# Patient Record
Sex: Female | Born: 1970
Health system: Southern US, Community
[De-identification: ages and names within clinical notes are randomized; demographics above are authoritative.]

## PROBLEM LIST (undated history)

## (undated) DIAGNOSIS — Z8614 Personal history of Methicillin resistant Staphylococcus aureus infection: Secondary | ICD-10-CM

## (undated) DIAGNOSIS — F329 Major depressive disorder, single episode, unspecified: Secondary | ICD-10-CM

## (undated) DIAGNOSIS — N390 Urinary tract infection, site not specified: Secondary | ICD-10-CM

## (undated) DIAGNOSIS — Z889 Allergy status to unspecified drugs, medicaments and biological substances status: Secondary | ICD-10-CM

## (undated) DIAGNOSIS — Q2542 Hypoplasia of aorta: Secondary | ICD-10-CM

## (undated) DIAGNOSIS — J309 Allergic rhinitis, unspecified: Secondary | ICD-10-CM

## (undated) DIAGNOSIS — J189 Pneumonia, unspecified organism: Secondary | ICD-10-CM

## (undated) DIAGNOSIS — J45909 Unspecified asthma, uncomplicated: Secondary | ICD-10-CM

## (undated) DIAGNOSIS — N926 Irregular menstruation, unspecified: Secondary | ICD-10-CM

## (undated) DIAGNOSIS — T4145XA Adverse effect of unspecified anesthetic, initial encounter: Secondary | ICD-10-CM

## (undated) DIAGNOSIS — R7303 Prediabetes: Secondary | ICD-10-CM

## (undated) DIAGNOSIS — T8859XA Other complications of anesthesia, initial encounter: Secondary | ICD-10-CM

## (undated) DIAGNOSIS — R011 Cardiac murmur, unspecified: Secondary | ICD-10-CM

## (undated) DIAGNOSIS — Q210 Ventricular septal defect: Secondary | ICD-10-CM

## (undated) DIAGNOSIS — G43909 Migraine, unspecified, not intractable, without status migrainosus: Secondary | ICD-10-CM

## (undated) DIAGNOSIS — F32A Depression, unspecified: Secondary | ICD-10-CM

## (undated) DIAGNOSIS — Z789 Other specified health status: Secondary | ICD-10-CM

## (undated) DIAGNOSIS — F419 Anxiety disorder, unspecified: Secondary | ICD-10-CM

## (undated) DIAGNOSIS — L500 Allergic urticaria: Secondary | ICD-10-CM

## (undated) DIAGNOSIS — K219 Gastro-esophageal reflux disease without esophagitis: Secondary | ICD-10-CM

## (undated) DIAGNOSIS — R635 Abnormal weight gain: Secondary | ICD-10-CM

## (undated) DIAGNOSIS — C801 Malignant (primary) neoplasm, unspecified: Secondary | ICD-10-CM

## (undated) DIAGNOSIS — E039 Hypothyroidism, unspecified: Secondary | ICD-10-CM

## (undated) DIAGNOSIS — R42 Dizziness and giddiness: Secondary | ICD-10-CM

## (undated) DIAGNOSIS — E663 Overweight: Secondary | ICD-10-CM

## (undated) HISTORY — PX: TONSILLECTOMY: SUR1361

## (undated) HISTORY — PX: CORONARY ANGIOPLASTY: SHX604

## (undated) HISTORY — DX: Hypoplasia of aorta: Q25.42

## (undated) HISTORY — DX: Overweight: E66.3

## (undated) HISTORY — DX: Anxiety disorder, unspecified: F41.9

## (undated) HISTORY — PX: COLONOSCOPY: SHX174

## (undated) HISTORY — DX: Allergic rhinitis, unspecified: J30.9

## (undated) HISTORY — DX: Ventricular septal defect: Q21.0

## (undated) HISTORY — DX: Dizziness and giddiness: R42

## (undated) HISTORY — DX: Depression, unspecified: F32.A

## (undated) HISTORY — DX: Migraine, unspecified, not intractable, without status migrainosus: G43.909

## (undated) HISTORY — DX: Other specified health status: Z78.9

## (undated) HISTORY — PX: NASAL SINUS SURGERY: SHX719

## (undated) HISTORY — DX: Urinary tract infection, site not specified: N39.0

## (undated) HISTORY — DX: Cardiac murmur, unspecified: R01.1

## (undated) HISTORY — DX: Allergic urticaria: L50.0

## (undated) HISTORY — PX: CYSTOSCOPY WITH HYDRODISTENSION AND BIOPSY: SHX5127

## (undated) HISTORY — DX: Irregular menstruation, unspecified: N92.6

## (undated) HISTORY — DX: Abnormal weight gain: R63.5

## (undated) HISTORY — PX: COSMETIC SURGERY: SHX468

## (undated) HISTORY — DX: Gastro-esophageal reflux disease without esophagitis: K21.9

## (undated) HISTORY — PX: ESOPHAGOGASTRODUODENOSCOPY: SHX1529

## (undated) HISTORY — DX: Major depressive disorder, single episode, unspecified: F32.9

## (undated) HISTORY — PX: ABDOMINAL HYSTERECTOMY: SHX81

---

## 1999-03-14 ENCOUNTER — Encounter (HOSPITAL_COMMUNITY): Admission: RE | Admit: 1999-03-14 | Discharge: 1999-06-12 | Payer: Self-pay | Admitting: Neurology

## 2001-05-23 ENCOUNTER — Other Ambulatory Visit: Admission: RE | Admit: 2001-05-23 | Discharge: 2001-05-23 | Payer: Self-pay | Admitting: Obstetrics & Gynecology

## 2001-11-12 HISTORY — PX: TUBAL LIGATION: SHX77

## 2001-11-15 ENCOUNTER — Inpatient Hospital Stay (HOSPITAL_COMMUNITY): Admission: AD | Admit: 2001-11-15 | Discharge: 2001-11-15 | Payer: Self-pay | Admitting: Obstetrics and Gynecology

## 2001-12-04 ENCOUNTER — Inpatient Hospital Stay (HOSPITAL_COMMUNITY): Admission: AD | Admit: 2001-12-04 | Discharge: 2001-12-04 | Payer: Self-pay | Admitting: Obstetrics & Gynecology

## 2001-12-05 ENCOUNTER — Inpatient Hospital Stay (HOSPITAL_COMMUNITY): Admission: AD | Admit: 2001-12-05 | Discharge: 2001-12-05 | Payer: Self-pay | Admitting: *Deleted

## 2001-12-31 ENCOUNTER — Inpatient Hospital Stay (HOSPITAL_COMMUNITY): Admission: AD | Admit: 2001-12-31 | Discharge: 2001-12-31 | Payer: Self-pay | Admitting: *Deleted

## 2002-01-02 ENCOUNTER — Inpatient Hospital Stay (HOSPITAL_COMMUNITY): Admission: AD | Admit: 2002-01-02 | Discharge: 2002-01-05 | Payer: Self-pay | Admitting: Obstetrics and Gynecology

## 2002-01-08 ENCOUNTER — Inpatient Hospital Stay (HOSPITAL_COMMUNITY): Admission: AD | Admit: 2002-01-08 | Discharge: 2002-01-10 | Payer: Self-pay | Admitting: Obstetrics & Gynecology

## 2002-02-02 ENCOUNTER — Other Ambulatory Visit: Admission: RE | Admit: 2002-02-02 | Discharge: 2002-02-02 | Payer: Self-pay | Admitting: Obstetrics and Gynecology

## 2003-11-10 ENCOUNTER — Other Ambulatory Visit: Payer: Self-pay

## 2003-12-04 ENCOUNTER — Other Ambulatory Visit: Payer: Self-pay

## 2004-09-21 ENCOUNTER — Emergency Department: Payer: Self-pay | Admitting: Emergency Medicine

## 2004-11-12 HISTORY — PX: COLONOSCOPY: SHX174

## 2004-12-21 ENCOUNTER — Emergency Department: Payer: Self-pay | Admitting: Emergency Medicine

## 2005-12-26 ENCOUNTER — Emergency Department: Payer: Self-pay | Admitting: Unknown Physician Specialty

## 2006-07-25 ENCOUNTER — Ambulatory Visit: Payer: Self-pay

## 2007-05-11 ENCOUNTER — Emergency Department: Payer: Self-pay | Admitting: Emergency Medicine

## 2007-11-27 ENCOUNTER — Ambulatory Visit: Payer: Self-pay | Admitting: Family Medicine

## 2007-11-28 ENCOUNTER — Emergency Department: Payer: Self-pay | Admitting: Unknown Physician Specialty

## 2007-12-24 ENCOUNTER — Ambulatory Visit: Payer: Self-pay

## 2011-09-29 ENCOUNTER — Emergency Department: Payer: Self-pay | Admitting: Emergency Medicine

## 2011-11-13 HISTORY — PX: CORONARY ANGIOPLASTY: SHX604

## 2012-01-22 ENCOUNTER — Emergency Department: Payer: Self-pay | Admitting: Unknown Physician Specialty

## 2012-01-22 LAB — COMPREHENSIVE METABOLIC PANEL
Albumin: 3.9 g/dL (ref 3.4–5.0)
Anion Gap: 13 (ref 7–16)
BUN: 10 mg/dL (ref 7–18)
Calcium, Total: 8.4 mg/dL — ABNORMAL LOW (ref 8.5–10.1)
Chloride: 104 mmol/L (ref 98–107)
Co2: 22 mmol/L (ref 21–32)
Creatinine: 0.71 mg/dL (ref 0.60–1.30)
EGFR (Non-African Amer.): >60
Glucose: 81 mg/dL (ref 65–99)
Osmolality: 276 (ref 275–301)
Potassium: 4.1 mmol/L (ref 3.5–5.1)
SGPT (ALT): 26 U/L
Sodium: 139 mmol/L (ref 136–145)

## 2012-01-22 LAB — CBC
HCT: 40.1 % (ref 35.0–47.0)
HGB: 13.3 g/dL (ref 12.0–16.0)
MCH: 29.1 pg (ref 26.0–34.0)
MCHC: 33.1 g/dL (ref 32.0–36.0)
MCV: 88 fL (ref 80–100)
RDW: 12.5 % (ref 11.5–14.5)

## 2012-01-22 LAB — CK TOTAL AND CKMB (NOT AT ARMC): CK-MB: 2.4 ng/mL (ref 0.5–3.6)

## 2012-01-22 LAB — PROTIME-INR
INR: 0.9
Prothrombin Time: 12.7 secs (ref 11.5–14.7)

## 2012-08-26 ENCOUNTER — Emergency Department: Payer: Self-pay | Admitting: Emergency Medicine

## 2012-08-26 LAB — COMPREHENSIVE METABOLIC PANEL
Anion Gap: 8 (ref 7–16)
BUN: 10 mg/dL (ref 7–18)
Bilirubin,Total: 0.3 mg/dL (ref 0.2–1.0)
Chloride: 105 mmol/L (ref 98–107)
Creatinine: 0.74 mg/dL (ref 0.60–1.30)
EGFR (African American): >60
Osmolality: 284 (ref 275–301)
Potassium: 3.9 mmol/L (ref 3.5–5.1)
Sodium: 143 mmol/L (ref 136–145)
Total Protein: 7.2 g/dL (ref 6.4–8.2)

## 2012-08-26 LAB — CBC
HCT: 38.4 % (ref 35.0–47.0)
HGB: 13.1 g/dL (ref 12.0–16.0)
MCH: 29.4 pg (ref 26.0–34.0)
MCV: 87 fL (ref 80–100)
Platelet: 307 10*3/uL (ref 150–440)
RBC: 4.44 10*6/uL (ref 3.80–5.20)
WBC: 7.3 10*3/uL (ref 3.6–11.0)

## 2012-08-26 LAB — CK TOTAL AND CKMB (NOT AT ARMC)
CK, Total: 164 U/L (ref 21–215)
CK-MB: 2.2 ng/mL (ref 0.5–3.6)

## 2012-10-27 ENCOUNTER — Emergency Department: Payer: Self-pay | Admitting: Emergency Medicine

## 2012-10-27 LAB — CBC WITH DIFFERENTIAL/PLATELET
Basophil #: 0 10*3/uL (ref 0.0–0.1)
Basophil %: 0.6 %
Eosinophil #: 0.5 10*3/uL (ref 0.0–0.7)
Eosinophil %: 6.9 %
HCT: 39 % (ref 35.0–47.0)
HGB: 13 g/dL (ref 12.0–16.0)
Lymphocyte #: 1.8 10*3/uL (ref 1.0–3.6)
MCH: 29.1 pg (ref 26.0–34.0)
MCHC: 33.4 g/dL (ref 32.0–36.0)
MCV: 87 fL (ref 80–100)
Monocyte #: 0.6 x10 3/mm (ref 0.2–0.9)
Neutrophil #: 4.9 10*3/uL (ref 1.4–6.5)
Neutrophil %: 62.1 %
RDW: 13.2 % (ref 11.5–14.5)

## 2012-10-27 LAB — BASIC METABOLIC PANEL
Anion Gap: 5 — ABNORMAL LOW (ref 7–16)
BUN: 9 mg/dL (ref 7–18)
Calcium, Total: 8.3 mg/dL — ABNORMAL LOW (ref 8.5–10.1)
Co2: 27 mmol/L (ref 21–32)
EGFR (African American): >60
EGFR (Non-African Amer.): >60
Glucose: 81 mg/dL (ref 65–99)
Potassium: 3.8 mmol/L (ref 3.5–5.1)
Sodium: 141 mmol/L (ref 136–145)

## 2012-10-27 LAB — URINALYSIS, COMPLETE
Bilirubin,UR: NEGATIVE
Glucose,UR: NEGATIVE mg/dL (ref 0–75)
Nitrite: NEGATIVE
Protein: NEGATIVE
RBC,UR: 8 /HPF (ref 0–5)
Specific Gravity: 1.015 (ref 1.003–1.030)
Squamous Epithelial: 12
WBC UR: 213 /HPF (ref 0–5)

## 2012-10-27 LAB — TROPONIN I: Troponin-I: <0.02 ng/mL

## 2012-10-29 LAB — URINE CULTURE

## 2013-03-23 ENCOUNTER — Emergency Department: Payer: Self-pay | Admitting: Emergency Medicine

## 2013-03-23 LAB — URINALYSIS, COMPLETE
Bacteria: NONE SEEN
Blood: NEGATIVE
Ketone: NEGATIVE
Leukocyte Esterase: NEGATIVE
Ph: 7 (ref 4.5–8.0)
Protein: NEGATIVE
RBC,UR: 2 /HPF (ref 0–5)
Squamous Epithelial: 1
WBC UR: <1 /HPF (ref 0–5)

## 2013-08-05 DIAGNOSIS — R635 Abnormal weight gain: Secondary | ICD-10-CM

## 2013-08-05 DIAGNOSIS — Q210 Ventricular septal defect: Secondary | ICD-10-CM

## 2013-08-05 HISTORY — DX: Ventricular septal defect: Q21.0

## 2013-08-05 HISTORY — DX: Abnormal weight gain: R63.5

## 2013-08-29 ENCOUNTER — Emergency Department: Payer: Self-pay | Admitting: Internal Medicine

## 2013-09-30 DIAGNOSIS — N390 Urinary tract infection, site not specified: Secondary | ICD-10-CM

## 2013-09-30 DIAGNOSIS — F329 Major depressive disorder, single episode, unspecified: Secondary | ICD-10-CM | POA: Insufficient documentation

## 2013-09-30 DIAGNOSIS — F32A Depression, unspecified: Secondary | ICD-10-CM

## 2013-09-30 DIAGNOSIS — N926 Irregular menstruation, unspecified: Secondary | ICD-10-CM | POA: Insufficient documentation

## 2013-09-30 DIAGNOSIS — E663 Overweight: Secondary | ICD-10-CM

## 2013-09-30 DIAGNOSIS — G43909 Migraine, unspecified, not intractable, without status migrainosus: Secondary | ICD-10-CM

## 2013-09-30 HISTORY — DX: Irregular menstruation, unspecified: N92.6

## 2013-09-30 HISTORY — DX: Depression, unspecified: F32.A

## 2013-09-30 HISTORY — DX: Overweight: E66.3

## 2013-09-30 HISTORY — DX: Urinary tract infection, site not specified: N39.0

## 2013-09-30 HISTORY — DX: Migraine, unspecified, not intractable, without status migrainosus: G43.909

## 2013-11-13 HISTORY — PX: BREAST BIOPSY: SHX20

## 2014-04-11 DIAGNOSIS — Z789 Other specified health status: Secondary | ICD-10-CM

## 2014-04-11 HISTORY — DX: Other specified health status: Z78.9

## 2015-03-11 ENCOUNTER — Other Ambulatory Visit: Payer: Self-pay | Admitting: Nurse Practitioner

## 2015-03-11 DIAGNOSIS — Z1239 Encounter for other screening for malignant neoplasm of breast: Secondary | ICD-10-CM

## 2015-04-04 ENCOUNTER — Ambulatory Visit
Admission: RE | Admit: 2015-04-04 | Discharge: 2015-04-04 | Disposition: A | Discharge status: Home / Self Care | Payer: 59 | Source: Ambulatory Visit | Attending: Nurse Practitioner | Admitting: Nurse Practitioner

## 2015-04-04 ENCOUNTER — Other Ambulatory Visit: Payer: Self-pay | Admitting: Nurse Practitioner

## 2015-04-04 DIAGNOSIS — Z1231 Encounter for screening mammogram for malignant neoplasm of breast: Secondary | ICD-10-CM | POA: Diagnosis present

## 2015-04-04 DIAGNOSIS — Z1239 Encounter for other screening for malignant neoplasm of breast: Secondary | ICD-10-CM

## 2015-04-04 DIAGNOSIS — N63 Unspecified lump in breast: Secondary | ICD-10-CM | POA: Insufficient documentation

## 2015-08-06 ENCOUNTER — Encounter: Payer: Self-pay | Admitting: Emergency Medicine

## 2015-08-06 ENCOUNTER — Emergency Department
Admission: EM | Admit: 2015-08-06 | Discharge: 2015-08-06 | Disposition: A | Discharge status: Home / Self Care | Payer: 59 | Attending: Emergency Medicine | Admitting: Emergency Medicine

## 2015-08-06 DIAGNOSIS — Z880 Allergy status to penicillin: Secondary | ICD-10-CM | POA: Diagnosis not present

## 2015-08-06 DIAGNOSIS — J019 Acute sinusitis, unspecified: Secondary | ICD-10-CM | POA: Diagnosis not present

## 2015-08-06 DIAGNOSIS — Z9109 Other allergy status, other than to drugs and biological substances: Secondary | ICD-10-CM

## 2015-08-06 DIAGNOSIS — J302 Other seasonal allergic rhinitis: Secondary | ICD-10-CM | POA: Diagnosis not present

## 2015-08-06 DIAGNOSIS — H9202 Otalgia, left ear: Secondary | ICD-10-CM

## 2015-08-06 HISTORY — DX: Unspecified asthma, uncomplicated: J45.909

## 2015-08-06 MED ORDER — DOXYCYCLINE HYCLATE 100 MG PO CAPS: 100.0000 mg | ORAL_CAPSULE | Freq: Two times a day (BID) | ORAL | Status: DC

## 2015-08-06 MED ORDER — HYDROCODONE-ACETAMINOPHEN 5-325 MG PO TABS: 1.0000 | ORAL_TABLET | ORAL | Status: DC | PRN

## 2015-08-06 MED ORDER — PREDNISONE 10 MG PO TABS: ORAL_TABLET | ORAL | Status: DC

## 2015-08-06 NOTE — ED Provider Notes (Signed)
[facility] Emergency Department Provider Note  ____________________________________________  Time seen: Approximately 5:59 PM  I have reviewed the triage vital signs and the nursing notes.   HISTORY  Chief Complaint Otalgia  HPI JAYLNN ULLERY is a 44 y.o. female is here with complaint of headache and left ear pain that extends to her mouth and teeth. Patient states she has sinus infection a month and a half ago that was not treated. She states that last week she saw her PCP who told her that it was a virus and did not give her an antibiotic. She did not go back which she did not improve because she is mad at her PCP. She states now her right ureter is starting to hurt and painful touch. She states that she continues to take Allegra-D for allergies which "does not work". She also has Flonase at home which "does not work". She has been to [facility] and "seeing all the doctors there". Did not help her. She states she has had sinus surgeries in the past which also did not help her sinus condition. Currently she rates her pain 9 out of 10 and constant. She states that nothing helps the pain.   Past Medical History  Diagnosis Date  . Asthma     There are no active problems to display for this patient.   Past Surgical History  Procedure Laterality Date  . Breast biopsy Left 11/13/13    benign    Current Outpatient Rx  Name  Route  Sig  Dispense  Refill  . doxycycline (VIBRAMYCIN) 100 MG capsule   Oral   Take 1 capsule (100 mg total) by mouth 2 (two) times daily.   14 capsule   0   . HYDROcodone-acetaminophen (NORCO/VICODIN) 5-325 MG per tablet   Oral   Take 1 tablet by mouth every 4 (four) hours as needed for moderate pain.   20 tablet   0   . predniSONE (DELTASONE) 10 MG tablet      Take 3 tablets once a day for 3 days   9 tablet   0     Allergies Eggs or egg-derived products; Keflex; Milk-related compounds; Penicillins; and Soy  allergy  Family History  Problem Relation Age of Onset  . Breast cancer Maternal Aunt   . Breast cancer Paternal Aunt   . Breast cancer Maternal Grandmother   . Breast cancer Paternal Grandmother   . Breast cancer Paternal Aunt     Social History Social History  Substance Use Topics  . Smoking status: Never Smoker   . Smokeless tobacco: None  . Alcohol Use: Yes     Comment: ocassionally    Review of Systems Constitutional: No fever/chills Eyes: No visual changes. ENT: No sore throat. Positive for sinus pain Cardiovascular: Denies chest pain. Respiratory: Denies shortness of breath. Gastrointestinal:   No nausea, no vomiting.  Genitourinary: Negative for dysuria. Musculoskeletal: Negative for back pain. Skin: Negative for rash. Neurological: Positive for headaches, no focal weakness or numbness.  10-point ROS otherwise negative.  ____________________________________________   PHYSICAL EXAM:  VITAL SIGNS: ED Triage Vitals  Enc Vitals Group     BP 08/06/15 1715 128/84 mmHg     Pulse Rate 08/06/15 1715 84     Resp 08/06/15 1715 16     Temp 08/06/15 1715 97.8 F (36.6 C)     Temp Source 08/06/15 1715 Oral     SpO2 08/06/15 1715 97 %     Weight 08/06/15  1715 143 lb (64.864 kg)     Height 08/06/15 1715 5' 2.5" (1.588 m)     Head Cir --      Peak Flow --      Pain Score 08/06/15 1716 9     Pain Loc --      Pain Edu? --      Excl. in [place]? --     Constitutional: Alert and oriented. Well appearing and in no acute distress. Patient was currently talking on the phone at first visit in the exam room. Eyes: Conjunctivae are normal. PERRL. EOMI. Head: Atraumatic. Moderate tenderness on percussion of sinuses frontal and maxillary bilaterally Nose: No congestion/rhinnorhea. Mildly swollen turbinates but no erythema. EACs clear and TMs clear bilaterally Mouth/Throat: Mucous membranes are moist.  Oropharynx non-erythematous. Neck: No stridor.    Hematological/Lymphatic/Immunilogical: No cervical lymphadenopathy. Cardiovascular: Normal rate, regular rhythm. Grossly normal heart sounds.  Good peripheral circulation. Respiratory: Normal respiratory effort.  No retractions. Lungs CTAB. Gastrointestinal: Soft and nontender. No distention.  Musculoskeletal: No lower extremity tenderness nor edema.  No joint effusions. Neurologic:  Normal speech and language. No gross focal neurologic deficits are appreciated. No gait instability. Skin:  Skin is warm, dry and intact. No rash noted. Psychiatric: Mood and affect are normal. Speech and behavior are normal.  ____________________________________________   LABS (all labs ordered are listed, but only abnormal results are displayed)  Labs Reviewed - No data to display  PROCEDURES  Procedure(s) performed: None  Critical Care performed: No  ____________________________________________   INITIAL IMPRESSION / ASSESSMENT AND PLAN / ED COURSE  Pertinent labs & imaging results that were available during my care of the patient were reviewed by me and considered in my medical decision making (see chart for details).  She was placed on doxycycline twice a day for 7 days, Norco as needed for pain, and prednisone 30 mg for 3 days. She is encouraged to continue using her Allegra-D and began using the Flonase that she has a home. She is to follow-up with her PCP or return to [facility] ____________________________________________   FINAL CLINICAL IMPRESSION(S) / ED DIAGNOSES  Final diagnoses:  Acute sinusitis, recurrence not specified, unspecified location  Acute otalgia, left  Environmental allergies      Johnn Hai, PA-C 08/06/15 1854  Lavonia Drafts, MD 08/06/15 2351

## 2015-08-06 NOTE — ED Notes (Addendum)
C/o headache and left ear pain that extends to mouth and teeth.  Pt states she had sinus infection a month and half ago but was not treated. Pt states right ear is starting to hurt. Ear is painful to touch on the outside. Patient states she has been having trouble breathing, but thinks it may be her nerves. Patient appears to be in NAD. Pt has tried home remedies but has not had any relief.  Patient states she just feels "weird" C/o of left sided head pain.

## 2015-08-06 NOTE — ED Notes (Signed)
Patient presents to the ED with left ear pain and left facial pain for over 2 weeks.  Patient is in no obvious distress at this time.

## 2015-08-06 NOTE — Discharge Instructions (Signed)
FOLLOW UP WITH YOUR DOCTOR IF NOT IMPROVING TAKE MEDICATIONS AS DIRECTED

## 2015-11-13 DIAGNOSIS — Z8614 Personal history of Methicillin resistant Staphylococcus aureus infection: Secondary | ICD-10-CM

## 2015-11-13 HISTORY — DX: Personal history of Methicillin resistant Staphylococcus aureus infection: Z86.14

## 2016-02-09 ENCOUNTER — Other Ambulatory Visit: Payer: Self-pay | Admitting: Physician Assistant

## 2016-02-09 DIAGNOSIS — L500 Allergic urticaria: Secondary | ICD-10-CM

## 2016-02-09 DIAGNOSIS — Z1231 Encounter for screening mammogram for malignant neoplasm of breast: Secondary | ICD-10-CM

## 2016-02-09 HISTORY — DX: Allergic urticaria: L50.0

## 2016-02-13 ENCOUNTER — Emergency Department
Admission: EM | Admit: 2016-02-13 | Discharge: 2016-02-13 | Disposition: A | Discharge status: Home / Self Care | Payer: 59 | Attending: Emergency Medicine | Admitting: Emergency Medicine

## 2016-02-13 ENCOUNTER — Encounter: Payer: Self-pay | Admitting: Emergency Medicine

## 2016-02-13 DIAGNOSIS — T486X5A Adverse effect of antiasthmatics, initial encounter: Secondary | ICD-10-CM | POA: Insufficient documentation

## 2016-02-13 DIAGNOSIS — R1310 Dysphagia, unspecified: Secondary | ICD-10-CM | POA: Insufficient documentation

## 2016-02-13 DIAGNOSIS — J45909 Unspecified asthma, uncomplicated: Secondary | ICD-10-CM | POA: Diagnosis not present

## 2016-02-13 DIAGNOSIS — Z792 Long term (current) use of antibiotics: Secondary | ICD-10-CM | POA: Insufficient documentation

## 2016-02-13 DIAGNOSIS — T7840XA Allergy, unspecified, initial encounter: Secondary | ICD-10-CM

## 2016-02-13 DIAGNOSIS — Z7952 Long term (current) use of systemic steroids: Secondary | ICD-10-CM | POA: Diagnosis not present

## 2016-02-13 MED ORDER — DIPHENHYDRAMINE HCL 50 MG/ML IJ SOLN
50.0000 mg | Freq: Once | INTRAMUSCULAR | Status: AC
  Administered 2016-02-13: 50 mg via INTRAVENOUS
  Filled 2016-02-13: qty 1

## 2016-02-13 MED ORDER — LORAZEPAM 2 MG/ML IJ SOLN
1.0000 mg | Freq: Once | INTRAMUSCULAR | Status: AC
  Administered 2016-02-13: 1 mg via INTRAVENOUS
  Filled 2016-02-13: qty 1

## 2016-02-13 MED ORDER — EPINEPHRINE HCL 0.1 MG/ML IJ SOSY
0.3000 mg | PREFILLED_SYRINGE | Freq: Once | INTRAMUSCULAR | Status: AC
  Administered 2016-02-13: 0.3 mg via INTRAVENOUS
  Filled 2016-02-13: qty 10

## 2016-02-13 MED ORDER — RANITIDINE HCL 150 MG/10ML PO SYRP
150.0000 mg | ORAL_SOLUTION | Freq: Once | ORAL | Status: AC
  Administered 2016-02-13: 150 mg via ORAL
  Filled 2016-02-13: qty 10

## 2016-02-13 MED ORDER — PREDNISONE 20 MG PO TABS: 40.0000 mg | ORAL_TABLET | Freq: Every day | ORAL | Status: DC

## 2016-02-13 MED ORDER — EPINEPHRINE 0.3 MG/0.3ML IJ SOAJ: 0.3000 mg | Freq: Once | INTRAMUSCULAR | Status: AC

## 2016-02-13 MED ORDER — METHYLPREDNISOLONE SODIUM SUCC 125 MG IJ SOLR
125.0000 mg | Freq: Once | INTRAMUSCULAR | Status: AC
  Administered 2016-02-13: 125 mg via INTRAVENOUS
  Filled 2016-02-13: qty 2

## 2016-02-13 MED ORDER — SODIUM CHLORIDE 0.9 % IV BOLUS (SEPSIS)
1000.0000 mL | Freq: Once | INTRAVENOUS | Status: AC
  Administered 2016-02-13: 1000 mL via INTRAVENOUS

## 2016-02-13 NOTE — Discharge Instructions (Signed)
Please seek medical attention for any high fevers, chest pain, shortness of breath, change in behavior, persistent vomiting, bloody stool or any other new or concerning symptoms.   Allergies An allergy is when your body reacts to a substance in a way that is not normal. An allergic reaction can happen after you:  Eat something.  Breathe in something.  Touch something. WHAT KINDS OF ALLERGIES ARE THERE? You can be allergic to:  Things that are only around during certain seasons, like molds and pollens.  Foods.  Drugs.  Insects.  Animal dander. WHAT ARE SYMPTOMS OF ALLERGIES?  Puffiness (swelling). This may happen on the lips, face, tongue, mouth, or throat.  Sneezing.  Coughing.  Breathing loudly (wheezing).  Stuffy nose.  Tingling in the mouth.  A rash.  Itching.  Itchy, red, puffy areas of skin (hives).  Watery eyes.  Throwing up (vomiting).  Watery poop (diarrhea).  Dizziness.  Feeling faint or fainting.  Trouble breathing or swallowing.  A tight feeling in the chest.  A fast heartbeat. HOW ARE ALLERGIES DIAGNOSED? Allergies can be diagnosed with:  A medical and family history.  Skin tests.  Blood tests.  A food diary. A food diary is a record of all the foods, drinks, and symptoms you have each day.  The results of an elimination diet. This diet involves making sure not to eat certain foods and then seeing what happens when you start eating them again. HOW ARE ALLERGIES TREATED? There is no cure for allergies, but allergic reactions can be treated with medicine. Severe reactions usually need to be treated at a hospital.  HOW CAN REACTIONS BE PREVENTED? The best way to prevent an allergic reaction is to avoid the thing you are allergic to. Allergy shots and medicines can also help prevent reactions in some cases.   This information is not intended to replace advice given to you by your health care provider. Make sure you discuss any  questions you have with your health care provider.   Document Released: 02/23/2013 Document Revised: 11/19/2014 Document Reviewed: 08/10/2014 Elsevier Interactive Patient Education [employer].

## 2016-02-13 NOTE — ED Provider Notes (Signed)
[facility] Emergency Department Provider Note   ____________________________________________  Time seen: ~1725  I have reviewed the triage vital signs and the nursing notes.   HISTORY  Chief Complaint Allergic Reaction   History limited by: Not Limited   HPI Kristine [name] is a 45 y.o. female who presents to the emergency department today because of concerns for allergic reaction. The patient states that the symptoms started roughly 1 hour ago. She thinks she might of eating something. She does have multiple food allergies and is being seen both by dermatology and ENT. She is being referred to a allergy specialist. Patient states that she is having full body itching. It is severe. In addition she is having some difficulty with breathing. The patient apparently refuses to carry an EpiPen given fear of needles. She states that she took Benadryl and her other antihistamines early this morning. Denies any recent fevers.     Past Medical History  Diagnosis Date  . Asthma     There are no active problems to display for this patient.   Past Surgical History  Procedure Laterality Date  . Breast biopsy Left 11/13/13    benign    Current Outpatient Rx  Name  Route  Sig  Dispense  Refill  . doxycycline (VIBRAMYCIN) 100 MG capsule   Oral   Take 1 capsule (100 mg total) by mouth 2 (two) times daily.   14 capsule   0   . HYDROcodone-acetaminophen (NORCO/VICODIN) 5-325 MG per tablet   Oral   Take 1 tablet by mouth every 4 (four) hours as needed for moderate pain.   20 tablet   0   . predniSONE (DELTASONE) 10 MG tablet      Take 3 tablets once a day for 3 days   9 tablet   0     Allergies Eggs or egg-derived products; Keflex; Milk-related compounds; Penicillins; and Soy allergy  Family History  Problem Relation Age of Onset  . Breast cancer Maternal Aunt   . Breast cancer Paternal Aunt   . Breast cancer Maternal Grandmother   . Breast  cancer Paternal Grandmother   . Breast cancer Paternal Aunt     Social History Social History  Substance Use Topics  . Smoking status: Never Smoker   . Smokeless tobacco: None  . Alcohol Use: Yes     Comment: ocassionally    Review of Systems  Constitutional: Negative for fever. Cardiovascular: Negative for chest pain. Respiratory: Positive for shortness of breath Gastrointestinal: Negative for abdominal pain, vomiting and diarrhea. Neurological: Negative for headaches, focal weakness or numbness.  10-point ROS otherwise negative.  ____________________________________________   PHYSICAL EXAM:  VITAL SIGNS: ED Triage Vitals  Enc Vitals Group     BP 02/13/16 1718 129/75 mmHg     Pulse Rate 02/13/16 1718 81     Resp 02/13/16 1718 20     Temp 02/13/16 1718 98.6 F (37 C)     Temp Source 02/13/16 1718 Oral     SpO2 02/13/16 1718 99 %     Weight 02/13/16 1718 150 lb (68.04 kg)     Height 02/13/16 1718 5\' 2"  (1.575 m)   Constitutional: Alert and oriented. Slightly anxious. Eyes: Conjunctivae are normal. PERRL. Normal extraocular movements. ENT   Head: Normocephalic and atraumatic.   Nose: No congestion/rhinnorhea.   Mouth/Throat: Mucous membranes are moist.   Neck: No stridor. Hematological/Lymphatic/Immunilogical: No cervical lymphadenopathy. Cardiovascular: Normal rate, regular rhythm.  No murmurs, rubs, or gallops.  Respiratory: Normal respiratory effort without tachypnea nor retractions. Breath sounds are clear and equal bilaterally. No wheezes/rales/rhonchi. Gastrointestinal: Soft and nontender. No distention. There is no CVA tenderness. Genitourinary: Deferred Musculoskeletal: Normal range of motion in all extremities. No joint effusions.  No lower extremity tenderness nor edema. Neurologic:  Normal speech and language. No gross focal neurologic deficits are appreciated.  Skin:  Skin is warm, dry and intact. No rash noted. Psychiatric: Slightly  anxious appearing  ____________________________________________    LABS (pertinent positives/negatives)  None  ____________________________________________   EKG  None  ____________________________________________    RADIOLOGY  None  ____________________________________________   PROCEDURES  Procedure(s) performed: None  Critical Care performed: No  ____________________________________________   INITIAL IMPRESSION / ASSESSMENT AND PLAN / ED COURSE  Pertinent labs & imaging results that were available during my care of the patient were reviewed by me and considered in my medical decision making (see chart for details).  Patient presented to the emergency department today because of concerns for acute allergic reaction. Patient states that she has multiple allergies and is being seen by ENT and dermatology. On exam no obvious hives. Patient appears slightly anxious but not in any respiratory distress. Was given epinephrine, steroids and Benadryl. Patient was observed for a number of hours without any worsening of symptoms. Will discharge home on prednisone and given EpiPen prescription.  ____________________________________________   FINAL CLINICAL IMPRESSION(S) / ED DIAGNOSES  Final diagnoses:  Allergy, initial encounter     Nance Pear, MD 02/13/16 2332

## 2016-02-13 NOTE — ED Notes (Signed)
Family reports pt with entire body rash x2 months; has been going to ENT for allergy issues. Pt reports difficulty swallowing and breathing, has been taking albuterol inhaler tx since then. Pt tearful in triage, "combative with needles", reports she won't use an epipen.

## 2016-04-04 ENCOUNTER — Ambulatory Visit: Payer: 59

## 2016-04-11 ENCOUNTER — Emergency Department
Admission: EM | Admit: 2016-04-11 | Discharge: 2016-04-11 | Disposition: A | Discharge status: Home / Self Care | Payer: 59 | Attending: Emergency Medicine | Admitting: Emergency Medicine

## 2016-04-11 ENCOUNTER — Encounter: Payer: Self-pay | Admitting: Medical Oncology

## 2016-04-11 DIAGNOSIS — R21 Rash and other nonspecific skin eruption: Secondary | ICD-10-CM | POA: Diagnosis not present

## 2016-04-11 DIAGNOSIS — Z7951 Long term (current) use of inhaled steroids: Secondary | ICD-10-CM | POA: Insufficient documentation

## 2016-04-11 DIAGNOSIS — J45909 Unspecified asthma, uncomplicated: Secondary | ICD-10-CM | POA: Insufficient documentation

## 2016-04-11 DIAGNOSIS — T7840XA Allergy, unspecified, initial encounter: Secondary | ICD-10-CM | POA: Diagnosis present

## 2016-04-11 DIAGNOSIS — L299 Pruritus, unspecified: Secondary | ICD-10-CM

## 2016-04-11 DIAGNOSIS — L509 Urticaria, unspecified: Secondary | ICD-10-CM | POA: Diagnosis not present

## 2016-04-11 DIAGNOSIS — Z79899 Other long term (current) drug therapy: Secondary | ICD-10-CM | POA: Insufficient documentation

## 2016-04-11 LAB — CBC WITH DIFFERENTIAL/PLATELET
Basophils Absolute: 0 10*3/uL (ref 0–0.1)
Basophils Relative: 0 %
EOS ABS: 0.6 10*3/uL (ref 0–0.7)
Eosinophils Relative: 6 %
HEMATOCRIT: 41.9 % (ref 35.0–47.0)
Hemoglobin: 13.6 g/dL (ref 12.0–16.0)
LYMPHS ABS: 0.5 10*3/uL — AB (ref 1.0–3.6)
Lymphocytes Relative: 5 %
MCH: 27.7 pg (ref 26.0–34.0)
MCHC: 32.5 g/dL (ref 32.0–36.0)
MCV: 85.4 fL (ref 80.0–100.0)
Monocytes Absolute: 0.4 10*3/uL (ref 0.2–0.9)
NEUTROS ABS: 8.1 10*3/uL — AB (ref 1.4–6.5)
Platelets: 339 10*3/uL (ref 150–440)
RBC: 4.9 MIL/uL (ref 3.80–5.20)
RDW: 14 % (ref 11.5–14.5)
WBC: 9.6 10*3/uL (ref 3.6–11.0)

## 2016-04-11 LAB — COMPREHENSIVE METABOLIC PANEL
ALK PHOS: 37 U/L — AB (ref 38–126)
ALT: 33 U/L (ref 14–54)
ANION GAP: 8 (ref 5–15)
AST: 26 U/L (ref 15–41)
Albumin: 3.7 g/dL (ref 3.5–5.0)
BUN: 17 mg/dL (ref 6–20)
CALCIUM: 8 mg/dL — AB (ref 8.9–10.3)
CO2: 27 mmol/L (ref 22–32)
Chloride: 104 mmol/L (ref 101–111)
Creatinine, Ser: 0.64 mg/dL (ref 0.44–1.00)
Glucose, Bld: 122 mg/dL — ABNORMAL HIGH (ref 65–99)
Potassium: 3.8 mmol/L (ref 3.5–5.1)
SODIUM: 139 mmol/L (ref 135–145)
TOTAL PROTEIN: 6.2 g/dL — AB (ref 6.5–8.1)
Total Bilirubin: 0.7 mg/dL (ref 0.3–1.2)

## 2016-04-11 MED ORDER — SODIUM CHLORIDE 0.9 % IV SOLN
Freq: Once | INTRAVENOUS | Status: AC
  Administered 2016-04-11: 12:00:00 via INTRAVENOUS

## 2016-04-11 MED ORDER — LORAZEPAM 2 MG/ML IJ SOLN
1.0000 mg | Freq: Once | INTRAMUSCULAR | Status: AC
  Administered 2016-04-11: 1 mg via INTRAVENOUS
  Filled 2016-04-11: qty 1

## 2016-04-11 MED ORDER — DIPHENHYDRAMINE HCL 50 MG/ML IJ SOLN
INTRAMUSCULAR | Status: AC
  Administered 2016-04-11: 50 mg via INTRAVENOUS
  Filled 2016-04-11: qty 1

## 2016-04-11 MED ORDER — DIPHENHYDRAMINE HCL 50 MG/ML IJ SOLN
50.0000 mg | Freq: Once | INTRAMUSCULAR | Status: AC
  Administered 2016-04-11: 50 mg via INTRAVENOUS
  Filled 2016-04-11: qty 1

## 2016-04-11 MED ORDER — LORAZEPAM 1 MG PO TABS: 1.0000 mg | ORAL_TABLET | Freq: Two times a day (BID) | ORAL | Status: DC

## 2016-04-11 MED ORDER — PREDNISONE 20 MG PO TABS
60.0000 mg | ORAL_TABLET | Freq: Every day | ORAL | Status: DC
Start: 2016-04-11 — End: 2016-08-16

## 2016-04-11 MED ORDER — SODIUM CHLORIDE 0.9 % IV SOLN
250.0000 mg | Freq: Once | INTRAVENOUS | Status: AC
  Administered 2016-04-11: 250 mg via INTRAVENOUS
  Filled 2016-04-11: qty 2

## 2016-04-11 MED ORDER — PENTAFLUOROPROP-TETRAFLUOROETH EX AERO
INHALATION_SPRAY | CUTANEOUS | Status: AC
  Filled 2016-04-11: qty 30

## 2016-04-11 NOTE — Discharge Instructions (Signed)
Rash A rash is a change in the color or texture of the skin. There are many different types of rashes. You may have other problems that accompany your rash. CAUSES   Infections.  Allergic reactions. This can include allergies to pets or foods.  Certain medicines.  Exposure to certain chemicals, soaps, or cosmetics.  Heat.  Exposure to poisonous plants.  Tumors, both cancerous and noncancerous. SYMPTOMS   Redness.  Scaly skin.  Itchy skin.  Dry or cracked skin.  Bumps.  Blisters.  Pain. DIAGNOSIS  Your caregiver may do a physical exam to determine what type of rash you have. A skin sample (biopsy) may be taken and examined under a microscope. TREATMENT  Treatment depends on the type of rash you have. Your caregiver may prescribe certain medicines. For serious conditions, you may need to see a skin doctor (dermatologist). HOME CARE INSTRUCTIONS   Avoid the substance that caused your rash.  Do not scratch your rash. This can cause infection.  You may take cool baths to help stop itching.  Only take over-the-counter or prescription medicines as directed by your caregiver.  Keep all follow-up appointments as directed by your caregiver. SEEK IMMEDIATE MEDICAL CARE IF:  You have increasing pain, swelling, or redness.  You have a fever.  You have new or severe symptoms.  You have body aches, diarrhea, or vomiting.  Your rash is not better after 3 days. MAKE SURE YOU:  Understand these instructions.  Will watch your condition.  Will get help right away if you are not doing well or get worse.   This information is not intended to replace advice given to you by your health care provider. Make sure you discuss any questions you have with your health care provider.   Document Released: 10/19/2002 Document Revised: 11/19/2014 Document Reviewed: 03/16/2015 Elsevier Interactive Patient Education 2016 Elsevier Inc[employer]  Pruritus Pruritus is an itching feeling.  There are many different conditions and factors that can make your skin itchy. Dry skin is one of the most common causes of itching. Most cases of itching do not require medical attention. Itchy skin can turn into a rash.  HOME CARE INSTRUCTIONS  Watch your pruritus for any changes. Take these steps to help with your condition:  Skin Care  Moisturize your skin as needed. A moisturizer that contains petroleum jelly is best for keeping moisture in your skin.  Take or apply medicines only as directed by your health care provider. This may include:  Corticosteroid cream.  Anti-itch lotions.  Oral anti-histamines.  Apply cool compresses to the affected areas.  Try taking a bath with:  Epsom salts. Follow the instructions on the packaging. You can get these at your local pharmacy or grocery store.  Baking soda. Pour a small amount into the bath as directed by your health care provider.  Colloidal oatmeal. Follow the instructions on the packaging. You can get this at your local pharmacy or grocery store.  Try applying baking soda paste to your skin. Stir water into baking soda until it reaches a paste-like consistency.   Do not scratch your skin.  Avoid hot showers or baths, which can make itching worse. A cold shower may help with itching as long as you use a moisturizer after.  Avoid scented soaps, detergents, and perfumes. Use gentle soaps, detergents, perfumes, and other cosmetic products. General Instructions  Avoid wearing tight clothes.  Keep a journal to help track what causes your itch. Write down:  What you eat.  What cosmetic products you use.  What you drink.  What you wear. This includes jewelry.  Use a humidifier. This keeps the air moist, which helps to prevent dry skin. SEEK MEDICAL CARE IF:  The itching does not go away after several days.  You sweat at night.  You have weight loss.  You are unusually thirsty.  You urinate more than normal.  You  are more tired than normal.  You have abdominal pain.  Your skin tingles.  You feel weak.  Your skin or the whites of your eyes look yellow (jaundice).  Your skin feels numb.   This information is not intended to replace advice given to you by your health care provider. Make sure you discuss any questions you have with your health care provider.   Document Released: 07/11/2011 Document Revised: 03/15/2015 Document Reviewed: 10/25/2014 Elsevier Interactive Patient Education [employer].

## 2016-04-11 NOTE — ED Provider Notes (Signed)
[facility] Emergency Department Provider Note        Time seen: ----------------------------------------- 11:21 AM on 04/11/2016 -----------------------------------------    I have reviewed the triage vital signs and the nursing notes.   HISTORY  Chief Complaint Allergic Reaction    HPI Kristine [name] is a 45 y.o. female with a long history of allergies who presents to ER with hives and itching and feeling like her tongue is beginning to swell as well. She also complains of shortness of breath. Patient has been having persistent allergy problems since August of last year. She currently takes significant amount of antihistamines and prednisone daily. She denies any other recent illness or complaints. She states she has not eaten anything unusual   Past Medical History  Diagnosis Date  . Asthma     There are no active problems to display for this patient.   Past Surgical History  Procedure Laterality Date  . Breast biopsy Left 11/13/13    benign    Allergies Eggs or egg-derived products; Keflex; Milk-related compounds; Penicillins; and Soy allergy  Social History Social History  Substance Use Topics  . Smoking status: Never Smoker   . Smokeless tobacco: None  . Alcohol Use: Yes     Comment: ocassionally    Review of Systems Constitutional: Negative for fever. Eyes: Negative for visual changes. ENT: Negative for sore throat.Positive for tongue swelling Cardiovascular: Negative for chest pain. Respiratory: Positive shortness of breath Gastrointestinal: Negative for abdominal pain, vomiting and diarrhea. Genitourinary: Negative for dysuria. Musculoskeletal: Negative for back pain. Skin: Positive for rash Neurological: Negative for headaches, focal weakness or numbness.  10-point ROS otherwise negative.  ____________________________________________   PHYSICAL EXAM:  VITAL SIGNS: ED Triage Vitals  Enc Vitals Group     BP  04/11/16 1106 130/57 mmHg     Pulse Rate 04/11/16 1106 87     Resp 04/11/16 1106 20     Temp 04/11/16 1106 98.4 F (36.9 C)     Temp Source 04/11/16 1106 Oral     SpO2 04/11/16 1106 97 %     Weight 04/11/16 1106 155 lb (70.308 kg)     Height 04/11/16 1106 5\' 2"  (1.575 m)     Head Cir --      Peak Flow --      Pain Score --      Pain Loc --      Pain Edu? --      Excl. in [place]? --     Constitutional: Alert and oriented. Anxious, mild distress Eyes: Conjunctivae are normal. PERRL. Normal extraocular movements. ENT   Head: Normocephalic and atraumatic.   Nose: No congestion/rhinnorhea.   Mouth/Throat: Mucous membranes are moist. No tongue edema is appreciated   Neck: No stridor. Cardiovascular: Normal rate, regular rhythm. No murmurs, rubs, or gallops. Respiratory: Normal respiratory effort without tachypnea nor retractions. Breath sounds are clear and equal bilaterally. No wheezes/rales/rhonchi. Musculoskeletal: Nontender with normal range of motion in all extremities. No lower extremity tenderness nor edema. Neurologic:  Normal speech and language. No gross focal neurologic deficits are appreciated.  Skin:  Generalized maculopapular lesions over her trunk and extremities. Questionable hives are noted Psychiatric: Anxious mood and affect ____________________________________________  ED COURSE:  Pertinent labs & imaging results that were available during my care of the patient were reviewed by me and considered in my medical decision making (see chart for details). Patient presents to ER with acute on chronic skin allergies. We will give IV fluids,  antihistamines and steroids. ____________________________________________    LABS (pertinent positives/negatives)  Labs Reviewed  CBC WITH DIFFERENTIAL/PLATELET - Abnormal; Notable for the following:    Neutro Abs 8.1 (*)    Lymphs Abs 0.5 (*)    All other components within normal limits  COMPREHENSIVE METABOLIC PANEL     ____________________________________________  FINAL ASSESSMENT AND PLAN  Rash, hives  Plan: Patient with labs as dictated above. No clear etiology for her rash. She received IV doses of Benadryl 2, Solu-Medrol and Ativan. Patient states she's feeling more relaxed. Begin a do not appreciate any intraoral or pharyngeal swelling. She'll be discharged with a higher dose of steroids until she can see her dermatologist.   Earleen Newport, MD   Note: This dictation was prepared with Dragon dictation. Any transcriptional errors that result from this process are unintentional   Earleen Newport, MD 04/11/16 [phone]

## 2016-04-11 NOTE — ED Notes (Signed)
Pt reports she began having allergic reaction this am with hives and itching and feels like her tongue is beginning to swell. Pt c/o sob.

## 2016-04-11 NOTE — ED Notes (Signed)
Pt verbalized having increasing tongue swelling. MD made aware. Airway is patent and no increased difficulty breathing.

## 2016-04-11 NOTE — ED Notes (Signed)
Dr. Jimmye Norman is aware that patient is driving self and agrees with the patient that she is able to drive home. Patient states Benadryl has an adverse effect on her and makes her anxious rather than sleepy.

## 2016-04-16 ENCOUNTER — Ambulatory Visit
Admission: RE | Admit: 2016-04-16 | Discharge: 2016-04-16 | Disposition: A | Discharge status: Home / Self Care | Payer: 59 | Source: Ambulatory Visit | Attending: Physician Assistant | Admitting: Physician Assistant

## 2016-04-16 DIAGNOSIS — Z1231 Encounter for screening mammogram for malignant neoplasm of breast: Secondary | ICD-10-CM

## 2016-04-17 ENCOUNTER — Ambulatory Visit (INDEPENDENT_AMBULATORY_CARE_PROVIDER_SITE_OTHER): Payer: 59 | Admitting: Urology

## 2016-04-17 ENCOUNTER — Encounter: Payer: Self-pay | Admitting: Urology

## 2016-04-17 VITALS — BP 133/77 | HR 88 | Ht 62.0 in | Wt 154.0 lb

## 2016-04-17 DIAGNOSIS — J309 Allergic rhinitis, unspecified: Secondary | ICD-10-CM

## 2016-04-17 DIAGNOSIS — R350 Frequency of micturition: Secondary | ICD-10-CM | POA: Diagnosis not present

## 2016-04-17 DIAGNOSIS — R351 Nocturia: Secondary | ICD-10-CM | POA: Diagnosis not present

## 2016-04-17 DIAGNOSIS — N390 Urinary tract infection, site not specified: Secondary | ICD-10-CM | POA: Diagnosis not present

## 2016-04-17 DIAGNOSIS — R310 Gross hematuria: Secondary | ICD-10-CM

## 2016-04-17 HISTORY — DX: Allergic rhinitis, unspecified: J30.9

## 2016-04-17 LAB — URINALYSIS, COMPLETE
BILIRUBIN UA: NEGATIVE
Glucose, UA: NEGATIVE
Ketones, UA: NEGATIVE
Leukocytes, UA: NEGATIVE
NITRITE UA: NEGATIVE
PH UA: 6.5 (ref 5.0–7.5)
PROTEIN UA: NEGATIVE
Specific Gravity, UA: 1.01 (ref 1.005–1.030)
UUROB: 0.2 mg/dL (ref 0.2–1.0)

## 2016-04-17 LAB — MICROSCOPIC EXAMINATION
BACTERIA UA: NONE SEEN
WBC UA: NONE SEEN /HPF (ref 0–?)

## 2016-04-17 NOTE — Progress Notes (Signed)
04/17/2016 9:57 PM   Kristine [name] 1971-03-28 [id number]  Referring provider: Sallee Lange, NP [facility] [place], [place] [address]  Chief Complaint  Patient presents with  . New Patient (Initial Visit)    recurrent UTI    HPI: Patient is a 45 year old Caucasian female who is referred to [place] by [name], PA, for hematuria, recurrent urinary tract infections and incontinence.  Patient states that since she started the medication Plaquenil last August, she has been experiencing urinary frequency, urgency, nocturia, incontinence, gross hematuria and recurrent urinary tract infections.  She states that her daytime frequency is so numerous that she cannot count how many times she uses a restroom.  She states that some nights she experiences nocturia 23.  She is experiencing both stress and urge incontinence.  His were 1-2 sanitary napkin's daily.  She states that she had her bladder stretched by Dr. Eliberto Ivory 2003.  I do not have any documented urinary tract infections.  She states that she just has antibiotics called in when she is experiencing infections.  Her UA today is unremarkable.  She does not have a family medical history of nephrolithiasis or hematuria.  Her grandfather was diagnosed with prostate cancer and her uncle was diagnosed with kidney cancer.  She is not experiencing any suprapubic pain, abdominal pain or flank pain. She denies any recent fevers, chills, nausea or vomiting.   She has not had any recent imaging studies.   She is a former smoker, with a 1ppd history.  Quit 8 years ago.  She is exposed to secondhand smoke.  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    PMH: Past Medical History  Diagnosis Date  . Asthma   . Headache, migraine 09/30/2013  . Absence of interventricular septum 08/05/2013  . Allergic rhinitis 04/17/2016  . Allergic urticaria due to ingested food 02/09/2016  . Infection of  urinary tract 09/30/2013  . Irregular bleeding 09/30/2013  . Heavy drinker ([place]) 04/11/2014    Overview:  Patient reports drinking 2-3 bottles of wine each weekend.    . Excess weight 09/30/2013  . Clinical depression 09/30/2013  . Abnormal weight gain 08/05/2013    Surgical History: Past Surgical History  Procedure Laterality Date  . Breast biopsy Left 11/13/13    benign  . Coronary angioplasty    . Nasal sinus surgery      x12  . Colonoscopy    . Cystoscopy with hydrodistension and biopsy      Home Medications:    Medication List       This list is accurate as of: 04/17/16 11:59 PM.  Always use your most recent med list.               azelastine 0.05 % ophthalmic solution  Commonly known as:  OPTIVAR  Place 1 drop into both eyes 2 (two) times daily.     Azelastine HCl 0.15 % Soln  Place 1 spray into the nose daily.     Calcium Carb-Ergocalciferol 500-200 MG-UNIT Tabs  Take 1 tablet by mouth 2 (two) times daily.     cetirizine 10 MG tablet  Commonly known as:  ZYRTEC  Take 1 tablet by mouth daily.     EPINEPHrine 0.3 mg/0.3 mL Soaj injection  Commonly known as:  EPI-PEN  Inject 0.3 mLs (0.3 mg total) into the muscle once.     fexofenadine-pseudoephedrine 180-240 MG 24 hr tablet  Commonly known as:  ALLEGRA-D 24  Take 1 tablet by mouth daily.     fluticasone 50 MCG/ACT nasal spray  Commonly known as:  FLONASE  Place 1 spray into both nostrils daily.     hydrochlorothiazide 12.5 MG tablet  Commonly known as:  HYDRODIURIL  Take 12.5 mg by mouth daily.     hydroxychloroquine 200 MG tablet  Commonly known as:  PLAQUENIL  Take 200 mg by mouth 2 (two) times daily.     hydrOXYzine 25 MG tablet  Commonly known as:  ATARAX/VISTARIL  Take 25 mg by mouth daily.     LORazepam 1 MG tablet  Commonly known as:  ATIVAN  Take 1 tablet (1 mg total) by mouth 2 (two) times daily.     nitrofurantoin 100 MG capsule  Commonly known as:  MACRODANTIN  Take 100 mg by mouth  4 (four) times daily.     predniSONE 10 MG tablet  Commonly known as:  DELTASONE  Take 3 tablets once a day for 3 days     predniSONE 20 MG tablet  Commonly known as:  DELTASONE  Take 2 tablets (40 mg total) by mouth daily.     predniSONE 20 MG tablet  Commonly known as:  DELTASONE  Take 3 tablets (60 mg total) by mouth daily with breakfast.     ranitidine 150 MG tablet  Commonly known as:  ZANTAC  Take 150 mg by mouth daily.     SINGULAIR 10 MG tablet  Generic drug:  montelukast  Take 10 mg by mouth at bedtime.     URIBEL 118 MG Caps  Reported on 04/17/2016     VENTOLIN HFA 108 (90 Base) MCG/ACT inhaler  Generic drug:  albuterol  Inhale 2 puffs into the lungs daily as needed.     VITAMIN D-1000 MAX ST 1000 units tablet  Generic drug:  Cholecalciferol  Take 1 tablet by mouth daily.        Allergies:  Allergies  Allergen Reactions  . Bee Pollen Anaphylaxis  . Bee Venom Anaphylaxis  . Keflex [Cephalexin] Anaphylaxis  . Codeine     Other reaction(s): RASH  . Eggs Or Egg-Derived Products   . Ferrous Gluconate Diarrhea    And vomiting  . Hydrocodone Hives  . Iron Diarrhea  . Lactase Other (See Comments)  . Milk-Related Compounds   . Penicillins   . Shellfish Allergy Other (See Comments)  . Soy Allergy   . Latex Itching and Rash    Family History: Family History  Problem Relation Age of Onset  . Breast cancer Maternal Aunt   . Breast cancer Paternal Aunt   . Breast cancer Maternal Grandmother   . Breast cancer Paternal Grandmother   . Breast cancer Paternal Aunt   . Kidney cancer Maternal Uncle   . Prostate cancer Paternal Grandfather     Social History:  reports that she has quit smoking. She does not have any smokeless tobacco history on file. She reports that she drinks alcohol. She reports that she does not use illicit drugs.  ROS: UROLOGY Frequent Urination?: Yes Hard to postpone urination?: Yes Burning/pain with urination?: No Get up at night  to urinate?: Yes Leakage of urine?: Yes Urine stream starts and stops?: No Trouble starting stream?: No Do you have to strain to urinate?: No Blood in urine?: Yes Urinary tract infection?: Yes Sexually transmitted disease?: No Injury to kidneys or bladder?: No Painful intercourse?: No Weak stream?: No Currently pregnant?: No Vaginal bleeding?:  No Last menstrual period?: n  Gastrointestinal Nausea?: No Vomiting?: No Indigestion/heartburn?: Yes Diarrhea?: No Constipation?: No  Constitutional Fever: No Night sweats?: Yes Weight loss?: No Fatigue?: Yes  Skin Skin rash/lesions?: Yes Itching?: Yes  Eyes Blurred vision?: Yes Double vision?: Yes  Ears/Nose/Throat Sore throat?: Yes Sinus problems?: Yes  Hematologic/Lymphatic Swollen glands?: Yes Easy bruising?: Yes  Cardiovascular Leg swelling?: Yes Chest pain?: No  Respiratory Cough?: Yes Shortness of breath?: Yes  Endocrine Excessive thirst?: Yes  Musculoskeletal Back pain?: Yes Joint pain?: Yes  Neurological Headaches?: Yes Dizziness?: Yes  Psychologic Depression?: No Anxiety?: Yes  Physical Exam: BP 133/77 mmHg  Pulse 88  Ht 5\' 2"  (1.575 m)  Wt 154 lb (69.854 kg)  BMI 28.16 kg/m2  Constitutional: Well nourished. Alert and oriented, No acute distress. HEENT: [address] AT, moist mucus membranes. Trachea midline, no masses. Cardiovascular: No clubbing, cyanosis, or edema. Respiratory: Normal respiratory effort, no increased work of breathing. GI: Abdomen is soft, non tender, non distended, no abdominal masses. Liver and spleen not palpable.  No hernias appreciated.  Stool sample for occult testing is not indicated.   GU: No CVA tenderness.  No bladder fullness or masses.  Normal external genitalia, normal pubic hair distribution, no lesions.  Normal urethral meatus, no lesions, no prolapse, no discharge.   No urethral masses, tenderness and/or tenderness. No bladder fullness, tenderness or masses.  Normal vagina mucosa, good estrogen effect, no discharge, no lesions, good pelvic support, no cystocele or rectocele noted.  No cervical motion tenderness.  Uterus is freely mobile and non-fixed.  No adnexal/parametria masses or tenderness noted.  Anus and perineum are without rashes or lesions.    Skin: No rashes, bruises or suspicious lesions. Lymph: No cervical or inguinal adenopathy. Neurologic: Grossly intact, no focal deficits, moving all 4 extremities. Psychiatric: Normal mood and affect.  Laboratory Data: Lab Results  Component Value Date   WBC 9.6 04/11/2016   HGB 13.6 04/11/2016   HCT 41.9 04/11/2016   MCV 85.4 04/11/2016   PLT 339 04/11/2016    Lab Results  Component Value Date   CREATININE 0.79 04/17/2016    Lab Results  Component Value Date   AST 26 04/11/2016   Lab Results  Component Value Date   ALT 33 04/11/2016    Urinalysis Results for orders placed or performed in visit on 04/17/16  CULTURE, URINE COMPREHENSIVE  Result Value Ref Range   Urine Culture, Comprehensive Final report    Result 1 Comment   Microscopic Examination  Result Value Ref Range   WBC, UA None seen 0 -  5 /hpf   RBC, UA 0-2 0 -  2 /hpf   Epithelial Cells (non renal) 0-10 0 - 10 /hpf   Bacteria, UA None seen None seen/Few  Urinalysis, Complete  Result Value Ref Range   Specific Gravity, UA 1.010 1.005 - 1.030   pH, UA 6.5 5.0 - 7.5   Color, UA Yellow Yellow   Appearance Ur Clear Clear   Leukocytes, UA Negative Negative   Protein, UA Negative Negative/Trace   Glucose, UA Negative Negative   Ketones, UA Negative Negative   RBC, UA Trace (A) Negative   Bilirubin, UA Negative Negative   Urobilinogen, Ur 0.2 0.2 - 1.0 mg/dL   Nitrite, UA Negative Negative   Microscopic Examination See below:   BUN+Creat  Result Value Ref Range   BUN 21 6 - 24 mg/dL   Creatinine, Ser 0.79 0.57 - 1.00 mg/dL   GFR calc non Af  Amer 90 >59 mL/min/1.73   GFR calc Af Amer 104 >59 mL/min/1.73    BUN/Creatinine Ratio 27 (H) 9 - 23     Assessment & Plan:    1. Gross hematuria:    I explained to the patient that there are a number of causes that can be associated with blood in the urine, such as stones,  UTI's, damage to the urinary tract and/or cancer.  At this time, I felt that the patient warranted further urologic evaluation.   The AUA guidelines state that a CT urogram is the preferred imaging study to evaluate hematuria.  Her reproductive status is status post tubal ligation.    Because of patient's multiple allergies including shellfish, we need to resort to a non-contrast study.  In the latter case,  I told her  that an upper tract study without contrast will lack the detail of excluding some urologic tumors.  Because of this, she would need to undergo cystoscopy with bilateral retrogrades in the OR to complete the hematuria workup in addition to the imaging studies.   The patient had the opportunity to ask questions which were answered. Based upon this discussion, the patient is willing to proceed. Therefore, I've ordered: a CT abdomen and pelvis without contrast.  She will be scheduled for cystoscopy bilateral retrogrades in the future.    She will return following all of the above for discussion of the results.     - Urinalysis, Complete - CULTURE, URINE COMPREHENSIVE - BUN+Creat  2. Recurrent UTI:   I do not have documented infections at this time. We will continue to monitor.  I explained to the patient that when she should experience symptoms of urinary tract infection, she should contact our office so that we and acquire a UA and send a culture if appropriate.  3. Frequency:   Patient is experiencing excessive urinary frequency.  We will readdress once she has completed her hematuria workup.  4. Nocturia:   See above.     Return for CT report.  These notes generated with voice recognition software. I apologize for typographical errors.  Zara Council,  [facility] [address], [place],  [address] [phone]

## 2016-04-18 LAB — BUN+CREAT
BUN/Creatinine Ratio: 27 — ABNORMAL HIGH (ref 9–23)
BUN: 21 mg/dL (ref 6–24)
Creatinine, Ser: 0.79 mg/dL (ref 0.57–1.00)
GFR calc Af Amer: 104 mL/min/{1.73_m2} (ref >59)
GFR, EST NON AFRICAN AMERICAN: 90 mL/min/{1.73_m2} (ref >59)

## 2016-04-20 LAB — CULTURE, URINE COMPREHENSIVE

## 2016-04-22 ENCOUNTER — Telehealth: Payer: Self-pay | Admitting: Urology

## 2016-04-22 NOTE — Telephone Encounter (Signed)
Would you please send a copy of this office visit to the patient's referring provider, [name], PA?

## 2016-04-23 NOTE — Telephone Encounter (Signed)
Notes sent.

## 2016-04-24 ENCOUNTER — Ambulatory Visit
Admission: RE | Admit: 2016-04-24 | Discharge: 2016-04-24 | Disposition: A | Discharge status: Home / Self Care | Payer: 59 | Source: Ambulatory Visit | Attending: Urology | Admitting: Urology

## 2016-04-24 DIAGNOSIS — I700 Atherosclerosis of aorta: Secondary | ICD-10-CM | POA: Insufficient documentation

## 2016-04-24 DIAGNOSIS — R310 Gross hematuria: Secondary | ICD-10-CM | POA: Insufficient documentation

## 2016-04-30 ENCOUNTER — Ambulatory Visit (INDEPENDENT_AMBULATORY_CARE_PROVIDER_SITE_OTHER): Payer: 59 | Admitting: Urology

## 2016-04-30 ENCOUNTER — Encounter: Payer: Self-pay | Admitting: Urology

## 2016-04-30 ENCOUNTER — Telehealth: Payer: Self-pay | Admitting: Radiology

## 2016-04-30 VITALS — BP 128/74 | HR 85 | Ht 62.5 in | Wt 157.7 lb

## 2016-04-30 DIAGNOSIS — R310 Gross hematuria: Secondary | ICD-10-CM | POA: Diagnosis not present

## 2016-04-30 DIAGNOSIS — N390 Urinary tract infection, site not specified: Secondary | ICD-10-CM | POA: Diagnosis not present

## 2016-04-30 DIAGNOSIS — R350 Frequency of micturition: Secondary | ICD-10-CM

## 2016-04-30 DIAGNOSIS — R351 Nocturia: Secondary | ICD-10-CM | POA: Diagnosis not present

## 2016-04-30 LAB — URINALYSIS, COMPLETE
BILIRUBIN UA: NEGATIVE
GLUCOSE, UA: NEGATIVE
KETONES UA: NEGATIVE
Leukocytes, UA: NEGATIVE
NITRITE UA: NEGATIVE
Protein, UA: NEGATIVE
SPEC GRAV UA: 1.02 (ref 1.005–1.030)
UUROB: 0.2 mg/dL (ref 0.2–1.0)
pH, UA: 7 (ref 5.0–7.5)

## 2016-04-30 LAB — MICROSCOPIC EXAMINATION: Bacteria, UA: NONE SEEN

## 2016-04-30 NOTE — Telephone Encounter (Signed)
LMOM. Need to notify pt of surgery information. 

## 2016-04-30 NOTE — Progress Notes (Signed)
7:26 PM   Kristine [name] 09-02-1971 [id number]  Referring provider: Sallee Lange, NP [address] [place], [place] [address]  Chief Complaint  Patient presents with  . Results    CT    HPI: Patient is a 45 year old Caucasian female who Presents today to discuss her CT scan results.   Background history  Patient was referred to [place] by [name], PA, for hematuria, recurrent urinary tract infections and incontinence.  Patient states that since she started the medication Plaquenil last August, she has been experiencing urinary frequency, urgency, nocturia, incontinence, gross hematuria and recurrent urinary tract infections.  She states that her daytime frequency is so numerous that she cannot count how many times she uses a restroom.  She states that some nights she experiences nocturia 23.  She is experiencing both stress and urge incontinence.  She wears 1-2 sanitary napkin's daily.  She states that she had her bladder stretched by Dr. Yves Dill 2003.  I do not have any documented urinary tract infections.  She states that she just has antibiotics called in when she is experiencing infections.  Her UA today is unremarkable.  She does not have a family medical history of nephrolithiasis or hematuria.  Her grandfather was diagnosed with prostate cancer and her uncle was diagnosed with kidney cancer.  She is not experiencing any suprapubic pain, abdominal pain or flank pain. She denies any recent fevers, chills, nausea or vomiting.   She is a former smoker, with a 1ppd history.  Quit 8 years ago.  She is exposed to secondhand smoke.  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    Today, she states she feels that she has a urinary tract infection. She is still experiencing frequency of urination, nocturia and leakage of urine.  Her CT scan performed on 04/24/2016 noted no acute findings, no urinary tract calculi and aortic  atherosclerosis.  I have reviewed the films with the patient.  PMH: Past Medical History  Diagnosis Date  . Asthma   . Headache, migraine 09/30/2013  . Absence of interventricular septum 08/05/2013  . Allergic rhinitis 04/17/2016  . Allergic urticaria due to ingested food 02/09/2016  . Infection of urinary tract 09/30/2013  . Irregular bleeding 09/30/2013  . Heavy drinker ([place]) 04/11/2014    Overview:  Patient reports drinking 2-3 bottles of wine each weekend.    . Excess weight 09/30/2013  . Clinical depression 09/30/2013  . Abnormal weight gain 08/05/2013    Surgical History: Past Surgical History  Procedure Laterality Date  . Breast biopsy Left 11/13/13    benign  . Coronary angioplasty    . Nasal sinus surgery      x12  . Colonoscopy    . Cystoscopy with hydrodistension and biopsy      Home Medications:    Medication List       This list is accurate as of: 04/30/16 11:59 PM.  Always use your most recent med list.               azelastine 0.05 % ophthalmic solution  Commonly known as:  OPTIVAR  Place 1 drop into both eyes 2 (two) times daily.     Azelastine HCl 0.15 % Soln  Place 1 spray into the nose daily.     Calcium Carb-Ergocalciferol 500-200 MG-UNIT Tabs  Take 1 tablet by mouth 2 (two) times daily.     cetirizine 10  MG tablet  Commonly known as:  ZYRTEC  Take 1 tablet by mouth daily.     EPINEPHrine 0.3 mg/0.3 mL Soaj injection  Commonly known as:  EPI-PEN  Inject 0.3 mLs (0.3 mg total) into the muscle once.     fexofenadine-pseudoephedrine 180-240 MG 24 hr tablet  Commonly known as:  ALLEGRA-D 24  Take 1 tablet by mouth daily.     fluticasone 50 MCG/ACT nasal spray  Commonly known as:  FLONASE  Place 1 spray into both nostrils daily.     hydrochlorothiazide 12.5 MG tablet  Commonly known as:  HYDRODIURIL  Take 12.5 mg by mouth daily.     hydroxychloroquine 200 MG tablet  Commonly known as:  PLAQUENIL  Take 200 mg by mouth 2 (two) times daily.      hydrOXYzine 25 MG tablet  Commonly known as:  ATARAX/VISTARIL  Take 25 mg by mouth daily.     LORazepam 1 MG tablet  Commonly known as:  ATIVAN  Take 1 tablet (1 mg total) by mouth 2 (two) times daily.     nitrofurantoin 100 MG capsule  Commonly known as:  MACRODANTIN  Take 100 mg by mouth 4 (four) times daily.     predniSONE 10 MG tablet  Commonly known as:  DELTASONE  Take 3 tablets once a day for 3 days     predniSONE 20 MG tablet  Commonly known as:  DELTASONE  Take 2 tablets (40 mg total) by mouth daily.     predniSONE 20 MG tablet  Commonly known as:  DELTASONE  Take 3 tablets (60 mg total) by mouth daily with breakfast.     ranitidine 150 MG tablet  Commonly known as:  ZANTAC  Take 150 mg by mouth daily.     SINGULAIR 10 MG tablet  Generic drug:  montelukast  Take 10 mg by mouth at bedtime.     URIBEL 118 MG Caps  Reported on 04/17/2016     VENTOLIN HFA 108 (90 Base) MCG/ACT inhaler  Generic drug:  albuterol  Inhale 2 puffs into the lungs daily as needed.     VITAMIN D-1000 MAX ST 1000 units tablet  Generic drug:  Cholecalciferol  Take 1 tablet by mouth daily.        Allergies:  Allergies  Allergen Reactions  . Bee Pollen Anaphylaxis  . Bee Venom Anaphylaxis  . Keflex [Cephalexin] Anaphylaxis  . Codeine     Other reaction(s): RASH  . Eggs Or Egg-Derived Products   . Ferrous Gluconate Diarrhea    And vomiting  . Hydrocodone Hives  . Iron Diarrhea  . Lactase Other (See Comments)  . Milk-Related Compounds   . Penicillins   . Shellfish Allergy Other (See Comments)  . Soy Allergy   . Wheat Bran   . Latex Itching and Rash    Family History: Family History  Problem Relation Age of Onset  . Breast cancer Maternal Aunt   . Breast cancer Paternal Aunt   . Breast cancer Maternal Grandmother   . Breast cancer Paternal Grandmother   . Breast cancer Paternal Aunt   . Kidney cancer Maternal Uncle   . Prostate cancer Paternal Grandfather   .  Kidney disease      father's side    Social History:  reports that she has quit smoking. She does not have any smokeless tobacco history on file. She reports that she does not drink alcohol or use illicit drugs.  ROS: UROLOGY Frequent Urination?: Yes Hard  to postpone urination?: No Burning/pain with urination?: No Get up at night to urinate?: Yes Leakage of urine?: Yes Urine stream starts and stops?: No Trouble starting stream?: No Do you have to strain to urinate?: No Blood in urine?: No Urinary tract infection?: Yes Sexually transmitted disease?: No Injury to kidneys or bladder?: No Painful intercourse?: No Weak stream?: No Currently pregnant?: No Vaginal bleeding?: No Last menstrual period?: n  Gastrointestinal Nausea?: No Vomiting?: No Indigestion/heartburn?: Yes Diarrhea?: No Constipation?: No  Constitutional Fever: No Night sweats?: Yes Weight loss?: No Fatigue?: Yes  Skin Skin rash/lesions?: Yes Itching?: Yes  Eyes Blurred vision?: Yes Double vision?: No  Ears/Nose/Throat Sore throat?: Yes Sinus problems?: Yes  Hematologic/Lymphatic Swollen glands?: Yes Easy bruising?: No  Cardiovascular Leg swelling?: Yes Chest pain?: No  Respiratory Cough?: No Shortness of breath?: No  Endocrine Excessive thirst?: Yes  Musculoskeletal Back pain?: No Joint pain?: No  Neurological Headaches?: No Dizziness?: Yes  Psychologic Depression?: No Anxiety?: No  Physical Exam: BP 128/74 mmHg  Pulse 85  Ht 5' 2.5" (1.588 m)  Wt 157 lb 11.2 oz (71.532 kg)  BMI 28.37 kg/m2  Constitutional: Well nourished. Alert and oriented, No acute distress. HEENT: [place] AT, moist mucus membranes. Trachea midline, no masses. Cardiovascular: No clubbing, cyanosis, or edema. Respiratory: Normal respiratory effort, no increased work of breathing. Skin: No rashes, bruises or suspicious lesions. Lymph: No cervical or inguinal adenopathy. Neurologic: Grossly intact, no  focal deficits, moving all 4 extremities. Psychiatric: Normal mood and affect.  Laboratory Data: Lab Results  Component Value Date   WBC 9.6 04/11/2016   HGB 13.6 04/11/2016   HCT 41.9 04/11/2016   MCV 85.4 04/11/2016   PLT 339 04/11/2016    Lab Results  Component Value Date   CREATININE 0.79 04/17/2016    Lab Results  Component Value Date   AST 26 04/11/2016   Lab Results  Component Value Date   ALT 33 04/11/2016    Urinalysis: Results for orders placed or performed in visit on 04/30/16  CULTURE, URINE COMPREHENSIVE  Result Value Ref Range   Urine Culture, Comprehensive Final report    Result 1 Comment   Microscopic Examination  Result Value Ref Range   WBC, UA 0-5 0 -  5 /hpf   RBC, UA 0-2 0 -  2 /hpf   Epithelial Cells (non renal) >10 (A) 0 - 10 /hpf   Bacteria, UA None seen None seen/Few  Urinalysis, Complete  Result Value Ref Range   Specific Gravity, UA 1.020 1.005 - 1.030   pH, UA 7.0 5.0 - 7.5   Color, UA Yellow Yellow   Appearance Ur Hazy (A) Clear   Leukocytes, UA Negative Negative   Protein, UA Negative Negative/Trace   Glucose, UA Negative Negative   Ketones, UA Negative Negative   RBC, UA Trace (A) Negative   Bilirubin, UA Negative Negative   Urobilinogen, Ur 0.2 0.2 - 1.0 mg/dL   Nitrite, UA Negative Negative   Microscopic Examination See below:    Pertinent Imaging CLINICAL DATA: Hematuria.  EXAM: CT ABDOMEN AND PELVIS WITHOUT CONTRAST  TECHNIQUE: Multidetector CT imaging of the abdomen and pelvis was performed following the standard protocol without IV contrast.  COMPARISON: None.  FINDINGS: Lower chest: There is no pleural or pericardial effusion identified.  Hepatobiliary: There is no focal liver abnormality identified. The gallbladder appears normal. No biliary dilatation.  Pancreas: No mass or inflammatory process identified on this un-enhanced exam.  Spleen: Within normal limits in size.  Adrenals/Urinary  Tract: Normal appearance of the adrenal gland. The kidneys are on unremarkable. The urinary bladder appears normal.  Stomach/Bowel: The stomach appears normal. No pathologic dilatation of the large or small bowel loops. The appendix is visualized and appears normal.  Vascular/Lymphatic: Calcified atherosclerotic disease involves the abdominal aorta. No aneurysm. No enlarged upper abdominal lymph nodes. No pelvic or inguinal adenopathy.  Reproductive: The uterus appears normal. Cyst in the right adnexa measures 2.6 cm.  Other: There is no ascites or focal fluid collections within the abdomen or pelvis.  Musculoskeletal: Spondylosis noted within the lumbar spine. No aggressive lytic or sclerotic bone lesions.  IMPRESSION: 1. No acute findings identified within the abdomen or pelvis. 2. No urinary tract calculi identified. 3. Aortic atherosclerosis.   Electronically Signed  By: Kerby Moors M.D.  On: 04/24/2016 16:46        Assessment & Plan:    Patient will undergo cystoscopic be with bilateral retrogrades with possible ureteroscopic and possible ureteral stent placement for gross hematuria.  1. Gross hematuria:    Patient has completed her CT of the abdomen and pelvis without contrast. No abnormalities were identified.  Her reproductive status is status post tubal ligation.  She will now need to be scheduled for a cystoscopically with bilateral retrogrades in the OR to complete her hematuria workup.  I described to the patient how the procedure is performed and the risk associated with the procedure, such as: Infection, bleeding, uncomfortableness for the first few days after the procedure, the possibility of a biopsy of an area of concern in the ureters or bladder and the possibility of a stent placement.   The patient had the opportunity to ask questions which were answered. Based upon this discussion, the patient is willing to proceed.   She will be scheduled for  cystoscopy bilateral retrogrades in the future.    She will return following all of the above for discussion of the results.     - Urinalysis, Complete - CULTURE, URINE COMPREHENSIVE   2. Recurrent UTI:   I do not have documented infections at this time.  We will continue to monitor.  She states that she feels that she is having symptoms of a urinary tract infection. Her UA is unremarkable, but we will be sending it for urine culture in preparation for her upcoming procedure. No antibiotics are prescribed at this time. If appropriate, she will be prescribed an antibiotic once the culture and sensitivities are available.   3. Frequency:   Patient is experiencing excessive urinary frequency.  We will readdress once she has completed her hematuria workup.  4. Nocturia:   See above.     Return for cystoscopy with bilateral retrogrades for hematuria.  These notes generated with voice recognition software. I apologize for typographical errors.  Zara Council, [facility] [address] [place], [place] [address] [phone]

## 2016-04-30 NOTE — Telephone Encounter (Signed)
Notified pt of surgery scheduled 05/21/16, pre-admit testing appt on 05/14/16 @9 :00 & to call Friday prior to surgery for arrival time to SDS. Pt voices understanding.

## 2016-05-02 LAB — CULTURE, URINE COMPREHENSIVE

## 2016-05-10 NOTE — Telephone Encounter (Signed)
Pt called stating she would like to cancel surgery for now.  She would like to discuss with husband first. F/u appt made. Pt aware.

## 2016-05-14 ENCOUNTER — Ambulatory Visit: Payer: 59 | Admitting: Urology

## 2016-05-14 ENCOUNTER — Other Ambulatory Visit: Payer: 59

## 2016-05-21 ENCOUNTER — Ambulatory Visit: Admission: RE | Admit: 2016-05-21 | Payer: 59 | Source: Ambulatory Visit | Admitting: Urology

## 2016-05-21 ENCOUNTER — Encounter: Admission: RE | Payer: Self-pay | Source: Ambulatory Visit

## 2016-05-21 SURGERY — CYSTOSCOPY, WITH RETROGRADE PYELOGRAM
Anesthesia: Choice

## 2016-05-28 ENCOUNTER — Telehealth: Payer: Self-pay | Admitting: Urology

## 2016-05-28 DIAGNOSIS — N3281 Overactive bladder: Secondary | ICD-10-CM

## 2016-05-28 NOTE — Telephone Encounter (Signed)
I dont see where Kristine [name] was given. Please advise.

## 2016-05-28 NOTE — Telephone Encounter (Signed)
Pt called in to reschedule an appt she had to cancel.  She is coming in on 8/2.  She would like for you to call in rx for Myrbetriq 50 mg to [facility].  We gave her samples last time she was here and she will run out before her appt.

## 2016-06-01 MED ORDER — MIRABEGRON ER 50 MG PO TB24: 50.0000 mg | ORAL_TABLET | Freq: Every day | ORAL | Status: DC

## 2016-06-01 NOTE — Telephone Encounter (Signed)
Myrbetriq was sent to pt pharmacy.

## 2016-06-01 NOTE — Telephone Encounter (Signed)
I don't see it listed in her chart either.  It is okay to send in a script for Myrbetriq 50 mg daily, but only a one month supply.  She still needs her cystoscopy with retrogrades.

## 2016-06-06 ENCOUNTER — Telehealth: Payer: Self-pay

## 2016-06-13 ENCOUNTER — Encounter: Payer: Self-pay | Admitting: Urology

## 2016-06-13 ENCOUNTER — Ambulatory Visit (INDEPENDENT_AMBULATORY_CARE_PROVIDER_SITE_OTHER): Payer: 59 | Admitting: Urology

## 2016-06-13 VITALS — BP 126/77 | HR 87 | Ht 65.0 in | Wt 165.1 lb

## 2016-06-13 DIAGNOSIS — R351 Nocturia: Secondary | ICD-10-CM

## 2016-06-13 DIAGNOSIS — N390 Urinary tract infection, site not specified: Secondary | ICD-10-CM

## 2016-06-13 DIAGNOSIS — R310 Gross hematuria: Secondary | ICD-10-CM

## 2016-06-13 DIAGNOSIS — R350 Frequency of micturition: Secondary | ICD-10-CM

## 2016-06-13 LAB — URINALYSIS, COMPLETE
BILIRUBIN UA: NEGATIVE
Glucose, UA: NEGATIVE
Ketones, UA: NEGATIVE
Leukocytes, UA: NEGATIVE
NITRITE UA: NEGATIVE
PH UA: 6 (ref 5.0–7.5)
Protein, UA: NEGATIVE
Specific Gravity, UA: 1.025 (ref 1.005–1.030)
UUROB: 0.2 mg/dL (ref 0.2–1.0)

## 2016-06-13 LAB — MICROSCOPIC EXAMINATION: BACTERIA UA: NONE SEEN

## 2016-06-14 NOTE — Progress Notes (Signed)
2:13 PM   Kristine [name] 15-Apr-1971 [id number]  Referring provider: Marinda Elk, MD [place] [facility] [place] [address]  Chief Complaint  Patient presents with  . Advice Only    discuss surgery     HPI: Patient is a 45 year old Caucasian female who presents today to discuss her options concerning completing her hematuria workup.    Background history  Patient was referred to [place] by [name], PA, for hematuria, recurrent urinary tract infections and incontinence.  Patient states that since she started the medication Plaquenil last August, she has been experiencing urinary frequency, urgency, nocturia, incontinence, gross hematuria and recurrent urinary tract infections.  She states that her daytime frequency is so numerous that she cannot count how many times she uses a restroom.  She states that some nights she experiences nocturia 23.  She is experiencing both stress and urge incontinence.  She wears 1-2 sanitary napkin's daily.  She states that she had her bladder stretched by Dr. Yves Dill 2003.  I do not have any documented urinary tract infections.  She states that she just has antibiotics called in when she is experiencing infections.   She does not have a family medical history of nephrolithiasis or hematuria.  Her grandfather was diagnosed with prostate cancer and her uncle was diagnosed with kidney cancer.  She is a former smoker, with a 1 ppd history.  Quit 8 years ago.  She is exposed to secondhand smoke.  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  Her CT scan performed on 04/24/2016 noted no acute findings, no urinary tract calculi and aortic atherosclerosis.    Patient states she has a large medical bill and she cannot afford to undergo her cystoscopy with bilateral retrogrades.  She would like to know her other options at this time.    She also feels that she may have a urinary tract infection. She is having frequent  urination, urgency, nocturia and urge incontinence.  However, these symptoms have been long-standing. She was given a trial of Myrbetriq, but her insurance would not cover the medication.  She she thinks it was helpful.   Her UA today was positive for hematuria.  It was not suspicious for infection.     PMH: Past Medical History:  Diagnosis Date  . Abnormal weight gain 08/05/2013  . Absence of interventricular septum 08/05/2013  . Allergic rhinitis 04/17/2016  . Allergic urticaria due to ingested food 02/09/2016  . Asthma   . Clinical depression 09/30/2013  . Excess weight 09/30/2013  . Headache, migraine 09/30/2013  . Heavy drinker ([place]) 04/11/2014   Overview:  Patient reports drinking 2-3 bottles of wine each weekend.    . Infection of urinary tract 09/30/2013  . Irregular bleeding 09/30/2013    Surgical History: Past Surgical History:  Procedure Laterality Date  . BREAST BIOPSY Left 11/13/13   benign  . COLONOSCOPY    . CORONARY ANGIOPLASTY    . CYSTOSCOPY WITH HYDRODISTENSION AND BIOPSY    . NASAL SINUS SURGERY     x12    Home Medications:    Medication List       Accurate as of 06/13/16 11:59 PM. Always use your most recent med list.          azelastine 0.05 % ophthalmic solution Commonly known as:  OPTIVAR Place 1 drop into both eyes 2 (two) times daily.   Azelastine HCl 0.15 % Soln Place into  the nose.   Calcium Carb-Ergocalciferol 500-200 MG-UNIT Tabs Take 1 tablet by mouth 2 (two) times daily.   cetirizine 10 MG tablet Commonly known as:  ZYRTEC Take 1 tablet by mouth daily.   EPINEPHrine 0.3 mg/0.3 mL Soaj injection Commonly known as:  EPI-PEN Inject 0.3 mLs (0.3 mg total) into the muscle once.   fexofenadine-pseudoephedrine 180-240 MG 24 hr tablet Commonly known as:  ALLEGRA-D 24 Take 1 tablet by mouth daily.   fluticasone 50 MCG/ACT nasal spray Commonly known as:  FLONASE Place 1 spray into both nostrils daily.   hydrochlorothiazide 12.5 MG  tablet Commonly known as:  HYDRODIURIL Take 12.5 mg by mouth daily.   hydroxychloroquine 200 MG tablet Commonly known as:  PLAQUENIL Take 200 mg by mouth 2 (two) times daily.   hydrOXYzine 25 MG tablet Commonly known as:  ATARAX/VISTARIL Take 25 mg by mouth daily.   LORazepam 1 MG tablet Commonly known as:  ATIVAN Take 1 tablet (1 mg total) by mouth 2 (two) times daily.   mirabegron ER 50 MG Tb24 tablet Commonly known as:  MYRBETRIQ Take 1 tablet (50 mg total) by mouth daily.   nitrofurantoin 100 MG capsule Commonly known as:  MACRODANTIN Take 100 mg by mouth 4 (four) times daily.   predniSONE 10 MG tablet Commonly known as:  DELTASONE Take 3 tablets once a day for 3 days   predniSONE 20 MG tablet Commonly known as:  DELTASONE Take 2 tablets (40 mg total) by mouth daily.   predniSONE 20 MG tablet Commonly known as:  DELTASONE Take 3 tablets (60 mg total) by mouth daily with breakfast.   ranitidine 150 MG tablet Commonly known as:  ZANTAC Take 150 mg by mouth daily.   SINGULAIR 10 MG tablet Generic drug:  montelukast Take 10 mg by mouth at bedtime.   terbinafine 250 MG tablet Commonly known as:  LAMISIL Take by mouth.   URIBEL 118 MG Caps Reported on 04/17/2016   VENTOLIN HFA 108 (90 Base) MCG/ACT inhaler Generic drug:  albuterol Inhale 2 puffs into the lungs daily as needed.   VITAMIN D-1000 MAX ST 1000 units tablet Generic drug:  Cholecalciferol Take 1 tablet by mouth daily.       Allergies:  Allergies  Allergen Reactions  . Bee Pollen Anaphylaxis  . Bee Venom Anaphylaxis  . Keflex [Cephalexin] Anaphylaxis  . Codeine     Other reaction(s): RASH  . Eggs Or Egg-Derived Products   . Ferrous Gluconate Diarrhea    And vomiting  . Hydrocodone Hives  . Iron Diarrhea  . Lactase Other (See Comments)  . Milk-Related Compounds   . Penicillins   . Shellfish Allergy Other (See Comments)  . Soy Allergy   . Wheat Bran   . Latex Itching and Rash     Family History: Family History  Problem Relation Age of Onset  . Breast cancer Maternal Aunt   . Breast cancer Paternal Aunt   . Breast cancer Maternal Grandmother   . Breast cancer Paternal Grandmother   . Breast cancer Paternal Aunt   . Kidney cancer Maternal Uncle   . Prostate cancer Paternal Grandfather   . Kidney disease      father's side    Social History:  reports that she has quit smoking. She does not have any smokeless tobacco history on file. She reports that she does not drink alcohol or use drugs.  ROS: UROLOGY Frequent Urination?: Yes Hard to postpone urination?: Yes Burning/pain with urination?: No Get  up at night to urinate?: Yes Leakage of urine?: Yes Urine stream starts and stops?: No Trouble starting stream?: No Do you have to strain to urinate?: No Blood in urine?: Yes Urinary tract infection?: Yes Sexually transmitted disease?: No Injury to kidneys or bladder?: No Painful intercourse?: No Weak stream?: No Currently pregnant?: No Vaginal bleeding?: No Last menstrual period?: n  Gastrointestinal Nausea?: No Vomiting?: No Indigestion/heartburn?: Yes Diarrhea?: No Constipation?: No  Constitutional Fever: No Night sweats?: Yes Weight loss?: No Fatigue?: No  Skin Skin rash/lesions?: No Itching?: Yes  Eyes Blurred vision?: No Double vision?: No  Ears/Nose/Throat Sore throat?: Yes Sinus problems?: Yes  Hematologic/Lymphatic Swollen glands?: Yes Easy bruising?: No  Cardiovascular Leg swelling?: No Chest pain?: No  Respiratory Cough?: No Shortness of breath?: Yes  Endocrine Excessive thirst?: Yes  Musculoskeletal Back pain?: No Joint pain?: No  Neurological Headaches?: Yes Dizziness?: Yes  Psychologic Depression?: No Anxiety?: No  Physical Exam: BP 126/77   Pulse 87   Ht 5\' 5"  (1.651 m)   Wt 165 lb 1.6 oz (74.9 kg)   BMI 27.47 kg/m   Constitutional: Well nourished. Alert and oriented, No acute  distress. HEENT: [place] AT, moist mucus membranes. Trachea midline, no masses. Cardiovascular: No clubbing, cyanosis, or edema. Respiratory: Normal respiratory effort, no increased work of breathing. Skin: No rashes, bruises or suspicious lesions. Lymph: No cervical or inguinal adenopathy. Neurologic: Grossly intact, no focal deficits, moving all 4 extremities. Psychiatric: Normal mood and affect.  Laboratory Data: Lab Results  Component Value Date   WBC 9.6 04/11/2016   HGB 13.6 04/11/2016   HCT 41.9 04/11/2016   MCV 85.4 04/11/2016   PLT 339 04/11/2016    Lab Results  Component Value Date   CREATININE 0.79 04/17/2016    Lab Results  Component Value Date   AST 26 04/11/2016   Lab Results  Component Value Date   ALT 33 04/11/2016    Urinalysis: Results for orders placed or performed in visit on 06/13/16  Microscopic Examination  Result Value Ref Range   WBC, UA 0-5 0 - 5 /hpf   RBC, UA 3-10 (A) 0 - 2 /hpf   Epithelial Cells (non renal) >10 (A) 0 - 10 /hpf   Bacteria, UA None seen None seen/Few  Urinalysis, Complete  Result Value Ref Range   Specific Gravity, UA 1.025 1.005 - 1.030   pH, UA 6.0 5.0 - 7.5   Color, UA Yellow Yellow   Appearance Ur Clear Clear   Leukocytes, UA Negative Negative   Protein, UA Negative Negative/Trace   Glucose, UA Negative Negative   Ketones, UA Negative Negative   RBC, UA 1+ (A) Negative   Bilirubin, UA Negative Negative   Urobilinogen, Ur 0.2 0.2 - 1.0 mg/dL   Nitrite, UA Negative Negative   Microscopic Examination See below:    Pertinent Imaging CLINICAL DATA: Hematuria.  EXAM: CT ABDOMEN AND PELVIS WITHOUT CONTRAST  TECHNIQUE: Multidetector CT imaging of the abdomen and pelvis was performed following the standard protocol without IV contrast.  COMPARISON: None.  FINDINGS: Lower chest: There is no pleural or pericardial effusion identified.  Hepatobiliary: There is no focal liver abnormality identified.  The gallbladder appears normal. No biliary dilatation.  Pancreas: No mass or inflammatory process identified on this un-enhanced exam.  Spleen: Within normal limits in size.  Adrenals/Urinary Tract: Normal appearance of the adrenal gland. The kidneys are on unremarkable. The urinary bladder appears normal.  Stomach/Bowel: The stomach appears normal. No pathologic dilatation of the  large or small bowel loops. The appendix is visualized and appears normal.  Vascular/Lymphatic: Calcified atherosclerotic disease involves the abdominal aorta. No aneurysm. No enlarged upper abdominal lymph nodes. No pelvic or inguinal adenopathy.  Reproductive: The uterus appears normal. Cyst in the right adnexa measures 2.6 cm.  Other: There is no ascites or focal fluid collections within the abdomen or pelvis.  Musculoskeletal: Spondylosis noted within the lumbar spine. No aggressive lytic or sclerotic bone lesions.  IMPRESSION: 1. No acute findings identified within the abdomen or pelvis. 2. No urinary tract calculi identified. 3. Aortic atherosclerosis.   Electronically Signed  By: Kerby Moors M.D.  On: 04/24/2016 16:46        Assessment & Plan:    1. Gross hematuria:    Patient has completed her CT of the abdomen and pelvis without contrast.  She cannot afford the cystoscopy with bilateral retrogrades at this time.  I stated the non-contrast study will lack the detail of excluding some urologic tumors.  Not undergoing the cystoscopy with bilateral retrogrades may miss a tumor.  She understands this risk, but she simply cannot afford more hospital bills at this time.  She is willing to undergo a cystoscopy in the office at this time.    2. Recurrent UTI:   I do not have documented infections at this time.  We will continue to monitor.  She states that she feels that she is having symptoms of a urinary tract infection. Her UA is unremarkable.  I explained that her symptoms  are not due to an infection since they have been long standing.  .   3. Frequency:   Patient is experiencing excessive urinary frequency.  She had a trial of Myrbetriq, but her insurance will not cover the medication.  I have given her Toviaz 8 mg daily samples to try at this time.  We will readdress once she has completed her hematuria workup.  4. Nocturia:   See above.     Return for cystoscopy for microscopic hematuria.  These notes generated with voice recognition software. I apologize for typographical errors.  Zara Council, [facility] [address], [place], [place] [address] [phone]

## 2016-06-20 DIAGNOSIS — Z7952 Long term (current) use of systemic steroids: Secondary | ICD-10-CM | POA: Insufficient documentation

## 2016-07-04 ENCOUNTER — Emergency Department
Admission: EM | Admit: 2016-07-04 | Discharge: 2016-07-04 | Disposition: A | Discharge status: Home / Self Care | Payer: 59 | Attending: Emergency Medicine | Admitting: Emergency Medicine

## 2016-07-04 ENCOUNTER — Emergency Department: Payer: 59

## 2016-07-04 DIAGNOSIS — Z87891 Personal history of nicotine dependence: Secondary | ICD-10-CM | POA: Insufficient documentation

## 2016-07-04 DIAGNOSIS — R0789 Other chest pain: Secondary | ICD-10-CM | POA: Diagnosis not present

## 2016-07-04 DIAGNOSIS — T7840XA Allergy, unspecified, initial encounter: Secondary | ICD-10-CM

## 2016-07-04 DIAGNOSIS — J45909 Unspecified asthma, uncomplicated: Secondary | ICD-10-CM | POA: Diagnosis not present

## 2016-07-04 LAB — COMPREHENSIVE METABOLIC PANEL
ALK PHOS: 42 U/L (ref 38–126)
ALT: 29 U/L (ref 14–54)
ANION GAP: 9 (ref 5–15)
AST: 26 U/L (ref 15–41)
Albumin: 4 g/dL (ref 3.5–5.0)
BILIRUBIN TOTAL: 0.6 mg/dL (ref 0.3–1.2)
BUN: 17 mg/dL (ref 6–20)
CALCIUM: 8.7 mg/dL — AB (ref 8.9–10.3)
CO2: 29 mmol/L (ref 22–32)
Chloride: 96 mmol/L — ABNORMAL LOW (ref 101–111)
Creatinine, Ser: 0.66 mg/dL (ref 0.44–1.00)
Glucose, Bld: 102 mg/dL — ABNORMAL HIGH (ref 65–99)
POTASSIUM: 3 mmol/L — AB (ref 3.5–5.1)
Sodium: 134 mmol/L — ABNORMAL LOW (ref 135–145)
TOTAL PROTEIN: 6.9 g/dL (ref 6.5–8.1)

## 2016-07-04 LAB — CBC
HEMATOCRIT: 41.4 % (ref 35.0–47.0)
HEMOGLOBIN: 13.7 g/dL (ref 12.0–16.0)
MCH: 28.3 pg (ref 26.0–34.0)
MCHC: 33.2 g/dL (ref 32.0–36.0)
MCV: 85.4 fL (ref 80.0–100.0)
Platelets: 376 10*3/uL (ref 150–440)
RBC: 4.85 MIL/uL (ref 3.80–5.20)
RDW: 15.1 % — AB (ref 11.5–14.5)
WBC: 11.9 10*3/uL — AB (ref 3.6–11.0)

## 2016-07-04 LAB — TROPONIN I: Troponin I: <0.03 ng/mL (ref <0.03)

## 2016-07-04 NOTE — Discharge Instructions (Signed)

## 2016-07-04 NOTE — ED Triage Notes (Signed)
Pt arrives here from her allergy doctor with reports of increased dizziness/lightheadedness, rash and sore throat pain that has been getting worse over the last few days   Pt reports lots of allergies and wonders if her symptoms could be related to her allergies   Spouse is at the bedside and reports that he has noticed increased shortness of breath with exertion and the pt reported her chest feels as if an "elephant is sitting on her"

## 2016-07-04 NOTE — ED Provider Notes (Signed)
[facility] Emergency Department Provider Note  ____________________________________________   First MD Initiated Contact with Patient 07/04/16 1948     (approximate)  I have reviewed the triage vital signs and the nursing notes.   HISTORY  Chief Complaint Allergies; Sore Throat; Nasal Congestion; Rash; and Dizziness    HPI Kristine [name] is a 45 y.o. female history of severe allergies and anaphylaxis to multiple allergens who is managed by an allergy doctor in [place].  She presents today due to 2 days of increased shortness of breath particularly with exertion which she describes as mild to moderate and a sensation of pressure in her chest that has been present for more than 24 hours.She has felt similar to this in the past.  She says that she ate some chocolate cake about 48 hours ago and she knows that she should not have done so but she really wanted some cake.  She took 6 Benadryl (she is allowed to take up to 12 at once) and then went to bed.  When she awoke yesterday morning she felt general malaise, shortness of breath, and had a rash over most of her body.  However she frequently has urticaria and is on chronic prednisone (20 mg daily).  She also has inhalers including Ventolin to use as needed but she states that they do not help all that much.  Her primary care doctor or an allergist encouraged her to come to the emergency department for evaluation to determine if there is another acute process going on.  She denies nausea, vomiting, abdominal pain, dysuria, and fever/chills.   Past Medical History:  Diagnosis Date  . Abnormal weight gain 08/05/2013  . Absence of interventricular septum 08/05/2013  . Allergic rhinitis 04/17/2016  . Allergic urticaria due to ingested food 02/09/2016  . Asthma   . Clinical depression 09/30/2013  . Excess weight 09/30/2013  . Headache, migraine 09/30/2013  . Heavy drinker ([place]) 04/11/2014   Overview:  Patient  reports drinking 2-3 bottles of wine each weekend.    . Infection of urinary tract 09/30/2013  . Irregular bleeding 09/30/2013    Patient Active Problem List   Diagnosis Date Noted  . Allergic rhinitis 04/17/2016  . Allergic urticaria due to ingested food 02/09/2016  . Heavy drinker ([place]) 04/11/2014  . Clinical depression 09/30/2013  . Irregular bleeding 09/30/2013  . Headache, migraine 09/30/2013  . Excess weight 09/30/2013  . Frequent UTI 09/30/2013  . Infection of urinary tract 09/30/2013  . Absence of interventricular septum 08/05/2013  . Abnormal weight gain 08/05/2013    Past Surgical History:  Procedure Laterality Date  . BREAST BIOPSY Left 11/13/13   benign  . COLONOSCOPY    . CORONARY ANGIOPLASTY    . CYSTOSCOPY WITH HYDRODISTENSION AND BIOPSY    . NASAL SINUS SURGERY     x12    Prior to Admission medications   Medication Sig Start Date End Date Taking? Authorizing Provider  albuterol (VENTOLIN HFA) 108 (90 Base) MCG/ACT inhaler Inhale 2 puffs into the lungs daily as needed. 09/24/12   Historical Provider, MD  azelastine (OPTIVAR) 0.05 % ophthalmic solution Place 1 drop into both eyes 2 (two) times daily. 02/27/16 02/26/17  Historical Provider, MD  Azelastine HCl 0.15 % SOLN Place into the nose.    Historical Provider, MD  Calcium Carb-Ergocalciferol 500-200 MG-UNIT TABS Take 1 tablet by mouth 2 (two) times daily.    Historical Provider, MD  cetirizine (ZYRTEC) 10 MG tablet Take 1  tablet by mouth daily.    Historical Provider, MD  Cholecalciferol (VITAMIN D-1000 MAX ST) 1000 units tablet Take 1 tablet by mouth daily. 11/19/14   Historical Provider, MD  EPINEPHrine 0.3 mg/0.3 mL IJ SOAJ injection Inject 0.3 mLs (0.3 mg total) into the muscle once. 02/13/16   Nance Pear, MD  fexofenadine-pseudoephedrine (ALLEGRA-D 24) 180-240 MG 24 hr tablet Take 1 tablet by mouth daily.    Historical Provider, MD  fluticasone (FLONASE) 50 MCG/ACT nasal spray Place 1 spray into both  nostrils daily.    Historical Provider, MD  hydrochlorothiazide (HYDRODIURIL) 12.5 MG tablet Take 12.5 mg by mouth daily.    Historical Provider, MD  hydroxychloroquine (PLAQUENIL) 200 MG tablet Take 200 mg by mouth 2 (two) times daily. 03/12/16   Historical Provider, MD  hydrOXYzine (ATARAX/VISTARIL) 25 MG tablet Take 25 mg by mouth daily.    Historical Provider, MD  LORazepam (ATIVAN) 1 MG tablet Take 1 tablet (1 mg total) by mouth 2 (two) times daily. Patient not taking: Reported on 06/13/2016 04/11/16 04/11/17  Earleen Newport, MD  Meth-Hyo-M Bl-Na Phos-Ph Sal (URIBEL) 118 MG CAPS Reported on 04/17/2016 03/29/16   Historical Provider, MD  mirabegron ER (MYRBETRIQ) 50 MG TB24 tablet Take 1 tablet (50 mg total) by mouth daily. Patient not taking: Reported on 06/13/2016 06/01/16   Larene Beach A McGowan, PA-C  montelukast (SINGULAIR) 10 MG tablet Take 10 mg by mouth at bedtime.    Historical Provider, MD  nitrofurantoin (MACRODANTIN) 100 MG capsule Take 100 mg by mouth 4 (four) times daily.    Historical Provider, MD  predniSONE (DELTASONE) 10 MG tablet Take 3 tablets once a day for 3 days Patient not taking: Reported on 06/13/2016 08/06/15   Johnn Hai, PA-C  predniSONE (DELTASONE) 20 MG tablet Take 2 tablets (40 mg total) by mouth daily. Patient taking differently: Take 20 mg by mouth daily.  02/13/16   Nance Pear, MD  predniSONE (DELTASONE) 20 MG tablet Take 3 tablets (60 mg total) by mouth daily with breakfast. Patient not taking: Reported on 04/30/2016 04/11/16   Earleen Newport, MD  ranitidine (ZANTAC) 150 MG tablet Take 150 mg by mouth daily.    Historical Provider, MD  terbinafine (LAMISIL) 250 MG tablet Take by mouth. 05/21/16 08/19/16  Historical Provider, MD    Allergies Bee pollen; Bee venom; Keflex [cephalexin]; Codeine; Eggs or egg-derived products; Ferrous gluconate; Hydrocodone; Iron; Lactase; Milk-related compounds; Penicillins; Shellfish allergy; Soy allergy; Wheat bran; and  Latex  Family History  Problem Relation Age of Onset  . Breast cancer Maternal Aunt   . Breast cancer Paternal Aunt   . Breast cancer Maternal Grandmother   . Breast cancer Paternal Grandmother   . Breast cancer Paternal Aunt   . Kidney cancer Maternal Uncle   . Prostate cancer Paternal Grandfather   . Kidney disease      father's side    Social History Social History  Substance Use Topics  . Smoking status: Former Research scientist (life sciences)  . Smokeless tobacco: Not on file     Comment: quit 2008  . Alcohol use No     Comment: ocassionally    Review of Systems Constitutional: No fever/chills Eyes: No visual changes. ENT: No sore throat. Cardiovascular: Denies chest pain. Respiratory: +shortness of breath. Gastrointestinal: No abdominal pain.  No nausea, no vomiting.  No diarrhea.  No constipation. Genitourinary: Negative for dysuria. Musculoskeletal: Negative for back pain. Skin: +rash. Neurological: Negative for headaches, focal weakness or numbness.  10-point ROS  otherwise negative.  ____________________________________________   PHYSICAL EXAM:  VITAL SIGNS: ED Triage Vitals  Enc Vitals Group     BP 07/04/16 1602 132/70     Pulse Rate 07/04/16 1602 80     Resp 07/04/16 1602 20     Temp --      Temp Source 07/04/16 1602 Oral     SpO2 07/04/16 1602 95 %     Weight 07/04/16 1616 165 lb (74.8 kg)     Height 07/04/16 1616 5\' 5"  (1.651 m)     Head Circumference --      Peak Flow --      Pain Score 07/04/16 1616 5     Pain Loc --      Pain Edu? --      Excl. in [place]? --     Constitutional: Alert and oriented. Well appearing and in no acute distress. Eyes: Conjunctivae are normal. PERRL. EOMI. Head: Atraumatic. Nose: No congestion/rhinnorhea. Mouth/Throat: Mucous membranes are moist.  Oropharynx non-erythematous. Neck: No stridor.  No meningeal signs.   Cardiovascular: Normal rate, regular rhythm. Good peripheral circulation. Grossly normal heart sounds. Respiratory: Normal  respiratory effort.  No retractions. Lungs CTAB. Gastrointestinal: Soft and nontender. No distention.  Musculoskeletal: No lower extremity tenderness nor edema. No gross deformities of extremities. Neurologic:  Normal speech and language. No gross focal neurologic deficits are appreciated.  Skin:  Skin is warm, dry and intact. Scattered urticaria throughout skin surfaces. Psychiatric: Mood and affect are normal. Speech and behavior are normal.  ____________________________________________   LABS (all labs ordered are listed, but only abnormal results are displayed)  Labs Reviewed  COMPREHENSIVE METABOLIC PANEL - Abnormal; Notable for the following:       Result Value   Sodium 134 (*)    Potassium 3.0 (*)    Chloride 96 (*)    Glucose, Bld 102 (*)    Calcium 8.7 (*)    All other components within normal limits  CBC - Abnormal; Notable for the following:    WBC 11.9 (*)    RDW 15.1 (*)    All other components within normal limits  TROPONIN I   ____________________________________________  EKG  ED ECG REPORT I, Kenedee Molesky, the attending physician, personally viewed and interpreted this ECG.  Date: 07/04/2016 EKG Time: 15:58 Rate: 82 Rhythm: normal sinus rhythm QRS Axis: normal Intervals: normal ST/T Wave abnormalities: normal Conduction Disturbances: none Narrative Interpretation: unremarkable  ____________________________________________  RADIOLOGY   Dg Chest 2 View  Result Date: 07/04/2016 CLINICAL DATA:  Dizziness, lightheadedness, rash, sore throat, shortness of breath with exertion, chest discomfort EXAM: CHEST  2 VIEW COMPARISON:  10/27/2012 FINDINGS: Lungs are clear.  No pleural effusion or pneumothorax. The heart is normal in size. Visualized osseous structures are within normal limits. IMPRESSION: Normal chest radiographs. Electronically Signed   By: Julian Hy M.D.   On: 07/04/2016 16:40     ____________________________________________   PROCEDURES  Procedure(s) performed:   Procedures   Critical Care performed: No ____________________________________________   INITIAL IMPRESSION / ASSESSMENT AND PLAN / ED COURSE  Pertinent labs & imaging results that were available during my care of the patient were reviewed by me and considered in my medical decision making (see chart for details).  Patient is in no acute distress at this point has completely clear lung sounds.  Her vitals are normal and her workup is unremarkable.  She is under the care of an outpatient allergist and there is no indication of anaphylaxis nor  of any other acute or emergent medical condition.  Her troponin is negative and there is no need to repeat if she has had the symptoms since having her acute allergic reaction more than 24 hours ago.  I discussed all this with the patient and her partner and they understand and agree with the plan.    ____________________________________________  FINAL CLINICAL IMPRESSION(S) / ED DIAGNOSES  Final diagnoses:  Allergic reaction, initial encounter  Atypical chest pain     MEDICATIONS GIVEN DURING THIS VISIT:  Medications - No data to display   NEW OUTPATIENT MEDICATIONS STARTED DURING THIS VISIT:  Discharge Medication List as of 07/04/2016  8:15 PM        Note:  This document was prepared using Dragon voice recognition software and may include unintentional dictation errors.    Hinda Kehr, MD 07/05/16 0000

## 2016-07-09 ENCOUNTER — Other Ambulatory Visit: Payer: 59

## 2016-07-10 DIAGNOSIS — M7730 Calcaneal spur, unspecified foot: Secondary | ICD-10-CM | POA: Insufficient documentation

## 2016-07-10 DIAGNOSIS — M722 Plantar fascial fibromatosis: Secondary | ICD-10-CM | POA: Insufficient documentation

## 2016-08-03 NOTE — H&P (Signed)
Kristine [name] is a 45 y.o. female here for Laparoscopic supracervical hysterectomy and bilateral salpingectomy  For menorrhagia . Recent  saline sono  .embx possible polyp . No cancer U/s  Normal today . Difficulty measuring the stripe .  Saline by me poorly tolerated by pt . No clear endometrial pathology  Pap negative    Past Medical History:  has a past medical history of ADD (attention deficit disorder); Allergic rhinitis; Anemia; Anxiety; Arrhythmia; Asthma without status asthmaticus; Breast lump (2014); Depression; Fibroids; GERD (gastroesophageal reflux disease); H/O esophageal ulcer; H/O migraine; H/O urinary tract infection; H/O: pneumonia (2003); Infection of both inner ears (08/06/2015); and VSD (ventricular septal defect).  Past Surgical History:  has a past surgical history that includes Functional endoscopic sinus surgery; Uterine Fibroid Resection (2012); Cardiac Catheterization (12/13); hc breast bx loc dev w/stereo guide addl (2014); Tonsillectomy; Tubal ligation (2003); Colonoscopy (2006); egd; Endometrial Biopsy (11/14); and Cesarean section. Family History: family history includes Alcohol abuse in her mother; Asthma in her father; Bone cancer in her other; Breast cancer in her maternal grandmother and paternal grandmother; Cerebral aneurysm in her mother; Heart attack in her father; Hypertension in her father and mother; Lung cancer in her father; Migraines in her mother; Ovarian cancer in her maternal grandmother; Skin cancer in her father. Social History:  reports that she quit smoking about 9 years ago. She has never used smokeless tobacco. She reports that she drinks alcohol. She reports that she does not use illicit drugs. OB/GYN History:  OB History    Gravida Para Term Preterm AB Living   4 2   2     SAB TAB Ectopic Multiple Live Births   1 1         Allergies: is allergic to bee pollen; egg; keflex [cephalexin]; penicillins; soy; codeine; ferrous gluconate;  hydrocodone; iron; iron analogues; lactase; latex; shellfish containing products; and wheat bran. Medications:  Current Outpatient Prescriptions:  .  albuterol (VENTOLIN HFA) 90 mcg/actuation inhaler, Inhale 2 inhalations into the lungs every 4 (four) hours as needed., Disp: 2 Inhaler, Rfl: 3 .  azelastine (ASTEPRO) 0.15 % (205.5 mcg) nasal spray, Place 2 sprays into both nostrils 2 (two) times daily.  , Disp: , Rfl:  .  BIOTIN ORAL, Take 1 tablet by mouth once daily., Disp: , Rfl:  .  cetirizine (ZYRTEC) 10 MG tablet, Take 20 mg by mouth 2 (two) times daily.  , Disp: , Rfl:  .  cholecalciferol (CHOLECALCIFEROL) 1,000 unit tablet, Take 1 tablet (1,000 Units total) by mouth once daily., Disp: 30 tablet, Rfl: 12 .  doxycycline (VIBRAMYCIN) 100 MG capsule, Take 1 capsule (100 mg total) by mouth 2 (two) times daily for 10 days., Disp: 20 capsule, Rfl: 0 .  fluticasone (FLONASE) 50 mcg/actuation nasal spray, Place 2 sprays into both nostrils as needed for Rhinitis., Disp: , Rfl:  .  fluticasone (FLOVENT HFA) 220 mcg/actuation inhaler, Inhale 1 inhalation into the lungs 2 (two) times daily., Disp: , Rfl:  .  hydroCHLOROthiazide (HYDRODIURIL) 12.5 MG tablet, TAKE ONE TABLET BY MOUTH ONCE DAILY, Disp: 30 tablet, Rfl: 0 .  hydroxychloroquine (PLAQUENIL) 200 mg tablet, Take 400 mg by mouth once daily., Disp: , Rfl:  .  hydrOXYzine (ATARAX) 25 MG tablet, Take 25 mg by mouth 3 (three) times daily as needed for Itching., Disp: , Rfl:  .  montelukast (SINGULAIR) 10 mg tablet, Take 10 mg by mouth 2 (two) times daily.  , Disp: , Rfl:  .  multivitamin Chew, Take  1 tablet by mouth once daily. , Disp: , Rfl:  .  neomycin-polymyxin-hydrocortisone (CORTISPORIN) otic solution, 3 drops to affected ear(s) 4 times per day x 7-10 days, Disp: 10 mL, Rfl: 0 .  nitrofurantoin, macrocrystal-monohydrate, (MACROBID) 100 MG capsule, Take 1 capsule (100 mg total) by mouth once daily., Disp: 30 capsule, Rfl: 6 .  ofloxacin  (FLOXIN) 400 MG tablet, Take 1 tablet (400 mg total) by mouth every 12 (twelve) hours for 10 days., Disp: 20 tablet, Rfl: 0 .  PREDNISONE ORAL, Take 17 mg by mouth.  , Disp: , Rfl:  .  ranitidine (ZANTAC) 150 MG tablet, Take 150 mg by mouth 2 (two) times daily., Disp: , Rfl:  .  terbinafine HCl (LAMISIL) 250 mg tablet, Take 1 tablet (250 mg total) by mouth once daily., Disp: 30 tablet, Rfl: 4 .  VITAMIN B COMPLEX ORAL, Take 1 tablet by mouth once daily., Disp: , Rfl:   Review of Systems: General:                                          No fatigue or weight loss Eyes:                                                         No vision changes Ears:                                                          No hearing difficulty Respiratory:                No cough or shortness of breath Pulmonary:                                      No asthma or shortness of breath Cardiovascular:                     No chest pain, palpitations, dyspnea on exertion Gastrointestinal:                    No abdominal bloating, chronic diarrhea, constipations, masses, pain or hematochezia Genitourinary:                                 No hematuria, dysuria, abnormal vaginal discharge, pelvic pain, Menometrorrhagia Lymphatic:                                       No swollen lymph nodes Musculoskeletal:                   No muscle weakness Neurologic:  No extremity weakness, syncope, seizure disorder Psychiatric:                                      No history of depression, delusions or suicidal/homicidal ideation    Exam:      Vitals:   08/02/16 1444  BP: 104/74  Pulse: 81    Body mass index is 30.18 kg/(m^2).  WDWN white/  female in NAD   Lungs: CTA  CV : RRR without murmur   Abdomen: soft , no mass, normal active bowel sounds,  non-tender, no rebound tenderness Pelvic: tanner stage 5 ,  External genitalia: vulva /labia no lesions Urethra: no  prolapse Vagina: normal physiologic d/c Cervix: no lesions, no cervical motion tenderness   Uterus: normal size shape and contour, non-tender Adnexa: no mass,  non-tender     Impression:   The primary encounter diagnosis was Screening for cervical cancer. Diagnoses of Menorrhagia with irregular cycle and Dysmenorrhea were also pertinent to this visit. Possible endometrial polyp    Plan:   Possible endometrial polyp  I have spoken with the patient regarding treatment options including expectant management, hormonal options, or surgical intervention. After a full discussion the pt elects to proceed with [facility] and bilateral salpingectomy . Pt is aware of the potential risk of leiomyosarcoma dissemination  from  [place] .  general risks of the procedure have been discussed with the pt , including organ injury and  Infection , and possible blood loss        Orders Placed This Encounter  Procedures  . Pap IG, HPV-hr - [facility]    Order Specific Question:   [facility] Specimen source for cytology test:    Answer:   Cervical    Order Specific Question:   [facility] Collection method:    Answer:   Bryan Lemma    Return if symptoms worsen or fail to improve, for preop.  Caroline Sauger, MD       Instructions      Return if symptoms worsen or fail to improve, for preop.  Patient Instructions   Supracervical Hysterectomy A supracervical hysterectomy is surgery to remove the top part of the uterus, but not the cervix. You will no longer have menstrual periods or be able to get pregnant after this surgery. The fallopian tubes and ovaries may also be removed (bilateral salpingo-oophorectomy) during this surgery. This surgery is usually performed using a minimally invasive technique called laparoscopy. This technique allows the surgery to be done through small incisions. The minimally invasive technique provides benefits such as less pain, less risk of infection, and  shorter recovery time. LET [facility] CARE PROVIDER KNOW ABOUT:  Any allergies you have.  All medicines you are taking, including vitamins, herbs, eye drops, creams, and over-the-counter medicines.  Previous problems you or members of your family have had with the use of anesthetics.  Any blood disorders you have.  Previous surgeries you have had.  Medical conditions you have. RISKS AND COMPLICATIONS  Generally, this is a safe procedure. However, as with any procedure, complications can occur. Possible complications include:  Bleeding.  Blood clots in the legs or lung.  Infection.  Injury to surrounding organs.  Problems related to anesthesia.  Conversion to an open abdominal surgery.  Additional surgery later to remove the cervix if you have problems with the cervix. BEFORE THE PROCEDURE  Ask your health care provider about changing  or stopping your regular medicines.  Do not take aspirin or blood thinners (anticoagulants) for 1 week before the surgery, or as directed by your health care provider.  Do not eat or drink anything for 8 hours before the surgery, or as directed by your health care provider.  Quit smoking if you smoke.  Arrange for a ride home after surgery and for someone to help you at home during recovery. PROCEDURE   You will be given an antibiotic medicine.  An IV tube will be placed in one of your veins. You will be given medicine to make you sleep (general anesthetic).  A gas (carbon dioxide) will be used to inflate your abdomen. This will allow your surgeon to look inside your abdomen, perform your surgery, and treat any other problems found if necessary.  Three or four small incisions will be made in your abdomen. One of these incisions will be made in the area of your belly button (navel). A thin, flexible tube with a tiny camera and light on the end of it (laparoscope) will be inserted into the incision. The camera on the laparoscope sends a  picture to a TV screen in the operating room. This gives your surgeon a good view inside the abdomen.  Other surgical instruments will be inserted through the other incisions.  The uterus will be cut into small pieces and removed through the small incisions.  Your incisions will be closed. AFTER THE PROCEDURE   You will be taken to a recovery area where your progress will be monitored until you are awake, stable, and taking fluids well. If there are no other problems, you will then be moved to a regular hospital room, or you will be allowed to go home.  You will likely have minimal discomfort after the surgery because the incisions are so small with the laparoscopic technique.  You will be given pain medicine while you are in the hospital and for when you go home.  If a bilateral salpingo-oophorectomy was performed before menopause, you will go through a sudden (abrupt) menopause. This can be helped with hormone medicines.  This information is not intended to replace advice given to you by your health care provider. Make sure you discuss any questions you have with your health care provider.  Document Released: 04/16/2008 Document Revised: 08/19/2013 Document Reviewed: 05/01/2013 Elsevier Interactive Patient Education 2017 [employer].         Orders Placed      Pap IG, HPV-hr - [facility]  Medication Changes     None    Medication List  V

## 2016-08-07 ENCOUNTER — Other Ambulatory Visit: Payer: 59

## 2016-08-10 ENCOUNTER — Telehealth: Payer: Self-pay

## 2016-08-10 NOTE — Telephone Encounter (Signed)
Pt called stating she received pap smear results today that came back positive for HPV. Pt then went on to say that as she read up on the internet this could cause blood in the urine. Reinforced with pt the vagina and bladder are 2 different organs. Therefore due to her positive pap she should follow the direction of GYN provider. Pt voiced understanding.

## 2016-08-13 ENCOUNTER — Encounter: Admission: RE | Payer: Self-pay | Source: Ambulatory Visit

## 2016-08-13 ENCOUNTER — Ambulatory Visit: Admission: RE | Admit: 2016-08-13 | Payer: 59 | Source: Ambulatory Visit | Admitting: Obstetrics and Gynecology

## 2016-08-13 SURGERY — HYSTERECTOMY, SUPRACERVICAL, LAPAROSCOPIC
Anesthesia: General

## 2016-08-13 NOTE — H&P (Signed)
Kristine [name] is a 45 y.o scheduled for a LAVH / bilateral salpingectomy  For menorrhagia . Heavy bleeding for > 1 yr. Changing pads  q hr and bleeds 2x/ month .+ dysmenorrhea . No dyspareunia .  .  .embx possible polyp . No cancer U/s  shows: Difficulty measuring the stripe .  Saline by me poorly tolerated by pt . No clear endometrial pathology  recent Pap showed LGSIL and +HR HPV ( colposcopic eval on 08/13/16) s/p myomectomy Cardiac clearance pending 08/15/16 Past Medical History:  has a past medical history of ADD (attention deficit disorder); Allergic rhinitis; Anemia; Anxiety; Arrhythmia; Asthma without status asthmaticus; Breast lump (2014); Depression; Fibroids; GERD (gastroesophageal reflux disease); H/O esophageal ulcer; H/O migraine; H/O urinary tract infection; H/O: pneumonia (2003); Infection of both inner ears (08/06/2015); and VSD (ventricular septal defect).  Past Surgical History:  has a past surgical history that includes Functional endoscopic sinus surgery; Uterine Fibroid Resection (2012); Cardiac Catheterization (12/13); hc breast bx loc dev w/stereo guide addl (2014); Tonsillectomy; Tubal ligation (2003); Colonoscopy (2006); egd; Endometrial Biopsy (11/14); and Cesarean section. Family History: family history includes Alcohol abuse in her mother; Asthma in her father; Bone cancer in her other; Breast cancer in her maternal grandmother and paternal grandmother; Cerebral aneurysm in her mother; Heart attack in her father; Hypertension in her father and mother; Lung cancer in her father; Migraines in her mother; Ovarian cancer in her maternal grandmother; Skin cancer in her father. Social History:  reports that she quit smoking about 9 years ago. She has never used smokeless tobacco. She reports that she drinks alcohol. She reports that she does not use illicit drugs. OB/GYN History:  OB History    Gravida Para Term Preterm AB Living   4 2   2     SAB TAB Ectopic Multiple Live  Births   1 1         Allergies: is allergic to bee pollen; egg; keflex [cephalexin]; penicillins; soy; codeine; ferrous gluconate; hydrocodone; iron; iron analogues; lactase; latex; shellfish containing products; and wheat bran. Medications:  Current Outpatient Prescriptions:  .  albuterol (VENTOLIN HFA) 90 mcg/actuation inhaler, Inhale 2 inhalations into the lungs every 4 (four) hours as needed., Disp: 2 Inhaler, Rfl: 3 .  azelastine (ASTEPRO) 0.15 % (205.5 mcg) nasal spray, Place 2 sprays into both nostrils 2 (two) times daily.  , Disp: , Rfl:  .  BIOTIN ORAL, Take 1 tablet by mouth once daily., Disp: , Rfl:  .  cetirizine (ZYRTEC) 10 MG tablet, Take 20 mg by mouth 2 (two) times daily.  , Disp: , Rfl:  .  cholecalciferol (CHOLECALCIFEROL) 1,000 unit tablet, Take 1 tablet (1,000 Units total) by mouth once daily., Disp: 30 tablet, Rfl: 12 .  doxycycline (VIBRAMYCIN) 100 MG capsule, Take 1 capsule (100 mg total) by mouth 2 (two) times daily for 10 days., Disp: 20 capsule, Rfl: 0 .  fluticasone (FLONASE) 50 mcg/actuation nasal spray, Place 2 sprays into both nostrils as needed for Rhinitis., Disp: , Rfl:  .  fluticasone (FLOVENT HFA) 220 mcg/actuation inhaler, Inhale 1 inhalation into the lungs 2 (two) times daily., Disp: , Rfl:  .  hydroCHLOROthiazide (HYDRODIURIL) 12.5 MG tablet, TAKE ONE TABLET BY MOUTH ONCE DAILY, Disp: 30 tablet, Rfl: 0 .  hydroxychloroquine (PLAQUENIL) 200 mg tablet, Take 400 mg by mouth once daily., Disp: , Rfl:  .  hydrOXYzine (ATARAX) 25 MG tablet, Take 25 mg by mouth 3 (three) times daily as needed for Itching., Disp: , Rfl:  .  montelukast (SINGULAIR) 10 mg tablet, Take 10 mg by mouth 2 (two) times daily.  , Disp: , Rfl:  .  multivitamin Chew, Take 1 tablet by mouth once daily. , Disp: , Rfl:  .  neomycin-polymyxin-hydrocortisone (CORTISPORIN) otic solution, 3 drops to affected ear(s) 4 times per day x 7-10 days, Disp: 10 mL, Rfl: 0 .  nitrofurantoin,  macrocrystal-monohydrate, (MACROBID) 100 MG capsule, Take 1 capsule (100 mg total) by mouth once daily., Disp: 30 capsule, Rfl: 6 .  ofloxacin (FLOXIN) 400 MG tablet, Take 1 tablet (400 mg total) by mouth every 12 (twelve) hours for 10 days., Disp: 20 tablet, Rfl: 0 .  PREDNISONE ORAL, Take 17 mg by mouth.  , Disp: , Rfl:  .  ranitidine (ZANTAC) 150 MG tablet, Take 150 mg by mouth 2 (two) times daily., Disp: , Rfl:  .  terbinafine HCl (LAMISIL) 250 mg tablet, Take 1 tablet (250 mg total) by mouth once daily., Disp: 30 tablet, Rfl: 4 .  VITAMIN B COMPLEX ORAL, Take 1 tablet by mouth once daily., Disp: , Rfl:   Review of Systems: General:                                          No fatigue or weight loss Eyes:                                                         No vision changes Ears:                                                          No hearing difficulty Respiratory:                No cough or shortness of breath Pulmonary:                                      No asthma or shortness of breath Cardiovascular:                     No chest pain, palpitations, dyspnea on exertion Gastrointestinal:                    No abdominal bloating, chronic diarrhea, constipations, masses, pain or hematochezia Genitourinary:                                 No hematuria, dysuria, abnormal vaginal discharge, pelvic pain, Menometrorrhagia Lymphatic:                                       No swollen lymph nodes Musculoskeletal:                   No muscle weakness Neurologic:  No extremity weakness, syncope, seizure disorder Psychiatric:                                      No history of depression, delusions or suicidal/homicidal ideation    Exam:      Vitals:   08/02/16 1444  BP: 104/74  Pulse: 81    Body mass index is 30.18 kg/(m^2).  WDWN white/  female in NAD   Lungs: CTA  CV : RRR without murmur   Abdomen: soft , no mass, normal active bowel  sounds,  non-tender, no rebound tenderness Pelvic: tanner stage 5 ,  External genitalia: vulva /labia no lesions Urethra: no prolapse Vagina: normal physiologic d/c Cervix: no lesions, no cervical motion tenderness   Uterus: normal size shape and contour, non-tender Adnexa: no mass,  non-tender     Impression:   Menorrhagia and dysmenorrhea   Plan:   Possible endometrial polyp  I have spoken with the patient regarding treatment options including expectant management, hormonal options, or surgical intervention. After a full discussion the pt elects to proceed [place] bilateral salpingectomy .  Given her history of 2 prior c/s and myomectomy she is at a higher risk of conversion to TAH .  Risks of the procedure have been discussed with the patient . Alll questions answered.        Orders Placed This Encounter  Procedures

## 2016-08-16 ENCOUNTER — Encounter: Payer: Self-pay | Admitting: *Deleted

## 2016-08-16 ENCOUNTER — Encounter
Admission: RE | Admit: 2016-08-16 | Discharge: 2016-08-16 | Disposition: A | Discharge status: Home / Self Care | Payer: 59 | Source: Ambulatory Visit | Attending: Obstetrics and Gynecology | Admitting: Obstetrics and Gynecology

## 2016-08-16 DIAGNOSIS — Z01812 Encounter for preprocedural laboratory examination: Secondary | ICD-10-CM | POA: Insufficient documentation

## 2016-08-16 LAB — BASIC METABOLIC PANEL
ANION GAP: 7 (ref 5–15)
BUN: 19 mg/dL (ref 6–20)
CALCIUM: 8.8 mg/dL — AB (ref 8.9–10.3)
CHLORIDE: 102 mmol/L (ref 101–111)
CO2: 30 mmol/L (ref 22–32)
Creatinine, Ser: 0.95 mg/dL (ref 0.44–1.00)
GFR calc non Af Amer: >60 mL/min (ref >60)
GLUCOSE: 84 mg/dL (ref 65–99)
Potassium: 3.9 mmol/L (ref 3.5–5.1)
SODIUM: 139 mmol/L (ref 135–145)

## 2016-08-16 LAB — CBC
HEMATOCRIT: 41.1 % (ref 35.0–47.0)
HEMOGLOBIN: 14.1 g/dL (ref 12.0–16.0)
MCH: 29.3 pg (ref 26.0–34.0)
MCHC: 34.3 g/dL (ref 32.0–36.0)
MCV: 85.4 fL (ref 80.0–100.0)
Platelets: 306 10*3/uL (ref 150–440)
RBC: 4.82 MIL/uL (ref 3.80–5.20)
RDW: 14.5 % (ref 11.5–14.5)
WBC: 10.7 10*3/uL (ref 3.6–11.0)

## 2016-08-16 LAB — SURGICAL PCR SCREEN
MRSA, PCR: NEGATIVE
STAPHYLOCOCCUS AUREUS: NEGATIVE

## 2016-08-16 LAB — TYPE AND SCREEN
ABO/RH(D): A POS
Antibody Screen: NEGATIVE

## 2016-08-16 NOTE — Patient Instructions (Addendum)
  Your procedure is scheduled on: 08/20/2016 Report to Same Day Surgery 2nd floor medical mall To find out your arrival time please call [phone] between 1PM - 3PM on 08/17/2016  Remember: Instructions that are not followed completely may result in serious medical risk, up to and including death, or upon the discretion of your surgeon and anesthesiologist your surgery may need to be rescheduled.    _x___ 1. Do not eat food or drink liquids after midnight. No gum chewing or hard candies.     __x__ 2. No Alcohol for 24 hours before or after surgery.   __x__3. No Smoking for 24 prior to surgery.   ____  4. Bring all medications with you on the day of surgery if instructed.    __x__ 5. Notify your doctor if there is any change in your medical condition     (cold, fever, infections).     Do not wear jewelry, make-up, hairpins, clips or nail polish.  Do not wear lotions, powders, or perfumes. You may wear deodorant.  Do not shave 48 hours prior to surgery. Men may shave face and neck.  Do not bring valuables to the hospital.    [facility] is not responsible for any belongings or valuables.               Contacts, dentures or bridgework may not be worn into surgery.  Leave your suitcase in the car. After surgery it may be brought to your room.  For patients admitted to the hospital, discharge time is determined by your treatment team.   Patients discharged the day of surgery will not be allowed to drive home.    Please read over the following fact sheets that you were given:   [facility] Preparing for Surgery and or MRSA Information   _x___ Take these medicines the morning of surgery with A SIP OF WATER:    1. Albuterol inhaler  2. prednisone  3. Zantac  (take one the night before and the day of surgery)  4. Flovent inhaler  5. Flonase nasal spray if needed  6.  ____Fleets enema or Magnesium Citrate as directed.   _x___ Use CHG Soap or sage wipes as directed on instruction  sheet   __X__ Use inhalers on the day of surgery and bring to hospital day of surgery  ____ Stop metformin 2 days prior to surgery    ____ Take 1/2 of usual insulin dose the night before surgery and none on the morning of           surgery.   ____ Stop aspirin or coumadin, or plavix  x__ Stop Anti-inflammatories such as Advil, Aleve, Ibuprofen, Motrin, Naproxen,          Naprosyn, Goodies powders or aspirin products. Ok to take Tylenol.   ____ Stop supplements until after surgery.    ____ Bring C-Pap to the hospital.

## 2016-08-19 MED ORDER — CLINDAMYCIN PHOSPHATE 900 MG/50ML IV SOLN
900.0000 mg | INTRAVENOUS | Status: AC
  Administered 2016-08-20: 900 mg via INTRAVENOUS

## 2016-08-19 MED ORDER — GENTAMICIN SULFATE 40 MG/ML IJ SOLN
INTRAVENOUS | Status: DC
  Filled 2016-08-19: qty 7.5

## 2016-08-19 MED ORDER — GENTAMICIN SULFATE 40 MG/ML IJ SOLN
300.0000 mg | INTRAVENOUS | Status: AC
  Administered 2016-08-20: 300 mg via INTRAVENOUS
  Filled 2016-08-19: qty 7.5

## 2016-08-19 MED ORDER — DEXTROSE 5 % IV SOLN
Freq: Once | INTRAVENOUS | Status: DC
  Filled 2016-08-19: qty 7.5

## 2016-08-20 ENCOUNTER — Encounter
Admission: RE | Disposition: A | Discharge status: Home / Self Care | Payer: Self-pay | Source: Ambulatory Visit | Attending: Obstetrics and Gynecology

## 2016-08-20 ENCOUNTER — Ambulatory Visit: Payer: 59 | Admitting: Anesthesiology

## 2016-08-20 ENCOUNTER — Observation Stay
Admission: RE | Admit: 2016-08-20 | Discharge: 2016-08-21 | Disposition: A | Discharge status: Home / Self Care | Payer: 59 | Source: Ambulatory Visit | Attending: Obstetrics and Gynecology | Admitting: Obstetrics and Gynecology

## 2016-08-20 ENCOUNTER — Encounter: Payer: Self-pay | Admitting: Emergency Medicine

## 2016-08-20 DIAGNOSIS — F988 Other specified behavioral and emotional disorders with onset usually occurring in childhood and adolescence: Secondary | ICD-10-CM | POA: Diagnosis not present

## 2016-08-20 DIAGNOSIS — Z880 Allergy status to penicillin: Secondary | ICD-10-CM | POA: Diagnosis not present

## 2016-08-20 DIAGNOSIS — Z91040 Latex allergy status: Secondary | ICD-10-CM | POA: Insufficient documentation

## 2016-08-20 DIAGNOSIS — K219 Gastro-esophageal reflux disease without esophagitis: Secondary | ICD-10-CM | POA: Diagnosis not present

## 2016-08-20 DIAGNOSIS — Z91013 Allergy to seafood: Secondary | ICD-10-CM | POA: Diagnosis not present

## 2016-08-20 DIAGNOSIS — Z9889 Other specified postprocedural states: Secondary | ICD-10-CM

## 2016-08-20 DIAGNOSIS — Z91030 Bee allergy status: Secondary | ICD-10-CM | POA: Insufficient documentation

## 2016-08-20 DIAGNOSIS — Z91012 Allergy to eggs: Secondary | ICD-10-CM | POA: Diagnosis not present

## 2016-08-20 DIAGNOSIS — Z888 Allergy status to other drugs, medicaments and biological substances status: Secondary | ICD-10-CM | POA: Insufficient documentation

## 2016-08-20 DIAGNOSIS — N838 Other noninflammatory disorders of ovary, fallopian tube and broad ligament: Secondary | ICD-10-CM | POA: Diagnosis not present

## 2016-08-20 DIAGNOSIS — N870 Mild cervical dysplasia: Secondary | ICD-10-CM | POA: Insufficient documentation

## 2016-08-20 DIAGNOSIS — F419 Anxiety disorder, unspecified: Secondary | ICD-10-CM | POA: Insufficient documentation

## 2016-08-20 DIAGNOSIS — N920 Excessive and frequent menstruation with regular cycle: Secondary | ICD-10-CM | POA: Diagnosis not present

## 2016-08-20 DIAGNOSIS — F329 Major depressive disorder, single episode, unspecified: Secondary | ICD-10-CM | POA: Insufficient documentation

## 2016-08-20 DIAGNOSIS — N800 Endometriosis of uterus: Secondary | ICD-10-CM | POA: Diagnosis not present

## 2016-08-20 DIAGNOSIS — J45909 Unspecified asthma, uncomplicated: Secondary | ICD-10-CM | POA: Diagnosis not present

## 2016-08-20 DIAGNOSIS — Z885 Allergy status to narcotic agent status: Secondary | ICD-10-CM | POA: Diagnosis not present

## 2016-08-20 DIAGNOSIS — D251 Intramural leiomyoma of uterus: Secondary | ICD-10-CM | POA: Diagnosis not present

## 2016-08-20 HISTORY — DX: Adverse effect of unspecified anesthetic, initial encounter: T41.45XA

## 2016-08-20 HISTORY — PX: LAPAROSCOPIC BILATERAL SALPINGECTOMY: SHX5889

## 2016-08-20 HISTORY — PX: LAPAROSCOPIC ASSISTED VAGINAL HYSTERECTOMY: SHX5398

## 2016-08-20 HISTORY — DX: Other complications of anesthesia, initial encounter: T88.59XA

## 2016-08-20 LAB — ABO/RH: ABO/RH(D): A POS

## 2016-08-20 LAB — POCT PREGNANCY, URINE: Preg Test, Ur: NEGATIVE

## 2016-08-20 SURGERY — HYSTERECTOMY, VAGINAL, LAPAROSCOPY-ASSISTED
Anesthesia: General | Site: Abdomen | Wound class: Clean Contaminated

## 2016-08-20 MED ORDER — ACETAMINOPHEN 10 MG/ML IV SOLN
INTRAVENOUS | Status: AC
  Filled 2016-08-20: qty 100

## 2016-08-20 MED ORDER — PROPOFOL 10 MG/ML IV BOLUS
INTRAVENOUS | Status: DC | PRN
  Administered 2016-08-20: 160 mg via INTRAVENOUS

## 2016-08-20 MED ORDER — FENTANYL CITRATE (PF) 100 MCG/2ML IJ SOLN
INTRAMUSCULAR | Status: DC | PRN
  Administered 2016-08-20: 100 ug via INTRAVENOUS

## 2016-08-20 MED ORDER — LACTATED RINGERS IV SOLN
INTRAVENOUS | Status: DC
  Administered 2016-08-20 (×2): via INTRAVENOUS

## 2016-08-20 MED ORDER — ALVIMOPAN 12 MG PO CAPS
ORAL_CAPSULE | ORAL | Status: AC
  Filled 2016-08-20: qty 2

## 2016-08-20 MED ORDER — ACETAMINOPHEN 10 MG/ML IV SOLN
INTRAVENOUS | Status: DC | PRN
  Administered 2016-08-20: 1000 mg via INTRAVENOUS

## 2016-08-20 MED ORDER — FENTANYL CITRATE (PF) 100 MCG/2ML IJ SOLN: 25.0000 ug | INTRAMUSCULAR | Status: DC | PRN

## 2016-08-20 MED ORDER — MIDAZOLAM HCL 2 MG/2ML IJ SOLN
INTRAMUSCULAR | Status: AC
  Administered 2016-08-20: 2 mg via INTRAVENOUS
  Filled 2016-08-20: qty 2

## 2016-08-20 MED ORDER — SODIUM CHLORIDE 0.9 % IJ SOLN
INTRAMUSCULAR | Status: AC
  Filled 2016-08-20: qty 10

## 2016-08-20 MED ORDER — HYDROXYZINE HCL 25 MG PO TABS
25.0000 mg | ORAL_TABLET | ORAL | Status: DC | PRN
  Administered 2016-08-20 (×2): 25 mg via ORAL
  Filled 2016-08-20 (×3): qty 1

## 2016-08-20 MED ORDER — LACTATED RINGERS IV SOLN: INTRAVENOUS | Status: DC

## 2016-08-20 MED ORDER — MEPERIDINE HCL 25 MG/ML IJ SOLN
50.0000 mg | INTRAMUSCULAR | Status: DC | PRN
  Administered 2016-08-20 – 2016-08-21 (×8): 50 mg via INTRAVENOUS
  Filled 2016-08-20 (×8): qty 2

## 2016-08-20 MED ORDER — DIPHENHYDRAMINE HCL 50 MG/ML IJ SOLN
25.0000 mg | Freq: Four times a day (QID) | INTRAMUSCULAR | Status: DC | PRN
  Administered 2016-08-20: 25 mg via INTRAVENOUS
  Filled 2016-08-20: qty 1

## 2016-08-20 MED ORDER — HYDROMORPHONE HCL 1 MG/ML IJ SOLN
INTRAMUSCULAR | Status: AC
  Filled 2016-08-20: qty 1

## 2016-08-20 MED ORDER — LIDOCAINE-EPINEPHRINE 1 %-1:100000 IJ SOLN
INTRAMUSCULAR | Status: AC
  Filled 2016-08-20: qty 1

## 2016-08-20 MED ORDER — PROMETHAZINE HCL 25 MG/ML IJ SOLN
INTRAMUSCULAR | Status: AC
  Filled 2016-08-20: qty 1

## 2016-08-20 MED ORDER — MEPERIDINE HCL 100 MG/ML IJ SOLN
100.0000 mg | INTRAMUSCULAR | Status: DC | PRN
  Administered 2016-08-20: 100 mg via INTRAVENOUS
  Filled 2016-08-20 (×3): qty 1

## 2016-08-20 MED ORDER — HYDROMORPHONE HCL 1 MG/ML IJ SOLN
INTRAMUSCULAR | Status: DC | PRN
  Administered 2016-08-20: .1 mg via INTRAVENOUS
  Administered 2016-08-20 (×2): .2 mg via INTRAVENOUS
  Administered 2016-08-20: .3 mg via INTRAVENOUS

## 2016-08-20 MED ORDER — LACTATED RINGERS IV SOLN
INTRAVENOUS | Status: DC
  Administered 2016-08-21: 02:00:00 via INTRAVENOUS

## 2016-08-20 MED ORDER — SUGAMMADEX SODIUM 200 MG/2ML IV SOLN
INTRAVENOUS | Status: DC | PRN
  Administered 2016-08-20: 150 mg via INTRAVENOUS

## 2016-08-20 MED ORDER — ONDANSETRON HCL 4 MG/2ML IJ SOLN: 4.0000 mg | Freq: Once | INTRAMUSCULAR | Status: DC | PRN

## 2016-08-20 MED ORDER — DEXAMETHASONE SODIUM PHOSPHATE 10 MG/ML IJ SOLN
INTRAMUSCULAR | Status: DC | PRN
  Administered 2016-08-20: 10 mg via INTRAVENOUS

## 2016-08-20 MED ORDER — LIDOCAINE-EPINEPHRINE 1 %-1:100000 IJ SOLN
INTRAMUSCULAR | Status: DC | PRN
  Administered 2016-08-20: 10 mL

## 2016-08-20 MED ORDER — ROCURONIUM BROMIDE 100 MG/10ML IV SOLN
INTRAVENOUS | Status: DC | PRN
  Administered 2016-08-20: 25 mg via INTRAVENOUS
  Administered 2016-08-20: 5 mg via INTRAVENOUS

## 2016-08-20 MED ORDER — ONDANSETRON HCL 4 MG/2ML IJ SOLN
INTRAMUSCULAR | Status: DC | PRN
  Administered 2016-08-20: 4 mg via INTRAVENOUS

## 2016-08-20 MED ORDER — MIDAZOLAM HCL 2 MG/2ML IJ SOLN
INTRAMUSCULAR | Status: DC | PRN
  Administered 2016-08-20: 2 mg via INTRAVENOUS

## 2016-08-20 MED ORDER — MIDAZOLAM HCL 2 MG/2ML IJ SOLN
2.0000 mg | Freq: Once | INTRAMUSCULAR | Status: AC
  Administered 2016-08-20: 2 mg via INTRAVENOUS

## 2016-08-20 MED ORDER — PENTAFLUOROPROP-TETRAFLUOROETH EX AERO
INHALATION_SPRAY | CUTANEOUS | Status: AC
  Filled 2016-08-20: qty 30

## 2016-08-20 MED ORDER — ACETAMINOPHEN 500 MG PO TABS
1000.0000 mg | ORAL_TABLET | Freq: Four times a day (QID) | ORAL | Status: DC | PRN
Start: 2016-08-20 — End: 2016-08-21
  Administered 2016-08-20 – 2016-08-21 (×2): 1000 mg via ORAL
  Filled 2016-08-20 (×2): qty 2

## 2016-08-20 MED ORDER — CLINDAMYCIN PHOSPHATE 900 MG/50ML IV SOLN
INTRAVENOUS | Status: AC
  Filled 2016-08-20: qty 50

## 2016-08-20 MED ORDER — ONDANSETRON HCL 4 MG/2ML IJ SOLN
4.0000 mg | Freq: Four times a day (QID) | INTRAMUSCULAR | Status: DC | PRN
  Administered 2016-08-20 – 2016-08-21 (×3): 4 mg via INTRAVENOUS
  Filled 2016-08-20 (×3): qty 2

## 2016-08-20 MED ORDER — HYDROMORPHONE HCL 1 MG/ML IJ SOLN
0.2500 mg | INTRAMUSCULAR | Status: DC | PRN
  Administered 2016-08-20 (×4): 0.25 mg via INTRAVENOUS
  Administered 2016-08-20: 0.5 mg via INTRAVENOUS
  Administered 2016-08-20 (×2): 0.25 mg via INTRAVENOUS

## 2016-08-20 MED ORDER — LIDOCAINE HCL (CARDIAC) 20 MG/ML IV SOLN
INTRAVENOUS | Status: DC | PRN
  Administered 2016-08-20: 40 mg via INTRAVENOUS

## 2016-08-20 MED ORDER — BUPIVACAINE HCL 0.5 % IJ SOLN
INTRAMUSCULAR | Status: DC | PRN
  Administered 2016-08-20: 18 mL

## 2016-08-20 MED ORDER — BUPIVACAINE HCL (PF) 0.5 % IJ SOLN
INTRAMUSCULAR | Status: AC
  Filled 2016-08-20: qty 30

## 2016-08-20 MED ORDER — KETOROLAC TROMETHAMINE 30 MG/ML IJ SOLN
30.0000 mg | Freq: Three times a day (TID) | INTRAMUSCULAR | Status: DC | PRN
  Administered 2016-08-20 – 2016-08-21 (×2): 30 mg via INTRAVENOUS
  Filled 2016-08-20 (×2): qty 1

## 2016-08-20 MED ORDER — PROMETHAZINE HCL 25 MG/ML IJ SOLN
12.5000 mg | Freq: Four times a day (QID) | INTRAMUSCULAR | Status: DC | PRN
  Administered 2016-08-20 – 2016-08-21 (×3): 12.5 mg via INTRAVENOUS
  Filled 2016-08-20 (×2): qty 1

## 2016-08-20 MED ORDER — ONDANSETRON HCL 4 MG PO TABS: 4.0000 mg | ORAL_TABLET | Freq: Four times a day (QID) | ORAL | Status: DC | PRN

## 2016-08-20 MED ORDER — DIPHENHYDRAMINE HCL 25 MG PO CAPS: 50.0000 mg | ORAL_CAPSULE | ORAL | Status: DC | PRN

## 2016-08-20 SURGICAL SUPPLY — 50 items
ADH SKN CLS APL DERMABOND .7 (GAUZE/BANDAGES/DRESSINGS) ×3
BAG URO DRAIN 2000ML W/SPOUT (MISCELLANEOUS) ×4 IMPLANT
BLADE SURG SZ11 CARB STEEL (BLADE) ×4 IMPLANT
CATH FOLEY 2WAY  5CC 16FR (CATHETERS) ×1
CATH FOLEY 2WAY 5CC 16FR (CATHETERS) ×3
CATH URTH 16FR FL 2W BLN LF (CATHETERS) ×3 IMPLANT
CHLORAPREP W/TINT 26ML (MISCELLANEOUS) ×4 IMPLANT
DERMABOND ADVANCED (GAUZE/BANDAGES/DRESSINGS) ×1
DERMABOND ADVANCED .7 DNX12 (GAUZE/BANDAGES/DRESSINGS) ×1 IMPLANT
DRAPE SURG 17X11 SM STRL (DRAPES) ×4 IMPLANT
DRSG TEGADERM 2X2.25 PEDS (GAUZE/BANDAGES/DRESSINGS) ×4 IMPLANT
ELECT REM PT RETURN 9FT ADLT (ELECTROSURGICAL) ×4
ELECTRODE REM PT RTRN 9FT ADLT (ELECTROSURGICAL) ×3 IMPLANT
FILTER LAP SMOKE EVAC STRL (MISCELLANEOUS) ×4 IMPLANT
GLOVE BIO SURGEON STRL SZ8 (GLOVE) ×16 IMPLANT
GOWN STRL REUS W/ TWL LRG LVL3 (GOWN DISPOSABLE) ×9 IMPLANT
GOWN STRL REUS W/ TWL XL LVL3 (GOWN DISPOSABLE) ×9 IMPLANT
GOWN STRL REUS W/TWL LRG LVL3 (GOWN DISPOSABLE) ×12
GOWN STRL REUS W/TWL XL LVL3 (GOWN DISPOSABLE) ×12
HANDLE YANKAUER SUCT BULB TIP (MISCELLANEOUS) ×4 IMPLANT
IRRIGATION STRYKERFLOW (MISCELLANEOUS) ×3 IMPLANT
IRRIGATOR STRYKERFLOW (MISCELLANEOUS) ×4
IV NS 1000ML (IV SOLUTION) ×4
IV NS 1000ML BAXH (IV SOLUTION) ×1 IMPLANT
KIT PINK PAD W/HEAD ARE REST (MISCELLANEOUS) ×4
KIT PINK PAD W/HEAD ARM REST (MISCELLANEOUS) ×3 IMPLANT
KIT RM TURNOVER CYSTO AR (KITS) ×4 IMPLANT
LABEL OR SOLS (LABEL) ×4 IMPLANT
LIQUID BAND (GAUZE/BANDAGES/DRESSINGS) ×4 IMPLANT
NS IRRIG 1000ML POUR BTL (IV SOLUTION) ×2 IMPLANT
PACK BASIN MINOR ARMC (MISCELLANEOUS) ×4 IMPLANT
PACK GYN LAPAROSCOPIC (MISCELLANEOUS) ×4 IMPLANT
PAD OB MATERNITY 4.3X12.25 (PERSONAL CARE ITEMS) ×4 IMPLANT
SCISSORS METZENBAUM CVD 33 (INSTRUMENTS) ×4 IMPLANT
SHEARS HARMONIC ACE PLUS 36CM (ENDOMECHANICALS) ×4 IMPLANT
SLEEVE ENDOPATH XCEL 5M (ENDOMECHANICALS) ×4 IMPLANT
SPONGE XRAY 4X4 16PLY STRL (MISCELLANEOUS) ×4 IMPLANT
STRIP CLOSURE SKIN 1/2X4 (GAUZE/BANDAGES/DRESSINGS) ×6 IMPLANT
SUT VIC AB 0 CT1 27 (SUTURE) ×8
SUT VIC AB 0 CT1 27XCR 8 STRN (SUTURE) ×6 IMPLANT
SUT VIC AB 0 CT1 36 (SUTURE) ×4 IMPLANT
SUT VIC AB 0 CT2 27 (SUTURE) ×4 IMPLANT
SUT VIC AB 4-0 FS2 27 (SUTURE) ×2 IMPLANT
SUT VIC AB 4-0 SH 27 (SUTURE) ×4
SUT VIC AB 4-0 SH 27XANBCTRL (SUTURE) ×3 IMPLANT
SYR CONTROL 10ML (SYRINGE) ×4 IMPLANT
SYRINGE 10CC LL (SYRINGE) ×4 IMPLANT
TROCAR ENDO BLADELESS 11MM (ENDOMECHANICALS) ×4 IMPLANT
TROCAR XCEL NON-BLD 5MMX100MML (ENDOMECHANICALS) ×4 IMPLANT
TUBING INSUFFLATOR HEATED (MISCELLANEOUS) ×4 IMPLANT

## 2016-08-20 NOTE — Anesthesia Preprocedure Evaluation (Signed)
Anesthesia Evaluation  Patient identified by MRN, date of birth, ID band Patient awake    Reviewed: Allergy & Precautions, H&P , NPO status , Patient's Chart, lab work & pertinent test results, reviewed documented beta blocker date and time   History of Anesthesia Complications (+) history of anesthetic complications  Airway Mallampati: III  TM Distance: >3 FB Neck ROM: full    Dental  (+) Teeth Intact   Pulmonary neg pulmonary ROS, asthma , former smoker,    Pulmonary exam normal        Cardiovascular negative cardio ROS Normal cardiovascular exam Rhythm:regular Rate:Normal  Hx of VSD with clearance of cardiologist.     Neuro/Psych  Headaches, PSYCHIATRIC DISORDERS negative neurological ROS  negative psych ROS   GI/Hepatic negative GI ROS, Neg liver ROS,   Endo/Other  negative endocrine ROSMorbid obesity  Renal/GU negative Renal ROS  negative genitourinary   Musculoskeletal   Abdominal   Peds  Hematology negative hematology ROS (+)   Anesthesia Other Findings Past Medical History: 08/05/2013: Abnormal weight gain 08/05/2013: Absence of interventricular septum 04/17/2016: Allergic rhinitis 02/09/2016: Allergic urticaria due to ingested food No date: Asthma 09/30/2013: Clinical depression No date: Complication of anesthesia 09/30/2013: Excess weight 09/30/2013: Headache, migraine 04/11/2014: Heavy drinker     Comment: Overview:  Patient reports drinking 2-3               bottles of wine each weekend.   09/30/2013: Infection of urinary tract 09/30/2013: Irregular bleeding Past Surgical History: 11/13/13: BREAST BIOPSY Left     Comment: benign No date: CESAREAN SECTION No date: COLONOSCOPY No date: CORONARY ANGIOPLASTY No date: CYSTOSCOPY WITH HYDRODISTENSION AND BIOPSY No date: NASAL SINUS SURGERY     Comment: x12   Reproductive/Obstetrics negative OB ROS                              Anesthesia Physical Anesthesia Plan  ASA: III  Anesthesia Plan: General ETT   Post-op Pain Management:    Induction:   Airway Management Planned:   Additional Equipment:   Intra-op Plan:   Post-operative Plan:   Informed Consent: I have reviewed the patients History and Physical, chart, labs and discussed the procedure including the risks, benefits and alternatives for the proposed anesthesia with the patient or authorized representative who has indicated his/her understanding and acceptance.   Dental Advisory Given  Plan Discussed with: CRNA  Anesthesia Plan Comments:         Anesthesia Quick Evaluation

## 2016-08-20 NOTE — Brief Op Note (Signed)
08/20/2016  10:07 AM  PATIENT:  Kristine [name]  45 y.o. female  PRE-OPERATIVE DIAGNOSIS:  menorrhagia  POST-OPERATIVE DIAGNOSIS:  menorrhagia  PROCEDURE:  Procedure(s): LAPAROSCOPIC ASSISTED VAGINAL HYSTERECTOMY (N/A) LAPAROSCOPIC BILATERAL SALPINGECTOMY (Bilateral)  SURGEON:  Surgeon(s) and Role:    Boykin Nearing, MD - Primary    PHYSICIAN ASSISTANT: Larey Days , MD   ASSISTANTS:scrub tech  ANESTHESIA:   general  EBL:  100 cc  IOF 1250 cc BLOOD ADMINISTERED:none  DRAINS: Urinary Catheter (Foley)   LOCAL MEDICATIONS USED:  MARCAINE     SPECIMEN:  Source of Specimen:  bilateral tubes and cervix and uterus   DISPOSITION OF SPECIMEN:  PATHOLOGY  COUNTS:  YES  TOURNIQUET:  * No tourniquets in log *  DICTATION: .Other Dictation: Dictation Number verbal  PLAN OF CARE: Admit for overnight observation  PATIENT DISPOSITION:  PACU - hemodynamically stable.   Delay start of Pharmacological VTE agent (>24hrs) due to surgical blood loss or risk of bleeding: not applicable

## 2016-08-20 NOTE — Transfer of Care (Signed)
Immediate Anesthesia Transfer of Care Note  Patient: Kristine [name]  Procedure(s) Performed: Procedure(s): LAPAROSCOPIC ASSISTED VAGINAL HYSTERECTOMY (N/A) LAPAROSCOPIC BILATERAL SALPINGECTOMY (Bilateral)  Patient Location: PACU  Anesthesia Type:General  Level of Consciousness: awake, alert  and oriented  Airway & Oxygen Therapy: Patient Spontanous Breathing and Patient connected to face mask oxygen  Post-op Assessment: Report given to RN and Post -op Vital signs reviewed and stable  Post vital signs: Reviewed and stable  Last Vitals:  Vitals:   08/20/16 0630 08/20/16 1019  BP: 126/76 133/71  Pulse: 72 88  Resp: 16 16  Temp: 36.6 C 37 C    Last Pain:  Vitals:   08/20/16 1019  TempSrc:   PainSc: (P) 8          Complications: No apparent anesthesia complications

## 2016-08-20 NOTE — Progress Notes (Signed)
Patient ID: Kristine [name], female   DOB: 1971-07-19, 45 y.o.   MRN: [id number] DOS , central back pain  Demerol IV for pain relief .  'VSS Urineoutput: . 50 cc / hr  Vag pad  Min blood  A; stable  P: check labs in am  Cont foley

## 2016-08-20 NOTE — Progress Notes (Signed)
Ready for LAVH , bilateral salpingectomy . NPO . All questions answered . Neg HCG . Cardiologist saw last week and cleared for surgery . No additional prophylactic abx.

## 2016-08-20 NOTE — Anesthesia Procedure Notes (Signed)
Procedure Name: Intubation Date/Time: 08/20/2016 7:45 AM Performed by: Kennon Holter Pre-anesthesia Checklist: Timeout performed, Patient being monitored, Suction available, Emergency Drugs available and Patient identified Patient Re-evaluated:Patient Re-evaluated prior to inductionOxygen Delivery Method: Circle system utilized Preoxygenation: Pre-oxygenation with 100% oxygen Intubation Type: IV induction Ventilation: Mask ventilation without difficulty Laryngoscope Size: Miller and 3 Grade View: Grade II Tube type: Oral Tube size: 7.0 mm Airway Equipment and Method: Stylet Placement Confirmation: positive ETCO2 and breath sounds checked- equal and bilateral Secured at: 22 cm Tube secured with: Tape Dental Injury: Teeth and Oropharynx as per pre-operative assessment  Comments: We removed head pillow.  Will suggest placing pillow under shoulders or Glidescope in the future.

## 2016-08-21 DIAGNOSIS — N920 Excessive and frequent menstruation with regular cycle: Secondary | ICD-10-CM | POA: Diagnosis not present

## 2016-08-21 LAB — CBC
HCT: 34.6 % — ABNORMAL LOW (ref 35.0–47.0)
Hemoglobin: 11.7 g/dL — ABNORMAL LOW (ref 12.0–16.0)
MCH: 29.2 pg (ref 26.0–34.0)
MCHC: 33.9 g/dL (ref 32.0–36.0)
MCV: 85.9 fL (ref 80.0–100.0)
PLATELETS: 214 10*3/uL (ref 150–440)
RBC: 4.03 MIL/uL (ref 3.80–5.20)
RDW: 14.8 % — AB (ref 11.5–14.5)
WBC: 10.1 10*3/uL (ref 3.6–11.0)

## 2016-08-21 LAB — COMPREHENSIVE METABOLIC PANEL
ALBUMIN: 3.1 g/dL — AB (ref 3.5–5.0)
ALT: 25 U/L (ref 14–54)
ANION GAP: 7 (ref 5–15)
AST: 25 U/L (ref 15–41)
Alkaline Phosphatase: 29 U/L — ABNORMAL LOW (ref 38–126)
BUN: 13 mg/dL (ref 6–20)
CHLORIDE: 103 mmol/L (ref 101–111)
CO2: 27 mmol/L (ref 22–32)
Calcium: 8.2 mg/dL — ABNORMAL LOW (ref 8.9–10.3)
Creatinine, Ser: 0.75 mg/dL (ref 0.44–1.00)
GFR calc Af Amer: >60 mL/min (ref >60)
GLUCOSE: 138 mg/dL — AB (ref 65–99)
POTASSIUM: 3.8 mmol/L (ref 3.5–5.1)
Sodium: 137 mmol/L (ref 135–145)
TOTAL PROTEIN: 5.1 g/dL — AB (ref 6.5–8.1)
Total Bilirubin: 0.2 mg/dL — ABNORMAL LOW (ref 0.3–1.2)

## 2016-08-21 MED ORDER — ONDANSETRON HCL 4 MG PO TABS: 4.0000 mg | ORAL_TABLET | Freq: Four times a day (QID) | ORAL | 0 refills | Status: DC | PRN

## 2016-08-21 MED ORDER — DOCUSATE SODIUM 100 MG PO CAPS: 100.0000 mg | ORAL_CAPSULE | Freq: Two times a day (BID) | ORAL | 0 refills | Status: DC

## 2016-08-21 MED ORDER — SIMETHICONE 80 MG PO CHEW: 80.0000 mg | CHEWABLE_TABLET | Freq: Four times a day (QID) | ORAL | 0 refills | Status: DC | PRN

## 2016-08-21 MED ORDER — MEPERIDINE HCL 50 MG PO TABS: 100.0000 mg | ORAL_TABLET | Freq: Four times a day (QID) | ORAL | 0 refills | Status: DC | PRN

## 2016-08-21 MED ORDER — IBUPROFEN 200 MG PO TABS: 800.0000 mg | ORAL_TABLET | Freq: Three times a day (TID) | ORAL | 0 refills | Status: DC | PRN

## 2016-08-21 MED ORDER — MEPERIDINE HCL 25 MG/ML IJ SOLN: 50.0000 mg | INTRAMUSCULAR | 0 refills | Status: DC | PRN

## 2016-08-21 NOTE — Op Note (Signed)
Kristine [name], Kristine [name]              ACCOUNT NO.:  [id number]  MEDICAL RECORD NO.:  [id number]  LOCATION:  348A                         FACILITY:  [facility]  PHYSICIAN:  Laverta Baltimore, MDDATE OF BIRTH:  [birth year]  DATE OF PROCEDURE: DATE OF DISCHARGE:                              OPERATIVE REPORT   PREOPERATIVE DIAGNOSIS: 1. Menorrhagia. 2. Cervical dysplasia.  POSTOPERATIVE DIAGNOSIS: 1. Menorrhagia. 2. Cervical dysplasia. 3. Abdominal adhesions.  PROCEDURE: 1. Laparoscopic-assisted vaginal hysterectomy. 2. Bilateral salpingectomy.  ANESTHESIA:  General endotracheal anesthesia.  SURGEON:  Laverta Baltimore, MD  FIRST ASSISTANT:  Larey Days, MD.  INDICATION:  A 45 year old, gravida 4, para 2 patient with a long history of menorrhagia.  The patient is elected for definitive surgery. The patient was noted to have LGSIL on a recent Pap smear.  Endometrial biopsy performed within the last 2 months within normal limits.  DESCRIPTION OF PROCEDURE:  After adequate general endotracheal anesthesia, patient was placed in dorsal supine position with the legs in the [place] stirrups.  The patient's abdomen was prepped and draped in normal sterile fashion.  Vaginal prep was performed as well.  The patient received gentamicin and clindamycin antibiotics for prophylaxis prior to commencement of the case.  Time-out was performed.  A speculum was placed in the vagina and the anterior cervix was grasped with single- tooth tenaculum and uterine manipulator was placed into the endometrial cavity and the 2 were anchored together with Steri-Strips placed.  A Foley catheter was placed yielding 150 mL clear urine.  Gloves were changed.  A 15 mm incision was made in the infraumbilical region.  The laparoscope was advanced into the abdominal cavity under direct visualization with the Optiview cannula.  Several omental adhesions were encountered initially, the patient's abdomen was  insufflated.  Second port was placed 3 cm medial to the left anterior iliac spine and under direct visualization the trocar was advanced into the abdominal cavity. A 3rd trocar was placed on the right side, again 3 cm medial to the right anterior iliac spine.  Direct visualization was noted.  Attention was directed to the patient's adhesions which were taken down with Harmonic scalpel.  The left fallopian tube was grasped at the fimbriated end and with the Harmonic Scalpel the left fallopian tube was removed. The left round ligament was then clamped and cauterized and opened with the Harmonic scalpel and the utero-ovarian ligament on the left was then dissected.  The broad ligament was dissected.  The bladder flap was created and incised with the Harmonic scalpel and the left uterine artery was cauterized with the Kleppingers and then transected with Harmonic scalpel.  Similar procedure was repeated on the patient's right side.  Again, the right fallopian tube was removed with Harmonic scalpel and the uterine ovarian ligament was clamped and transected and sequential incisions were made through the broad ligament and the right uterine artery was cauterized and then transected with the Harmonic scalpel.  The procedure was then directed vaginally and the Foley catheter was removed and the legs were brought up.  Two thyroid tenacula were placed on the cervix and a weighted speculum was placed into the vagina.  A direct posterior colpotomy incision was made  upon entry into the posterior cul-de-sac.  The uterosacral ligaments were bilaterally clamped, transected, suture ligated with 0 Vicryl suture and tagged for later identification.  Circumferential incision was made with the Bovie anteriorly.  Of note, the cervix was previously injected with 1% lidocaine with epinephrine.  Cardinal ligaments were then bilaterally clamped, transected, suture ligated with 0 Vicryl suture.  The  anterior cul-de-sac was entered sharply without difficulty and 2 additional clamps were used to communicate the prior intra-abdominal incision to the vaginal incisions in the broad ligament.  The uterus was delivered. The cuff appeared hemostatic.  The vaginal cuff was then closed with 0 Vicryl suture in a running nonlocking fashion.  The uterosacral ligaments were plicated centrally and the rest of vaginal cuff was closed with the 0-Vicryl suture.  Good hemostasis noted.  Foley catheter was replaced yielding clear urine.  Gloves were changed and again the patient's abdomen was insufflated and attention was directed side of the abdomen.  There was a small amount of bleeding to the left lateral aspect of the vaginal cuff which was controlled with the Kleppinger. The patient's abdomen was irrigated and good hemostasis was noted.  The pressure was lowered to 7 mmHg.  The patient's abdomen was then deflated, and the infraumbilical incision was closed with a fascial layer of 2-0 Vicryl suture and all skin incisions were closed with interrupted 4-0 Vicryl suture.  Dermabond was placed over the incisions. There were no complications.  ESTIMATED BLOOD LOSS:  100 mL general.  INTRAOPERATIVE FLUIDS:  1200 mL.  The patient tolerated the procedure well, was taken to the recovery room in good condition.          ______________________________ Laverta Baltimore, MD     TS/MEDQ  D:  08/20/2016  T:  08/21/2016  Job:  [id number]

## 2016-08-21 NOTE — Discharge Summary (Signed)
Physician Discharge Summary  Patient ID: Kristine [name] MRN: [id number] DOB/AGE: 1971/07/22 45 y.o.  Admit date: 08/20/2016 Discharge date: 08/21/2016  Admission Diagnoses:menorrhagia  Discharge Diagnoses: same  Active Problems:   Postoperative state   Discharged Condition: good  Hospital Course: pt underwent a LAVH and bilateral salpingectomy  Post op did well with demerol and  toradol . Good urine output . hct 34% and bun / cr; 13/0.75  Consults: None  Significant Diagnostic Studies: labs: as above  Treatments: LAVH / bilat salpingectomy   Discharge Exam: Blood pressure (!) 98/43, pulse 77, temperature 98.6 F (37 C), temperature source Oral, resp. rate 14, last menstrual period 07/17/2016, SpO2 98 %. General appearance: alert and cooperative Resp: clear to auscultation bilaterally Cardio: regular rate and rhythm, S1, S2 normal, no murmur, click, rub or gallop GI: soft, non-tender; bowel sounds normal; no masses,  no organomegaly  Small spotting on pad   Disposition: 01-Home or Self Care  Discharge Instructions    Call MD for:  difficulty breathing, headache or visual disturbances    Complete by:  As directed    Call MD for:  extreme fatigue    Complete by:  As directed    Call MD for:  hives    Complete by:  As directed    Call MD for:  persistant dizziness or light-headedness    Complete by:  As directed    Call MD for:  persistant nausea and vomiting    Complete by:  As directed    Call MD for:  redness, tenderness, or signs of infection (pain, swelling, redness, odor or green/yellow discharge around incision site)    Complete by:  As directed    Call MD for:  severe uncontrolled pain    Complete by:  As directed    Call MD for:  temperature >100.4    Complete by:  As directed    Diet - low sodium heart healthy    Complete by:  As directed    Increase activity slowly    Complete by:  As directed        Medication List    STOP taking these  medications   metroNIDAZOLE 500 MG tablet Commonly known as:  FLAGYL   nitrofurantoin 100 MG capsule Commonly known as:  MACRODANTIN   POTASSIUM CHLORIDE PO   sulfamethoxazole-trimethoprim 800-160 MG tablet Commonly known as:  BACTRIM DS,SEPTRA DS   vitamin C 1000 MG tablet     TAKE these medications   azelastine 0.05 % ophthalmic solution Commonly known as:  OPTIVAR Place 1 drop into both eyes 2 (two) times daily.   Azelastine HCl 0.15 % Soln Place 2 sprays into the nose 2 (two) times daily.   Biotin 10000 MCG Tabs Take 10,000 mg by mouth daily.   Calcium Carb-Ergocalciferol 500-200 MG-UNIT Tabs Take 1 tablet by mouth 2 (two) times daily.   cetirizine 10 MG tablet Commonly known as:  ZYRTEC Take 1 tablet by mouth daily.   CRANBERRY PO Take 1 capsule by mouth daily.   docusate sodium 100 MG capsule Commonly known as:  COLACE Take 1 capsule (100 mg total) by mouth 2 (two) times daily.   EPINEPHrine 0.3 mg/0.3 mL Soaj injection Commonly known as:  EPI-PEN Inject 0.3 mLs (0.3 mg total) into the muscle once.   FIBER SELECT GUMMIES PO Take 1 tablet by mouth daily.   fluticasone 220 MCG/ACT inhaler Commonly known as:  FLOVENT HFA Inhale 2 puffs into the lungs 2 (two)  times daily.   fluticasone 50 MCG/ACT nasal spray Commonly known as:  FLONASE Place 1 spray into both nostrils daily.   hydrochlorothiazide 12.5 MG tablet Commonly known as:  HYDRODIURIL Take 12.5 mg by mouth daily.   hydroxychloroquine 200 MG tablet Commonly known as:  PLAQUENIL Take 200 mg by mouth 2 (two) times daily.   hydrOXYzine 25 MG tablet Commonly known as:  ATARAX/VISTARIL Take 25 mg by mouth daily.   ibuprofen 200 MG tablet Commonly known as:  MOTRIN IB Take 4 tablets (800 mg total) by mouth every 8 (eight) hours as needed for moderate pain.   MAGNESIUM CHLORIDE PO Take 1 capsule by mouth daily.   meperidine 25 MG/ML injection Commonly known as:  DEMEROL Inject 2 mLs (50 mg  total) into the vein every 2 (two) hours as needed.   meperidine 50 MG tablet Commonly known as:  DEMEROL Take 2 tablets (100 mg total) by mouth every 6 (six) hours as needed for severe pain.   mirabegron ER 50 MG Tb24 tablet Commonly known as:  MYRBETRIQ Take 1 tablet (50 mg total) by mouth daily.   MULTI-VITAMIN GUMMIES PO Take 1 tablet by mouth daily.   ondansetron 4 MG tablet Commonly known as:  ZOFRAN Take 1 tablet (4 mg total) by mouth every 6 (six) hours as needed for nausea.   predniSONE 10 MG tablet Commonly known as:  DELTASONE Take 10 mg by mouth 2 (two) times daily with a meal.   ranitidine 150 MG tablet Commonly known as:  ZANTAC Take 150 mg by mouth daily.   simethicone 80 MG chewable tablet Commonly known as:  GAS-X Chew 1 tablet (80 mg total) by mouth 4 (four) times daily as needed for flatulence.   SINGULAIR 10 MG tablet Generic drug:  montelukast Take 10 mg by mouth at bedtime.   TONALIN CLA PO Take 1 capsule by mouth daily.   VENTOLIN HFA 108 (90 Base) MCG/ACT inhaler Generic drug:  albuterol Inhale 2 puffs into the lungs daily as needed.   VITAMIN D-1000 MAX ST 1000 units tablet Generic drug:  Cholecalciferol Take 1 tablet by mouth daily.      Follow-up Information    SCHERMERHORN,THOMAS, MD Follow up in 2 week(s).   Specialty:  Obstetrics and Gynecology Why:  post op Contact information: [address] [place] [address] [phone]           Signed: Laverta Baltimore 08/21/2016, 8:45 AM

## 2016-08-21 NOTE — Anesthesia Postprocedure Evaluation (Signed)
Anesthesia Post Note  Patient: Kristine [name]  Procedure(s) Performed: Procedure(s) (LRB): LAPAROSCOPIC ASSISTED VAGINAL HYSTERECTOMY (N/A) LAPAROSCOPIC BILATERAL SALPINGECTOMY (Bilateral)  Patient location during evaluation: PACU Anesthesia Type: MAC Level of consciousness: awake and alert Pain management: pain level controlled Vital Signs Assessment: post-procedure vital signs reviewed and stable Respiratory status: spontaneous breathing, nonlabored ventilation, respiratory function stable and patient connected to nasal cannula oxygen Cardiovascular status: stable and blood pressure returned to baseline Postop Assessment: no signs of nausea or vomiting Anesthetic complications: no    Last Vitals:  Vitals:   08/21/16 0440 08/21/16 0707  BP: (!) 112/47 (!) 98/43  Pulse: 77 77  Resp:  14  Temp:  37 C    Last Pain:  Vitals:   08/21/16 0707  TempSrc: Oral  PainSc:                  Molli Barrows

## 2016-08-21 NOTE — Progress Notes (Signed)
Patient understands all discharge instructions and the need to attend follow up appointments. Patient discharge via wheelchair with nurse tech.

## 2016-08-22 LAB — SURGICAL PATHOLOGY

## 2016-09-03 DIAGNOSIS — L501 Idiopathic urticaria: Secondary | ICD-10-CM | POA: Insufficient documentation

## 2016-09-03 NOTE — Progress Notes (Signed)
[name]  [phone] [phone]  ID: Kristine [name] OB: [birth year]  MR#: [id number]  [id number]  Patient Care Team: Marinda Elk, MD as PCP - General (Physician Assistant)  CHIEF COMPLAINT:  Idiopathic urticaria  INTERVAL HISTORY: Patient is a 45 year old female who over the past several months has developed chronic idiopathic urticaria. Full workup by dermatology and immunology have not revealed an etiology. She otherwise feels well and is asymptomatic. She denies any fevers, night sweats, or weight loss. She has no neurologic complaints. She denies any chest pain or shortness of breath. She has no nausea, vomiting, constipation, or diarrhea. She has no urinary complaints. Patient otherwise feels well and offers no further specific complaints.  REVIEW OF SYSTEMS:   Review of Systems  Constitutional: Negative.  Negative for fever, malaise/fatigue and weight loss.  Respiratory: Negative.  Negative for cough and shortness of breath.   Cardiovascular: Negative.  Negative for chest pain and leg swelling.  Gastrointestinal: Negative.  Negative for abdominal pain, blood in stool and melena.  Genitourinary: Negative.   Musculoskeletal: Negative.   Skin: Positive for itching and rash.  Neurological: Negative.  Negative for sensory change and weakness.  Psychiatric/Behavioral: The patient is nervous/anxious.    As per HPI. Otherwise, a complete review of systems is negative.   PAST MEDICAL HISTORY: Past Medical History:  Diagnosis Date  . Abnormal weight gain 08/05/2013  . Absence of interventricular septum 08/05/2013  . Allergic rhinitis 04/17/2016  . Allergic urticaria due to ingested food 02/09/2016  . Anxiety   . Asthma   . Clinical depression 09/30/2013  . Complication of anesthesia   . Depression   . Dizziness   . Excess weight 09/30/2013  . GERD (gastroesophageal reflux disease)   . Headache, migraine 09/30/2013  . Heart  murmur   . Heavy drinker 04/11/2014   Overview:  Patient reports drinking 2-3 bottles of wine each weekend.    . Infection of urinary tract 09/30/2013  . Irregular bleeding 09/30/2013    PAST SURGICAL HISTORY: Past Surgical History:  Procedure Laterality Date  . BREAST BIOPSY Left 11/13/13   benign  . CESAREAN SECTION    . COLONOSCOPY    . CORONARY ANGIOPLASTY    . CYSTOSCOPY WITH HYDRODISTENSION AND BIOPSY    . LAPAROSCOPIC ASSISTED VAGINAL HYSTERECTOMY N/A 08/20/2016   Procedure: LAPAROSCOPIC ASSISTED VAGINAL HYSTERECTOMY;  Surgeon: Boykin Nearing, MD;  Location: [facility] ORS;  Service: Gynecology;  Laterality: N/A;  . LAPAROSCOPIC BILATERAL SALPINGECTOMY Bilateral 08/20/2016   Procedure: LAPAROSCOPIC BILATERAL SALPINGECTOMY;  Surgeon: Boykin Nearing, MD;  Location: [facility] ORS;  Service: Gynecology;  Laterality: Bilateral;  . NASAL SINUS SURGERY     x12    FAMILY HISTORY: Family History  Problem Relation Age of Onset  . Breast cancer Maternal Aunt   . Breast cancer Paternal Aunt   . Breast cancer Maternal Grandmother   . Breast cancer Paternal Grandmother   . Breast cancer Paternal Aunt   . Kidney cancer Maternal Uncle   . Prostate cancer Paternal Grandfather   . Kidney disease      father's side  . Aneurysm Mother   . Liver disease Mother   . Cancer Father     ADVANCED DIRECTIVES (Y/N):  N  HEALTH MAINTENANCE: Social History  Substance Use Topics  . Smoking status: Former Research scientist (life sciences)  . Smokeless tobacco: Never Used     Comment: quit 2008  . Alcohol use No     Colonoscopy:  PAP:  Bone density:  Lipid panel:  Allergies  Allergen Reactions  . Bee Pollen Anaphylaxis  . Bee Venom Anaphylaxis  . Keflex [Cephalexin] Anaphylaxis  . Codeine     Other reaction(s): RASH  . Eggs Or Egg-Derived Products   . Ferrous Gluconate Diarrhea    And vomiting  . Hydrocodone Hives  . Iron Diarrhea  . Lactase Other (See Comments)  . Milk-Related Compounds   .  Penicillins   . Shellfish Allergy Other (See Comments)  . Soy Allergy   . Wheat Bran   . Iodine Rash  . Latex Itching and Rash    Current Outpatient Prescriptions  Medication Sig Dispense Refill  . albuterol (VENTOLIN HFA) 108 (90 Base) MCG/ACT inhaler Inhale 2 puffs into the lungs every 4 (four) hours as needed.     . Azelastine HCl 0.15 % SOLN Place 2 sprays into the nose 2 (two) times daily.     . B Complex Vitamins (VITAMIN B COMPLEX PO) Take 1 tablet by mouth daily.    . Biotin 10000 MCG TABS Take 10,000 mg by mouth daily.    . cetirizine (ZYRTEC) 10 MG tablet Take 2 tablets by mouth 2 (two) times daily.     . Cholecalciferol (VITAMIN D-1000 MAX ST) 1000 units tablet Take 1 tablet by mouth daily.    Marland Kitchen CRANBERRY PO Take 1 capsule by mouth daily.    Marland Kitchen darifenacin (ENABLEX) 15 MG 24 hr tablet Take 15 mg by mouth daily.    Marland Kitchen EPINEPHrine 0.3 mg/0.3 mL IJ SOAJ injection Inject 0.3 mLs (0.3 mg total) into the muscle once. 1 Device 1  . fluticasone (FLONASE) 50 MCG/ACT nasal spray Place 1 spray into both nostrils daily.    . fluticasone (FLOVENT HFA) 220 MCG/ACT inhaler Inhale 1 puff into the lungs 2 (two) times daily.     . Fluticasone-Salmeterol (ADVAIR) 250-50 MCG/DOSE AEPB Inhale 1 puff into the lungs 2 (two) times daily.    . hydrochlorothiazide (HYDRODIURIL) 12.5 MG tablet Take 12.5 mg by mouth daily.    . hydroxychloroquine (PLAQUENIL) 200 MG tablet Take 400 mg by mouth daily.     . hydrOXYzine (ATARAX/VISTARIL) 25 MG tablet Take 25 mg by mouth 3 (three) times daily.     . metroNIDAZOLE (FLAGYL) 500 MG tablet Take 500 mg by mouth 2 (two) times daily.    . montelukast (SINGULAIR) 10 MG tablet Take 10 mg by mouth 2 (two) times daily.     . Multiple Vitamins-Minerals (MULTI-VITAMIN GUMMIES PO) Take 1 tablet by mouth daily.    . nitrofurantoin, macrocrystal-monohydrate, (MACROBID) 100 MG capsule Take 100 mg by mouth daily.    . predniSONE (DELTASONE) 10 MG tablet Take 17 mg by mouth  daily with breakfast.     . ranitidine (ZANTAC) 150 MG tablet Take 150 mg by mouth 2 (two) times daily.      No current facility-administered medications for this visit.     OBJECTIVE: Vitals:   09/04/16 1509  BP: (!) 144/87  Pulse: 76  Resp: 18  Temp: 98.3 F (36.8 C)     Body mass index is 31.25 kg/m.    ECOG FS:0 - Asymptomatic  General: Well-developed, well-nourished, no acute distress. Eyes: Pink conjunctiva, anicteric sclera. HEENT: Normocephalic, moist mucous membranes, clear oropharnyx. Lungs: Clear to auscultation bilaterally. Heart: Regular rate and rhythm. No rubs, murmurs, or gallops. Abdomen: Soft, nontender, nondistended. No organomegaly noted, normoactive bowel sounds. Musculoskeletal: No edema, cyanosis, or clubbing. Neuro: Alert, answering all questions appropriately. Cranial  nerves grossly intact. Skin: No rashes or petechiae noted. Psych: Normal affect. Lymphatics: No cervical, calvicular, axillary or inguinal LAD.   LAB RESULTS:  Lab Results  Component Value Date   NA 137 09/04/2016   K 3.5 09/04/2016   CL 97 (L) 09/04/2016   CO2 29 09/04/2016   GLUCOSE 112 (H) 09/04/2016   BUN 15 09/04/2016   CREATININE 0.75 09/04/2016   CALCIUM 9.3 09/04/2016   PROT 7.4 09/04/2016   ALBUMIN 4.1 09/04/2016   AST 27 09/04/2016   ALT 32 09/04/2016   ALKPHOS 44 09/04/2016   BILITOT 0.3 09/04/2016   GFRNONAA >60 09/04/2016   GFRAA >60 09/04/2016    Lab Results  Component Value Date   WBC 13.3 (H) 09/04/2016   NEUTROABS 10.3 (H) 09/04/2016   HGB 14.2 09/04/2016   HCT 43.0 09/04/2016   MCV 85.5 09/04/2016   PLT 381 09/04/2016     STUDIES: No results found.  ASSESSMENT:  Idiopathic urticaria  PLAN:   1.  Idiopathic urticaria: Unclear etiology. Patient has a significantly elevated IgE level, but otherwise the remainder of her laboratory work including sedimentation rate, comprehensive metabolic panel, thyroid panel, ANA, peripheral blood flow  cytometry, CBC, SPEP with immunoglobulins, and tryptase levels are all within normal limits. Her eosinophils are also within normal limits. Unclear etiology, but highly unlikely to be related to underlying malignancy. Patient reports she has a CT scan of the chest at [facility] in the next week. Return to clinic in 2 weeks for discussion of her laboratory work and further evaluation. 2. Leukocytosis: Mild and likely reactive. Given the neutrophil predominance, can consider BCR-ABL the future. 3. Elevated IgE levels: Likely secondary to immune response of unclear etiology.  Approximately 45 minutes was spent in discussion of which greater than 50% was consultation.   Patient expressed understanding and was in agreement with this plan. She also understands that She can call clinic at any time with any questions, concerns, or complaints.   No matching staging information was found for the patient.  Lloyd Huger, MD   09/09/2016 9:38 AM

## 2016-09-04 ENCOUNTER — Inpatient Hospital Stay: Payer: 59 | Attending: Oncology | Admitting: Oncology

## 2016-09-04 ENCOUNTER — Inpatient Hospital Stay: Payer: 59

## 2016-09-04 ENCOUNTER — Encounter: Payer: Self-pay | Admitting: Oncology

## 2016-09-04 VITALS — BP 144/87 | HR 76 | Temp 98.3°F | Resp 18 | Wt 170.9 lb

## 2016-09-04 DIAGNOSIS — Z8042 Family history of malignant neoplasm of prostate: Secondary | ICD-10-CM | POA: Diagnosis not present

## 2016-09-04 DIAGNOSIS — K219 Gastro-esophageal reflux disease without esophagitis: Secondary | ICD-10-CM | POA: Diagnosis not present

## 2016-09-04 DIAGNOSIS — F419 Anxiety disorder, unspecified: Secondary | ICD-10-CM | POA: Insufficient documentation

## 2016-09-04 DIAGNOSIS — R7989 Other specified abnormal findings of blood chemistry: Secondary | ICD-10-CM | POA: Insufficient documentation

## 2016-09-04 DIAGNOSIS — Z803 Family history of malignant neoplasm of breast: Secondary | ICD-10-CM | POA: Diagnosis not present

## 2016-09-04 DIAGNOSIS — L501 Idiopathic urticaria: Secondary | ICD-10-CM

## 2016-09-04 DIAGNOSIS — Z8051 Family history of malignant neoplasm of kidney: Secondary | ICD-10-CM | POA: Diagnosis not present

## 2016-09-04 DIAGNOSIS — Z87891 Personal history of nicotine dependence: Secondary | ICD-10-CM | POA: Insufficient documentation

## 2016-09-04 DIAGNOSIS — Z79899 Other long term (current) drug therapy: Secondary | ICD-10-CM | POA: Insufficient documentation

## 2016-09-04 DIAGNOSIS — D72829 Elevated white blood cell count, unspecified: Secondary | ICD-10-CM | POA: Diagnosis not present

## 2016-09-04 LAB — COMPREHENSIVE METABOLIC PANEL
ALBUMIN: 4.1 g/dL (ref 3.5–5.0)
ALT: 32 U/L (ref 14–54)
AST: 27 U/L (ref 15–41)
Alkaline Phosphatase: 44 U/L (ref 38–126)
Anion gap: 11 (ref 5–15)
BUN: 15 mg/dL (ref 6–20)
CHLORIDE: 97 mmol/L — AB (ref 101–111)
CO2: 29 mmol/L (ref 22–32)
CREATININE: 0.75 mg/dL (ref 0.44–1.00)
Calcium: 9.3 mg/dL (ref 8.9–10.3)
GFR calc Af Amer: >60 mL/min (ref >60)
GLUCOSE: 112 mg/dL — AB (ref 65–99)
Potassium: 3.5 mmol/L (ref 3.5–5.1)
Sodium: 137 mmol/L (ref 135–145)
Total Bilirubin: 0.3 mg/dL (ref 0.3–1.2)
Total Protein: 7.4 g/dL (ref 6.5–8.1)

## 2016-09-04 LAB — CBC WITH DIFFERENTIAL/PLATELET
BASOS ABS: 0.1 10*3/uL (ref 0–0.1)
Basophils Relative: 1 %
EOS PCT: 1 %
Eosinophils Absolute: 0.1 10*3/uL (ref 0–0.7)
HEMATOCRIT: 43 % (ref 35.0–47.0)
HEMOGLOBIN: 14.2 g/dL (ref 12.0–16.0)
LYMPHS ABS: 2.1 10*3/uL (ref 1.0–3.6)
LYMPHS PCT: 16 %
MCH: 28.3 pg (ref 26.0–34.0)
MCHC: 33.1 g/dL (ref 32.0–36.0)
MCV: 85.5 fL (ref 80.0–100.0)
Monocytes Absolute: 0.6 10*3/uL (ref 0.2–0.9)
Monocytes Relative: 5 %
NEUTROS ABS: 10.3 10*3/uL — AB (ref 1.4–6.5)
NEUTROS PCT: 77 %
Platelets: 381 10*3/uL (ref 150–440)
RBC: 5.03 MIL/uL (ref 3.80–5.20)
RDW: 14.6 % — ABNORMAL HIGH (ref 11.5–14.5)
WBC: 13.3 10*3/uL — AB (ref 3.6–11.0)

## 2016-09-04 LAB — SEDIMENTATION RATE: Sed Rate: 11 mm/hr (ref 0–20)

## 2016-09-04 NOTE — Progress Notes (Signed)
New evaluation for urticaria. Pt states has had symptoms since last year that have progressively been getting worse. Pt has developed severe allergies and PCP that the pt has some form of cancer.

## 2016-09-05 LAB — PROTEIN ELECTROPHORESIS, SERUM
A/G Ratio: 1.3 (ref 0.7–1.7)
ALBUMIN ELP: 3.8 g/dL (ref 2.9–4.4)
Alpha-1-Globulin: 0.2 g/dL (ref 0.0–0.4)
Alpha-2-Globulin: 0.7 g/dL (ref 0.4–1.0)
BETA GLOBULIN: 1.3 g/dL (ref 0.7–1.3)
GAMMA GLOBULIN: 0.7 g/dL (ref 0.4–1.8)
Globulin, Total: 3 g/dL (ref 2.2–3.9)
TOTAL PROTEIN ELP: 6.8 g/dL (ref 6.0–8.5)

## 2016-09-05 LAB — TRYPTASE: Tryptase: 3.1 ug/L (ref 2.2–13.2)

## 2016-09-05 LAB — ANA W/REFLEX: Anti Nuclear Antibody(ANA): NEGATIVE

## 2016-09-05 LAB — KAPPA/LAMBDA LIGHT CHAINS
KAPPA FREE LGHT CHN: 11.7 mg/L (ref 3.3–19.4)
KAPPA, LAMDA LIGHT CHAIN RATIO: 0.96 (ref 0.26–1.65)
LAMDA FREE LIGHT CHAINS: 12.2 mg/L (ref 5.7–26.3)

## 2016-09-05 LAB — THYROID PANEL WITH TSH
FREE THYROXINE INDEX: 1.6 (ref 1.2–4.9)
T3 UPTAKE RATIO: 28 % (ref 24–39)
T4 TOTAL: 5.8 ug/dL (ref 4.5–12.0)
TSH: 1.19 u[IU]/mL (ref 0.450–4.500)

## 2016-09-05 LAB — IGG, IGA, IGM
IGA: 215 mg/dL (ref 87–352)
IGM, SERUM: 115 mg/dL (ref 26–217)
IgG (Immunoglobin G), Serum: 653 mg/dL — ABNORMAL LOW (ref 700–1600)

## 2016-09-05 LAB — IGE: IgE (Immunoglobulin E), Serum: 483 IU/mL — ABNORMAL HIGH (ref 0–100)

## 2016-09-07 LAB — COMP PANEL: LEUKEMIA/LYMPHOMA

## 2016-09-16 NOTE — Progress Notes (Signed)
[place]  [phone] [phone]) [phone]  ID: Kristine [name] OB: 02/02/1971  MR#: [id number]  [id number]  Patient Care Team: Marinda Elk, MD as PCP - General (Physician Assistant)  CHIEF COMPLAINT:  Idiopathic urticaria  INTERVAL HISTORY: Patient returns to clinic today for further evaluation and discussion of her laboratory work. She continues to have intermittent hives. She also complains of hair loss as well as recurrent UTIs and hematuria. She otherwise feels well. She denies any fevers, night sweats, or weight loss. She has no neurologic complaints. She denies any chest pain or shortness of breath. She has no nausea, vomiting, constipation, or diarrhea. She has no urinary complaints. Patient offers no further specific complaints.  REVIEW OF SYSTEMS:   Review of Systems  Constitutional: Negative.  Negative for fever, malaise/fatigue and weight loss.  Respiratory: Negative.  Negative for cough and shortness of breath.   Cardiovascular: Negative.  Negative for chest pain and leg swelling.  Gastrointestinal: Negative.  Negative for abdominal pain, blood in stool and melena.  Genitourinary: Positive for hematuria.  Musculoskeletal: Negative.   Skin: Positive for itching and rash.  Neurological: Negative.  Negative for sensory change and weakness.  Psychiatric/Behavioral: The patient is nervous/anxious.    As per HPI. Otherwise, a complete review of systems is negative.   PAST MEDICAL HISTORY: Past Medical History:  Diagnosis Date  . Abnormal weight gain 08/05/2013  . Absence of interventricular septum 08/05/2013  . Allergic rhinitis 04/17/2016  . Allergic urticaria due to ingested food 02/09/2016  . Anxiety   . Asthma   . Clinical depression 09/30/2013  . Complication of anesthesia   . Depression   . Dizziness   . Excess weight 09/30/2013  . GERD (gastroesophageal reflux disease)   . Headache, migraine 09/30/2013  . Heart murmur    . Heavy drinker 04/11/2014   Overview:  Patient reports drinking 2-3 bottles of wine each weekend.    . Infection of urinary tract 09/30/2013  . Irregular bleeding 09/30/2013    PAST SURGICAL HISTORY: Past Surgical History:  Procedure Laterality Date  . BREAST BIOPSY Left 11/13/13   benign  . CESAREAN SECTION    . COLONOSCOPY    . CORONARY ANGIOPLASTY    . CYSTOSCOPY WITH HYDRODISTENSION AND BIOPSY    . LAPAROSCOPIC ASSISTED VAGINAL HYSTERECTOMY N/A 08/20/2016   Procedure: LAPAROSCOPIC ASSISTED VAGINAL HYSTERECTOMY;  Surgeon: Boykin Nearing, MD;  Location: [facility] ORS;  Service: Gynecology;  Laterality: N/A;  . LAPAROSCOPIC BILATERAL SALPINGECTOMY Bilateral 08/20/2016   Procedure: LAPAROSCOPIC BILATERAL SALPINGECTOMY;  Surgeon: Boykin Nearing, MD;  Location: [facility] ORS;  Service: Gynecology;  Laterality: Bilateral;  . NASAL SINUS SURGERY     x12    FAMILY HISTORY: Family History  Problem Relation Age of Onset  . Breast cancer Maternal Aunt   . Breast cancer Paternal Aunt   . Breast cancer Maternal Grandmother   . Breast cancer Paternal Grandmother   . Breast cancer Paternal Aunt   . Kidney cancer Maternal Uncle   . Prostate cancer Paternal Grandfather   . Kidney disease      father's side  . Aneurysm Mother   . Liver disease Mother   . Cancer Father     ADVANCED DIRECTIVES (Y/N):  N  HEALTH MAINTENANCE: Social History  Substance Use Topics  . Smoking status: Former Research scientist (life sciences)  . Smokeless tobacco: Never Used     Comment: quit 2008  . Alcohol use No     Colonoscopy:  PAP:  Bone density:  Lipid panel:  Allergies  Allergen Reactions  . Bee Pollen Anaphylaxis  . Bee Venom Anaphylaxis  . Keflex [Cephalexin] Anaphylaxis  . Codeine     Other reaction(s): RASH  . Eggs Or Egg-Derived Products   . Ferrous Gluconate Diarrhea    And vomiting  . Hydrocodone Hives  . Iron Diarrhea  . Lactase Other (See Comments)  . Milk-Related Compounds   . Penicillins   .  Shellfish Allergy Other (See Comments)  . Soy Allergy   . Wheat Bran   . Iodine Rash  . Latex Itching and Rash    Current Outpatient Prescriptions  Medication Sig Dispense Refill  . albuterol (VENTOLIN HFA) 108 (90 Base) MCG/ACT inhaler Inhale 2 puffs into the lungs every 4 (four) hours as needed.     . Azelastine HCl 0.15 % SOLN Place 2 sprays into the nose 2 (two) times daily.     . B Complex Vitamins (VITAMIN B COMPLEX PO) Take 1 tablet by mouth daily.    . Biotin 10000 MCG TABS Take 10,000 mg by mouth daily.    . cetirizine (ZYRTEC) 10 MG tablet Take 2 tablets by mouth 2 (two) times daily.     . Cholecalciferol (VITAMIN D-1000 MAX ST) 1000 units tablet Take 1 tablet by mouth daily.    Marland Kitchen CRANBERRY PO Take 1 capsule by mouth daily.    Marland Kitchen darifenacin (ENABLEX) 15 MG 24 hr tablet Take 15 mg by mouth daily.    Marland Kitchen EPINEPHrine 0.3 mg/0.3 mL IJ SOAJ injection Inject 0.3 mLs (0.3 mg total) into the muscle once. 1 Device 1  . fluticasone (FLONASE) 50 MCG/ACT nasal spray Place 1 spray into both nostrils daily.    . fluticasone (FLOVENT HFA) 220 MCG/ACT inhaler Inhale 1 puff into the lungs 2 (two) times daily.     . Fluticasone-Salmeterol (ADVAIR) 250-50 MCG/DOSE AEPB Inhale 1 puff into the lungs 2 (two) times daily.    . hydrochlorothiazide (HYDRODIURIL) 12.5 MG tablet Take 12.5 mg by mouth daily.    . hydroxychloroquine (PLAQUENIL) 200 MG tablet Take 400 mg by mouth daily.     . hydrOXYzine (ATARAX/VISTARIL) 25 MG tablet Take 25 mg by mouth 3 (three) times daily.     . metroNIDAZOLE (FLAGYL) 500 MG tablet Take 500 mg by mouth 2 (two) times daily.    . montelukast (SINGULAIR) 10 MG tablet Take 10 mg by mouth 2 (two) times daily.     . Multiple Vitamins-Minerals (MULTI-VITAMIN GUMMIES PO) Take 1 tablet by mouth daily.    . nitrofurantoin, macrocrystal-monohydrate, (MACROBID) 100 MG capsule Take 100 mg by mouth daily.    . predniSONE (DELTASONE) 10 MG tablet Take 20 mg by mouth.    . ranitidine  (ZANTAC) 150 MG tablet Take 150 mg by mouth 2 (two) times daily.      No current facility-administered medications for this visit.     OBJECTIVE: Vitals:   09/17/16 1149  BP: 122/87  Pulse: 99  Temp: 98.4 F (36.9 C)     Body mass index is 31.07 kg/m.    ECOG FS:0 - Asymptomatic  General: Well-developed, well-nourished, no acute distress. Eyes: Pink conjunctiva, anicteric sclera. HEENT: Normocephalic, moist mucous membranes, clear oropharnyx. Lungs: Clear to auscultation bilaterally. Heart: Regular rate and rhythm. No rubs, murmurs, or gallops. Abdomen: Soft, nontender, nondistended. No organomegaly noted, normoactive bowel sounds. Musculoskeletal: No edema, cyanosis, or clubbing. Neuro: Alert, answering all questions appropriately. Cranial nerves grossly intact. Skin: No rashes or petechiae  noted. Psych: Normal affect. Lymphatics: No cervical, calvicular, axillary or inguinal LAD.   LAB RESULTS:  Lab Results  Component Value Date   NA 137 09/04/2016   K 3.5 09/04/2016   CL 97 (L) 09/04/2016   CO2 29 09/04/2016   GLUCOSE 112 (H) 09/04/2016   BUN 15 09/04/2016   CREATININE 0.75 09/04/2016   CALCIUM 9.3 09/04/2016   PROT 7.4 09/04/2016   ALBUMIN 4.1 09/04/2016   AST 27 09/04/2016   ALT 32 09/04/2016   ALKPHOS 44 09/04/2016   BILITOT 0.3 09/04/2016   GFRNONAA >60 09/04/2016   GFRAA >60 09/04/2016    Lab Results  Component Value Date   WBC 13.3 (H) 09/04/2016   NEUTROABS 10.3 (H) 09/04/2016   HGB 14.2 09/04/2016   HCT 43.0 09/04/2016   MCV 85.5 09/04/2016   PLT 381 09/04/2016     STUDIES: No results found.  ASSESSMENT:  Idiopathic urticaria  PLAN:   1.  Idiopathic urticaria: Unclear etiology. Patient has a significantly elevated IgE level, but otherwise the remainder of her laboratory work including sedimentation rate, comprehensive metabolic panel, thyroid panel, ANA, peripheral blood flow cytometry, CBC, SPEP with immunoglobulins, and tryptase levels  are all within normal limits. Her eosinophils are also within normal limits. Unclear etiology of her recurrent urticaria, but highly unlikely to be related to underlying malignancy. CT scan of the chest completed at [facility] on September 10, 2016 did not reveal any distinct pathology.  2. Leukocytosis: Mild and likely reactive. Given the neutrophil predominance, can consider BCR-ABL the future. 3. Elevated IgE levels: Likely secondary to immune response of unclear etiology. 4. Hematuria/recurrent UTI: In the setting of persistent urticaria, will get a CT of the abdomen and pelvis for completeness. If negative, no further follow-up is necessary. If positive patient will return to clinic 1-2 days later to discuss the results.  Approximately 30 minutes was spent in discussion of which greater than 50% was consultation.   Patient expressed understanding and was in agreement with this plan. She also understands that She can call clinic at any time with any questions, concerns, or complaints.   No matching staging information was found for the patient.  Lloyd Huger, MD   09/17/2016 12:38 PM

## 2016-09-17 ENCOUNTER — Encounter: Payer: Self-pay | Admitting: Oncology

## 2016-09-17 ENCOUNTER — Inpatient Hospital Stay: Payer: 59 | Attending: Oncology | Admitting: Oncology

## 2016-09-17 VITALS — BP 122/87 | HR 99 | Temp 98.4°F | Wt 169.9 lb

## 2016-09-17 DIAGNOSIS — R319 Hematuria, unspecified: Secondary | ICD-10-CM | POA: Diagnosis not present

## 2016-09-17 DIAGNOSIS — L501 Idiopathic urticaria: Secondary | ICD-10-CM | POA: Diagnosis not present

## 2016-09-17 DIAGNOSIS — F418 Other specified anxiety disorders: Secondary | ICD-10-CM

## 2016-09-17 DIAGNOSIS — N921 Excessive and frequent menstruation with irregular cycle: Secondary | ICD-10-CM | POA: Insufficient documentation

## 2016-09-17 DIAGNOSIS — Z87891 Personal history of nicotine dependence: Secondary | ICD-10-CM | POA: Diagnosis not present

## 2016-09-17 DIAGNOSIS — Z79899 Other long term (current) drug therapy: Secondary | ICD-10-CM | POA: Diagnosis not present

## 2016-09-17 DIAGNOSIS — Z8042 Family history of malignant neoplasm of prostate: Secondary | ICD-10-CM

## 2016-09-17 DIAGNOSIS — Z880 Allergy status to penicillin: Secondary | ICD-10-CM | POA: Diagnosis not present

## 2016-09-17 DIAGNOSIS — F1010 Alcohol abuse, uncomplicated: Secondary | ICD-10-CM | POA: Diagnosis not present

## 2016-09-17 DIAGNOSIS — Z87440 Personal history of urinary (tract) infections: Secondary | ICD-10-CM | POA: Insufficient documentation

## 2016-09-17 DIAGNOSIS — K219 Gastro-esophageal reflux disease without esophagitis: Secondary | ICD-10-CM | POA: Insufficient documentation

## 2016-09-17 DIAGNOSIS — R7989 Other specified abnormal findings of blood chemistry: Secondary | ICD-10-CM | POA: Diagnosis not present

## 2016-09-17 DIAGNOSIS — Z803 Family history of malignant neoplasm of breast: Secondary | ICD-10-CM

## 2016-09-17 DIAGNOSIS — D72829 Elevated white blood cell count, unspecified: Secondary | ICD-10-CM

## 2016-09-17 DIAGNOSIS — Z8051 Family history of malignant neoplasm of kidney: Secondary | ICD-10-CM | POA: Diagnosis not present

## 2016-09-19 ENCOUNTER — Telehealth: Payer: Self-pay | Admitting: *Deleted

## 2016-09-19 ENCOUNTER — Other Ambulatory Visit: Payer: Self-pay | Admitting: *Deleted

## 2016-09-19 ENCOUNTER — Ambulatory Visit: Payer: 59

## 2016-09-19 DIAGNOSIS — R319 Hematuria, unspecified: Secondary | ICD-10-CM

## 2016-09-19 DIAGNOSIS — R42 Dizziness and giddiness: Secondary | ICD-10-CM | POA: Insufficient documentation

## 2016-09-19 NOTE — Telephone Encounter (Signed)
Allergy to dye prep faxed to [facility] per patient request

## 2016-09-19 NOTE — Telephone Encounter (Signed)
Called to report that her CT has been scheduled for Monday and they told her to call us to get the prep called in (she is allergic to iodine). She asked she be called back after it is called in. [phone]

## 2016-09-22 ENCOUNTER — Emergency Department
Admission: EM | Admit: 2016-09-22 | Discharge: 2016-09-23 | Disposition: A | Discharge status: Home / Self Care | Payer: 59 | Attending: Emergency Medicine | Admitting: Emergency Medicine

## 2016-09-22 ENCOUNTER — Encounter: Payer: Self-pay | Admitting: Emergency Medicine

## 2016-09-22 DIAGNOSIS — J392 Other diseases of pharynx: Secondary | ICD-10-CM | POA: Insufficient documentation

## 2016-09-22 DIAGNOSIS — Z91040 Latex allergy status: Secondary | ICD-10-CM | POA: Diagnosis not present

## 2016-09-22 DIAGNOSIS — Z79899 Other long term (current) drug therapy: Secondary | ICD-10-CM | POA: Diagnosis not present

## 2016-09-22 DIAGNOSIS — R0989 Other specified symptoms and signs involving the circulatory and respiratory systems: Secondary | ICD-10-CM

## 2016-09-22 DIAGNOSIS — J45909 Unspecified asthma, uncomplicated: Secondary | ICD-10-CM | POA: Diagnosis not present

## 2016-09-22 DIAGNOSIS — T7840XA Allergy, unspecified, initial encounter: Secondary | ICD-10-CM | POA: Insufficient documentation

## 2016-09-22 DIAGNOSIS — Z87891 Personal history of nicotine dependence: Secondary | ICD-10-CM | POA: Diagnosis not present

## 2016-09-22 DIAGNOSIS — R6889 Other general symptoms and signs: Secondary | ICD-10-CM

## 2016-09-22 MED ORDER — DIPHENHYDRAMINE HCL 50 MG/ML IJ SOLN
25.0000 mg | Freq: Once | INTRAMUSCULAR | Status: AC
  Administered 2016-09-22: 25 mg via INTRAVENOUS
  Filled 2016-09-22: qty 1

## 2016-09-22 MED ORDER — METHYLPREDNISOLONE SODIUM SUCC 125 MG IJ SOLR
125.0000 mg | Freq: Once | INTRAMUSCULAR | Status: AC
  Administered 2016-09-22: 125 mg via INTRAVENOUS
  Filled 2016-09-22: qty 2

## 2016-09-22 MED ORDER — EPINEPHRINE 0.3 MG/0.3ML IJ SOAJ
0.3000 mg | Freq: Once | INTRAMUSCULAR | Status: AC
  Administered 2016-09-22: 0.3 mg via INTRAMUSCULAR
  Filled 2016-09-22: qty 0.3

## 2016-09-22 MED ORDER — SODIUM CHLORIDE 0.9 % IV BOLUS (SEPSIS)
1000.0000 mL | Freq: Once | INTRAVENOUS | Status: AC
  Administered 2016-09-23: 1000 mL via INTRAVENOUS

## 2016-09-22 NOTE — ED Triage Notes (Signed)
Pt brought to triage via wheelchair. Pt reports she is short of breath this evening. Pt also reports she feels dizzy. Pt used her inhaler at home with no relief. Pt states "I just can't breath".

## 2016-09-23 ENCOUNTER — Telehealth: Payer: Self-pay | Admitting: Hematology and Oncology

## 2016-09-23 MED ORDER — DIPHENHYDRAMINE HCL 25 MG PO CAPS
25.0000 mg | ORAL_CAPSULE | Freq: Once | ORAL | Status: AC
  Administered 2016-09-23: 25 mg via ORAL
  Filled 2016-09-23: qty 1

## 2016-09-23 MED ORDER — HYDROXYZINE HCL 25 MG PO TABS
25.0000 mg | ORAL_TABLET | Freq: Once | ORAL | Status: AC
  Administered 2016-09-23: 25 mg via ORAL
  Filled 2016-09-23: qty 1

## 2016-09-23 MED ORDER — ONDANSETRON HCL 4 MG/2ML IJ SOLN
4.0000 mg | Freq: Once | INTRAMUSCULAR | Status: AC
  Administered 2016-09-23: 4 mg via INTRAVENOUS

## 2016-09-23 MED ORDER — ONDANSETRON HCL 4 MG/2ML IJ SOLN
INTRAMUSCULAR | Status: AC
  Administered 2016-09-23: 4 mg via INTRAVENOUS
  Filled 2016-09-23: qty 2

## 2016-09-23 MED ORDER — HYDROXYZINE HCL 25 MG PO TABS: 25.0000 mg | ORAL_TABLET | Freq: Three times a day (TID) | ORAL | 0 refills | Status: DC | PRN

## 2016-09-23 NOTE — Telephone Encounter (Signed)
Re:  CT prep  Patient called about CT prep for tomorrow.  She was just in the ER this morning with "an allergic reaction to something".  She is on around the clock steroids, ranitidine, Zyrtec, and hydroxyzine.  She received epinephrine in the ER.  She states that she feels like she is going to need to give herself another epinephrine injection.  I discussed managing her current allergic reaction as directed by the ER physician.  She may need to return to the ER.  I would not proceed with CT scan with contrast tomorrow as she is already having difficulty today.  She has a known allergy to contrast dye.  Scan should be postponed until the current reaction has resolved and possibly be admitted for scans in a controlled situation in the future.   Lequita Asal, MD
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
## 2016-09-23 NOTE — ED Notes (Signed)
Pt is in good condition, discharge instructions reviewed, prescription medication reviewed, follow up care and home care reviewed; pt verbalized understanding; pt is ambulatory and went home with spouse

## 2016-09-24 ENCOUNTER — Ambulatory Visit: Admission: RE | Admit: 2016-09-24 | Payer: 59 | Source: Ambulatory Visit

## 2016-09-25 ENCOUNTER — Other Ambulatory Visit: Payer: Self-pay | Admitting: *Deleted

## 2016-09-25 ENCOUNTER — Ambulatory Visit: Payer: 59

## 2016-09-25 DIAGNOSIS — R51 Headache: Principal | ICD-10-CM

## 2016-09-25 DIAGNOSIS — R519 Headache, unspecified: Secondary | ICD-10-CM

## 2016-10-09 ENCOUNTER — Ambulatory Visit: Admission: RE | Admit: 2016-10-09 | Payer: 59 | Source: Ambulatory Visit

## 2016-10-09 NOTE — Patient Instructions (Signed)
  Your procedure is scheduled on: 10-16-16 (TUESDAY) Report to Same Day Surgery 2nd floor medical mall [facility] Entrance-take elevator on left to 2nd floor.  Check in with surgery information desk.) To find out your arrival time please call [phone] between 1PM - 3PM on 10-15-16 [facility])  Remember: Instructions that are not followed completely may result in serious medical risk, up to and including death, or upon the discretion of your surgeon and anesthesiologist your surgery may need to be rescheduled.    _x___ 1. Do not eat food or drink liquids after midnight. No gum chewing or hard candies.     __x__ 2. No Alcohol for 24 hours before or after surgery.   __x__3. No Smoking for 24 prior to surgery.   ____  4. Bring all medications with you on the day of surgery if instructed.    __x__ 5. Notify your doctor if there is any change in your medical condition     (cold, fever, infections).     Do not wear jewelry, make-up, hairpins, clips or nail polish.  Do not wear lotions, powders, or perfumes. You may wear deodorant.  Do not shave 48 hours prior to surgery. Men may shave face and neck.  Do not bring valuables to the hospital.    [facility] is not responsible for any belongings or valuables.               Contacts, dentures or bridgework may not be worn into surgery.  Leave your suitcase in the car. After surgery it may be brought to your room.  For patients admitted to the hospital, discharge time is determined by your treatment team.   Patients discharged the day of surgery will not be allowed to drive home.  You will need someone to drive you home and stay with you the night of your procedure.    Please read over the following fact sheets that you were given:   [facility] Preparing for Surgery and or MRSA Information   _x___ Take these medicines the morning of surgery with A SIP OF WATER:    1. ZANTAC  2. PREDNISONE  3. SINGULAIR  4. ZYRTEC  5.   6.  ____Fleets  enema or Magnesium Citrate as directed.   ____ Use CHG Soap or sage wipes as directed on instruction sheet   _X___ Use inhalers on the day of surgery and bring to hospital day of surgery-USE ADVAIR AND ALBUTEROL [place]  ____ Stop metformin 2 days prior to surgery    ____ Take 1/2 of usual insulin dose the night before surgery and none on the morning of surgery.   ____ Stop Aspirin, Coumadin, Pllavix ,Eliquis, Effient, or Pradaxa  x__ Stop Anti-inflammatories such as Advil, Aleve, Ibuprofen, Motrin, Naproxen,          Naprosyn, Goodies powders or aspirin products. Ok to take Tylenol.   _X___ Stop supplements until after surgery-STOP BIOTIN, B COMPLEX AND CRANBERRY NOW  ____ Bring C-Pap to the hospital.

## 2016-10-10 ENCOUNTER — Encounter
Admission: RE | Admit: 2016-10-10 | Discharge: 2016-10-10 | Disposition: A | Discharge status: Home / Self Care | Payer: 59 | Source: Ambulatory Visit | Attending: Urology | Admitting: Urology

## 2016-10-10 DIAGNOSIS — N3281 Overactive bladder: Secondary | ICD-10-CM | POA: Diagnosis not present

## 2016-10-10 DIAGNOSIS — Z01812 Encounter for preprocedural laboratory examination: Secondary | ICD-10-CM | POA: Diagnosis not present

## 2016-10-10 HISTORY — DX: Allergy status to unspecified drugs, medicaments and biological substances: Z88.9

## 2016-10-10 HISTORY — DX: Personal history of Methicillin resistant Staphylococcus aureus infection: Z86.14

## 2016-10-10 LAB — SURGICAL PCR SCREEN
MRSA, PCR: NEGATIVE
Staphylococcus aureus: NEGATIVE

## 2016-10-12 ENCOUNTER — Ambulatory Visit: Payer: 59

## 2016-10-12 NOTE — H&P (Deleted)
  The note originally documented on this encounter has been moved the the encounter in which it belongs.  

## 2016-10-12 NOTE — H&P (Signed)
NAMEYAZAIRA, GAMBONE              ACCOUNT NO.:  [id number]  MEDICAL RECORD NO.:  [id number]  LOCATION:                                 FACILITY:  PHYSICIAN:  Maryan Puls          DATE OF BIRTH:  04/23/1971  DATE OF ADMISSION: DATE OF DISCHARGE:                            HISTORY AND PHYSICAL   Same-day surgery, December 5.  CHIEF COMPLAINT:  Overactive bladder.  HISTORY OF PRESENT ILLNESS:  Kristine [name] is a 45 year old Caucasian female with a long history of overactive bladder.  Symptoms include frequency, urgency, and nocturia.  She also has urge incontinence.  She has failed a trial of Toviaz.  Myrbetriq was effective, but not covered by her insurance plan.  She comes in now for bladder Botox injection. The patient also has a history of interstitial cystitis, but currently does not have pelvic or bladder pain.  She has a history of recurrent lower urinary tract infections, but denied dysuria.  PAST MEDICAL HISTORY:  The patient is allergic to: 1. Penicillin. 2. Keflex. 3. Latex. 4. Codeine. 5. Iron supplements. 6. She also had numerous food allergies that included eggs, wheat,     beef, potatoes, tomatoes, grapes, strawberries, peanuts, soy bean,     yeast, malt, corn, and cheese.  CURRENT MEDICATIONS:  Included prednisone, Flovent, hydroxyzine, Plaquenil, ranitidine, hydrochlorothiazide, Zyrtec, albuterol, montelukast, vitamin D, Azelastine, multivitamins, Flonase, biotin.  PAST SURGICAL HISTORY: 1. C-sections 1988, 2003. 2. Tonsillectomy, 1998. 3. Multiple sinus surgeries from 1988 through 2003. 4. Heart catheterization in 1970 and 2014.  SOCIAL HISTORY:  The patient quit smoking in 2008 with a 20 pack year history.  She denied alcohol use.  FAMILY HISTORY:  Father died of myocardial infarction at age 77.  The patient has 3 siblings with heart murmurs.  PAST AND CURRENT MEDICAL CONDITIONS: 1. Asthma. 2. Environmental allergies. 3. Ventricular septal defect  without symptoms.  REVIEW OF SYSTEMS:  The patient denies chest pain or heart disease.  She also denied hypertension and stroke.  She has chronic hoarseness, chronic cough, allergic rhinitis, wheezing, shortness of breath, hot and cold intolerance, weakness, fatigue, excessive thirst, and has frequent nosebleeds.  She does have history of vaginal bleeding.  PHYSICAL EXAMINATION:  GENERAL:  Well-nourished white female, in no acute distress. HEENT:  Sclerae are clear.  Pupils are equally round and reactive to light and accommodation.  Extraocular movements are intact. NECK:  Supple.  No palpable cervical adenopathy. LUNGS:  Clear to auscultation. CARDIOVASCULAR:  Regular rhythm and rate without audible murmurs. ABDOMEN:  Soft, nontender. GU:  Normal external genitalia with good anterior support and normal urethral meatus. NEUROMUSCULAR:  Alert and oriented x3.  IMPRESSION: 1. Overactive bladder with urge incontinence. 2. History of interstitial cystitis. 3. Recurrent cystitis.  PLAN:  Cystoscopy with bladder Botox injection.    ______________________________ Maryan Puls   ______________________________ Maryan Puls    MW/MEDQ  D:  10/11/2016  T:  10/12/2016  Job:  [id number]

## 2016-10-15 DIAGNOSIS — H6063 Unspecified chronic otitis externa, bilateral: Secondary | ICD-10-CM | POA: Insufficient documentation

## 2016-10-16 ENCOUNTER — Ambulatory Visit: Admit: 2016-10-16 | Payer: 59 | Admitting: Urology

## 2016-10-16 SURGERY — CYSTOSCOPY
Anesthesia: Choice

## 2016-10-22 ENCOUNTER — Encounter: Payer: Self-pay | Admitting: *Deleted

## 2016-10-22 NOTE — Pre-Procedure Instructions (Signed)
CALL FROM PATIENT AND REVIEWED MEDS TO TAKE IN AM AND REINFORCED NPO AFTER MN

## 2016-10-23 ENCOUNTER — Encounter: Payer: Self-pay | Admitting: Anesthesiology

## 2016-10-23 ENCOUNTER — Encounter
Admission: RE | Disposition: A | Discharge status: Home / Self Care | Payer: Self-pay | Source: Ambulatory Visit | Attending: Urology

## 2016-10-23 ENCOUNTER — Ambulatory Visit: Payer: 59 | Admitting: Anesthesiology

## 2016-10-23 ENCOUNTER — Ambulatory Visit
Admission: RE | Admit: 2016-10-23 | Discharge: 2016-10-23 | Disposition: A | Discharge status: Home / Self Care | Payer: 59 | Source: Ambulatory Visit | Attending: Urology | Admitting: Urology

## 2016-10-23 DIAGNOSIS — Q210 Ventricular septal defect: Secondary | ICD-10-CM | POA: Diagnosis not present

## 2016-10-23 DIAGNOSIS — N3010 Interstitial cystitis (chronic) without hematuria: Secondary | ICD-10-CM | POA: Insufficient documentation

## 2016-10-23 DIAGNOSIS — Z888 Allergy status to other drugs, medicaments and biological substances status: Secondary | ICD-10-CM | POA: Diagnosis not present

## 2016-10-23 DIAGNOSIS — F418 Other specified anxiety disorders: Secondary | ICD-10-CM | POA: Insufficient documentation

## 2016-10-23 DIAGNOSIS — I739 Peripheral vascular disease, unspecified: Secondary | ICD-10-CM | POA: Diagnosis not present

## 2016-10-23 DIAGNOSIS — Z91018 Allergy to other foods: Secondary | ICD-10-CM | POA: Diagnosis not present

## 2016-10-23 DIAGNOSIS — Z87891 Personal history of nicotine dependence: Secondary | ICD-10-CM | POA: Diagnosis not present

## 2016-10-23 DIAGNOSIS — Z91040 Latex allergy status: Secondary | ICD-10-CM | POA: Diagnosis not present

## 2016-10-23 DIAGNOSIS — Z885 Allergy status to narcotic agent status: Secondary | ICD-10-CM | POA: Diagnosis not present

## 2016-10-23 DIAGNOSIS — Z881 Allergy status to other antibiotic agents status: Secondary | ICD-10-CM | POA: Insufficient documentation

## 2016-10-23 DIAGNOSIS — N3281 Overactive bladder: Secondary | ICD-10-CM | POA: Insufficient documentation

## 2016-10-23 DIAGNOSIS — Z79899 Other long term (current) drug therapy: Secondary | ICD-10-CM | POA: Diagnosis not present

## 2016-10-23 DIAGNOSIS — N3941 Urge incontinence: Secondary | ICD-10-CM | POA: Diagnosis not present

## 2016-10-23 DIAGNOSIS — J45909 Unspecified asthma, uncomplicated: Secondary | ICD-10-CM | POA: Insufficient documentation

## 2016-10-23 DIAGNOSIS — Z87440 Personal history of urinary (tract) infections: Secondary | ICD-10-CM | POA: Diagnosis not present

## 2016-10-23 DIAGNOSIS — Z880 Allergy status to penicillin: Secondary | ICD-10-CM | POA: Diagnosis not present

## 2016-10-23 HISTORY — PX: BOTOX INJECTION: SHX5754

## 2016-10-23 HISTORY — PX: CYSTOSCOPY: SHX5120

## 2016-10-23 SURGERY — CYSTOSCOPY
Anesthesia: General | Site: Bladder | Wound class: Clean Contaminated

## 2016-10-23 MED ORDER — GLYCOPYRROLATE 0.2 MG/ML IJ SOLN
INTRAMUSCULAR | Status: DC | PRN
  Administered 2016-10-23: 0.2 mg via INTRAVENOUS

## 2016-10-23 MED ORDER — ONABOTULINUMTOXINA 100 UNITS IJ SOLR
INTRAMUSCULAR | Status: DC | PRN
  Administered 2016-10-23: 100 [IU] via INTRAMUSCULAR

## 2016-10-23 MED ORDER — BELLADONNA ALKALOIDS-OPIUM 16.2-60 MG RE SUPP
RECTAL | Status: DC | PRN
  Administered 2016-10-23: 1 via RECTAL

## 2016-10-23 MED ORDER — LEVOFLOXACIN IN D5W 500 MG/100ML IV SOLN
500.0000 mg | Freq: Once | INTRAVENOUS | Status: AC
  Administered 2016-10-23: 500 mg via INTRAVENOUS

## 2016-10-23 MED ORDER — ONDANSETRON HCL 4 MG/2ML IJ SOLN
INTRAMUSCULAR | Status: AC
  Filled 2016-10-23: qty 2

## 2016-10-23 MED ORDER — LEVOFLOXACIN 500 MG PO TABS: 500.0000 mg | ORAL_TABLET | Freq: Every day | ORAL | 1 refills | Status: DC

## 2016-10-23 MED ORDER — MIDAZOLAM HCL 2 MG/2ML IJ SOLN
INTRAMUSCULAR | Status: DC | PRN
  Administered 2016-10-23: 2 mg via INTRAVENOUS

## 2016-10-23 MED ORDER — FLUCONAZOLE 200 MG PO TABS: 200.0000 mg | ORAL_TABLET | Freq: Every day | ORAL | 1 refills | Status: DC

## 2016-10-23 MED ORDER — FENTANYL CITRATE (PF) 100 MCG/2ML IJ SOLN
25.0000 ug | INTRAMUSCULAR | Status: DC | PRN
  Administered 2016-10-23 (×4): 25 ug via INTRAVENOUS

## 2016-10-23 MED ORDER — LACTATED RINGERS IV SOLN
INTRAVENOUS | Status: DC
  Administered 2016-10-23: 16:00:00 via INTRAVENOUS

## 2016-10-23 MED ORDER — LEVOFLOXACIN IN D5W 500 MG/100ML IV SOLN
INTRAVENOUS | Status: AC
  Administered 2016-10-23: 500 mg via INTRAVENOUS
  Filled 2016-10-23: qty 100

## 2016-10-23 MED ORDER — ETOMIDATE 2 MG/ML IV SOLN
INTRAVENOUS | Status: DC | PRN
  Administered 2016-10-23: 22 mg via INTRAVENOUS

## 2016-10-23 MED ORDER — ONDANSETRON HCL 4 MG/2ML IJ SOLN
4.0000 mg | Freq: Once | INTRAMUSCULAR | Status: AC | PRN
  Administered 2016-10-23: 4 mg via INTRAVENOUS

## 2016-10-23 MED ORDER — BELLADONNA ALKALOIDS-OPIUM 16.2-60 MG RE SUPP
RECTAL | Status: AC
  Filled 2016-10-23: qty 1

## 2016-10-23 MED ORDER — SODIUM CHLORIDE 0.9 % IJ SOLN
INTRAMUSCULAR | Status: AC
  Filled 2016-10-23: qty 50

## 2016-10-23 MED ORDER — FENTANYL CITRATE (PF) 100 MCG/2ML IJ SOLN
INTRAMUSCULAR | Status: DC | PRN
  Administered 2016-10-23: 75 ug via INTRAVENOUS
  Administered 2016-10-23: 25 ug via INTRAVENOUS

## 2016-10-23 MED ORDER — LIDOCAINE HCL 2 % EX GEL
CUTANEOUS | Status: DC | PRN
  Administered 2016-10-23: 1

## 2016-10-23 MED ORDER — FENTANYL CITRATE (PF) 100 MCG/2ML IJ SOLN
INTRAMUSCULAR | Status: AC
  Administered 2016-10-23: 25 ug via INTRAVENOUS
  Filled 2016-10-23: qty 2

## 2016-10-23 MED ORDER — LIDOCAINE HCL 2 % EX GEL
CUTANEOUS | Status: AC
  Filled 2016-10-23: qty 10

## 2016-10-23 MED ORDER — PENTAFLUOROPROP-TETRAFLUOROETH EX AERO
INHALATION_SPRAY | CUTANEOUS | Status: AC
  Filled 2016-10-23: qty 30

## 2016-10-23 MED ORDER — LACTATED RINGERS IV SOLN
INTRAVENOUS | Status: DC | PRN
  Administered 2016-10-23: 14:00:00 via INTRAVENOUS

## 2016-10-23 SURGICAL SUPPLY — 19 items
BAG DRAIN CYSTO-URO LG1000N (MISCELLANEOUS) ×2 IMPLANT
ELECT REM PT RETURN 9FT ADLT (ELECTROSURGICAL)
ELECTRODE REM PT RTRN 9FT ADLT (ELECTROSURGICAL) ×1 IMPLANT
GOWN STRL REUS W/ TWL LRG LVL3 (GOWN DISPOSABLE) ×1 IMPLANT
GOWN STRL REUS W/ TWL XL LVL3 (GOWN DISPOSABLE) ×1 IMPLANT
GOWN STRL REUS W/TWL LRG LVL3 (GOWN DISPOSABLE) ×2
GOWN STRL REUS W/TWL XL LVL3 (GOWN DISPOSABLE) ×2
KIT RM TURNOVER CYSTO AR (KITS) ×2 IMPLANT
NDL INJETAK FLEX 70CM BOTOX (NEEDLE) ×1 IMPLANT
NDL SAFETY ECLIPSE 18X1.5 (NEEDLE) ×2 IMPLANT
NEEDLE HYPO 18GX1.5 SHARP (NEEDLE) ×4
NEEDLE INJETAK FLEX 70CM BOTOX (NEEDLE) ×2 IMPLANT
PACK CYSTO AR (MISCELLANEOUS) ×2 IMPLANT
PREP PVP WINGED SPONGE (MISCELLANEOUS) ×1 IMPLANT
SET CYSTO W/LG BORE CLAMP LF (SET/KITS/TRAYS/PACK) ×2 IMPLANT
SURGILUBE 2OZ TUBE FLIPTOP (MISCELLANEOUS) ×2 IMPLANT
SYRINGE 10CC LL (SYRINGE) ×8 IMPLANT
WATER STERILE IRR 1000ML POUR (IV SOLUTION) ×2 IMPLANT
WATER STERILE IRR 3000ML UROMA (IV SOLUTION) ×2 IMPLANT

## 2016-10-23 NOTE — Discharge Instructions (Signed)
Overactive Bladder, Adult Introduction Overactive bladder is a group of urinary symptoms. With overactive bladder, you may suddenly feel the need to pass urine (urinate) right away. After feeling this sudden urge, you might also leak urine if you cannot get to the bathroom fast enough (urinary incontinence). These symptoms might interfere with your daily work or social activities. Overactive bladder symptoms may also wake you up at night. Overactive bladder affects the nerve signals between your bladder and your brain. Your bladder may get the signal to empty before it is full. Very sensitive muscles can also make your bladder squeeze too soon. What are the causes? Many things can cause an overactive bladder. Possible causes include:  Urinary tract infection.  Infection of nearby tissues, such as the prostate.  Prostate enlargement.  Being pregnant with twins or more (multiples).  Surgery on the uterus or urethra.  Bladder stones, inflammation, or tumors.  Drinking too much caffeine or alcohol.  Certain medicines, especially those that you take to help your body get rid of extra fluid (diuretics) by increasing urine production.  Muscle or nerve weakness, especially from:  A spinal cord injury.  Stroke.  Multiple sclerosis.  Parkinson disease.  Diabetes. This can cause a high urine volume that fills the bladder so quickly that the normal urge to urinate is triggered very strongly.  Constipation. A buildup of too much stool can put pressure on your bladder. What increases the risk? You may be at greater risk for overactive bladder if you:  Are an older adult.  Smoke.  Are going through menopause.  Have prostate problems.  Have a neurological disease, such as stroke, dementia, Parkinson disease, or multiple sclerosis (MS).  Eat or drink things that irritate the bladder. These include alcohol, spicy food, and caffeine.  Are overweight or obese. What are the signs or  symptoms? The signs and symptoms of an overactive bladder include:  Sudden, strong urges to urinate.  Leaking urine.  Urinating eight or more times per day.  Waking up to urinate two or more times per night. How is this diagnosed? Your health care provider may suspect overactive bladder based on your symptoms. The health care provider will do a physical exam and take your medical history. Blood or urine tests may also be done. For example, you might need to have a bladder function test to check how well you can hold your urine. You might also need to see a health care provider who specializes in the urinary tract (urologist). How is this treated? Treatment for overactive bladder depends on the cause of your condition and whether it is mild or severe. Certain treatments can be done in your health care provider's office or clinic. You can also make lifestyle changes at home. Options include: Behavioral Treatments  Biofeedback. A specialist uses sensors to help you become aware of your bodys signals.  Keeping a daily log of when you need to urinate and what happens after the urge. This may help you manage your condition.  Bladder training. This helps you learn to control the urge to urinate by following a schedule that directs you to urinate at regular intervals (timed voiding). At first, you might have to wait a few minutes after feeling the urge. In time, you should be able to schedule bathroom visits an hour or more apart.  Kegel exercises. These are exercises to strengthen the pelvic floor muscles, which support the bladder. Toning these muscles can help you control urination, even if your bladder muscles  are overactive. A specialist will teach you how to do these exercises correctly. They require daily practice.  Weight loss. If you are obese or overweight, losing weight might relieve your symptoms of overactive bladder. Talk to your health care provider about losing weight and whether  there is a specific program or method that would work best for you.  Diet change. This might help if constipation is making your overactive bladder worse. Your health care provider or a dietitian can explain ways to change what you eat to ease constipation. You might also need to consume less alcohol and caffeine or drink other fluids at different times of the day.  Stopping smoking.  Wearing pads to absorb leakage while you wait for other treatments to take effect. Physical Treatments  Electrical stimulation. Electrodes send gentle pulses of electricity to strengthen the nerves or muscles that help to control the bladder. Sometimes, the electrodes are placed outside of the body. In other cases, they might be placed inside the body (implanted). This treatment can take several months to have an effect.  Supportive devices. Women may need a plastic device that fits into the vagina and supports the bladder (pessary). Medicines  Several medicines can help treat overactive bladder and are usually used along with other treatments. Some are injected into the muscles involved in urination. Others come in pill form. Your health care provider may prescribe:  Antispasmodics. These medicines block the signals that the nerves send to the bladder. This keeps the bladder from releasing urine at the wrong time.  Tricyclic antidepressants. These types of antidepressants also relax bladder muscles. Surgery  You may have a device implanted to help manage the nerve signals that indicate when you need to urinate.  You may have surgery to implant electrodes for electrical stimulation.  Sometimes, very severe cases of overactive bladder require surgery to change the shape of the bladder. Follow these instructions at home:  Take medicines only as directed by your health care provider.  Use any implants or a pessary as directed by your health care provider.  Make any diet or lifestyle changes that are  recommended by your health care provider. These might include:  Drinking less fluid or drinking at different times of the day. If you need to urinate often during the night, you may need to stop drinking fluids early in the evening.  Cutting down on caffeine or alcohol. Both can make an overactive bladder worse. Caffeine is found in coffee, tea, and sodas.  Doing Kegel exercises to strengthen muscles.  Losing weight if you need to.  Eating a healthy and balanced diet to prevent constipation.  Keep a journal or log to track how much and when you drink and also when you feel the need to urinate. This will help your health care provider to monitor your condition. Contact a health care provider if:  Your symptoms do not get better after treatment.  Your pain and discomfort are getting worse.  You have more frequent urges to urinate.  You have a fever. Get help right away if: You are not able to control your bladder at all. This information is not intended to replace advice given to you by your health care provider. Make sure you discuss any questions you have with your health care provider. Document Released: 08/25/2009 Document Revised: 04/05/2016 Document Reviewed: 03/24/2014  2017 Elsevier Botulinum Toxin Bladder Injection, Care After Introduction Refer to this sheet in the next few weeks. These instructions provide you with information about  caring for yourself after your procedure. Your health care provider may also give you more specific instructions. Your treatment has been planned according to current medical practices, but problems sometimes occur. Call your health care provider if you have any problems or questions after your procedure. What can I expect after the procedure? After the procedure, it is common to have:  Blood-tinged urine.  Burning or soreness when you pass urine. Follow these instructions at home:   Do not drive for 24 hours if you received a  sedative.  Take over-the-counter and prescription medicines only as told by your health care provider.  If you were prescribed an antibiotic medicine, take it as told by your health care provider. Do not stop taking the antibiotic even if you start to feel better.  Drink enough fluid to keep your urine clear or pale yellow.  Return to your normal activities as told by your health care provider. Ask your health care provider what activities are safe for you.  Keep all follow-up visits as told by your health care provider. This is important. Contact a health care provider if:  You have a fever or chills.  You have blood-tinged urine for more than one day after your procedure.  You have worsening pain or burning when you pass urine.  You have pain or burning when passing urine for more than two days after your procedure.  You have trouble emptying your bladder. Get help right away if:  You have bright red blood in your urine.  You are unable to pass urine. This information is not intended to replace advice given to you by your health care provider. Make sure you discuss any questions you have with your health care provider. Document Released: 07/20/2015 Document Revised: 05/18/2016 Document Reviewed: 04/27/2015  2017 Elsevier  AMBULATORY SURGERY  DISCHARGE INSTRUCTIONS   1) The drugs that you were given will stay in your system until tomorrow so for the next 24 hours you should not:  A) Drive an automobile B) Make any legal decisions C) Drink any alcoholic beverage   2) You may resume regular meals tomorrow.  Today it is better to start with liquids and gradually work up to solid foods.  You may eat anything you prefer, but it is better to start with liquids, then soup and crackers, and gradually work up to solid foods.   3) Please notify your doctor immediately if you have any unusual bleeding, trouble breathing, redness and pain at the surgery site, drainage, fever, or  pain not relieved by medication.    4) Additional Instructions:        Please contact your physician with any problems or Same Day Surgery at [phone], Monday through Friday 6 am to 4 pm, or [place] at [facility] number at [phone].

## 2016-10-23 NOTE — OR Nursing (Signed)
1620 Patient states she wants to go home and take ibuprofen like she usually does and get some caffeine.  She refuses anything here.  I just want to go home.

## 2016-10-23 NOTE — Op Note (Signed)
Preoperative diagnosis: Overactive bladder with urge incontinence  Postoperative diagnosis: Same   Procedure: Cystoscopy with bladder Botox injection    Surgeon: Otelia Limes. Yves Dill MD  Anesthesia: General  Indications:See the history and physical. After informed consent the above procedure(s) were requested     Technique and findings: After adequate general anesthesia had been obtained patient was placed into dorsal lithotomy position and the perineum was prepped and draped in the usual fashion. The bladder was thoroughly inspected. Both ureteral orifices were identified and had clear reflux. No bladder mucosal lesions were identified. The injection needle was introduced through the scope and a standard 20 puncture injection technique of 0.5 cc per injection was performed. A total 100 units of Botox was injected. The bladder was drained and the cystoscope was removed. 10 cc of viscous Xylocaine was instilled within the urethra and the bladder. A B&O suppository was placed. Procedure was then terminated and patient transferred to the recovery room in stable condition.

## 2016-10-23 NOTE — Anesthesia Postprocedure Evaluation (Signed)
Anesthesia Post Note  Patient: Kristine [name]  Procedure(s) Performed: Procedure(s) (LRB): CYSTOSCOPY (N/A) BOTOX INJECTION (N/A)  Patient location during evaluation: PACU Anesthesia Type: General Level of consciousness: awake and alert Pain management: pain level controlled Vital Signs Assessment: post-procedure vital signs reviewed and stable Respiratory status: spontaneous breathing, nonlabored ventilation, respiratory function stable and patient connected to nasal cannula oxygen Cardiovascular status: blood pressure returned to baseline and stable Postop Assessment: no signs of nausea or vomiting Anesthetic complications: no    Last Vitals:  Vitals:   10/23/16 1518 10/23/16 1612  BP: (!) 157/81 132/76  Pulse: 90 88  Resp: 16 16  Temp: 37.1 C     Last Pain:  Vitals:   10/23/16 1612  TempSrc:   PainSc: [place]

## 2016-10-23 NOTE — H&P (Signed)
Date of Initial H&P: 10/11/16  History reviewed, patient examined, no change in status, stable for surgery.

## 2016-10-23 NOTE — Transfer of Care (Signed)
Immediate Anesthesia Transfer of Care Note  Patient: Kristine [name]  Procedure(s) Performed: Procedure(s): CYSTOSCOPY (N/A) BOTOX INJECTION (N/A)  Patient Location: PACU  Anesthesia Type:General  Level of Consciousness: awake, alert , oriented and patient cooperative  Airway & Oxygen Therapy: Patient Spontanous Breathing and Patient connected to nasal cannula oxygen  Post-op Assessment: Report given to RN and Post -op Vital signs reviewed and stable  Post vital signs: Reviewed and stable  Last Vitals:  Vitals:   10/23/16 1232 10/23/16 1425  BP: (!) 116/57 (!) 143/97  Pulse: 86 (!) 101  Resp: 16 10  Temp: 36.9 C 36.9 C    Last Pain:  Vitals:   10/23/16 1232  TempSrc: Oral  PainSc: 10-Worst pain ever         Complications: No apparent anesthesia complications

## 2016-10-23 NOTE — Anesthesia Preprocedure Evaluation (Signed)
Anesthesia Evaluation  Patient identified by MRN, date of birth, ID band Patient awake    Reviewed: Allergy & Precautions, NPO status , Patient's Chart, lab work & pertinent test results, reviewed documented beta blocker date and time   Airway Mallampati: III  TM Distance: >3 FB     Dental  (+) Chipped   Pulmonary asthma , former smoker,           Cardiovascular + Peripheral Vascular Disease       Neuro/Psych  Headaches, PSYCHIATRIC DISORDERS Anxiety Depression    GI/Hepatic GERD  Controlled,  Endo/Other    Renal/GU      Musculoskeletal   Abdominal   Peds  Hematology   Anesthesia Other Findings   Reproductive/Obstetrics                             Anesthesia Physical Anesthesia Plan  ASA: III  Anesthesia Plan: General   Post-op Pain Management:    Induction: Intravenous  Airway Management Planned: LMA  Additional Equipment:   Intra-op Plan:   Post-operative Plan:   Informed Consent: I have reviewed the patients History and Physical, chart, labs and discussed the procedure including the risks, benefits and alternatives for the proposed anesthesia with the patient or authorized representative who has indicated his/her understanding and acceptance.     Plan Discussed with: CRNA  Anesthesia Plan Comments:         Anesthesia Quick Evaluation

## 2016-10-23 NOTE — OR Nursing (Signed)
[place] Dr. Yves Dill to inquire if patient needs to void before dc home.  He said no.  Patient instructed to call if unable to urinate in 6-8 hours.  Dr. Yves Dill spoke to husband about the surgery findings.

## 2016-10-23 NOTE — Anesthesia Procedure Notes (Signed)
Procedure Name: LMA Insertion Date/Time: 10/23/2016 1:40 PM Performed by: Darlyne Russian Pre-anesthesia Checklist: Patient identified, Emergency Drugs available, Suction available, Patient being monitored and Timeout performed Patient Re-evaluated:Patient Re-evaluated prior to inductionOxygen Delivery Method: Circle system utilized Preoxygenation: Pre-oxygenation with 100% oxygen Intubation Type: IV induction Ventilation: Mask ventilation with difficulty LMA: LMA inserted LMA Size: 4.0 Number of attempts: 1 Placement Confirmation: positive ETCO2 and breath sounds checked- equal and bilateral Tube secured with: Tape Dental Injury: Teeth and Oropharynx as per pre-operative assessment

## 2016-10-24 ENCOUNTER — Encounter: Payer: Self-pay | Admitting: Urology

## 2016-11-06 ENCOUNTER — Emergency Department
Admission: EM | Admit: 2016-11-06 | Discharge: 2016-11-06 | Disposition: A | Discharge status: Home / Self Care | Payer: 59 | Attending: Emergency Medicine | Admitting: Emergency Medicine

## 2016-11-06 ENCOUNTER — Emergency Department: Payer: 59

## 2016-11-06 ENCOUNTER — Encounter: Payer: Self-pay | Admitting: Emergency Medicine

## 2016-11-06 DIAGNOSIS — I82611 Acute embolism and thrombosis of superficial veins of right upper extremity: Secondary | ICD-10-CM | POA: Insufficient documentation

## 2016-11-06 DIAGNOSIS — Z87891 Personal history of nicotine dependence: Secondary | ICD-10-CM | POA: Insufficient documentation

## 2016-11-06 DIAGNOSIS — J45909 Unspecified asthma, uncomplicated: Secondary | ICD-10-CM | POA: Diagnosis not present

## 2016-11-06 DIAGNOSIS — M79602 Pain in left arm: Secondary | ICD-10-CM | POA: Diagnosis present

## 2016-11-06 DIAGNOSIS — Z79899 Other long term (current) drug therapy: Secondary | ICD-10-CM | POA: Insufficient documentation

## 2016-11-06 DIAGNOSIS — I809 Phlebitis and thrombophlebitis of unspecified site: Secondary | ICD-10-CM

## 2016-11-06 NOTE — ED Notes (Signed)
Patient c/o left arm pain beginning 2 weeks ago. Pt reports she was diagnosed with cellulitis and urgent care and given clindamycin. Pt reports urgent care provider was concerned for possibility of blood clot. Pt reports she previously had significant swelling in the arm which has now resolved

## 2016-11-06 NOTE — ED Triage Notes (Signed)
Patient presents to the ED with left arm pain x 2 weeks post bladder surgery.  Patient reports she had an IV in her left arm and that is where she has had the pain.  Patient states she went to Urgent Care and was started on antibiotics for cellulitis approx. 10 days ago and she was started on clindamycin and patient denies any improvement.  The back of patient's left arm appears reddened.  Patient is in no obvious distress at this time.

## 2016-11-06 NOTE — ED Provider Notes (Signed)
[facility] Emergency Department Provider Note        Time seen: ----------------------------------------- 8:37 PM on 11/06/2016 -----------------------------------------    I have reviewed the triage vital signs and the nursing notes.   HISTORY  Chief Complaint Arm Pain    HPI Kristine [name] is a 45 y.o. female who presents to ER for left arm pain for 2 weeks after bladder surgery. Patient reports she had an IV in the left arm and that's where she had the pain. She went to an urgent care and was started on antibiotics for possible cellulitis. 10 days ago she was placed on antibiotics and thinks that her swelling has gone down some but not completely.   Past Medical History:  Diagnosis Date  . Abnormal weight gain 08/05/2013  . Absence of interventricular septum 08/05/2013  . Allergic rhinitis 04/17/2016  . Allergic urticaria due to ingested food 02/09/2016  . Anxiety   . Asthma   . Clinical depression 09/30/2013  . Complication of anesthesia    PT HAD TROUBLE BREATHING AFTER HYSTERECTOMY IN OCTOBER  . Depression   . Dizziness   . Excess weight 09/30/2013  . GERD (gastroesophageal reflux disease)   . Headache, migraine 09/30/2013  . Heart murmur   . Heavy drinker 04/11/2014   Overview:  Patient reports drinking 2-3 bottles of wine each weekend.    . History of methicillin resistant staphylococcus aureus (MRSA) 11/2015  . Infection of urinary tract 09/30/2013  . Irregular bleeding 09/30/2013  . Multiple allergies     Patient Active Problem List   Diagnosis Date Noted  . Menorrhagia with irregular cycle 09/17/2016  . Chronic idiopathic urticaria 09/03/2016  . Postoperative state 08/20/2016  . On prednisone therapy 06/20/2016  . Allergic rhinitis 04/17/2016  . Allergic urticaria due to ingested food 02/09/2016  . Heavy drinker 04/11/2014  . Depression 09/30/2013  . Irregular bleeding 09/30/2013  . Migraine headache 09/30/2013  . Excess  weight 09/30/2013  . Frequent UTI 09/30/2013  . Infection of urinary tract 09/30/2013  . VSD (ventricular septal defect and aortic arch hypoplasia 08/05/2013  . Abnormal weight gain 08/05/2013    Past Surgical History:  Procedure Laterality Date  . BOTOX INJECTION N/A 10/23/2016   Procedure: BOTOX INJECTION;  Surgeon: Royston Cowper, MD;  Location: [facility] ORS;  Service: Urology;  Laterality: N/A;  . BREAST BIOPSY Left 11/13/13   benign  . CESAREAN SECTION    . COLONOSCOPY    . CORONARY ANGIOPLASTY    . CYSTOSCOPY N/A 10/23/2016   Procedure: CYSTOSCOPY;  Surgeon: Royston Cowper, MD;  Location: [facility] ORS;  Service: Urology;  Laterality: N/A;  . CYSTOSCOPY WITH HYDRODISTENSION AND BIOPSY    . LAPAROSCOPIC ASSISTED VAGINAL HYSTERECTOMY N/A 08/20/2016   Procedure: LAPAROSCOPIC ASSISTED VAGINAL HYSTERECTOMY;  Surgeon: Boykin Nearing, MD;  Location: [facility] ORS;  Service: Gynecology;  Laterality: N/A;  . LAPAROSCOPIC BILATERAL SALPINGECTOMY Bilateral 08/20/2016   Procedure: LAPAROSCOPIC BILATERAL SALPINGECTOMY;  Surgeon: Boykin Nearing, MD;  Location: [facility] ORS;  Service: Gynecology;  Laterality: Bilateral;  . NASAL SINUS SURGERY     x12    Allergies Bee pollen; Bee venom; Eggs or egg-derived products; Keflex [cephalexin]; Lactase; Milk-related compounds; Penicillins; Shellfish allergy; Soy allergy; Wheat bran; Codeine; Ferrous gluconate; Hydrocodone; Iron; Iodine; and Latex  Social History Social History  Substance Use Topics  . Smoking status: Former Research scientist (life sciences)  . Smokeless tobacco: Never Used     Comment: quit 2008  . Alcohol use No  Review of Systems Constitutional: Negative for fever. Cardiovascular: Negative for chest pain. Respiratory: Negative for shortness of breath. Gastrointestinal: Negative for abdominal pain, vomiting and diarrhea. Musculoskeletal: Positive for left arm pain, swelling Skin: Positive for left arm erythema Neurological: Negative for headaches,  focal weakness or numbness.  10-point ROS otherwise negative.  ____________________________________________   PHYSICAL EXAM:  VITAL SIGNS: ED Triage Vitals  Enc Vitals Group     BP 11/06/16 1859 (!) 138/56     Pulse Rate 11/06/16 1859 86     Resp 11/06/16 1859 18     Temp 11/06/16 1859 98.3 F (36.8 C)     Temp Source 11/06/16 1859 Oral     SpO2 11/06/16 1859 97 %     Weight 11/06/16 1859 176 lb (79.8 kg)     Height 11/06/16 1859 5\' 3"  (1.6 m)     Head Circumference --      Peak Flow --      Pain Score 11/06/16 2017 0     Pain Loc --      Pain Edu? --      Excl. in [place]? --     Constitutional: Alert and oriented. Well appearing and in no distress. Musculoskeletal: Tenderness located over the left forearm diffusely. Mild erythema and swelling is noted to the elbow. Neurologic:  Normal speech and language. No gross focal neurologic deficits are appreciated.  Skin:  Skin is warm, dry and intact. Mild left forearm erythema Psychiatric: Mood and affect are normal. Speech and behavior are normal.  ____________________________________________  ED COURSE:  Pertinent labs & imaging results that were available during my care of the patient were reviewed by me and considered in my medical decision making (see chart for details). Clinical Course   Patient's no distress, we will assess with ultrasound.  Procedures RADIOLOGY  Left upper extremity ultrasound IMPRESSION: Occlusive thrombus of the left basilic vein beginning in the distal upper arm and continuing into the forearm to site of IV involving the dorsum of the hand. Critical Value/emergent results were called by telephone at the time of interpretation on 11/06/2016 at 8:51 pm to Dr. Jimmye Norman, who verbally acknowledged these results. ____________________________________________  FINAL ASSESSMENT AND PLAN  Phlebitis  Plan: Patient with imaging as dictated above. Patient was superficial thrombosis in the left upper  extremity. She has no other symptoms. Advise warm compresses, NSAIDs or aspirin and follow up as needed.   Earleen Newport, MD   Note: This dictation was prepared with Dragon dictation. Any transcriptional errors that result from this process are unintentional    Earleen Newport, MD 11/06/16 2120

## 2016-11-06 NOTE — ED Notes (Signed)

## 2017-04-09 ENCOUNTER — Emergency Department: Payer: 59

## 2017-04-09 ENCOUNTER — Encounter: Payer: Self-pay | Admitting: Emergency Medicine

## 2017-04-09 ENCOUNTER — Emergency Department
Admission: EM | Admit: 2017-04-09 | Discharge: 2017-04-09 | Disposition: A | Discharge status: Home / Self Care | Payer: 59 | Attending: Student in an Organized Health Care Education/Training Program | Admitting: Student in an Organized Health Care Education/Training Program

## 2017-04-09 DIAGNOSIS — Z7951 Long term (current) use of inhaled steroids: Secondary | ICD-10-CM | POA: Insufficient documentation

## 2017-04-09 DIAGNOSIS — Z79899 Other long term (current) drug therapy: Secondary | ICD-10-CM | POA: Insufficient documentation

## 2017-04-09 DIAGNOSIS — J45909 Unspecified asthma, uncomplicated: Secondary | ICD-10-CM | POA: Insufficient documentation

## 2017-04-09 DIAGNOSIS — Z87891 Personal history of nicotine dependence: Secondary | ICD-10-CM | POA: Diagnosis not present

## 2017-04-09 DIAGNOSIS — R05 Cough: Secondary | ICD-10-CM | POA: Diagnosis not present

## 2017-04-09 DIAGNOSIS — R0602 Shortness of breath: Secondary | ICD-10-CM | POA: Insufficient documentation

## 2017-04-09 DIAGNOSIS — R059 Cough, unspecified: Secondary | ICD-10-CM

## 2017-04-09 DIAGNOSIS — R079 Chest pain, unspecified: Secondary | ICD-10-CM | POA: Diagnosis not present

## 2017-04-09 LAB — BASIC METABOLIC PANEL
Anion gap: 7 (ref 5–15)
BUN: 12 mg/dL (ref 6–20)
CHLORIDE: 103 mmol/L (ref 101–111)
CO2: 29 mmol/L (ref 22–32)
CREATININE: 0.69 mg/dL (ref 0.44–1.00)
Calcium: 9 mg/dL (ref 8.9–10.3)
GFR calc Af Amer: >60 mL/min (ref >60)
GFR calc non Af Amer: >60 mL/min (ref >60)
GLUCOSE: 103 mg/dL — AB (ref 65–99)
Potassium: 3.6 mmol/L (ref 3.5–5.1)
Sodium: 139 mmol/L (ref 135–145)

## 2017-04-09 LAB — CBC
HCT: 40.3 % (ref 35.0–47.0)
Hemoglobin: 13.8 g/dL (ref 12.0–16.0)
MCH: 28.7 pg (ref 26.0–34.0)
MCHC: 34.2 g/dL (ref 32.0–36.0)
MCV: 83.9 fL (ref 80.0–100.0)
PLATELETS: 279 10*3/uL (ref 150–440)
RBC: 4.8 MIL/uL (ref 3.80–5.20)
RDW: 13 % (ref 11.5–14.5)
WBC: 5.4 10*3/uL (ref 3.6–11.0)

## 2017-04-09 LAB — FIBRIN DERIVATIVES D-DIMER (ARMC ONLY): Fibrin derivatives D-dimer (ARMC): 694.86 — ABNORMAL HIGH (ref 0.00–499.00)

## 2017-04-09 LAB — TROPONIN I: Troponin I: <0.03 ng/mL (ref <0.03)

## 2017-04-09 LAB — MAGNESIUM: Magnesium: 2.1 mg/dL (ref 1.7–2.4)

## 2017-04-09 MED ORDER — IOPAMIDOL (ISOVUE-370) INJECTION 76%
75.0000 mL | Freq: Once | INTRAVENOUS | Status: AC | PRN
  Administered 2017-04-09: 75 mL via INTRAVENOUS

## 2017-04-09 MED ORDER — IPRATROPIUM-ALBUTEROL 0.5-2.5 (3) MG/3ML IN SOLN
3.0000 mL | Freq: Once | RESPIRATORY_TRACT | Status: AC
  Administered 2017-04-09: 3 mL via RESPIRATORY_TRACT
  Filled 2017-04-09: qty 3

## 2017-04-09 MED ORDER — IPRATROPIUM-ALBUTEROL 0.5-2.5 (3) MG/3ML IN SOLN
3.0000 mL | Freq: Once | RESPIRATORY_TRACT | Status: AC
Start: 2017-04-09 — End: 2017-04-09
  Administered 2017-04-09: 3 mL via RESPIRATORY_TRACT
  Filled 2017-04-09: qty 3

## 2017-04-09 MED ORDER — DEXAMETHASONE SODIUM PHOSPHATE 10 MG/ML IJ SOLN
10.0000 mg | Freq: Once | INTRAMUSCULAR | Status: AC
  Administered 2017-04-09: 10 mg via INTRAVENOUS
  Filled 2017-04-09: qty 1

## 2017-04-09 MED ORDER — ALBUTEROL SULFATE (2.5 MG/3ML) 0.083% IN NEBU
5.0000 mg | INHALATION_SOLUTION | Freq: Once | RESPIRATORY_TRACT | Status: AC
  Administered 2017-04-09: 5 mg via RESPIRATORY_TRACT
  Filled 2017-04-09: qty 6

## 2017-04-09 MED ORDER — ALBUTEROL SULFATE (2.5 MG/3ML) 0.083% IN NEBU: 2.5000 mg | INHALATION_SOLUTION | Freq: Once | RESPIRATORY_TRACT | Status: DC

## 2017-04-09 MED ORDER — HYDROXYZINE HCL 25 MG PO TABS
50.0000 mg | ORAL_TABLET | Freq: Once | ORAL | Status: AC
  Administered 2017-04-09: 50 mg via ORAL
  Filled 2017-04-09: qty 2

## 2017-04-09 MED ORDER — AEROCHAMBER MV MISC: 0 refills | Status: DC

## 2017-04-09 MED ORDER — ALBUTEROL SULFATE HFA 108 (90 BASE) MCG/ACT IN AERS: 2.0000 | INHALATION_SPRAY | Freq: Four times a day (QID) | RESPIRATORY_TRACT | 2 refills | Status: AC | PRN

## 2017-04-09 MED ORDER — DIPHENHYDRAMINE HCL 50 MG/ML IJ SOLN
25.0000 mg | Freq: Once | INTRAMUSCULAR | Status: AC
  Administered 2017-04-09: 25 mg via INTRAVENOUS
  Filled 2017-04-09: qty 1

## 2017-04-09 MED ORDER — ALBUTEROL SULFATE HFA 108 (90 BASE) MCG/ACT IN AERS: 2.0000 | INHALATION_SPRAY | Freq: Once | RESPIRATORY_TRACT | Status: DC

## 2017-04-09 MED ORDER — SODIUM CHLORIDE 0.9 % IV BOLUS (SEPSIS)
1000.0000 mL | Freq: Once | INTRAVENOUS | Status: AC
  Administered 2017-04-09: 1000 mL via INTRAVENOUS

## 2017-04-09 MED ORDER — HYDROXYZINE HCL 25 MG PO TABS
ORAL_TABLET | ORAL | Status: AC
  Administered 2017-04-09: 50 mg via ORAL
  Filled 2017-04-09: qty 1

## 2017-04-09 MED ORDER — AEROCHAMBER PLUS W/MASK MISC: 1.0000 | Freq: Once | Status: DC

## 2017-04-09 NOTE — ED Provider Notes (Signed)
[facility] Emergency Department Provider Note    First MD Initiated Contact with Patient 04/09/17 1729     (approximate)  I have reviewed the triage vital signs and the nursing notes.   HISTORY  Chief Complaint Shortness of Breath    HPI Kristine [name] is a 46 y.o. female presents with several days of persistent cough and shortness of breath with pleuritic chest pain. Patient just flew back from [place] on Tuesday of last week. States that at that point she was having sore throat and over the weekend certain having worsening cough and shortness of breath. She would urgent care on Saturday and was started on azithromycin and doxycycline and given Tessalon Perles since then she is still having fever to 103.No history of DVT. She does not smoke. No history of heart failure but does take HCTZ for blood pressure.   Past Medical History:  Diagnosis Date  . Abnormal weight gain 08/05/2013  . Absence of interventricular septum 08/05/2013  . Allergic rhinitis 04/17/2016  . Allergic urticaria due to ingested food 02/09/2016  . Anxiety   . Asthma   . Clinical depression 09/30/2013  . Complication of anesthesia    PT HAD TROUBLE BREATHING AFTER HYSTERECTOMY IN OCTOBER  . Depression   . Dizziness   . Excess weight 09/30/2013  . GERD (gastroesophageal reflux disease)   . Headache, migraine 09/30/2013  . Heart murmur   . Heavy drinker 04/11/2014   Overview:  Patient reports drinking 2-3 bottles of wine each weekend.    . History of methicillin resistant staphylococcus aureus (MRSA) 11/2015  . Infection of urinary tract 09/30/2013  . Irregular bleeding 09/30/2013  . Multiple allergies    Family History  Problem Relation Age of Onset  . Breast cancer Maternal Aunt   . Breast cancer Paternal Aunt   . Breast cancer Maternal Grandmother   . Breast cancer Paternal Grandmother   . Breast cancer Paternal Aunt   . Kidney cancer Maternal Uncle   . Prostate cancer  Paternal Grandfather   . Kidney disease Unknown        father's side  . Aneurysm Mother   . Liver disease Mother   . Cancer Father    Past Surgical History:  Procedure Laterality Date  . BOTOX INJECTION N/A 10/23/2016   Procedure: BOTOX INJECTION;  Surgeon: Royston Cowper, MD;  Location: [facility] ORS;  Service: Urology;  Laterality: N/A;  . BREAST BIOPSY Left 11/13/13   benign  . CESAREAN SECTION    . COLONOSCOPY    . CORONARY ANGIOPLASTY    . CYSTOSCOPY N/A 10/23/2016   Procedure: CYSTOSCOPY;  Surgeon: Royston Cowper, MD;  Location: [facility] OR[facility];  Service: Urology;  Laterality: N/A;  . CYSTOSCOPY WITH HYDRODISTENSION AND BIOPSY    . LAPAROSCOPIC ASSISTED VAGINAL HYSTERECTOMY N/A 08/20/2016   Procedure: LAPAROSCOPIC ASSISTED VAGINAL HYSTERECTOMY;  Surgeon: Boykin Nearing, MD;  Location: [facility] ORS;  Service: Gynecology;  Laterality: N/A;  . LAPAROSCOPIC BILATERAL SALPINGECTOMY Bilateral 08/20/2016   Procedure: LAPAROSCOPIC BILATERAL SALPINGECTOMY;  Surgeon: Boykin Nearing, MD;  Location: [facility] ORS;  Service: Gynecology;  Laterality: Bilateral;  . NASAL SINUS SURGERY     x12   Patient Active Problem List   Diagnosis Date Noted  . Menorrhagia with irregular cycle 09/17/2016  . Chronic idiopathic urticaria 09/03/2016  . Postoperative state 08/20/2016  . On prednisone therapy 06/20/2016  . Allergic rhinitis 04/17/2016  . Allergic urticaria due to ingested food 02/09/2016  .  Heavy drinker 04/11/2014  . Depression 09/30/2013  . Irregular bleeding 09/30/2013  . Migraine headache 09/30/2013  . Excess weight 09/30/2013  . Frequent UTI 09/30/2013  . Infection of urinary tract 09/30/2013  . VSD (ventricular septal defect and aortic arch hypoplasia 08/05/2013  . Abnormal weight gain 08/05/2013      Prior to Admission medications   Medication Sig Start Date End Date Taking? Authorizing Provider  albuterol (VENTOLIN HFA) 108 (90 Base) MCG/ACT inhaler Inhale 2 puffs into the lungs  every 4 (four) hours as needed for wheezing or shortness of breath.  09/24/12  Yes [provider]  azelastine (OPTIVAR) 0.05 % ophthalmic solution Place 1 drop into both eyes daily.   Yes [provider]  Azelastine HCl 0.15 % SOLN Place 2 sprays into the nose 2 (two) times daily.    Yes [provider]  azithromycin (ZITHROMAX) 250 MG tablet Take by mouth daily.   Yes [provider]  B Complex Vitamins (VITAMIN B COMPLEX PO) Take 1 tablet by mouth daily.   Yes [provider]  benzonatate (TESSALON) 200 MG capsule Take 200 mg by mouth 3 (three) times daily as needed. 04/07/17 04/14/17 Yes [provider]  Biotin 10000 MCG TABS Take 10,000 mg by mouth daily.   Yes [provider]  cetirizine (ZYRTEC) 10 MG tablet Take 2 tablets by mouth 2 (two) times daily.    Yes [provider]  Cholecalciferol (VITAMIN D-1000 MAX ST) 1000 units tablet Take 1 tablet by mouth 2 (two) times daily.  11/19/14  Yes [provider]  CRANBERRY PO Take 1 capsule by mouth at bedtime.    Yes [provider]  doxycycline (VIBRAMYCIN) 100 MG capsule Take 100 mg by mouth 2 (two) times daily. 04/09/17 04/14/17 Yes [provider]  EPINEPHrine 0.3 mg/0.3 mL IJ SOAJ injection Inject 0.3 mLs (0.3 mg total) into the muscle once. 02/13/16  Yes Nance Pear, MD  fluticasone [facility]) 50 MCG/ACT nasal spray Place 1 spray into both nostrils 2 (two) times daily.    Yes [provider]  Fluticasone-Salmeterol (ADVAIR) 250-50 MCG/DOSE AEPB Inhale 1 puff into the lungs 2 (two) times daily.   Yes [provider]  fosfomycin (MONUROL) 3 g PACK Take 3 g by mouth as directed. Take 3g on day 1, then take another 3g three days later, then another 3g 3 days after that   Yes [provider]  hydrochlorothiazide (HYDRODIURIL) 25 MG tablet Take 25 mg by mouth daily.   Yes [provider]  hydrOXYzine (ATARAX/VISTARIL) 25  MG tablet Take 1 tablet (25 mg total) by mouth 3 (three) times daily as needed. Patient taking differently: Take 50 mg by mouth 4 (four) times daily as needed for itching.  09/23/16  Yes Loney Hering, MD  loratadine (CLARITIN) 10 MG tablet Take 20 mg by mouth daily.   Yes [provider]  magnesium oxide (MAG-OX) 400 MG tablet Take 400 mg by mouth daily.   Yes [provider]  montelukast (SINGULAIR) 10 MG tablet Take 10 mg by mouth at bedtime.    Yes [provider]  Multiple Vitamins-Minerals (EMERGEN-C FIVE PO) Take 1 capsule by mouth daily.   Yes [provider]  Multiple Vitamins-Minerals (MULTI-VITAMIN GUMMIES PO) Take 2 tablets by mouth daily.    Yes [provider]  Multiple Vitamins-Minerals (ZINC PO) Take 1 tablet by mouth daily.   Yes [provider]  Omalizumab Arvid Right [place]) Inject 1 Dose into  the skin every 30 (thirty) days.   Yes [provider]  Potassium 95 MG TABS Take 1 tablet by mouth daily.   Yes [provider]  ranitidine (ZANTAC) 150 MG tablet Take 150 mg by mouth as directed. Takes 1 in the morning 1 before each meal and 1 before bedtime   Yes [provider]  vitamin B-12 (CYANOCOBALAMIN) 1000 MCG tablet Take 1,000 mcg by mouth daily.   Yes [provider]  vitamin C (ASCORBIC ACID) 500 MG tablet Take 500 mg by mouth at bedtime.   Yes [provider]  albuterol (PROVENTIL HFA;VENTOLIN HFA) 108 (90 Base) MCG/ACT inhaler Inhale 2 puffs into the lungs every 6 (six) hours as needed for wheezing or shortness of breath. 04/09/17   Merlyn Lot, MD  Spacer/Aero-Holding Chambers (AEROCHAMBER MV) inhaler Use as instructed 04/09/17   Merlyn Lot, MD    Allergies Bee pollen; Bee venom; Eggs or egg-derived products; Keflex [cephalexin]; Lactase; Milk-related compounds; Penicillins; Shellfish allergy; Soy allergy; Wheat bran; Codeine; Ferrous gluconate; Hydrocodone; Iron;  Iodine; and Latex    Social History Social History  Substance Use Topics  . Smoking status: Former Research scientist (life sciences)  . Smokeless tobacco: Never Used     Comment: quit 2008  . Alcohol use No    Review of Systems Patient denies headaches, rhinorrhea, blurry vision, numbness, shortness of breath, chest pain, edema, cough, abdominal pain, nausea, vomiting, diarrhea, dysuria, fevers, rashes or hallucinations unless otherwise stated above in HPI. ____________________________________________   PHYSICAL EXAM:  VITAL SIGNS: Vitals:   04/09/17 1830 04/09/17 1915  BP:  113/60  Pulse: 77 81  Resp: (!) 21 18  Temp:      Constitutional: Alert and oriented. Well appearing and in no acute distress. Eyes: Conjunctivae are normal.  Head: Atraumatic. Nose: No congestion/rhinnorhea. Mouth/Throat: Mucous membranes are moist.   Neck: No stridor. Painless ROM.  Cardiovascular: Normal rate, regular rhythm. Grossly normal heart sounds.  Good peripheral circulation. Respiratory: Normal respiratory effort.  No retractions. Lungs with diffuse coarse rhonchorus breathsounds Gastrointestinal: Soft and nontender. No distention. No abdominal bruits. No CVA tenderness. Musculoskeletal: No lower extremity tenderness nor edema.  No joint effusions. Neurologic:  Normal speech and language. No gross focal neurologic deficits are appreciated. No facial droop Skin:  Skin is warm, dry and intact. No rash noted. Psychiatric: Mood and affect are normal. Speech and behavior are normal.  ____________________________________________   LABS (all labs ordered are listed, but only abnormal results are displayed)  Results for orders placed or performed during the hospital encounter of 04/09/17 (from the past 24 hour(s))  Basic metabolic panel     Status: Abnormal   Collection Time: 04/09/17  3:52 PM  Result Value Ref Range   Sodium 139 135 - 145 mmol/L   Potassium 3.6 3.5 - 5.1 mmol/L   Chloride 103 101 - 111 mmol/L    CO2 29 22 - 32 mmol/L   Glucose, Bld 103 (H) 65 - 99 mg/dL   BUN 12 6 - 20 mg/dL   Creatinine, Ser 0.69 0.44 - 1.00 mg/dL   Calcium 9.0 8.9 - 10.3 mg/dL   GFR calc non Af Amer >60 >60 mL/min   GFR calc Af Amer >60 >60 mL/min   Anion gap 7 5 - 15  CBC     Status: None   Collection Time: 04/09/17  3:52 PM  Result Value Ref Range   WBC 5.4 3.6 - 11.0 K/uL   RBC 4.80 3.80 - 5.20 MIL/uL  Hemoglobin 13.8 12.0 - 16.0 g/dL   HCT 40.3 35.0 - 47.0 %   MCV 83.9 80.0 - 100.0 fL   MCH 28.7 26.0 - 34.0 pg   MCHC 34.2 32.0 - 36.0 g/dL   RDW 13.0 11.5 - 14.5 %   Platelets 279 150 - 440 K/uL  Troponin I     Status: None   Collection Time: 04/09/17  3:52 PM  Result Value Ref Range   Troponin I <0.03 <0.03 ng/mL  Fibrin derivatives D-Dimer ([facility] only)     Status: Abnormal   Collection Time: 04/09/17  3:52 PM  Result Value Ref Range   Fibrin derivatives D-dimer ([facility]) 694.86 (H) 0.00 - 499.00  Magnesium     Status: None   Collection Time: 04/09/17  3:52 PM  Result Value Ref Range   Magnesium 2.1 1.7 - 2.4 mg/dL   ____________________________________________  EKG My review and personal interpretation at Time: 15:51   Indication: sob  Rate: 80  Rhythm: sinus Axis: normal Other: non specific changes, difficult to interpret 2/2 respiratory baseline wander ____________________________________________  RADIOLOGY  I personally reviewed all radiographic images ordered to evaluate for the above acute complaints and reviewed radiology reports and findings.  These findings were personally discussed with the patient.  Please see medical record for radiology report.  ____________________________________________   PROCEDURES  Procedure(s) performed:  Procedures    Critical Care performed: no ____________________________________________   INITIAL IMPRESSION / ASSESSMENT AND PLAN / ED COURSE  Pertinent labs & imaging results that were available during my care of the patient were reviewed by  me and considered in my medical decision making (see chart for details).  DDX: Asthma, copd, CHF, pna, ptx, malignancy, Pe, anemia   Deolinda Frid Ruggiero is a 46 y.o. who presents to the ED with shortness of breath cough and chest pain as described above. Blood work is fairly reassuring. D-dimer ordered out of triage and given recent prolonged travel is elevated. CT imaging ordered to evaluate for any evidence of pulmonary embolism or pneumonia. She is afebrile and her symptoms seem to be more related to bronchitis which is further supported by a negative CT. Patient had significant improvement with albuterol inhalers. She did receive a dose of Decadron with continued improvement. EKG shows no evidence of acute ischemia and her troponin is negative. This is not consistent with ACS but more pulmonary disease pattern. Patient stable for follow-up with PCP.      ____________________________________________   FINAL CLINICAL IMPRESSION(S) / ED DIAGNOSES  Final diagnoses:  SOB (shortness of breath)  Cough      NEW MEDICATIONS STARTED DURING THIS VISIT:  New Prescriptions   ALBUTEROL (PROVENTIL HFA;VENTOLIN HFA) 108 (90 BASE) MCG/ACT INHALER    Inhale 2 puffs into the lungs every 6 (six) hours as needed for wheezing or shortness of breath.   SPACER/AERO-HOLDING CHAMBERS (AEROCHAMBER MV) INHALER    Use as instructed     Note:  This document was prepared using Dragon voice recognition software and may include unintentional dictation errors.    Merlyn Lot, MD 04/09/17 2017

## 2017-04-09 NOTE — ED Notes (Signed)
Pharmacy called for albuterol inhaler & aero chamber.

## 2017-04-09 NOTE — ED Notes (Addendum)
Pt assisted to ambulating to the bathroom in hall. Pt ambulated independently with a steady gait while maintaining on oxygenation above 95%.

## 2017-04-09 NOTE — Discharge Instructions (Signed)
Continue antibiotics as directed.  Return to the ER should you develop any worsening of shortness of breath or chest pain.  Continue to take motrin and tylenol for fevers.  Take albuterol every 4 hours while awake.

## 2017-04-09 NOTE — ED Triage Notes (Signed)
Pt here for [facility] and chest pain.  Pain worse with breathing but on both inspiration and expiration.  Just flew back from [place]. Pt is not smoker or on birth control. Not labored in triage. Ambulatory to triage without difficulty. No fevers. Temp 103 per pt this AM.   D dimer sent per dr Cinda Quest

## 2017-04-09 NOTE — ED Notes (Signed)
Pt resting in bed, family at bedside, awake and alert, family at bedside

## 2017-06-20 ENCOUNTER — Other Ambulatory Visit: Payer: Self-pay | Admitting: Physician Assistant

## 2017-06-20 DIAGNOSIS — Z1231 Encounter for screening mammogram for malignant neoplasm of breast: Secondary | ICD-10-CM

## 2017-07-03 DIAGNOSIS — R7989 Other specified abnormal findings of blood chemistry: Secondary | ICD-10-CM | POA: Insufficient documentation

## 2017-08-05 ENCOUNTER — Ambulatory Visit
Admission: RE | Admit: 2017-08-05 | Discharge: 2017-08-05 | Disposition: A | Discharge status: Home / Self Care | Payer: 59 | Source: Ambulatory Visit | Attending: Physician Assistant | Admitting: Physician Assistant

## 2017-08-05 DIAGNOSIS — Z1231 Encounter for screening mammogram for malignant neoplasm of breast: Secondary | ICD-10-CM

## 2017-09-19 ENCOUNTER — Other Ambulatory Visit: Payer: Self-pay

## 2017-09-19 ENCOUNTER — Emergency Department
Admission: EM | Admit: 2017-09-19 | Discharge: 2017-09-19 | Disposition: A | Discharge status: Home / Self Care | Payer: 59 | Attending: Emergency Medicine | Admitting: Emergency Medicine

## 2017-09-19 ENCOUNTER — Emergency Department: Payer: 59

## 2017-09-19 DIAGNOSIS — R079 Chest pain, unspecified: Secondary | ICD-10-CM | POA: Insufficient documentation

## 2017-09-19 DIAGNOSIS — J45909 Unspecified asthma, uncomplicated: Secondary | ICD-10-CM | POA: Insufficient documentation

## 2017-09-19 DIAGNOSIS — Z91040 Latex allergy status: Secondary | ICD-10-CM | POA: Insufficient documentation

## 2017-09-19 DIAGNOSIS — Z87891 Personal history of nicotine dependence: Secondary | ICD-10-CM | POA: Insufficient documentation

## 2017-09-19 LAB — BASIC METABOLIC PANEL
Anion gap: 12 (ref 5–15)
BUN: 17 mg/dL (ref 6–20)
CALCIUM: 9.1 mg/dL (ref 8.9–10.3)
CHLORIDE: 96 mmol/L — AB (ref 101–111)
CO2: 29 mmol/L (ref 22–32)
CREATININE: 0.85 mg/dL (ref 0.44–1.00)
GFR calc non Af Amer: >60 mL/min (ref >60)
GLUCOSE: 102 mg/dL — AB (ref 65–99)
Potassium: 3.2 mmol/L — ABNORMAL LOW (ref 3.5–5.1)
Sodium: 137 mmol/L (ref 135–145)

## 2017-09-19 LAB — CBC
HCT: 41.6 % (ref 35.0–47.0)
Hemoglobin: 14 g/dL (ref 12.0–16.0)
MCH: 28.5 pg (ref 26.0–34.0)
MCHC: 33.6 g/dL (ref 32.0–36.0)
MCV: 84.7 fL (ref 80.0–100.0)
PLATELETS: 352 10*3/uL (ref 150–440)
RBC: 4.91 MIL/uL (ref 3.80–5.20)
RDW: 13.9 % (ref 11.5–14.5)
WBC: 6.3 10*3/uL (ref 3.6–11.0)

## 2017-09-19 LAB — TROPONIN I
Troponin I: <0.03 ng/mL (ref <0.03)
Troponin I: <0.03 ng/mL (ref <0.03)

## 2017-09-19 LAB — TSH: TSH: 5.752 u[IU]/mL — ABNORMAL HIGH (ref 0.350–4.500)

## 2017-09-19 LAB — T4, FREE: FREE T4: 0.89 ng/dL (ref 0.61–1.12)

## 2017-09-19 NOTE — ED Provider Notes (Signed)
[facility] Emergency Department Provider Note       Time seen: ----------------------------------------- 8:03 PM on 09/19/2017 -----------------------------------------    I have reviewed the triage vital signs and the nursing notes.  HISTORY   Chief Complaint Chest Pain    HPI Kristine [name] is a 46 y.o. female with a history of migraines and depression who presents to the ED for central left-sided chest pain since yesterday.  Patient does report some nausea and shortness of breath but no vomiting.  She also describes being diaphoretic and states chest pain felt like a pressure.  Patient reports nothing makes the pain better or worse.  She has also been having diffuse body cramping that precedes this pain.  Pain is in a single area on her left chest wall  Past Medical History:  Diagnosis Date  . Abnormal weight gain 08/05/2013  . Absence of interventricular septum 08/05/2013  . Allergic rhinitis 04/17/2016  . Allergic urticaria due to ingested food 02/09/2016  . Anxiety   . Asthma   . Clinical depression 09/30/2013  . Complication of anesthesia    PT HAD TROUBLE BREATHING AFTER HYSTERECTOMY IN OCTOBER  . Depression   . Dizziness   . Excess weight 09/30/2013  . GERD (gastroesophageal reflux disease)   . Headache, migraine 09/30/2013  . Heart murmur   . Heavy drinker 04/11/2014   Overview:  Patient reports drinking 2-3 bottles of wine each weekend.    . History of methicillin resistant staphylococcus aureus (MRSA) 11/2015  . Infection of urinary tract 09/30/2013  . Irregular bleeding 09/30/2013  . Multiple allergies     Patient Active Problem List   Diagnosis Date Noted  . Menorrhagia with irregular cycle 09/17/2016  . Chronic idiopathic urticaria 09/03/2016  . Postoperative state 08/20/2016  . On prednisone therapy 06/20/2016  . Allergic rhinitis 04/17/2016  . Allergic urticaria due to ingested food 02/09/2016  . Heavy drinker 04/11/2014  .  Depression 09/30/2013  . Irregular bleeding 09/30/2013  . Migraine headache 09/30/2013  . Excess weight 09/30/2013  . Frequent UTI 09/30/2013  . Infection of urinary tract 09/30/2013  . VSD (ventricular septal defect and aortic arch hypoplasia 08/05/2013  . Abnormal weight gain 08/05/2013    Past Surgical History:  Procedure Laterality Date  . BREAST BIOPSY Left 11/13/13   benign  . CESAREAN SECTION    . COLONOSCOPY    . CORONARY ANGIOPLASTY    . CYSTOSCOPY WITH HYDRODISTENSION AND BIOPSY    . NASAL SINUS SURGERY     x12    Allergies Bee pollen; Bee venom; Eggs or egg-derived products; Keflex [cephalexin]; Lactase; Milk-related compounds; Penicillins; Shellfish allergy; Soy allergy; Wheat bran; Codeine; Ferrous gluconate; Hydrocodone; Iron; Iodine; and Latex  Social History Social History   Tobacco Use  . Smoking status: Former Research scientist (life sciences)  . Smokeless tobacco: Never Used  . Tobacco comment: quit 2008  Substance Use Topics  . Alcohol use: No    Alcohol/week: 0.0 oz  . Drug use: No    Review of Systems Constitutional: Negative for fever. ENT:  Negative for congestion, sore throat Cardiovascular: Positive for chest pain Respiratory: Negative for shortness of breath. Gastrointestinal: Negative for abdominal pain, vomiting and diarrhea. Genitourinary: Negative for dysuria. Musculoskeletal: Negative for back pain. Skin: Negative for rash. Neurological: Negative for headaches, focal weakness or numbness.  All systems negative/normal/unremarkable except as stated in the HPI  ____________________________________________   PHYSICAL EXAM:  VITAL SIGNS: ED Triage Vitals  Enc Vitals Group  BP 09/19/17 1959 (!) 111/51     Pulse Rate 09/19/17 1959 71     Resp 09/19/17 1959 17     Temp 09/19/17 1959 98 F (36.7 C)     Temp Source 09/19/17 1959 Oral     SpO2 09/19/17 1959 99 %     Weight 09/19/17 1916 170 lb (77.1 kg)     Height 09/19/17 1916 5\' 2"  (1.575 m)     Head  Circumference --      Peak Flow --      Pain Score 09/19/17 1915 5     Pain Loc --      Pain Edu? --      Excl. in [place]? --     Constitutional: Alert and oriented. Well appearing and in no distress. Eyes: Conjunctivae are normal. Normal extraocular movements. ENT   Head: Normocephalic and atraumatic.   Nose: No congestion/rhinnorhea.   Mouth/Throat: Mucous membranes are moist.   Neck: No stridor. Cardiovascular: Normal rate, regular rhythm. No murmurs, rubs, or gallops. Respiratory: Normal respiratory effort without tachypnea nor retractions. Breath sounds are clear and equal bilaterally. No wheezes/rales/rhonchi. Gastrointestinal: Soft and nontender. Normal bowel sounds Musculoskeletal: Nontender with normal range of motion in extremities. No lower extremity tenderness nor edema. Neurologic:  Normal speech and language. No gross focal neurologic deficits are appreciated.  Skin:  Skin is warm, dry and intact. No rash noted. Psychiatric: Mood and affect are normal. Speech and behavior are normal.  ____________________________________________  EKG: Interpreted by me.  Sinus rhythm the rate of 69 bpm, normal PR interval, normal QRS, normal QT.  Artifact is noted on the EKG  Repeat EKG reveals sinus rhythm with normal PR interval, normal QRS, normal QT. ____________________________________________  ED COURSE:  Pertinent labs & imaging results that were available during my care of the patient were reviewed by me and considered in my medical decision making (see chart for details). Patient presents for chest pain, we will assess with labs and imaging as indicated.   Procedures ____________________________________________   LABS (pertinent positives/negatives)  Labs Reviewed  BASIC METABOLIC PANEL - Abnormal; Notable for the following components:      Result Value   Potassium 3.2 (*)    Chloride 96 (*)    Glucose, Bld 102 (*)    All other components within normal  limits  TSH - Abnormal; Notable for the following components:   TSH 5.752 (*)    All other components within normal limits  CBC  TROPONIN I  TROPONIN I  T4, FREE    RADIOLOGY  Chest x-ray Was unremarkable ____________________________________________  DIFFERENTIAL DIAGNOSIS   Costochondritis, muscle strain, unstable angina, MI, PE, pneumothorax, pneumonia  FINAL ASSESSMENT AND PLAN  Chest pain   Plan: Patient had presented for chest pain of uncertain etiology. Patient's labs are reassuring including repeat troponin and thyroid panel. Patient's imaging was also negative.  Unclear etiology for her symptoms.  She is stable for outpatient follow-up with her doctor and she is low risk for ACS.   Earleen Newport, MD   Note: This note was generated in part or whole with voice recognition software. Voice recognition is usually quite accurate but there are transcription errors that can and very often do occur. I apologize for any typographical errors that were not detected and corrected.     Earleen Newport, MD 09/19/17 2107

## 2017-09-19 NOTE — ED Triage Notes (Addendum)
Pt presents to ED via POV with c/o central and left-sided CP since yesterday. Pt reports + nausea and SHOB, but no vomiting. Pt also describes being diaphoretic and says "my feet are sweating more than normal and smell horrible". Pt describes CP as "pressure" with radiation into her neck and left arm. Pt denies previous cardiac history except for SVD repair at age 46. Pt also reports recently starting new r/x for Gabapentin from pain management clinic. Pt is A&O, in NAD; RR even, regular, and unlabored; skin color/temp is WNL.

## 2017-09-19 NOTE — ED Notes (Signed)
Pt has left side chest pain since yesterday.  Pt reports weight gain of 20 pounds during past month.  No sob.  No n/v/d  nonradiaiting chest pain.  Pt alert.   Speech clear.

## 2017-09-23 DIAGNOSIS — R079 Chest pain, unspecified: Secondary | ICD-10-CM | POA: Insufficient documentation

## 2017-11-12 HISTORY — PX: BLEPHAROPLASTY: SUR158

## 2017-11-25 DIAGNOSIS — L501 Idiopathic urticaria: Secondary | ICD-10-CM | POA: Diagnosis not present

## 2017-11-28 DIAGNOSIS — L501 Idiopathic urticaria: Secondary | ICD-10-CM | POA: Diagnosis not present

## 2017-12-16 DIAGNOSIS — M722 Plantar fascial fibromatosis: Secondary | ICD-10-CM | POA: Diagnosis not present

## 2017-12-16 DIAGNOSIS — M7541 Impingement syndrome of right shoulder: Secondary | ICD-10-CM | POA: Diagnosis not present

## 2017-12-16 DIAGNOSIS — S43431D Superior glenoid labrum lesion of right shoulder, subsequent encounter: Secondary | ICD-10-CM | POA: Diagnosis not present

## 2017-12-16 DIAGNOSIS — M75111 Incomplete rotator cuff tear or rupture of right shoulder, not specified as traumatic: Secondary | ICD-10-CM | POA: Diagnosis not present

## 2017-12-24 DIAGNOSIS — T7800XD Anaphylactic reaction due to unspecified food, subsequent encounter: Secondary | ICD-10-CM | POA: Diagnosis not present

## 2017-12-24 DIAGNOSIS — L501 Idiopathic urticaria: Secondary | ICD-10-CM | POA: Diagnosis not present

## 2017-12-24 DIAGNOSIS — E039 Hypothyroidism, unspecified: Secondary | ICD-10-CM | POA: Diagnosis not present

## 2017-12-27 DIAGNOSIS — L501 Idiopathic urticaria: Secondary | ICD-10-CM | POA: Diagnosis not present

## 2017-12-30 DIAGNOSIS — R7989 Other specified abnormal findings of blood chemistry: Secondary | ICD-10-CM | POA: Diagnosis not present

## 2017-12-31 DIAGNOSIS — L501 Idiopathic urticaria: Secondary | ICD-10-CM | POA: Diagnosis not present

## 2017-12-31 DIAGNOSIS — G43709 Chronic migraine without aura, not intractable, without status migrainosus: Secondary | ICD-10-CM | POA: Diagnosis not present

## 2017-12-31 DIAGNOSIS — Q210 Ventricular septal defect: Secondary | ICD-10-CM | POA: Diagnosis not present

## 2018-01-01 DIAGNOSIS — D225 Melanocytic nevi of trunk: Secondary | ICD-10-CM | POA: Diagnosis not present

## 2018-01-01 DIAGNOSIS — D485 Neoplasm of uncertain behavior of skin: Secondary | ICD-10-CM | POA: Diagnosis not present

## 2018-01-20 DIAGNOSIS — L501 Idiopathic urticaria: Secondary | ICD-10-CM | POA: Diagnosis not present

## 2018-01-23 ENCOUNTER — Other Ambulatory Visit: Payer: Self-pay

## 2018-01-23 ENCOUNTER — Ambulatory Visit (INDEPENDENT_AMBULATORY_CARE_PROVIDER_SITE_OTHER): Payer: 59 | Admitting: Psychiatry

## 2018-01-23 ENCOUNTER — Encounter: Payer: Self-pay | Admitting: Psychiatry

## 2018-01-23 VITALS — BP 122/80 | HR 77 | Temp 97.3°F | Wt 171.0 lb

## 2018-01-23 DIAGNOSIS — F338 Other recurrent depressive disorders: Secondary | ICD-10-CM | POA: Diagnosis not present

## 2018-01-23 DIAGNOSIS — Z8659 Personal history of other mental and behavioral disorders: Secondary | ICD-10-CM | POA: Diagnosis not present

## 2018-01-23 DIAGNOSIS — F411 Generalized anxiety disorder: Secondary | ICD-10-CM | POA: Diagnosis not present
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
## 2018-01-27 DIAGNOSIS — L239 Allergic contact dermatitis, unspecified cause: Secondary | ICD-10-CM | POA: Diagnosis not present

## 2018-02-04 DIAGNOSIS — C44111 Basal cell carcinoma of skin of unspecified eyelid, including canthus: Secondary | ICD-10-CM | POA: Diagnosis not present

## 2018-02-10 ENCOUNTER — Ambulatory Visit: Payer: 59 | Admitting: Psychiatry

## 2018-02-14 DIAGNOSIS — C44111 Basal cell carcinoma of skin of unspecified eyelid, including canthus: Secondary | ICD-10-CM | POA: Insufficient documentation

## 2018-02-17 ENCOUNTER — Other Ambulatory Visit: Payer: Self-pay

## 2018-02-17 ENCOUNTER — Encounter: Payer: Self-pay | Admitting: Psychiatry

## 2018-02-17 ENCOUNTER — Ambulatory Visit (INDEPENDENT_AMBULATORY_CARE_PROVIDER_SITE_OTHER): Payer: 59 | Admitting: Psychiatry

## 2018-02-17 VITALS — BP 124/81 | HR 73 | Temp 98.3°F | Wt 173.0 lb

## 2018-02-17 DIAGNOSIS — F411 Generalized anxiety disorder: Secondary | ICD-10-CM | POA: Diagnosis not present

## 2018-02-17 DIAGNOSIS — Z8659 Personal history of other mental and behavioral disorders: Secondary | ICD-10-CM | POA: Diagnosis not present

## 2018-02-17 MED ORDER — DESVENLAFAXINE SUCCINATE ER 50 MG PO TB24
50.0000 mg | ORAL_TABLET | Freq: Every day | ORAL | 1 refills | Status: DC
Start: 2018-02-17 — End: 2018-03-03

## 2018-02-17 NOTE — Patient Instructions (Signed)
Desvenlafaxine extended-release tablets What is this medicine? DESVENLAFAXINE (des VEN la FAX een) is used to treat depression. This medicine may be used for other purposes; ask your health care provider or pharmacist if you have questions. COMMON BRAND NAME(S): Khedezla, Pristiq What should I tell my health care provider before I take this medicine? They need to know if you have any of these conditions: -glaucoma -high blood pressure -kidney disease -liver disease -mania or bipolar disorder -suicidal thoughts or a previous suicide attempt -an unusual reaction to desvenlafaxine, venlafaxine, other medicines, foods, dyes, or preservatives -pregnant or trying to get pregnant -breast-feeding How should I use this medicine? Take this medicine by mouth with a drink of water. Do not crush, cut or chew. Follow the directions on the prescription label. Take your doses at regular intervals. Do not take your medicine more often than directed. Do not stop taking this medicine suddenly except upon the advice of your doctor. Stopping this medicine too quickly may cause serious side effects or your condition may worsen. Contact your pediatrician or health care professional regarding the use of this medicine in children. Special care may be needed. A special MedGuide will be given to you by the pharmacist with each prescription and refill. Be sure to read this information carefully each time. Overdosage: If you think you have taken too much of this medicine contact a poison control center or emergency room at once. NOTE: This medicine is only for you. Do not share this medicine with others. What if I miss a dose? If you miss a dose, take it as soon as you can. If it is almost time for your next dose, take only that dose. Do not take double or extra doses. What may interact with this medicine? Do not take this medicine with any of the following medications: -duloxetine -levomilnacipran -linezolid -MAOIs  like Carbex, Eldepryl, Marplan, Nardil, and Parnate -methylene blue (injected into a vein) -milnacipran -venlafaxine This medicine may also interact with the following medications: -alcohol -amphetamines -aspirin and aspirin-like medicines -certain migraine headache medicines (almotriptan, eletriptan, frovatriptan, naratriptan, rizatriptan, sumatriptan, zolmitriptan) -dexfenfluramine or fenfluramine -furazolidone -isoniazid -lithium -medicines for heart rhythm or blood pressure -medicines that treat or prevent blood clots like warfarin, enoxaparin, and dalteparin -methylphenidate -metoclopramide -NSAIDS, medicines for pain and inflammation, like ibuprofen or naproxen -pentazocine -phentermine -procarbazine -protriptyline -rasagiline -sibutramine -St. John's wort, Hypericum perforatum -tramadol -tryptophan -zolpidem This list may not describe all possible interactions. Give your health care provider a list of all the medicines, herbs, non-prescription drugs, or dietary supplements you use. Also tell them if you smoke, drink alcohol, or use illegal drugs. Some items may interact with your medicine. What should I watch for while using this medicine? Tell your doctor if your symptoms do not get better or if they get worse. Visit your doctor or health care professional for regular checks on your progress. Because it may take several weeks to see the full effects of this medicine, it is important to continue your treatment as prescribed by your doctor. Patients and their families should watch out for new or worsening thoughts of suicide or depression. Also watch out for sudden changes in feelings such as feeling anxious, agitated, panicky, irritable, hostile, aggressive, impulsive, severely restless, overly excited and hyperactive, or not being able to sleep. If this happens, especially at the beginning of treatment or after a change in dose, call your health care professional. This  medicine can cause an increase in blood pressure. Check with your doctor for   instructions on monitoring your blood pressure while taking this medicine. You may get drowsy or dizzy. Do not drive, use machinery, or do anything that needs mental alertness until you know how this medicine affects you. Do not stand or sit up quickly, especially if you are an older patient. This reduces the risk of dizzy or fainting spells. Alcohol may interfere with the effect of this medicine. Avoid alcoholic drinks. Your mouth may get dry. Chewing sugarless gum, sucking hard candy and drinking plenty of water will help. Contact your doctor if the problem does not go away or is severe. What side effects may I notice from receiving this medicine? Side effects that you should report to your doctor or health care professional as soon as possible: -allergic reactions like skin rash, itching or hives, swelling of the face, lips, or tongue -anxious -breathing problems -confusion -changes in vision -chest pain -confusion -elevated mood, decreased need for sleep, racing thoughts, impulsive behavior -eye pain -fast, irregular heartbeat -feeling faint or lightheaded, falls -feeling agitated, angry, or irritable -hallucination, loss of contact with reality -high blood pressure -loss of balance or coordination -palpitations -redness, blistering, peeling or loosening of the skin, including inside the mouth -restlessness, pacing, inability to keep still -seizures -stiff muscles -suicidal thoughts or other mood changes -trouble passing urine or change in the amount of urine -trouble sleeping -unusual bleeding or bruising -unusually weak or tired -vomiting Side effects that usually do not require medical attention (report to your doctor or health care professional if they continue or are bothersome): -change in sex drive or performance -change in appetite or weight -constipation -dizziness -dry  mouth -headache -increased sweating -nausea -tired This list may not describe all possible side effects. Call your doctor for medical advice about side effects. You may report side effects to FDA at 1-800-FDA-1088. Where should I keep my medicine? Keep out of the reach of children. Store at room temperature between 15 and 30 degrees C (59 and 86 degrees F). Throw away any unused medicine after the expiration date. NOTE: This sheet is a summary. It may not cover all possible information. If you have questions about this medicine, talk to your doctor, pharmacist, or health care provider.  2018 Elsevier/Gold Standard (2016-03-29 17:35:48)  

## 2018-02-17 NOTE — Progress Notes (Signed)
[facility] MD OP Progress Note  02/17/2018 3:17 PM Kristine [name]  MRN:  [id number]  Chief Complaint: ' I am ok." Chief Complaint    Follow-up; Medication Refill     HPI: Kristine [name] is a 47 year old female who is married, employed, lives in [place], has a history of multiple diagnoses like ADD, anxiety disorder, seasonal affective disorder, bipolar disorder, presented to the clinic today for a follow-up visit.  Patient also has multiple medical problems including multiple allergies, VSD, chronic urticaria/anaphylaxis, elevated TSH, migraine headaches, GERD, basal cell carcinoma and so on.  Patient today reports she continues to feel anxious.  She reports she has a skin surgery scheduled for the end of April.  She reports she is worried about the same.  She continues to report feeling nervous, restless, racing heart rate and feeling overwhelmed.  She also reports she worries all the time. Pt reports that it is also affecting her focus and concentration.  She reports she was told she may have ADD as a child.  She has never been on medications before.   Patient also reports some sadness on a regular basis .  She reports she feels sad about her situation and also about upcoming surgeries.  Patient also has interrupted sleep at night.  She has tried other medications like Ambien, Klonopin in the past but had opposite reactions to them.  Patient had genesight testing done and it was discussed with patient.  Discussed that we will start one medication at a time and titrate the dose up gradually.  Pristiq, Zoloft and Viibryd are on the 'use as directed list 'for patient.  Discussed with her that those medications may not have significant side effects if tried.  Discussed with her that she still can have allergic reactions to the medications and she will have to monitor herself for the same.  She reports she has tried Zoloft in the past.  Patient reports she does not want to try that again.  Agreeable to starting  Pristiq.  Discussed continuing CBT.  Patient reports she has an appointment scheduled with Kristine [name] next week.  Discussed with her the significance of having more therapy visits since she is having side effects to multiple medications, may be having more intensive and more frequent psychotherapy visits will be helpful.   Visit Diagnosis:    ICD-10-CM   1. GAD (generalized anxiety disorder) F41.1 desvenlafaxine (PRISTIQ) 50 MG 24 hr tablet  2. History of ADHD Z86.59     Past Psychiatric History: Patient mental health admission when she was a child in [place].  She had overdosed on NSAIDs at that time.  She reports she could not get the right help for her headaches and hence decided to take more pills.  She reports she has been to a psychiatrist in the past and was given medications.  She does not remember names.  She however does remember that she always had opposite reaction to medications in general.  She does remember trying Ambien-sleepwalking, Klonopin-disinhibited, gabapentin-prescribed for pain made her crazy, Wellbutrin-paradoxical reaction, Zoloft-side effects.  Past Medical History:  Past Medical History:  Diagnosis Date  . Abnormal weight gain 08/05/2013  . Absence of interventricular septum 08/05/2013  . Allergic rhinitis 04/17/2016  . Allergic urticaria due to ingested food 02/09/2016  . Anxiety   . Asthma   . Clinical depression 09/30/2013  . Complication of anesthesia    PT HAD TROUBLE BREATHING AFTER HYSTERECTOMY IN OCTOBER  . Depression   . Dizziness   .  Excess weight 09/30/2013  . GERD (gastroesophageal reflux disease)   . Headache, migraine 09/30/2013  . Heart murmur   . Heavy drinker 04/11/2014   Overview:  Patient reports drinking 2-3 bottles of wine each weekend.    . History of methicillin resistant staphylococcus aureus (MRSA) 11/2015  . Infection of urinary tract 09/30/2013  . Irregular bleeding 09/30/2013  . Multiple allergies   . VSD (ventricular septal defect  and aortic arch hypoplasia     Past Surgical History:  Procedure Laterality Date  . BOTOX INJECTION N/A 10/23/2016   Procedure: BOTOX INJECTION;  Surgeon: Royston Cowper, MD;  Location: [facility] ORS;  Service: Urology;  Laterality: N/A;  . BREAST BIOPSY Left 11/13/13   benign  . CESAREAN SECTION    . COLONOSCOPY    . CORONARY ANGIOPLASTY    . CYSTOSCOPY N/A 10/23/2016   Procedure: CYSTOSCOPY;  Surgeon: Royston Cowper, MD;  Location: [facility] OR[facility];  Service: Urology;  Laterality: N/A;  . CYSTOSCOPY WITH HYDRODISTENSION AND BIOPSY    . LAPAROSCOPIC ASSISTED VAGINAL HYSTERECTOMY N/A 08/20/2016   Procedure: LAPAROSCOPIC ASSISTED VAGINAL HYSTERECTOMY;  Surgeon: Boykin Nearing, MD;  Location: [facility] ORS;  Service: Gynecology;  Laterality: N/A;  . LAPAROSCOPIC BILATERAL SALPINGECTOMY Bilateral 08/20/2016   Procedure: LAPAROSCOPIC BILATERAL SALPINGECTOMY;  Surgeon: Boykin Nearing, MD;  Location: [facility] OR[facility];  Service: Gynecology;  Laterality: Bilateral;  . NASAL SINUS SURGERY     x12    Family Psychiatric History: Mother-drug abuse.  Mother may have had mental illness.  Son-drug abuse  Family History:  Family History  Problem Relation Age of Onset  . Breast cancer Maternal Aunt   . Breast cancer Paternal Aunt   . Breast cancer Maternal Grandmother   . Breast cancer Paternal Grandmother   . Breast cancer Paternal Aunt   . Bipolar disorder Paternal Aunt   . Kidney cancer Maternal Uncle   . Prostate cancer Paternal Grandfather   . Kidney disease Unknown        father's side  . Aneurysm Mother   . Liver disease Mother   . Drug abuse Mother   . Alcohol abuse Mother   . Anxiety disorder Mother   . Depression Mother   . Cancer Father   . Anxiety disorder Father   . Depression Father    Substance abuse history: Denies  Social History:Lives in [place].  Had a difficult childhood.  Her mother married 7 times and hence she lived in every state in [place].  Employed as a Theme park manager.   Married x3, divorced x2.  Been with this husband since the past 5 years.  Has 2 children, 3 year old daughter, doing well.  35 year old son is in jail for drug charges.  Has an associate degree. Social History   Socioeconomic History  . Marital status: Married    Spouse name: greg   . Number of children: 2  . Years of education: Not on file  . Highest education level: Associate degree: occupational, Hotel manager, or vocational program  Occupational History  . Not on file  Social Needs  . Financial resource strain: Not hard at all  . Food insecurity:    Worry: Never true    Inability: Never true  . Transportation needs:    Medical: No    Non-medical: No  Tobacco Use  . Smoking status: Former Smoker    Types: Cigarettes    Last attempt to quit: 11/12/2006    Years since quitting: 11.2  . Smokeless tobacco: Never Used  .  Tobacco comment: quit 2008  Substance and Sexual Activity  . Alcohol use: No    Alcohol/week: 0.0 oz  . Drug use: No  . Sexual activity: Yes    Birth control/protection: None  Lifestyle  . Physical activity:    Days per week: 0 days    Minutes per session: 0 min  . Stress: Very much  Relationships  . Social connections:    Talks on phone: Not on file    Gets together: Not on file    Attends religious service: More than 4 times per year    Active member of club or organization: No    Attends meetings of clubs or organizations: Never    Relationship status: Married  Other Topics Concern  . Not on file  Social History Narrative  . Not on file    Allergies:  Allergies  Allergen Reactions  . Bee Pollen Anaphylaxis  . Bee Venom Anaphylaxis  . Eggs Or Egg-Derived Products Anaphylaxis  . Keflex [Cephalexin] Anaphylaxis  . Lac Bovis Anaphylaxis  . Lactase Anaphylaxis and Other (See Comments)  . Milk-Related Compounds Anaphylaxis  . Penicillins Anaphylaxis    Has patient had a PCN reaction causing immediate rash, facial/tongue/throat swelling, SOB or  lightheadedness with hypotension: No Has patient had a PCN reaction causing severe rash involving mucus membranes or skin necrosis: No Has patient had a PCN reaction that required hospitalization No Has patient had a PCN reaction occurring within the last 10 years: Yes If all of the above answers are "NO", then may proceed with Cephalosporin use.   . Shellfish Allergy Anaphylaxis and Other (See Comments)  . Soy Allergy Anaphylaxis  . Wheat Bran Anaphylaxis  . Codeine     Other reaction(s): RASH  . Ferrous Gluconate Diarrhea    And vomiting And vomiting  . Hydrocodone Hives  . Iron Diarrhea  . Iodine Rash  . Latex Itching and Rash    Metabolic Disorder Labs: No results found for: HGBA1C, MPG No results found for: PROLACTIN No results found for: CHOL, TRIG, HDL, CHOLHDL, VLDL, LDLCALC Lab Results  Component Value Date   TSH 5.752 (H) 09/19/2017   TSH 1.190 09/04/2016    Therapeutic Level Labs: No results found for: LITHIUM No results found for: VALPROATE No components found for:  CBMZ  Current Medications: Current Outpatient Medications  Medication Sig Dispense Refill  . albuterol (PROVENTIL HFA;VENTOLIN HFA) 108 (90 Base) MCG/ACT inhaler Inhale 2 puffs into the lungs every 6 (six) hours as needed for wheezing or shortness of breath. 1 Inhaler 2  . azelastine (OPTIVAR) 0.05 % ophthalmic solution Place 1 drop into both eyes daily.    . Azelastine HCl 0.15 % SOLN Place 2 sprays into the nose 2 (two) times daily.     . Biotin 10000 MCG TABS Take 10,000 mg by mouth daily.    . cetirizine (ZYRTEC) 10 MG tablet Take 2 tablets by mouth 2 (two) times daily.     . Cholecalciferol (VITAMIN D-1000 MAX ST) 1000 units tablet Take 1 tablet by mouth 2 (two) times daily.     Marland Kitchen CRANBERRY PO Take 1 capsule by mouth at bedtime.     Marland Kitchen EPINEPHrine 0.3 mg/0.3 mL IJ SOAJ injection Inject 0.3 mLs (0.3 mg total) into the muscle once. 1 Device 1  . fluticasone (FLONASE) 50 MCG/ACT nasal spray Place  1 spray into both nostrils 2 (two) times daily.     . Fluticasone-Salmeterol (ADVAIR) 250-50 MCG/DOSE AEPB Inhale 1 puff into the  lungs 2 (two) times daily.    . hydrochlorothiazide (HYDRODIURIL) 25 MG tablet Take 25 mg by mouth daily.    . hydrOXYzine (ATARAX/VISTARIL) 50 MG tablet hydroxyzine HCl 50 mg tablet    . levocetirizine (XYZAL) 5 MG tablet Take by mouth.    . magnesium oxide (MAG-OX) 400 MG tablet Take 400 mg by mouth daily.    . montelukast (SINGULAIR) 10 MG tablet Take 10 mg by mouth at bedtime.     . Multiple Vitamins-Minerals (ZINC PO) Take 1 tablet by mouth daily.    . nitrofurantoin, macrocrystal-monohydrate, (MACROBID) 100 MG capsule Take by mouth.    Marland Kitchen ofloxacin (FLOXIN) 0.3 % OTIC solution ofloxacin 0.3 % ear drops    . Omalizumab (XOLAIR [place]) Inject 1 Dose into the skin every 30 (thirty) days.    . potassium chloride (K-DUR) 10 MEQ tablet Take by mouth.    . ranitidine (ZANTAC) 150 MG tablet Take 150 mg by mouth as directed. Takes 1 in the morning 1 before each meal and 1 before bedtime    . vitamin B-12 (CYANOCOBALAMIN) 1000 MCG tablet Take 1,000 mcg by mouth daily.    . vitamin C (ASCORBIC ACID) 500 MG tablet Take 500 mg by mouth at bedtime.    Marland Kitchen desvenlafaxine (PRISTIQ) 50 MG 24 hr tablet Take 1 tablet (50 mg total) by mouth daily. 15 tablet 1   No current facility-administered medications for this visit.      Musculoskeletal: Strength & Muscle Tone: within normal limits Gait & Station: normal Patient leans: N/A  Psychiatric Specialty Exam: Review of Systems  Psychiatric/Behavioral: Positive for depression. The patient is nervous/anxious and has insomnia.   All other systems reviewed and are negative.   Blood pressure 124/81, pulse 73, temperature 98.3 F (36.8 C), temperature source Oral, weight 173 lb (78.5 kg), last menstrual period 07/17/2016.Body mass index is 31.64 kg/m.  General Appearance: Casual  Eye Contact:  Fair  Speech:  Normal Rate  Volume:   Normal  Mood:  Anxious and Dysphoric  Affect:  Congruent  Thought Process:  Goal Directed and Descriptions of Associations: Intact  Orientation:  Full (Time, Place, and Person)  Thought Content: Logical   Suicidal Thoughts:  No  Homicidal Thoughts:  No  Memory:  Immediate;   Fair Recent;   Fair Remote;   Fair  Judgement:  Fair  Insight:  Fair  Psychomotor Activity:  Normal  Concentration:  Concentration: Fair and Attention Span: Fair  Recall:  [employer] of Knowledge: Fair  Language: Fair  Akathisia:  No  Handed:  Right  AIMS (if indicated): na  Assets:  Desire for Improvement Social Support  ADL's:  Intact  Cognition: WNL  Sleep:  restless   Screenings:   Assessment and Plan: Chloeann is a 47 year old female who has a history of anxiety, ADHD, seasonal affective disorder, multiple allergies likely chronic allergies with urticaria-anaphylaxis, recent diagnosis of basal cell carcinoma, history of elevated TSH, presented to the clinic today for a follow-up visit.  Patient had recent genesight  testing done and it was discussed with patient today.  However discussed with her that, that does not rule out allergic reactions to medications.  Patient agrees to start Pristiq.  Patient does have several psychosocial stressors like  her health issues, history of trauma as well as is biologically predisposed given her mental health problem history in her family.  Plan as noted below.  Plan  GAD Patient with history of paradoxical reaction or allergic reaction  to multiple medications in the past. Patient has been referred for CBT with Ms. Royal Piedra.  Discussed the need for more intensive and more frequent therapy visits.  Patient agrees with plan. Patient had genesight testing done.  It was discussed with patient today. Patient agrees to start Pristiq 50 mg p.o. daily. Provided medication education, provided handouts. Patient will monitor herself for allergic reaction.  Hx of  ADHD We will monitor closely.  Will not start medications today.  For elevated TSH She will continue to follow-up with PMD  For insomnia Discussed sleep hygiene techniques.  She will start CBT with Ms. Peacock soon.  Follow-up in clinic in 10 days or sooner if needed.  More than 50 % of the time was spent for psychoeducation and supportive psychotherapy and care coordination.  This note was generated in part or whole with voice recognition software. Voice recognition is usually quite accurate but there are transcription errors that can and very often do occur. I apologize for any typographical errors that were not detected and corrected.     Ursula Alert, MD 02/18/2018, 8:45 AM

## 2018-02-18 ENCOUNTER — Encounter: Payer: Self-pay | Admitting: Psychiatry

## 2018-02-24 ENCOUNTER — Ambulatory Visit (INDEPENDENT_AMBULATORY_CARE_PROVIDER_SITE_OTHER): Payer: 59 | Admitting: Licensed Clinical Social Worker

## 2018-02-24 DIAGNOSIS — F411 Generalized anxiety disorder: Secondary | ICD-10-CM

## 2018-02-24 DIAGNOSIS — Z8659 Personal history of other mental and behavioral disorders: Secondary | ICD-10-CM

## 2018-02-24 DIAGNOSIS — L501 Idiopathic urticaria: Secondary | ICD-10-CM | POA: Diagnosis not present
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
## 2018-02-26 DIAGNOSIS — L501 Idiopathic urticaria: Secondary | ICD-10-CM | POA: Diagnosis not present

## 2018-03-03 ENCOUNTER — Ambulatory Visit (INDEPENDENT_AMBULATORY_CARE_PROVIDER_SITE_OTHER): Payer: 59 | Admitting: Psychiatry

## 2018-03-03 ENCOUNTER — Other Ambulatory Visit: Payer: Self-pay

## 2018-03-03 ENCOUNTER — Encounter: Payer: Self-pay | Admitting: Psychiatry

## 2018-03-03 VITALS — BP 130/76 | HR 73 | Temp 99.0°F | Wt 172.2 lb

## 2018-03-03 DIAGNOSIS — F40298 Other specified phobia: Secondary | ICD-10-CM

## 2018-03-03 DIAGNOSIS — Z8659 Personal history of other mental and behavioral disorders: Secondary | ICD-10-CM | POA: Diagnosis not present

## 2018-03-03 DIAGNOSIS — F411 Generalized anxiety disorder: Secondary | ICD-10-CM | POA: Diagnosis not present

## 2018-03-03 MED ORDER — DESVENLAFAXINE SUCCINATE ER 50 MG PO TB24: 50.0000 mg | ORAL_TABLET | Freq: Every day | ORAL | 0 refills | Status: DC

## 2018-03-03 NOTE — Progress Notes (Signed)
[facility] MD OP Progress Note  03/03/2018 2:01 PM AMIRE GOSSEN  MRN:  [id number]  Chief Complaint: ' I am here for follow up.' Chief Complaint    Follow-up; Medication Refill     HPI: Kristine [name] is a 47 year old female who is married, employed, lives in [place], has a history of multiple diagnoses like ADD, anxiety disorder, seasonal affective disorder, bipolar disorder, presented to the clinic today for a follow-up visit.  Patient also has multiple medical problems including multiple allergies, VSD, chronic urticaria/anaphylaxis, elevated TSH, migraine headaches, GERD, basal cell carcinoma and so on.  Patient today reports she continues to feel anxious.  She wonders whether it is due to the medication.  She reports she started taking the medication at bedtime and it affected her sleep.  She hence switched the medication to the morning.  She reports ever since then she has been able to sleep better.  She however continues to feel on the edge.  Discussed reducing the dose of medication however she reports she is getting used to it and would like to continue the same for now.  She also reports some generalized worry about her upcoming skin surgery scheduled for June.  She reports she has a phobia of needles.  She reports she has agitation and anxiety attacks when she has to go through procedures.  She reports she has been talking to her surgeon about the same.  She however does not know if there is anything they can do at this time since her skin surgery has to be done as different sessions on the same day.  She also does not know if her insurance will pay for sedating her during the procedure.  She denies any other concerns today.  She has been referred for psychotherapy sessions with Ms. Royal Piedra.  She has several upcoming appointments scheduled with her.   Visit Diagnosis:    ICD-10-CM   1. GAD (generalized anxiety disorder) F41.1 desvenlafaxine (PRISTIQ) 50 MG 24 hr tablet  2. History of  ADHD Z86.59   3. Needle phobia F40.298     Past Psychiatric History: Patient has a history of mental health admission when she was a child in [place].  She had overdosed on NSAIDs at that time.  She reports she could not get the right help for her headaches and hence decided to take more pills at that time.  She reports she has been to a psychiatrist in the past and was given medications.  She does not remember names.  She however does remember that she always had opposite reaction to medications in general.  She does remember trying Ambien-sleepwalking, Klonopin-disinhibited, gabapentin-prescribed for pain made her crazy, Wellbutrin-paradoxical reaction, Zoloft-side effects.  Past Medical History:  Past Medical History:  Diagnosis Date  . Abnormal weight gain 08/05/2013  . Absence of interventricular septum 08/05/2013  . Allergic rhinitis 04/17/2016  . Allergic urticaria due to ingested food 02/09/2016  . Anxiety   . Asthma   . Clinical depression 09/30/2013  . Complication of anesthesia    PT HAD TROUBLE BREATHING AFTER HYSTERECTOMY IN OCTOBER  . Depression   . Dizziness   . Excess weight 09/30/2013  . GERD (gastroesophageal reflux disease)   . Headache, migraine 09/30/2013  . Heart murmur   . Heavy drinker 04/11/2014   Overview:  Patient reports drinking 2-3 bottles of wine each weekend.    . History of methicillin resistant staphylococcus aureus (MRSA) 11/2015  . Infection of urinary tract 09/30/2013  . Irregular bleeding  09/30/2013  . Multiple allergies   . VSD (ventricular septal defect and aortic arch hypoplasia     Past Surgical History:  Procedure Laterality Date  . BOTOX INJECTION N/A 10/23/2016   Procedure: BOTOX INJECTION;  Surgeon: Royston Cowper, MD;  Location: [facility] ORS;  Service: Urology;  Laterality: N/A;  . BREAST BIOPSY Left 11/13/13   benign  . CESAREAN SECTION    . COLONOSCOPY    . CORONARY ANGIOPLASTY    . CYSTOSCOPY N/A 10/23/2016   Procedure: CYSTOSCOPY;   Surgeon: Royston Cowper, MD;  Location: [facility] ORS;  Service: Urology;  Laterality: N/A;  . CYSTOSCOPY WITH HYDRODISTENSION AND BIOPSY    . LAPAROSCOPIC ASSISTED VAGINAL HYSTERECTOMY N/A 08/20/2016   Procedure: LAPAROSCOPIC ASSISTED VAGINAL HYSTERECTOMY;  Surgeon: Boykin Nearing, MD;  Location: [facility] ORS;  Service: Gynecology;  Laterality: N/A;  . LAPAROSCOPIC BILATERAL SALPINGECTOMY Bilateral 08/20/2016   Procedure: LAPAROSCOPIC BILATERAL SALPINGECTOMY;  Surgeon: Boykin Nearing, MD;  Location: [facility] ORS;  Service: Gynecology;  Laterality: Bilateral;  . NASAL SINUS SURGERY     x12    Family Psychiatric History: Mother-drug abuse.  Mother may have had mental illness.  Son-drug abuse.  Family History:  Family History  Problem Relation Age of Onset  . Breast cancer Maternal Aunt   . Breast cancer Paternal Aunt   . Breast cancer Maternal Grandmother   . Breast cancer Paternal Grandmother   . Breast cancer Paternal Aunt   . Bipolar disorder Paternal Aunt   . Kidney cancer Maternal Uncle   . Prostate cancer Paternal Grandfather   . Kidney disease Unknown        father's side  . Aneurysm Mother   . Liver disease Mother   . Drug abuse Mother   . Alcohol abuse Mother   . Anxiety disorder Mother   . Depression Mother   . Cancer Father   . Anxiety disorder Father   . Depression Father    Substance abuse history: Denies  Social History: Lives in [place].  Had a difficult childhood.  Her mother married 7 times and hence she lived in every state in [place].  Employed as a Theme park manager.  Married x3, divorced x2.  Been with this husband since the past 5 years.  Has 2 children, 49 year old daughter, doing well.  24 year old son in jail for drug charges.  Has an associate degree Social History   Socioeconomic History  . Marital status: Married    Spouse name: greg   . Number of children: 2  . Years of education: Not on file  . Highest education level: Associate degree: occupational,  Hotel manager, or vocational program  Occupational History  . Not on file  Social Needs  . Financial resource strain: Not hard at all  . Food insecurity:    Worry: Never true    Inability: Never true  . Transportation needs:    Medical: No    Non-medical: No  Tobacco Use  . Smoking status: Former Smoker    Types: Cigarettes    Last attempt to quit: 11/12/2006    Years since quitting: 11.3  . Smokeless tobacco: Never Used  . Tobacco comment: quit 2008  Substance and Sexual Activity  . Alcohol use: No    Alcohol/week: 0.0 oz  . Drug use: No  . Sexual activity: Yes    Birth control/protection: None  Lifestyle  . Physical activity:    Days per week: 0 days    Minutes per session: 0 min  .  Stress: Very much  Relationships  . Social connections:    Talks on phone: Not on file    Gets together: Not on file    Attends religious service: More than 4 times per year    Active member of club or organization: No    Attends meetings of clubs or organizations: Never    Relationship status: Married  Other Topics Concern  . Not on file  Social History Narrative  . Not on file    Allergies:  Allergies  Allergen Reactions  . Bee Pollen Anaphylaxis  . Bee Venom Anaphylaxis  . Eggs Or Egg-Derived Products Anaphylaxis  . Keflex [Cephalexin] Anaphylaxis  . Lac Bovis Anaphylaxis  . Lactase Anaphylaxis and Other (See Comments)  . Milk-Related Compounds Anaphylaxis  . Penicillins Anaphylaxis    Has patient had a PCN reaction causing immediate rash, facial/tongue/throat swelling, SOB or lightheadedness with hypotension: No Has patient had a PCN reaction causing severe rash involving mucus membranes or skin necrosis: No Has patient had a PCN reaction that required hospitalization No Has patient had a PCN reaction occurring within the last 10 years: Yes If all of the above answers are "NO", then may proceed with Cephalosporin use.   . Shellfish Allergy Anaphylaxis and Other (See Comments)   . Soy Allergy Anaphylaxis  . Wheat Bran Anaphylaxis  . Codeine     Other reaction(s): RASH  . Ferrous Gluconate Diarrhea    And vomiting And vomiting  . Hydrocodone Hives  . Iron Diarrhea  . Iodine Rash  . Latex Itching and Rash    Metabolic Disorder Labs: No results found for: HGBA1C, MPG No results found for: PROLACTIN No results found for: CHOL, TRIG, HDL, CHOLHDL, VLDL, LDLCALC Lab Results  Component Value Date   TSH 5.752 (H) 09/19/2017   TSH 1.190 09/04/2016    Therapeutic Level Labs: No results found for: LITHIUM No results found for: VALPROATE No components found for:  CBMZ  Current Medications: Current Outpatient Medications  Medication Sig Dispense Refill  . albuterol (PROVENTIL HFA;VENTOLIN HFA) 108 (90 Base) MCG/ACT inhaler Inhale 2 puffs into the lungs every 6 (six) hours as needed for wheezing or shortness of breath. 1 Inhaler 2  . azelastine (OPTIVAR) 0.05 % ophthalmic solution Place 1 drop into both eyes daily.    . Azelastine HCl 0.15 % SOLN Place 2 sprays into the nose 2 (two) times daily.     . Biotin 10000 MCG TABS Take 10,000 mg by mouth daily.    . cetirizine (ZYRTEC) 10 MG tablet Take 2 tablets by mouth 2 (two) times daily.     . Cholecalciferol (VITAMIN D-1000 MAX ST) 1000 units tablet Take 1 tablet by mouth 2 (two) times daily.     Marland Kitchen CRANBERRY PO Take 1 capsule by mouth at bedtime.     Marland Kitchen desvenlafaxine (PRISTIQ) 50 MG 24 hr tablet Take 1 tablet (50 mg total) by mouth daily. 90 tablet 0  . EPINEPHrine 0.3 mg/0.3 mL IJ SOAJ injection Inject 0.3 mLs (0.3 mg total) into the muscle once. 1 Device 1  . fluticasone (FLONASE) 50 MCG/ACT nasal spray Place 1 spray into both nostrils 2 (two) times daily.     . Fluticasone-Salmeterol (ADVAIR) 250-50 MCG/DOSE AEPB Inhale 1 puff into the lungs 2 (two) times daily.    . hydrochlorothiazide (HYDRODIURIL) 25 MG tablet Take 25 mg by mouth daily.    . hydrOXYzine (ATARAX/VISTARIL) 50 MG tablet hydroxyzine HCl 50 mg  tablet    . levocetirizine (  XYZAL) 5 MG tablet Take by mouth.    . magnesium oxide (MAG-OX) 400 MG tablet Take 400 mg by mouth daily.    . montelukast (SINGULAIR) 10 MG tablet Take 10 mg by mouth at bedtime.     . Multiple Vitamins-Minerals (ZINC PO) Take 1 tablet by mouth daily.    . nitrofurantoin, macrocrystal-monohydrate, (MACROBID) 100 MG capsule Take by mouth.    Marland Kitchen ofloxacin (FLOXIN) 0.3 % OTIC solution ofloxacin 0.3 % ear drops    . Omalizumab (XOLAIR [place]) Inject 1 Dose into the skin every 30 (thirty) days.    . potassium chloride (K-DUR) 10 MEQ tablet Take by mouth.    . ranitidine (ZANTAC) 150 MG tablet Take 150 mg by mouth as directed. Takes 1 in the morning 1 before each meal and 1 before bedtime    . vitamin B-12 (CYANOCOBALAMIN) 1000 MCG tablet Take 1,000 mcg by mouth daily.    . vitamin C (ASCORBIC ACID) 500 MG tablet Take 500 mg by mouth at bedtime.     No current facility-administered medications for this visit.      Musculoskeletal: Strength & Muscle Tone: within normal limits Gait & Station: normal Patient leans: N/A  Psychiatric Specialty Exam: Review of Systems  Psychiatric/Behavioral: The patient is nervous/anxious.   All other systems reviewed and are negative.   Blood pressure 130/76, pulse 73, temperature 99 F (37.2 C), temperature source Oral, weight 172 lb 3.2 oz (78.1 kg), last menstrual period 07/17/2016.Body mass index is 31.5 kg/m.  General Appearance: Casual  Eye Contact:  Fair  Speech:  Normal Rate  Volume:  Normal  Mood:  Anxious  Affect:  Congruent  Thought Process:  Goal Directed and Descriptions of Associations: Intact  Orientation:  Full (Time, Place, and Person)  Thought Content: Logical   Suicidal Thoughts:  No  Homicidal Thoughts:  No  Memory:  Immediate;   Fair Recent;   Fair Remote;   Fair  Judgement:  Fair  Insight:  Fair  Psychomotor Activity:  Normal  Concentration:  Concentration: Fair and Attention Span: Fair  Recall:   [employer] of Knowledge: Fair  Language: Fair  Akathisia:  No  Handed:  Right  AIMS (if indicated): na  Assets:  Communication Skills Desire for Improvement Housing  ADL's:  Intact  Cognition: WNL  Sleep:  Fair   Screenings:   Assessment and Plan:Gabi is a 47 year old female who has a history of anxiety, ADHD, seasonal affective disorder, multiple allergies like chronic allergies with urticaria/anaphylaxis, recent diagnosis of basal cell carcinoma, history of elevated TSH, presented to the clinic today for a follow-up visit.  Patient continues to have some anxiety symptoms.  She has started the Pristiq 2 weeks ago and is tolerating it well.  Denies any side effects other than anxiety.  She does have several psychosocial stressors including her upcoming surgery.  She is also worried about her skin procedure which is coming up.  She does have a history of needle phobia.  Patient will continue CBT with Ms. Peacock.  Plan as noted below.  Plan GAD Patient with history of paradoxical reaction or allergic reaction to multiple medications in the past. She has been referred for CBT with Ms. Royal Piedra. Will continue Pristiq 50 mg p.o. daily   History of ADHD Will continue to monitor closely.  For specific phobia/needles She will continue CBT.  For elevated TSH Continue follow-up with PMD  For insomnia Discussed sleep hygiene techniques.  She will continue the same.  Follow-up in clinic in 4-5 weeks or sooner if needed.  More than 50 % of the time was spent for psychoeducation and supportive psychotherapy and care coordination.  This note was generated in part or whole with voice recognition software. Voice recognition is usually quite accurate but there are transcription errors that can and very often do occur. I apologize for any typographical errors that were not detected and corrected.         Ursula Alert, MD 03/03/2018, 8:13 PM

## 2018-03-24 DIAGNOSIS — L501 Idiopathic urticaria: Secondary | ICD-10-CM | POA: Diagnosis not present

## 2018-03-25 ENCOUNTER — Ambulatory Visit: Payer: 59 | Admitting: Licensed Clinical Social Worker

## 2018-03-31 ENCOUNTER — Ambulatory Visit: Payer: 59 | Admitting: Psychiatry

## 2018-03-31 ENCOUNTER — Ambulatory Visit: Payer: Self-pay | Admitting: Licensed Clinical Social Worker

## 2018-04-06 DIAGNOSIS — L501 Idiopathic urticaria: Secondary | ICD-10-CM | POA: Diagnosis not present

## 2018-04-11 DIAGNOSIS — E039 Hypothyroidism, unspecified: Secondary | ICD-10-CM | POA: Diagnosis not present

## 2018-04-14 ENCOUNTER — Ambulatory Visit (INDEPENDENT_AMBULATORY_CARE_PROVIDER_SITE_OTHER): Payer: 59 | Admitting: Psychiatry

## 2018-04-14 ENCOUNTER — Other Ambulatory Visit: Payer: Self-pay

## 2018-04-14 ENCOUNTER — Encounter: Payer: Self-pay | Admitting: Psychiatry

## 2018-04-14 ENCOUNTER — Ambulatory Visit (INDEPENDENT_AMBULATORY_CARE_PROVIDER_SITE_OTHER): Payer: 59 | Admitting: Licensed Clinical Social Worker

## 2018-04-14 VITALS — BP 105/68 | HR 83 | Temp 98.7°F | Wt 173.4 lb

## 2018-04-14 DIAGNOSIS — F411 Generalized anxiety disorder: Secondary | ICD-10-CM | POA: Diagnosis not present

## 2018-04-14 DIAGNOSIS — R7989 Other specified abnormal findings of blood chemistry: Secondary | ICD-10-CM | POA: Diagnosis not present

## 2018-04-14 DIAGNOSIS — Z8659 Personal history of other mental and behavioral disorders: Secondary | ICD-10-CM

## 2018-04-14 NOTE — Progress Notes (Signed)
[facility] MD OP Progress Note  04/14/2018 4:01 PM Kristine [name]  MRN:  [id number]  Chief Complaint: ' I am here for follow up." Chief Complaint    Follow-up; Medication Refill     HPI: Kristine [name] is a 47 year old female who is married, employed, lives in [place], has a history of multiple diagnoses like ADD, anxiety disorder, seasonal affective disorder, bipolar disorder, presented to the clinic today for a follow-up visit.  Patient also has multiple medical problems including multiple allergies, VSD, chronic urticaria/anaphylaxis, elevated TSH, migraine headaches, GERD, basal cell carcinoma and so on.  Patient today reports she continues to feel anxious.  She reports there is a lot going on in her life right now.  She reports she has been scheduled for the skin surgery late June.  She also reports her primary medical doctor is currently working on her to get her stable on her thyroid medications.  Her thyroid medication was recently increased.  She is waiting for pending lab reports.  Patient reports she is tolerating the Pristiq well.  She denies any side effects.  She however does not know if it is helping as much as it she wants it to.  She reports she struggles with fatigue, lethargic during the day.  She does not know if it is due to her mood symptoms or due to all the medication she is on.  She reports she is on multiple medications for her multiple medical problems and takes several allergy medications in the morning.  Discussed with patient that she could be struggling with side effects to these medications which could contribute to her fatigue, lethargy and so on.  Patient reports her husband has observed her a sleeping better even though she does not know if that is right or not.  She reports she does not feel that way when she wakes up in the morning.  Discussed with patient that we can wait until her thyroid is worked up and she is on a more stable dose of thyroid medications to make further  readjustment to her antidepressant.  She reports she is willing to wait.  Patient will continue psychotherapy visits with Ms. Peacock on a frequent basis.   Visit Diagnosis:    ICD-10-CM   1. GAD (generalized anxiety disorder) F41.1   2. History of ADHD Z86.59     Past Psychiatric History: Past trials of Klonopin-disinhibited, gabapentin-prescribed for pain made her crazy, Ambien-sleepwalking, Wellbutrin-paradoxical reaction, Zoloft-side effects.  I have reviewed past psychiatric history from my progress note on 03/03/2018.  Past Medical History:  Past Medical History:  Diagnosis Date  . Abnormal weight gain 08/05/2013  . Absence of interventricular septum 08/05/2013  . Allergic rhinitis 04/17/2016  . Allergic urticaria due to ingested food 02/09/2016  . Anxiety   . Asthma   . Clinical depression 09/30/2013  . Complication of anesthesia    PT HAD TROUBLE BREATHING AFTER HYSTERECTOMY IN OCTOBER  . Depression   . Dizziness   . Excess weight 09/30/2013  . GERD (gastroesophageal reflux disease)   . Headache, migraine 09/30/2013  . Heart murmur   . Heavy drinker 04/11/2014   Overview:  Patient reports drinking 2-3 bottles of wine each weekend.    . History of methicillin resistant staphylococcus aureus (MRSA) 11/2015  . Infection of urinary tract 09/30/2013  . Irregular bleeding 09/30/2013  . Multiple allergies   . VSD (ventricular septal defect and aortic arch hypoplasia     Past Surgical History:  Procedure Laterality Date  .  BOTOX INJECTION N/A 10/23/2016   Procedure: BOTOX INJECTION;  Surgeon: Royston Cowper, MD;  Location: [facility];  Service: Urology;  Laterality: N/A;  . BREAST BIOPSY Left 11/13/13   benign  . CESAREAN SECTION    . COLONOSCOPY    . CORONARY ANGIOPLASTY    . CYSTOSCOPY N/A 10/23/2016   Procedure: CYSTOSCOPY;  Surgeon: Royston Cowper, MD;  Location: [facility];  Service: Urology;  Laterality: N/A;  . CYSTOSCOPY WITH HYDRODISTENSION AND BIOPSY    .  LAPAROSCOPIC ASSISTED VAGINAL HYSTERECTOMY N/A 08/20/2016   Procedure: LAPAROSCOPIC ASSISTED VAGINAL HYSTERECTOMY;  Surgeon: Boykin Nearing, MD;  Location: [facility];  Service: Gynecology;  Laterality: N/A;  . LAPAROSCOPIC BILATERAL SALPINGECTOMY Bilateral 08/20/2016   Procedure: LAPAROSCOPIC BILATERAL SALPINGECTOMY;  Surgeon: Boykin Nearing, MD;  Location: [facility];  Service: Gynecology;  Laterality: Bilateral;  . NASAL SINUS SURGERY     x12    Family Psychiatric History: Have reviewed family psychiatric history  from my progress note on 03/03/2018  Family History:  Family History  Problem Relation Age of Onset  . Breast cancer Maternal Aunt   . Breast cancer Paternal Aunt   . Breast cancer Maternal Grandmother   . Breast cancer Paternal Grandmother   . Breast cancer Paternal Aunt   . Bipolar disorder Paternal Aunt   . Kidney cancer Maternal Uncle   . Prostate cancer Paternal Grandfather   . Kidney disease Unknown        father's side  . Aneurysm Mother   . Liver disease Mother   . Drug abuse Mother   . Alcohol abuse Mother   . Anxiety disorder Mother   . Depression Mother   . Cancer Father   . Anxiety disorder Father   . Depression Father   Substance abuse history: Denies   Social History: Have reviewed social history from my progress note on 03/03/2018 Social History   Socioeconomic History  . Marital status: Married    Spouse name: greg   . Number of children: 2  . Years of education: Not on file  . Highest education level: Associate degree: occupational, Hotel manager, or vocational program  Occupational History  . Not on file  Social Needs  . Financial resource strain: Not hard at all  . Food insecurity:    Worry: Never true    Inability: Never true  . Transportation needs:    Medical: No    Non-medical: No  Tobacco Use  . Smoking status: Former Smoker    Types: Cigarettes    Last attempt to quit: 11/12/2006    Years since quitting: 11.4  .  Smokeless tobacco: Never Used  . Tobacco comment: quit 2008  Substance and Sexual Activity  . Alcohol use: No    Alcohol/week: 0.0 oz  . Drug use: No  . Sexual activity: Yes    Birth control/protection: None  Lifestyle  . Physical activity:    Days per week: 0 days    Minutes per session: 0 min  . Stress: Very much  Relationships  . Social connections:    Talks on phone: Not on file    Gets together: Not on file    Attends religious service: More than 4 times per year    Active member of club or organization: No    Attends meetings of clubs or organizations: Never    Relationship status: Married  Other Topics Concern  . Not on file  Social History Narrative  . Not on file  Allergies:  Allergies  Allergen Reactions  . Bee Pollen Anaphylaxis  . Bee Venom Anaphylaxis  . Eggs Or Egg-Derived Products Anaphylaxis  . Keflex [Cephalexin] Anaphylaxis  . Lac Bovis Anaphylaxis  . Lactase Anaphylaxis and Other (See Comments)  . Milk-Related Compounds Anaphylaxis  . Penicillins Anaphylaxis    Has patient had a PCN reaction causing immediate rash, facial/tongue/throat swelling, SOB or lightheadedness with hypotension: No Has patient had a PCN reaction causing severe rash involving mucus membranes or skin necrosis: No Has patient had a PCN reaction that required hospitalization No Has patient had a PCN reaction occurring within the last 10 years: Yes If all of the above answers are "NO", then may proceed with Cephalosporin use.   . Shellfish Allergy Anaphylaxis and Other (See Comments)  . Soy Allergy Anaphylaxis  . Wheat Bran Anaphylaxis  . Codeine     Other reaction(s): RASH  . Ferrous Gluconate Diarrhea    And vomiting And vomiting  . Hydrocodone Hives  . Iron Diarrhea  . Iodine Rash  . Latex Itching and Rash    Metabolic Disorder Labs: No results found for: HGBA1C, MPG No results found for: PROLACTIN No results found for: CHOL, TRIG, HDL, CHOLHDL, VLDL, LDLCALC Lab  Results  Component Value Date   TSH 5.752 (H) 09/19/2017   TSH 1.190 09/04/2016    Therapeutic Level Labs: No results found for: LITHIUM No results found for: VALPROATE No components found for:  CBMZ  Current Medications: Current Outpatient Medications  Medication Sig Dispense Refill  . albuterol (PROVENTIL HFA;VENTOLIN HFA) 108 (90 Base) MCG/ACT inhaler Inhale 2 puffs into the lungs every 6 (six) hours as needed for wheezing or shortness of breath. 1 Inhaler 2  . azelastine (OPTIVAR) 0.05 % ophthalmic solution Place 1 drop into both eyes daily.    . Azelastine HCl 0.15 % SOLN Place 2 sprays into the nose 2 (two) times daily.     . Biotin 10000 MCG TABS Take 10,000 mg by mouth daily.    . cetirizine (ZYRTEC) 10 MG tablet Take 2 tablets by mouth 2 (two) times daily.     . Cholecalciferol (VITAMIN D-1000 MAX ST) 1000 units tablet Take 1 tablet by mouth 2 (two) times daily.     Marland Kitchen CRANBERRY PO Take 1 capsule by mouth at bedtime.     Marland Kitchen desvenlafaxine (PRISTIQ) 50 MG 24 hr tablet Take 1 tablet (50 mg total) by mouth daily. 90 tablet 0  . EPINEPHrine 0.3 mg/0.3 mL IJ SOAJ injection Inject 0.3 mLs (0.3 mg total) into the muscle once. 1 Device 1  . fluticasone (FLONASE) 50 MCG/ACT nasal spray Place 1 spray into both nostrils 2 (two) times daily.     . Fluticasone-Salmeterol (ADVAIR) 250-50 MCG/DOSE AEPB Inhale 1 puff into the lungs 2 (two) times daily.    . hydrochlorothiazide (HYDRODIURIL) 25 MG tablet Take 25 mg by mouth daily.    . hydrOXYzine (ATARAX/VISTARIL) 50 MG tablet hydroxyzine HCl 50 mg tablet    . levocetirizine (XYZAL) 5 MG tablet Take by mouth.    . magnesium oxide (MAG-OX) 400 MG tablet Take 400 mg by mouth daily.    . montelukast (SINGULAIR) 10 MG tablet Take 10 mg by mouth at bedtime.     . Multiple Vitamins-Minerals (ZINC PO) Take 1 tablet by mouth daily.    . nitrofurantoin, macrocrystal-monohydrate, (MACROBID) 100 MG capsule Take by mouth.    Marland Kitchen ofloxacin (FLOXIN) 0.3 %  OTIC solution ofloxacin 0.3 % ear drops    .  Omalizumab (XOLAIR Lucien) Inject 1 Dose into the skin every 30 (thirty) days.    . potassium chloride (K-DUR) 10 MEQ tablet Take by mouth.    . ranitidine (ZANTAC) 150 MG tablet Take 150 mg by mouth as directed. Takes 1 in the morning 1 before each meal and 1 before bedtime    . vitamin B-12 (CYANOCOBALAMIN) 1000 MCG tablet Take 1,000 mcg by mouth daily.    . vitamin C (ASCORBIC ACID) 500 MG tablet Take 500 mg by mouth at bedtime.     No current facility-administered medications for this visit.      Musculoskeletal: Strength & Muscle Tone: within normal limits Gait & Station: normal Patient leans: N/A  Psychiatric Specialty Exam: Review of Systems  Psychiatric/Behavioral: The patient is nervous/anxious.   All other systems reviewed and are negative.   Blood pressure 105/68, pulse 83, temperature 98.7 F (37.1 C), temperature source Oral, weight 173 lb 6.4 oz (78.7 kg), last menstrual period 07/17/2016.Body mass index is 31.72 kg/m.  General Appearance: Casual  Eye Contact:  Fair  Speech:  Clear and Coherent  Volume:  Normal  Mood:  Anxious  Affect:  Appropriate  Thought Process:  Goal Directed and Descriptions of Associations: Intact  Orientation:  Full (Time, Place, and Person)  Thought Content: Logical   Suicidal Thoughts:  No  Homicidal Thoughts:  No  Memory:  Immediate;   Fair Recent;   Fair Remote;   Fair  Judgement:  Fair  Insight:  Fair  Psychomotor Activity:  Normal  Concentration:  Concentration: Fair and Attention Span: Fair  Recall:  [employer] of Knowledge: Fair  Language: Fair  Akathisia:  No  Handed:  Right  AIMS (if indicated): NA  Assets:  Communication Skills Desire for Improvement Housing  ADL's:  Intact  Cognition: WNL  Sleep:  Fair   Screenings:   Assessment and Plan: Angelyna is a 47 year old female who has a history of anxiety, ADHD, seasonal affective disorder, multiple allergies like chronic  allergies with urticaria/anaphylaxis, recent diagnosis of basal cell carcinoma, history of elevated TSH, presented to the clinic today for a follow-up visit.  Patient continues to struggle with lethargy, fatigue and so on.  Patient however is on polypharmacy as well as does have a history of elevated TSH which is currently being worked up by her primary medical doctor.  Patient will continue psychotherapy visits with Ms. Peacock on a frequent basis.  Will not make any medication changes today.  Plan as noted below.  Plan GAD Patient with history of paradoxical reaction to multiple medications in the past. We will continue Pristiq 50 mg p.o. daily which she is tolerating well. She will continue CBT with Ms. Peacock on a more frequent basis.  History of ADHD We will refer her for testing with Dr. Lynann Beaver  For specific phobias/needles She will continue CBT  For elevated TSH She reports she is on an increased dose of thyroid medication at this time.  She will continue to follow-up with her PMD.  Insomnia Discussed sleep hygiene techniques.  She will continue the same.  Follow-up in clinic in 1 month or sooner if needed.  Discussed with patient that writer will not be in clinic parts of July.  Discussed with patient to follow-up with writer prior to July or to call in for additional refills if she is unable to make the appointment since she is scheduled for surgery.  More than 50 % of the time was spent for psychoeducation  and supportive psychotherapy and care coordination.  This note was generated in part or whole with voice recognition software. Voice recognition is usually quite accurate but there are transcription errors that can and very often do occur. I apologize for any typographical errors that were not detected and corrected.         Ursula Alert, MD 04/14/2018, 4:01 PM

## 2018-04-22 DIAGNOSIS — Z01818 Encounter for other preprocedural examination: Secondary | ICD-10-CM | POA: Diagnosis not present

## 2018-04-22 DIAGNOSIS — Z889 Allergy status to unspecified drugs, medicaments and biological substances status: Secondary | ICD-10-CM | POA: Diagnosis not present

## 2018-04-22 DIAGNOSIS — E039 Hypothyroidism, unspecified: Secondary | ICD-10-CM | POA: Diagnosis not present

## 2018-04-25 DIAGNOSIS — L501 Idiopathic urticaria: Secondary | ICD-10-CM | POA: Diagnosis not present

## 2018-04-27 DIAGNOSIS — B9689 Other specified bacterial agents as the cause of diseases classified elsewhere: Secondary | ICD-10-CM | POA: Diagnosis not present

## 2018-04-27 DIAGNOSIS — J019 Acute sinusitis, unspecified: Secondary | ICD-10-CM | POA: Diagnosis not present

## 2018-04-28 ENCOUNTER — Ambulatory Visit: Payer: Self-pay | Admitting: Licensed Clinical Social Worker

## 2018-04-28 DIAGNOSIS — M722 Plantar fascial fibromatosis: Secondary | ICD-10-CM | POA: Diagnosis not present

## 2018-05-05 ENCOUNTER — Ambulatory Visit: Payer: 59 | Admitting: Licensed Clinical Social Worker

## 2018-05-05 IMAGING — US US EXTREM  UP VENOUS*L*
1 series · 13 of 24 positions shown · non-contrast
Comparison: None.

CLINICAL DATA: Erythema and pain x2 weeks post bladder surgery. Had
an IV in the area pain.



[Series 1: us extrem up venous*left* · 0.08mm/px · 13 of 34 slices shown]
[im 1/34]
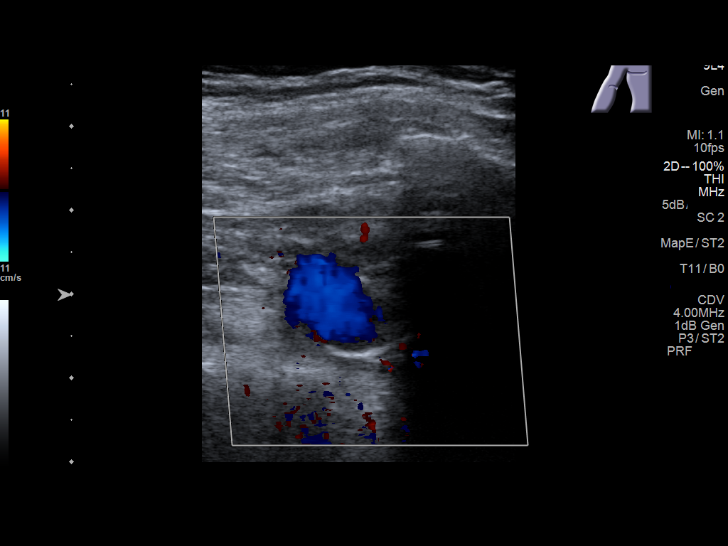
[im 3/34]
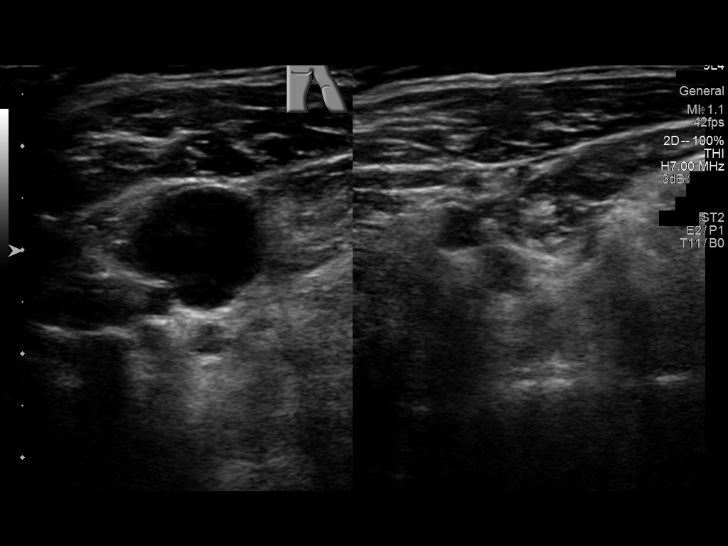
[im 6/34]
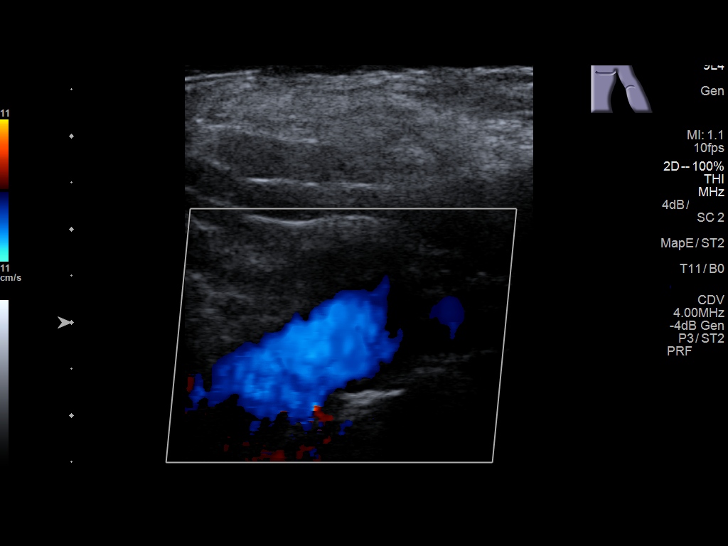
[im 9/34]
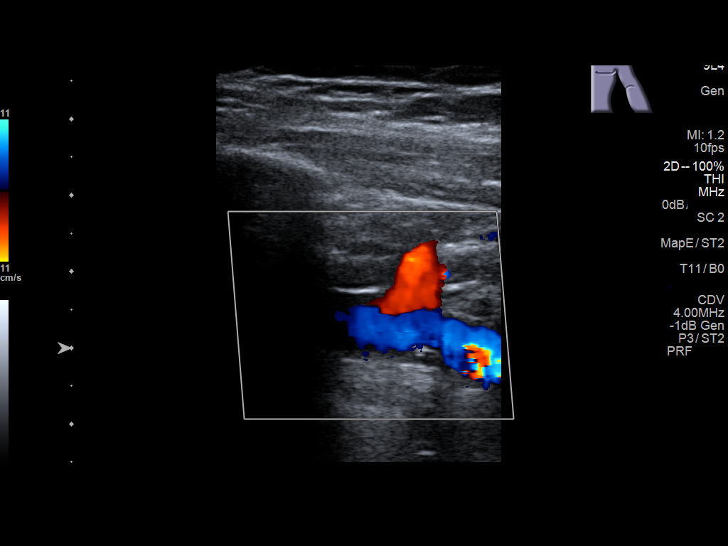
[im 12/34]
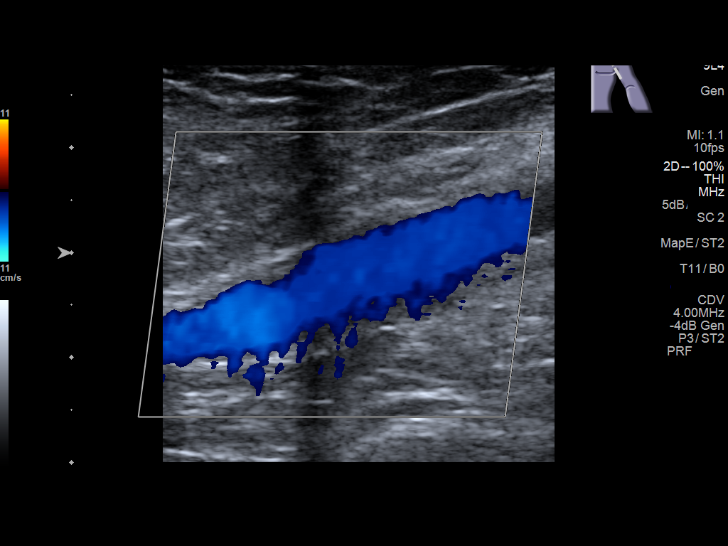
[im 15/34]
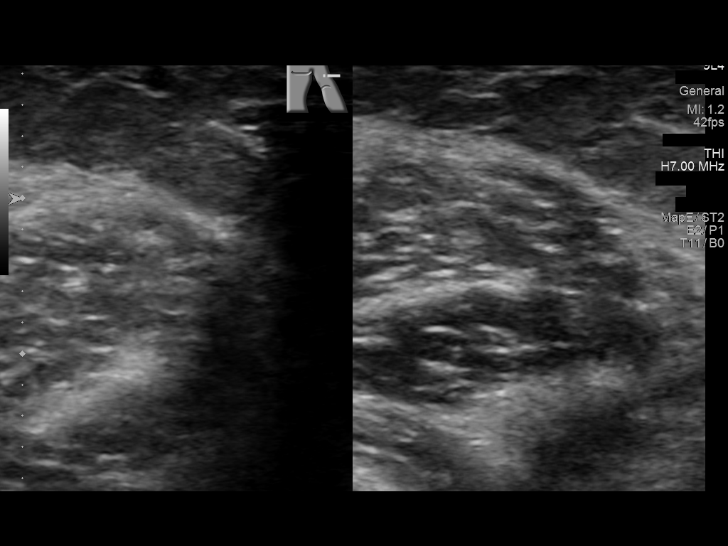
[im 18/34]
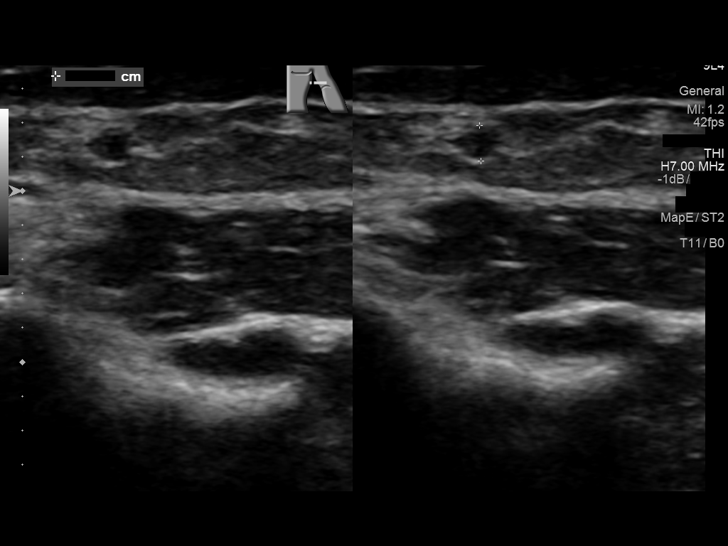
[im 19/34]
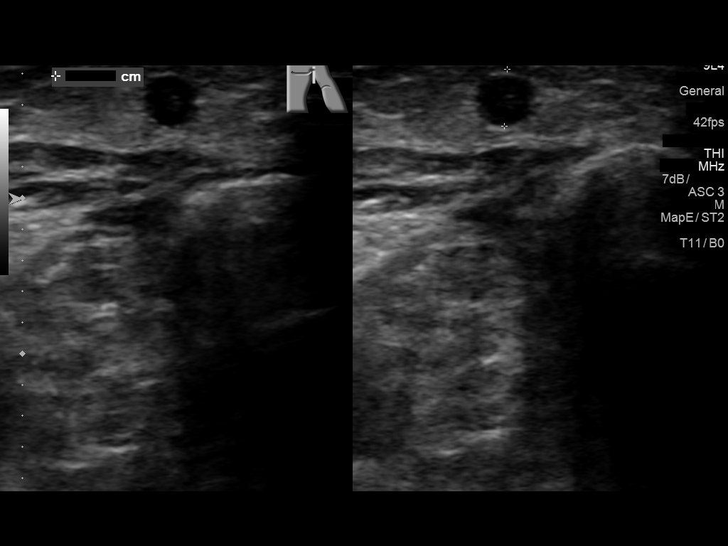
[im 22/34]
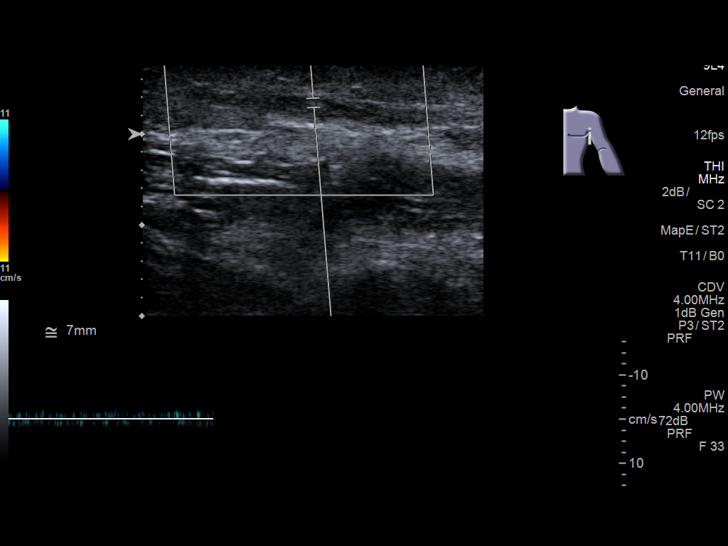
[im 25/34]
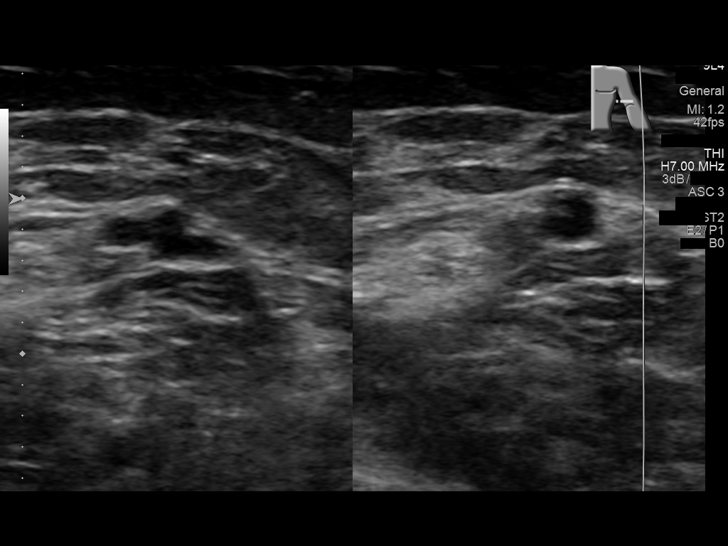
[im 28/34]
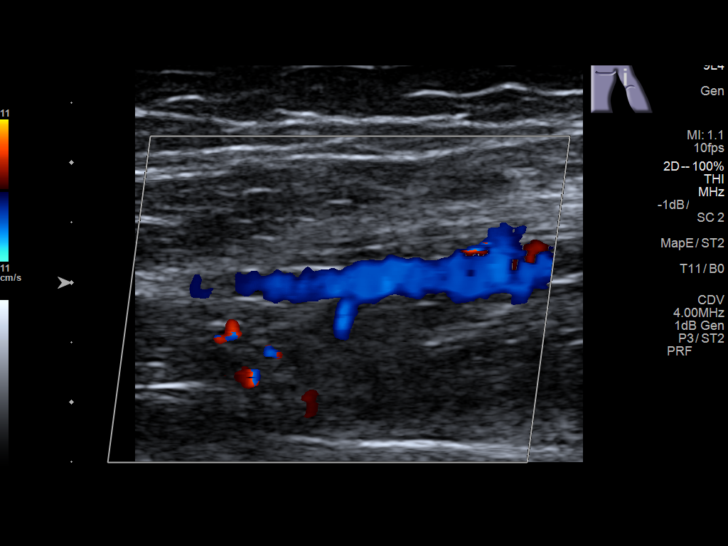
[im 31/34]
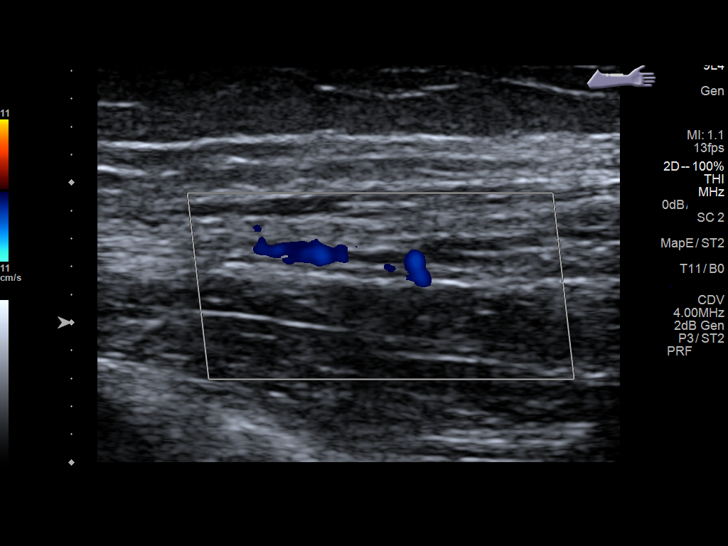
[im 34/34]
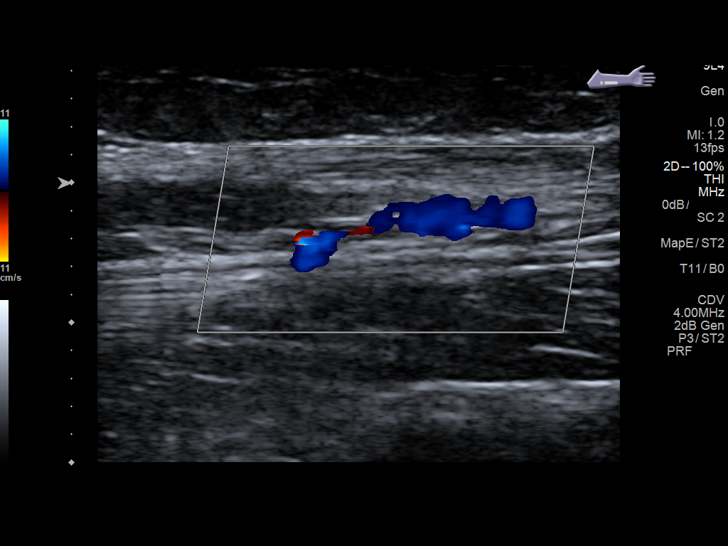

[13 of 24 positions shown; findings below may reference images not displayed]

FINDINGS: Contralateral Subclavian Vein: Respiratory phasicity is normal and
symmetric with the symptomatic side. No evidence of thrombus. Normal
compressibility.

Internal Jugular Vein: No evidence of thrombus. Normal
compressibility, respiratory phasicity and response to augmentation.

Subclavian Vein: No evidence of thrombus. Normal compressibility,
respiratory phasicity and response to augmentation.

Axillary Vein: No evidence of thrombus. Normal compressibility,
respiratory phasicity and response to augmentation.

Cephalic Vein: Limited visualization due to diminutive size. New No
evidence of thrombus. Normal compressibility, respiratory phasicity
and response to augmentation.

Basilic Vein: Positive for occlusive thrombus thrombus. No
compressibility nor respiratory phasicit.

Brachial Veins: No evidence of thrombus. Normal compressibility,
respiratory phasicity and response to augmentation.

Radial Veins: No evidence of thrombus. Normal compressibility,
respiratory phasicity and response to augmentation.

Ulnar Veins: No evidence of thrombus. Normal compressibility,
respiratory phasicity and response to augmentation.

Venous Reflux:  NA

Other Findings:  None visualized.
IMPRESSION: Occlusive thrombus of the left basilic vein beginning in the distal
upper arm and continuing into the forearm to site of IV involving
the dorsum of the hand. Critical Value/emergent results were called
by telephone at the time of interpretation on 11/06/2016 at [DATE]
to Dr. Levin, who verbally acknowledged these results.

## 2018-05-07 DIAGNOSIS — L501 Idiopathic urticaria: Secondary | ICD-10-CM | POA: Diagnosis not present

## 2018-05-08 DIAGNOSIS — L578 Other skin changes due to chronic exposure to nonionizing radiation: Secondary | ICD-10-CM | POA: Diagnosis not present

## 2018-05-08 DIAGNOSIS — C441192 Basal cell carcinoma of skin of left lower eyelid, including canthus: Secondary | ICD-10-CM | POA: Diagnosis not present

## 2018-05-08 DIAGNOSIS — L988 Other specified disorders of the skin and subcutaneous tissue: Secondary | ICD-10-CM | POA: Diagnosis not present

## 2018-05-09 DIAGNOSIS — C44111 Basal cell carcinoma of skin of unspecified eyelid, including canthus: Secondary | ICD-10-CM | POA: Diagnosis not present

## 2018-05-09 DIAGNOSIS — J45909 Unspecified asthma, uncomplicated: Secondary | ICD-10-CM | POA: Diagnosis not present

## 2018-05-09 DIAGNOSIS — C441192 Basal cell carcinoma of skin of left lower eyelid, including canthus: Secondary | ICD-10-CM | POA: Diagnosis not present

## 2018-05-12 ENCOUNTER — Other Ambulatory Visit: Payer: Self-pay

## 2018-05-12 ENCOUNTER — Ambulatory Visit (INDEPENDENT_AMBULATORY_CARE_PROVIDER_SITE_OTHER): Payer: 59 | Admitting: Psychiatry

## 2018-05-12 ENCOUNTER — Encounter: Payer: Self-pay | Admitting: Psychiatry

## 2018-05-12 VITALS — BP 111/71 | HR 73 | Temp 99.6°F | Wt 175.2 lb

## 2018-05-12 DIAGNOSIS — Z8659 Personal history of other mental and behavioral disorders: Secondary | ICD-10-CM

## 2018-05-12 DIAGNOSIS — F411 Generalized anxiety disorder: Secondary | ICD-10-CM

## 2018-05-12 DIAGNOSIS — M722 Plantar fascial fibromatosis: Secondary | ICD-10-CM | POA: Diagnosis not present

## 2018-05-12 DIAGNOSIS — M79674 Pain in right toe(s): Secondary | ICD-10-CM | POA: Diagnosis not present

## 2018-05-12 DIAGNOSIS — B351 Tinea unguium: Secondary | ICD-10-CM | POA: Diagnosis not present

## 2018-05-12 MED ORDER — DESVENLAFAXINE SUCCINATE ER 50 MG PO TB24: 50.0000 mg | ORAL_TABLET | Freq: Every day | ORAL | 1 refills | Status: DC

## 2018-05-12 NOTE — Progress Notes (Signed)
[facility] MD OP Progress Note  05/12/2018 3:18 PM Kristine [name]  MRN:  [id number]  Chief Complaint: ' I am here for follow up." Chief Complaint    Follow-up; Medication Refill     HPI: Kristine [name] is a 47 year old female who is married, employed, lives in [place], has a history of multiple medical problems like ADD, anxiety disorder, seasonal affective disorder, bipolar disorder, presented to the clinic today for a follow-up visit.  Patient also has multiple medical problems including multiple allergies, VSD, chronic urticaria/anaphylaxis, elevated TSH, migraine headache, GERD, basal cell carcinoma and so on.  Patient today reports she had her skin surgery done.  She reports she is recovering well.  She continues to have scar on the left side of her face.  Patient denies any pain today.  She reports she went through worsening anxiety symptoms while she waited for the surgery and during the process.  Patient however today reports she is better.  Patient continues to tolerate the Pristiq well.  She denies any side effects.  She reports she wants to stay on the same dosage and does not want any medication readjustment today.  Patient reports she could not get ADHD testing as discussed last visit.  She reports the provider whom she called is not accepting new patients.  Provided her with information for [facility] in [place].  Discussed with her that the referral can be sent to them.  Patient denies any suicidality.  Patient denies any perceptual disturbances.  Patient reports she continues to struggle with thyroid abnormalities and her primary medical doctor is currently managing the same for her. Visit Diagnosis:    ICD-10-CM   1. GAD (generalized anxiety disorder) F41.1 desvenlafaxine (PRISTIQ) 50 MG 24 hr tablet  2. History of ADHD Z86.59     Past Psychiatric History: Trials of Klonopin-disinhibited, gabapentin-prescribed for pain made her crazy, Ambien-sleepwalking,  Wellbutrin-paradoxical reaction, Zoloft-side effects.  I have reviewed past psychiatric history from my progress note on 03/03/2018.  Past Medical History:  Past Medical History:  Diagnosis Date  . Abnormal weight gain 08/05/2013  . Absence of interventricular septum 08/05/2013  . Allergic rhinitis 04/17/2016  . Allergic urticaria due to ingested food 02/09/2016  . Anxiety   . Asthma   . Clinical depression 09/30/2013  . Complication of anesthesia    PT HAD TROUBLE BREATHING AFTER HYSTERECTOMY IN OCTOBER  . Depression   . Dizziness   . Excess weight 09/30/2013  . GERD (gastroesophageal reflux disease)   . Headache, migraine 09/30/2013  . Heart murmur   . Heavy drinker 04/11/2014   Overview:  Patient reports drinking 2-3 bottles of wine each weekend.    . History of methicillin resistant staphylococcus aureus (MRSA) 11/2015  . Infection of urinary tract 09/30/2013  . Irregular bleeding 09/30/2013  . Multiple allergies   . VSD (ventricular septal defect and aortic arch hypoplasia     Past Surgical History:  Procedure Laterality Date  . BOTOX INJECTION N/A 10/23/2016   Procedure: BOTOX INJECTION;  Surgeon: Royston Cowper, MD;  Location: [facility];  Service: Urology;  Laterality: N/A;  . BREAST BIOPSY Left 11/13/13   benign  . CESAREAN SECTION    . COLONOSCOPY    . CORONARY ANGIOPLASTY    . CYSTOSCOPY N/A 10/23/2016   Procedure: CYSTOSCOPY;  Surgeon: Royston Cowper, MD;  Location: [facility];  Service: Urology;  Laterality: N/A;  . CYSTOSCOPY WITH HYDRODISTENSION AND BIOPSY    . LAPAROSCOPIC ASSISTED VAGINAL HYSTERECTOMY N/A 08/20/2016  Procedure: LAPAROSCOPIC ASSISTED VAGINAL HYSTERECTOMY;  Surgeon: Boykin Nearing, MD;  Location: [facility];  Service: Gynecology;  Laterality: N/A;  . LAPAROSCOPIC BILATERAL SALPINGECTOMY Bilateral 08/20/2016   Procedure: LAPAROSCOPIC BILATERAL SALPINGECTOMY;  Surgeon: Boykin Nearing, MD;  Location: [facility];  Service: Gynecology;  Laterality:  Bilateral;  . NASAL SINUS SURGERY     x12    Family Psychiatric History: Reviewed family psychiatric history from my progress note on 03/03/2018  Family History:  Family History  Problem Relation Age of Onset  . Breast cancer Maternal Aunt   . Breast cancer Paternal Aunt   . Breast cancer Maternal Grandmother   . Breast cancer Paternal Grandmother   . Breast cancer Paternal Aunt   . Bipolar disorder Paternal Aunt   . Kidney cancer Maternal Uncle   . Prostate cancer Paternal Grandfather   . Kidney disease Unknown        father's side  . Aneurysm Mother   . Liver disease Mother   . Drug abuse Mother   . Alcohol abuse Mother   . Anxiety disorder Mother   . Depression Mother   . Cancer Father   . Anxiety disorder Father   . Depression Father    Substance abuse history: Denies  Social History: Reviewed social history from my progress note on 03/03/2018. Social History   Socioeconomic History  . Marital status: Married    Spouse name: greg   . Number of children: 2  . Years of education: Not on file  . Highest education level: Associate degree: occupational, Hotel manager, or vocational program  Occupational History  . Not on file  Social Needs  . Financial resource strain: Not hard at all  . Food insecurity:    Worry: Never true    Inability: Never true  . Transportation needs:    Medical: No    Non-medical: No  Tobacco Use  . Smoking status: Former Smoker    Types: Cigarettes    Last attempt to quit: 11/12/2006    Years since quitting: 11.5  . Smokeless tobacco: Never Used  . Tobacco comment: quit 2008  Substance and Sexual Activity  . Alcohol use: No    Alcohol/week: 0.0 oz  . Drug use: No  . Sexual activity: Yes    Birth control/protection: None  Lifestyle  . Physical activity:    Days per week: 0 days    Minutes per session: 0 min  . Stress: Very much  Relationships  . Social connections:    Talks on phone: Not on file    Gets together: Not on file     Attends religious service: More than 4 times per year    Active member of club or organization: No    Attends meetings of clubs or organizations: Never    Relationship status: Married  Other Topics Concern  . Not on file  Social History Narrative  . Not on file    Allergies:  Allergies  Allergen Reactions  . Bee Pollen Anaphylaxis  . Bee Venom Anaphylaxis  . Eggs Or Egg-Derived Products Anaphylaxis  . Keflex [Cephalexin] Anaphylaxis  . Lac Bovis Anaphylaxis  . Lactase Anaphylaxis and Other (See Comments)  . Milk-Related Compounds Anaphylaxis  . Penicillins Anaphylaxis    Has patient had a PCN reaction causing immediate rash, facial/tongue/throat swelling, SOB or lightheadedness with hypotension: No Has patient had a PCN reaction causing severe rash involving mucus membranes or skin necrosis: No Has patient had a PCN reaction that required hospitalization No  Has patient had a PCN reaction occurring within the last 10 years: Yes If all of the above answers are "NO", then may proceed with Cephalosporin use.   . Shellfish Allergy Anaphylaxis and Other (See Comments)  . Soy Allergy Anaphylaxis  . Wheat Bran Anaphylaxis  . Codeine     Other reaction(s): RASH  . Ferrous Gluconate Diarrhea    And vomiting And vomiting  . Hydrocodone Hives  . Iron Diarrhea  . Iodine Rash  . Latex Itching and Rash    Metabolic Disorder Labs: No results found for: HGBA1C, MPG No results found for: PROLACTIN No results found for: CHOL, TRIG, HDL, CHOLHDL, VLDL, LDLCALC Lab Results  Component Value Date   TSH 5.752 (H) 09/19/2017   TSH 1.190 09/04/2016    Therapeutic Level Labs: No results found for: LITHIUM No results found for: VALPROATE No components found for:  CBMZ  Current Medications: Current Outpatient Medications  Medication Sig Dispense Refill  . albuterol (PROVENTIL HFA;VENTOLIN HFA) 108 (90 Base) MCG/ACT inhaler Inhale 2 puffs into the lungs every 6 (six) hours as needed for  wheezing or shortness of breath. 1 Inhaler 2  . azelastine (OPTIVAR) 0.05 % ophthalmic solution Place 1 drop into both eyes daily.    . Azelastine HCl 0.15 % SOLN Place 2 sprays into the nose 2 (two) times daily.     . Biotin 10000 MCG TABS Take 10,000 mg by mouth daily.    . cetirizine (ZYRTEC) 10 MG tablet Take 2 tablets by mouth 2 (two) times daily.     . Cholecalciferol (VITAMIN D-1000 MAX ST) 1000 units tablet Take 1 tablet by mouth 2 (two) times daily.     Marland Kitchen CRANBERRY PO Take 1 capsule by mouth at bedtime.     Marland Kitchen desvenlafaxine (PRISTIQ) 50 MG 24 hr tablet Take 1 tablet (50 mg total) by mouth daily. 30 tablet 1  . EPINEPHrine 0.3 mg/0.3 mL IJ SOAJ injection Inject 0.3 mLs (0.3 mg total) into the muscle once. 1 Device 1  . erythromycin ophthalmic ointment Use 1/4" ribbon to affected eye & sutures if present 2x/day for 14 days. If you develop allergy symptoms, stop & call our office.    . fluticasone (FLONASE) 50 MCG/ACT nasal spray Place 1 spray into both nostrils 2 (two) times daily.     . fluticasone (FLOVENT HFA) 220 MCG/ACT inhaler INHALE 2 PUFFS BY MOUTH TWICE DAILY    . Fluticasone-Salmeterol (ADVAIR) 250-50 MCG/DOSE AEPB Inhale 1 puff into the lungs 2 (two) times daily.    . hydrochlorothiazide (HYDRODIURIL) 25 MG tablet Take 25 mg by mouth daily.    . hydrOXYzine (ATARAX/VISTARIL) 50 MG tablet hydroxyzine HCl 50 mg tablet    . levocetirizine (XYZAL) 5 MG tablet Take by mouth.    . levothyroxine (SYNTHROID, LEVOTHROID) 50 MCG tablet Take 50 mcg daily, any generic manufacturer is okay but try to stay with same manufacturer as much as possible    . magnesium oxide (MAG-OX) 400 MG tablet Take 400 mg by mouth daily.    . montelukast (SINGULAIR) 10 MG tablet Take 10 mg by mouth at bedtime.     . Multiple Vitamins-Minerals (ZINC PO) Take 1 tablet by mouth daily.    . nitrofurantoin, macrocrystal-monohydrate, (MACROBID) 100 MG capsule Take by mouth.    Marland Kitchen ofloxacin (FLOXIN) 0.3 % OTIC  solution ofloxacin 0.3 % ear drops    . Omalizumab (XOLAIR [place]) Inject 1 Dose into the skin every 30 (thirty) days.    Marland Kitchen  potassium chloride (K-DUR) 10 MEQ tablet Take by mouth.    . ranitidine (ZANTAC) 150 MG tablet Take 150 mg by mouth as directed. Takes 1 in the morning 1 before each meal and 1 before bedtime    . vitamin B-12 (CYANOCOBALAMIN) 1000 MCG tablet Take 1,000 mcg by mouth daily.    . vitamin C (ASCORBIC ACID) 500 MG tablet Take 500 mg by mouth at bedtime.    Marland Kitchen doxycycline (MONODOX) 100 MG capsule Take 100 mg by mouth 2 (two) times daily.  0  . metoCLOPramide (REGLAN) 5 MG tablet TAKE 1 TABLET BY MOUTH EVERY 6 HOURS AS NEEDED FOR MIGRAINE HEADACHE  1   No current facility-administered medications for this visit.      Musculoskeletal: Strength & Muscle Tone: within normal limits Gait & Station: normal Patient leans: N/A  Psychiatric Specialty Exam: Review of Systems  Psychiatric/Behavioral: The patient is nervous/anxious.   All other systems reviewed and are negative.   Blood pressure 111/71, pulse 73, temperature 99.6 F (37.6 C), temperature source Oral, weight 175 lb 3.2 oz (79.5 kg), last menstrual period 07/17/2016.Body mass index is 32.04 kg/m.  General Appearance: Casual  Eye Contact:  Fair  Speech:  Clear and Coherent  Volume:  Normal  Mood:  Anxious and Dysphoric, improving  Affect:  Appropriate  Thought Process:  Goal Directed and Descriptions of Associations: Intact  Orientation:  Full (Time, Place, and Person)  Thought Content: Logical   Suicidal Thoughts:  No  Homicidal Thoughts:  No  Memory:  Immediate;   Fair Recent;   Fair Remote;   Fair  Judgement:  Fair  Insight:  Fair  Psychomotor Activity:  Normal  Concentration:  Concentration: Fair and Attention Span: Fair  Recall:  [employer] of Knowledge: Fair  Language: Fair  Akathisia:  No  Handed:  Right  AIMS (if indicated): na  Assets:  Communication Skills Desire for Improvement Social  Support  ADL's:  Intact  Cognition: WNL  Sleep:  Fair   Screenings:   Assessment and Plan: Peighton is a 47 year old female who has a history of anxiety, ADHD, seasonal affective disorder, multiple allergies like chronic allergies with urticaria/anaphylaxis, recent diagnosis of basal cell carcinoma, history of elevated TSH, presented to the clinic today for a follow-up visit.  Patient reports her mood symptoms is improving on the current medication regimen.  She continues to struggle with abnormal TSH as well as a recent skin procedure which she had, currently recovering.  Patient will continue psychotherapy with Ms. Peacock.  Plan as noted below.  Plan GAD Patient with history of paradoxical reaction to multiple medications in the past. Continue Pristiq 50 mg p.o. daily which is tolerating well. Continue CBT with Ms. Peacock.  History of ADHD We will send a referral to [place] [facility].  Also provided her with information to call them.  Follow-up in clinic in 6 weeks.  More than 50 % of the time was spent for psychoeducation and supportive psychotherapy and care coordination.  This note was generated in part or whole with voice recognition software. Voice recognition is usually quite accurate but there are transcription errors that can and very often do occur. I apologize for any typographical errors that were not detected and corrected.       Ursula Alert, MD 05/12/2018, 3:18 PM

## 2018-05-13 DIAGNOSIS — R5082 Postprocedural fever: Secondary | ICD-10-CM | POA: Diagnosis not present

## 2018-05-13 DIAGNOSIS — R109 Unspecified abdominal pain: Secondary | ICD-10-CM | POA: Diagnosis not present

## 2018-05-17 NOTE — Progress Notes (Signed)
   THERAPIST PROGRESS NOTE  Session Time: 60MIN  Participation Level: Active  Behavioral Response: CasualAlertEuthymic  Type of Therapy: Individual Therapy  Treatment Goals addressed: Anxiety  Interventions: CBT and Motivational Interviewing  Summary: Kristine [name] is a 47 y.o. female who presents with continued symptoms of her diagnosis. Therapist met with Patient in an initial therapy session to assess current mood and to build rapport. Therapist engaged Patient in discussion about her life and what is going well for her. Therapist provided support for Patient as she shared details about her life, her current stressors, mood, coping skills, her past, and her children. Therapist prompted Patient to discuss her support system and ways that she manages her daily stress, anger, and frustrations. LCSW discussed what psychotherapy is and is not and the importance of the therapeutic relationship to include open and honest communication between client and therapist and building trust.  Reviewed advantages and disadvantages of the therapeutic process and limitations to the therapeutic relationship including LCSW's role in maintaining the safety of the client, others and those in client's care.  Suicidal/Homicidal: No  Plan: Return again in 2 weeks.  Diagnosis: Axis I: Generalized Anxiety Disorder    Axis II: No diagnosis    Lubertha South, LCSW 04/15/2018

## 2018-05-19 ENCOUNTER — Ambulatory Visit: Payer: 59 | Admitting: Licensed Clinical Social Worker

## 2018-05-19 DIAGNOSIS — L501 Idiopathic urticaria: Secondary | ICD-10-CM | POA: Diagnosis not present

## 2018-06-02 ENCOUNTER — Ambulatory Visit: Payer: Self-pay | Admitting: Licensed Clinical Social Worker

## 2018-06-02 DIAGNOSIS — M7989 Other specified soft tissue disorders: Secondary | ICD-10-CM | POA: Diagnosis not present

## 2018-06-02 DIAGNOSIS — M9901 Segmental and somatic dysfunction of cervical region: Secondary | ICD-10-CM | POA: Diagnosis not present

## 2018-06-02 DIAGNOSIS — M722 Plantar fascial fibromatosis: Secondary | ICD-10-CM | POA: Diagnosis not present

## 2018-06-02 DIAGNOSIS — M9905 Segmental and somatic dysfunction of pelvic region: Secondary | ICD-10-CM | POA: Diagnosis not present

## 2018-06-02 DIAGNOSIS — R51 Headache: Secondary | ICD-10-CM | POA: Diagnosis not present

## 2018-06-03 DIAGNOSIS — M722 Plantar fascial fibromatosis: Secondary | ICD-10-CM | POA: Diagnosis not present

## 2018-06-03 DIAGNOSIS — M79671 Pain in right foot: Secondary | ICD-10-CM | POA: Diagnosis not present

## 2018-06-03 DIAGNOSIS — G5753 Tarsal tunnel syndrome, bilateral lower limbs: Secondary | ICD-10-CM | POA: Diagnosis not present

## 2018-06-04 DIAGNOSIS — R600 Localized edema: Secondary | ICD-10-CM | POA: Diagnosis not present

## 2018-06-04 DIAGNOSIS — Q210 Ventricular septal defect: Secondary | ICD-10-CM | POA: Diagnosis not present

## 2018-06-04 DIAGNOSIS — M9905 Segmental and somatic dysfunction of pelvic region: Secondary | ICD-10-CM | POA: Diagnosis not present

## 2018-06-04 DIAGNOSIS — R0602 Shortness of breath: Secondary | ICD-10-CM | POA: Diagnosis not present

## 2018-06-04 DIAGNOSIS — M9901 Segmental and somatic dysfunction of cervical region: Secondary | ICD-10-CM | POA: Diagnosis not present

## 2018-06-04 DIAGNOSIS — E039 Hypothyroidism, unspecified: Secondary | ICD-10-CM | POA: Diagnosis not present

## 2018-06-04 DIAGNOSIS — R51 Headache: Secondary | ICD-10-CM | POA: Diagnosis not present

## 2018-06-04 DIAGNOSIS — R0609 Other forms of dyspnea: Secondary | ICD-10-CM | POA: Diagnosis not present

## 2018-06-06 DIAGNOSIS — M9901 Segmental and somatic dysfunction of cervical region: Secondary | ICD-10-CM | POA: Diagnosis not present

## 2018-06-06 DIAGNOSIS — M9905 Segmental and somatic dysfunction of pelvic region: Secondary | ICD-10-CM | POA: Diagnosis not present

## 2018-06-06 DIAGNOSIS — R51 Headache: Secondary | ICD-10-CM | POA: Diagnosis not present

## 2018-06-06 DIAGNOSIS — L501 Idiopathic urticaria: Secondary | ICD-10-CM | POA: Diagnosis not present

## 2018-06-09 DIAGNOSIS — Z79899 Other long term (current) drug therapy: Secondary | ICD-10-CM | POA: Diagnosis not present

## 2018-06-11 DIAGNOSIS — R51 Headache: Secondary | ICD-10-CM | POA: Diagnosis not present

## 2018-06-11 DIAGNOSIS — M9901 Segmental and somatic dysfunction of cervical region: Secondary | ICD-10-CM | POA: Diagnosis not present

## 2018-06-11 DIAGNOSIS — M9905 Segmental and somatic dysfunction of pelvic region: Secondary | ICD-10-CM | POA: Diagnosis not present

## 2018-06-13 DIAGNOSIS — M722 Plantar fascial fibromatosis: Secondary | ICD-10-CM | POA: Diagnosis not present

## 2018-06-13 DIAGNOSIS — R51 Headache: Secondary | ICD-10-CM | POA: Diagnosis not present

## 2018-06-13 DIAGNOSIS — M9905 Segmental and somatic dysfunction of pelvic region: Secondary | ICD-10-CM | POA: Diagnosis not present

## 2018-06-13 DIAGNOSIS — M9901 Segmental and somatic dysfunction of cervical region: Secondary | ICD-10-CM | POA: Diagnosis not present

## 2018-06-16 DIAGNOSIS — Q210 Ventricular septal defect: Secondary | ICD-10-CM | POA: Diagnosis not present

## 2018-06-16 DIAGNOSIS — M722 Plantar fascial fibromatosis: Secondary | ICD-10-CM | POA: Diagnosis not present

## 2018-06-17 DIAGNOSIS — L508 Other urticaria: Secondary | ICD-10-CM | POA: Diagnosis not present

## 2018-06-17 DIAGNOSIS — J4540 Moderate persistent asthma, uncomplicated: Secondary | ICD-10-CM | POA: Diagnosis not present

## 2018-06-17 DIAGNOSIS — J310 Chronic rhinitis: Secondary | ICD-10-CM | POA: Diagnosis not present

## 2018-06-17 DIAGNOSIS — L501 Idiopathic urticaria: Secondary | ICD-10-CM | POA: Diagnosis not present

## 2018-06-18 DIAGNOSIS — R5383 Other fatigue: Secondary | ICD-10-CM | POA: Diagnosis not present

## 2018-06-18 DIAGNOSIS — R7989 Other specified abnormal findings of blood chemistry: Secondary | ICD-10-CM | POA: Diagnosis not present

## 2018-06-18 DIAGNOSIS — R635 Abnormal weight gain: Secondary | ICD-10-CM | POA: Diagnosis not present

## 2018-06-18 DIAGNOSIS — E669 Obesity, unspecified: Secondary | ICD-10-CM | POA: Diagnosis not present

## 2018-06-18 DIAGNOSIS — R51 Headache: Secondary | ICD-10-CM | POA: Diagnosis not present

## 2018-06-18 DIAGNOSIS — E039 Hypothyroidism, unspecified: Secondary | ICD-10-CM | POA: Diagnosis not present

## 2018-06-18 DIAGNOSIS — M9905 Segmental and somatic dysfunction of pelvic region: Secondary | ICD-10-CM | POA: Diagnosis not present

## 2018-06-18 DIAGNOSIS — M9901 Segmental and somatic dysfunction of cervical region: Secondary | ICD-10-CM | POA: Diagnosis not present

## 2018-06-19 DIAGNOSIS — G5753 Tarsal tunnel syndrome, bilateral lower limbs: Secondary | ICD-10-CM | POA: Diagnosis not present

## 2018-06-19 DIAGNOSIS — Z79899 Other long term (current) drug therapy: Secondary | ICD-10-CM | POA: Diagnosis not present

## 2018-06-19 DIAGNOSIS — M722 Plantar fascial fibromatosis: Secondary | ICD-10-CM | POA: Diagnosis not present

## 2018-06-19 DIAGNOSIS — B351 Tinea unguium: Secondary | ICD-10-CM | POA: Diagnosis not present

## 2018-06-20 DIAGNOSIS — M9905 Segmental and somatic dysfunction of pelvic region: Secondary | ICD-10-CM | POA: Diagnosis not present

## 2018-06-20 DIAGNOSIS — R51 Headache: Secondary | ICD-10-CM | POA: Diagnosis not present

## 2018-06-20 DIAGNOSIS — M9901 Segmental and somatic dysfunction of cervical region: Secondary | ICD-10-CM | POA: Diagnosis not present

## 2018-06-20 DIAGNOSIS — M722 Plantar fascial fibromatosis: Secondary | ICD-10-CM | POA: Diagnosis not present

## 2018-06-23 ENCOUNTER — Ambulatory Visit (INDEPENDENT_AMBULATORY_CARE_PROVIDER_SITE_OTHER): Payer: 59 | Admitting: Psychiatry

## 2018-06-23 ENCOUNTER — Other Ambulatory Visit: Payer: Self-pay

## 2018-06-23 ENCOUNTER — Ambulatory Visit: Payer: 59 | Admitting: Licensed Clinical Social Worker

## 2018-06-23 ENCOUNTER — Encounter

## 2018-06-23 ENCOUNTER — Encounter: Payer: Self-pay | Admitting: Psychiatry

## 2018-06-23 VITALS — BP 134/79 | HR 78 | Temp 97.8°F | Wt 178.6 lb

## 2018-06-23 DIAGNOSIS — M9901 Segmental and somatic dysfunction of cervical region: Secondary | ICD-10-CM | POA: Diagnosis not present

## 2018-06-23 DIAGNOSIS — M9905 Segmental and somatic dysfunction of pelvic region: Secondary | ICD-10-CM | POA: Diagnosis not present

## 2018-06-23 DIAGNOSIS — F411 Generalized anxiety disorder: Secondary | ICD-10-CM

## 2018-06-23 DIAGNOSIS — M722 Plantar fascial fibromatosis: Secondary | ICD-10-CM | POA: Diagnosis not present

## 2018-06-23 DIAGNOSIS — R51 Headache: Secondary | ICD-10-CM | POA: Diagnosis not present

## 2018-06-23 DIAGNOSIS — Z8659 Personal history of other mental and behavioral disorders: Secondary | ICD-10-CM

## 2018-06-23 MED ORDER — DESVENLAFAXINE SUCCINATE ER 25 MG PO TB24: 25.0000 mg | ORAL_TABLET | Freq: Every day | ORAL | 0 refills | Status: DC

## 2018-06-23 MED ORDER — TOPIRAMATE 50 MG PO TABS: 25.0000 mg | ORAL_TABLET | Freq: Every day | ORAL | 2 refills | Status: DC

## 2018-06-23 NOTE — Progress Notes (Signed)
[facility] OP Progress Note  06/23/2018 4:25 PM ANALILIA [name]  MRN:  [id number]  Chief Complaint: ' I am here for follow up." Chief Complaint    Follow-up; Medication Refill; Fussy     HPI: Kristine [name] is a 47 year old female, married, employed, lives in [place], has a history of multiple medical problems like ADD, anxiety disorder, seasonal affective disorder, bipolar disorder, presented to the clinic today for a follow-up visit.  Patient also has multiple medical problems including multiple allergies, VSD, chronic urticaria/anaphylaxis, elevated TSH, migraine headaches, GERD, basal cell carcinoma and so on.  Patient today reports she had her testing done at [facility].  She reports she was told that she does not have ADHD.  Reports the testing results will be sent to writer as per discussion.  Patient continues to struggle with anxiety symptoms and mood lability.  She reports some irritability on and off as well as outburst.  She also reports she was told that she is all over the place and she does not know if this is due to her anxiety symptoms or not.  Patient reports she is tolerating the Pristiq well and would like to increase the dosage.  She is also interested in a mood stabilizer.  Discussed several mood stabilizers based on genesight  testing with patient.  Patient however reports she would like to try something that does not cause weight gain.  Hence discussed Topamax with patient.  Patient denies any suicidality or perceptual disturbances.  Patient reports she was unable to follow-up with her therapist due to scheduling problems and hence gave her therapy resources in the community.  Patient advised to make an appointment with another therapist. Visit Diagnosis:    ICD-10-CM   1. GAD (generalized anxiety disorder) F41.1 Desvenlafaxine Succinate ER (PRISTIQ) 25 MG TB24  2. History of ADHD Z86.59     Past Psychiatric History: Reviewed past psychiatric history.   Dated 03/03/2018.  Past trials of gabapentin-prescribed for pain, Klonopin-disinhibited, Ambien-sleepwalking, Wellbutrin-paradoxical reaction, Zoloft-side effects.  Past Medical History:  Past Medical History:  Diagnosis Date  . Abnormal weight gain 08/05/2013  . Absence of interventricular septum 08/05/2013  . Allergic rhinitis 04/17/2016  . Allergic urticaria due to ingested food 02/09/2016  . Anxiety   . Asthma   . Clinical depression 09/30/2013  . Complication of anesthesia    PT HAD TROUBLE BREATHING AFTER HYSTERECTOMY IN OCTOBER  . Depression   . Dizziness   . Excess weight 09/30/2013  . GERD (gastroesophageal reflux disease)   . Headache, migraine 09/30/2013  . Heart murmur   . Heavy drinker 04/11/2014   Overview:  Patient reports drinking 2-3 bottles of wine each weekend.    . History of methicillin resistant staphylococcus aureus (MRSA) 11/2015  . Infection of urinary tract 09/30/2013  . Irregular bleeding 09/30/2013  . Multiple allergies   . VSD (ventricular septal defect and aortic arch hypoplasia     Past Surgical History:  Procedure Laterality Date  . BOTOX INJECTION N/A 10/23/2016   Procedure: BOTOX INJECTION;  Surgeon: Royston Cowper, MD;  Location: [facility];  Service: Urology;  Laterality: N/A;  . BREAST BIOPSY Left 11/13/13   benign  . CESAREAN SECTION    . COLONOSCOPY    . CORONARY ANGIOPLASTY    . CYSTOSCOPY N/A 10/23/2016   Procedure: CYSTOSCOPY;  Surgeon: Royston Cowper, MD;  Location: [facility];  Service: Urology;  Laterality: N/A;  . CYSTOSCOPY WITH HYDRODISTENSION AND BIOPSY    . LAPAROSCOPIC  ASSISTED VAGINAL HYSTERECTOMY N/A 08/20/2016   Procedure: LAPAROSCOPIC ASSISTED VAGINAL HYSTERECTOMY;  Surgeon: Boykin Nearing, MD;  Location: [facility];  Service: Gynecology;  Laterality: N/A;  . LAPAROSCOPIC BILATERAL SALPINGECTOMY Bilateral 08/20/2016   Procedure: LAPAROSCOPIC BILATERAL SALPINGECTOMY;  Surgeon: Boykin Nearing, MD;  Location: [facility];   Service: Gynecology;  Laterality: Bilateral;  . NASAL SINUS SURGERY     x12    Family Psychiatric History: I have reviewed family psychiatric history from my progress note on 03/03/2018.  Family History:  Family History  Problem Relation Age of Onset  . Breast cancer Maternal Aunt   . Breast cancer Paternal Aunt   . Breast cancer Maternal Grandmother   . Breast cancer Paternal Grandmother   . Breast cancer Paternal Aunt   . Bipolar disorder Paternal Aunt   . Kidney cancer Maternal Uncle   . Prostate cancer Paternal Grandfather   . Kidney disease Unknown        father's side  . Aneurysm Mother   . Liver disease Mother   . Drug abuse Mother   . Alcohol abuse Mother   . Anxiety disorder Mother   . Depression Mother   . Cancer Father   . Anxiety disorder Father   . Depression Father     Social History: Reviewed social history from my progress note on 03/03/2018. Social History   Socioeconomic History  . Marital status: Married    Spouse name: greg   . Number of children: 2  . Years of education: Not on file  . Highest education level: Associate degree: occupational, Hotel manager, or vocational program  Occupational History  . Not on file  Social Needs  . Financial resource strain: Not hard at all  . Food insecurity:    Worry: Never true    Inability: Never true  . Transportation needs:    Medical: No    Non-medical: No  Tobacco Use  . Smoking status: Former Smoker    Types: Cigarettes    Last attempt to quit: 11/12/2006    Years since quitting: 11.6  . Smokeless tobacco: Never Used  . Tobacco comment: quit 2008  Substance and Sexual Activity  . Alcohol use: No    Alcohol/week: 0.0 standard drinks  . Drug use: No  . Sexual activity: Yes    Birth control/protection: None  Lifestyle  . Physical activity:    Days per week: 0 days    Minutes per session: 0 min  . Stress: Very much  Relationships  . Social connections:    Talks on phone: Not on file    Gets  together: Not on file    Attends religious service: More than 4 times per year    Active member of club or organization: No    Attends meetings of clubs or organizations: Never    Relationship status: Married  Other Topics Concern  . Not on file  Social History Narrative  . Not on file    Allergies:  Allergies  Allergen Reactions  . Bee Pollen Anaphylaxis  . Bee Venom Anaphylaxis  . Eggs Or Egg-Derived Products Anaphylaxis  . Keflex [Cephalexin] Anaphylaxis  . Lac Bovis Anaphylaxis  . Lactase Anaphylaxis and Other (See Comments)  . Milk-Related Compounds Anaphylaxis  . Penicillins Anaphylaxis    Has patient had a PCN reaction causing immediate rash, facial/tongue/throat swelling, SOB or lightheadedness with hypotension: No Has patient had a PCN reaction causing severe rash involving mucus membranes or skin necrosis: No Has patient had a  PCN reaction that required hospitalization No Has patient had a PCN reaction occurring within the last 10 years: Yes If all of the above answers are "NO", then may proceed with Cephalosporin use.   . Shellfish Allergy Anaphylaxis and Other (See Comments)  . Soy Allergy Anaphylaxis  . Wheat Bran Anaphylaxis  . Codeine     Other reaction(s): RASH  . Ferrous Gluconate Diarrhea    And vomiting And vomiting  . Hydrocodone Hives  . Iron Diarrhea  . Iodine Rash  . Latex Itching and Rash    Metabolic Disorder Labs: No results found for: HGBA1C, MPG No results found for: PROLACTIN No results found for: CHOL, TRIG, HDL, CHOLHDL, VLDL, LDLCALC Lab Results  Component Value Date   TSH 5.752 (H) 09/19/2017   TSH 1.190 09/04/2016    Therapeutic Level Labs: No results found for: LITHIUM No results found for: VALPROATE No components found for:  CBMZ  Current Medications: Current Outpatient Medications  Medication Sig Dispense Refill  . albuterol (PROVENTIL HFA;VENTOLIN HFA) 108 (90 Base) MCG/ACT inhaler Inhale 2 puffs into the lungs every 6  (six) hours as needed for wheezing or shortness of breath. 1 Inhaler 2  . azelastine (OPTIVAR) 0.05 % ophthalmic solution Place 1 drop into both eyes daily.    . Azelastine HCl 0.15 % SOLN Place 2 sprays into the nose 2 (two) times daily.     . Biotin 10000 MCG TABS Take 10,000 mg by mouth daily.    . cetirizine (ZYRTEC) 10 MG tablet Take 2 tablets by mouth 2 (two) times daily.     . Cholecalciferol (VITAMIN D-1000 MAX ST) 1000 units tablet Take 1 tablet by mouth 2 (two) times daily.     Marland Kitchen CRANBERRY PO Take 1 capsule by mouth at bedtime.     Marland Kitchen doxycycline (MONODOX) 100 MG capsule Take 100 mg by mouth 2 (two) times daily.  0  . EPINEPHrine 0.3 mg/0.3 mL IJ SOAJ injection Inject 0.3 mLs (0.3 mg total) into the muscle once. 1 Device 1  . erythromycin ophthalmic ointment Use 1/4" ribbon to affected eye & sutures if present 2x/day for 14 days. If you develop allergy symptoms, stop & call our office.    . fluticasone (FLONASE) 50 MCG/ACT nasal spray Place 1 spray into both nostrils 2 (two) times daily.     . fluticasone (FLOVENT HFA) 220 MCG/ACT inhaler INHALE 2 PUFFS BY MOUTH TWICE DAILY    . Fluticasone-Salmeterol (ADVAIR) 250-50 MCG/DOSE AEPB Inhale 1 puff into the lungs 2 (two) times daily.    . hydrochlorothiazide (HYDRODIURIL) 25 MG tablet Take 25 mg by mouth daily.    . hydrOXYzine (ATARAX/VISTARIL) 50 MG tablet hydroxyzine HCl 50 mg tablet    . levocetirizine (XYZAL) 5 MG tablet Take by mouth.    . levothyroxine (SYNTHROID, LEVOTHROID) 50 MCG tablet Take 50 mcg daily, any generic manufacturer is okay but try to stay with same manufacturer as much as possible    . magnesium oxide (MAG-OX) 400 MG tablet Take 400 mg by mouth daily.    . metFORMIN (GLUCOPHAGE) 500 MG tablet Take by mouth.    . metoCLOPramide (REGLAN) 5 MG tablet TAKE 1 TABLET BY MOUTH EVERY 6 HOURS AS NEEDED FOR MIGRAINE HEADACHE  1  . montelukast (SINGULAIR) 10 MG tablet Take 10 mg by mouth at bedtime.     . Multiple  Vitamins-Minerals (ZINC PO) Take 1 tablet by mouth daily.    . nitrofurantoin, macrocrystal-monohydrate, (MACROBID) 100 MG capsule Take by  mouth.    . ofloxacin (FLOXIN) 0.3 % OTIC solution ofloxacin 0.3 % ear drops    . Omalizumab (XOLAIR [place]) Inject 1 Dose into the skin every 30 (thirty) days.    . potassium chloride (K-DUR) 10 MEQ tablet Take by mouth.    . ranitidine (ZANTAC) 150 MG tablet Take 150 mg by mouth as directed. Takes 1 in the morning 1 before each meal and 1 before bedtime    . vitamin B-12 (CYANOCOBALAMIN) 1000 MCG tablet Take 1,000 mcg by mouth daily.    . vitamin C (ASCORBIC ACID) 500 MG tablet Take 500 mg by mouth at bedtime.    Marland Kitchen Desvenlafaxine Succinate ER (PRISTIQ) 25 MG TB24 Take 25 mg by mouth daily. To be combined with 50 mg to make it 75 mg 30 tablet 0  . topiramate (TOPAMAX) 50 MG tablet Take 0.5-1 tablets (25-50 mg total) by mouth at bedtime. 25 mg for 1 week and increase to 50 mg 30 tablet 2   No current facility-administered medications for this visit.      Musculoskeletal: Strength & Muscle Tone: within normal limits Gait & Station: normal Patient leans: N/A  Psychiatric Specialty Exam: Review of Systems  Psychiatric/Behavioral: The patient is nervous/anxious.   All other systems reviewed and are negative.   Blood pressure 134/79, pulse 78, temperature 97.8 F (36.6 C), temperature source Oral, weight 178 lb 9.6 oz (81 kg), last menstrual period 07/17/2016.Body mass index is 32.67 kg/m.  General Appearance: Casual  Eye Contact:  Fair  Speech:  Clear and Coherent  Volume:  Normal  Mood:  Anxious  Affect:  Congruent  Thought Process:  Goal Directed and Descriptions of Associations: Intact  Orientation:  Full (Time, Place, and Person)  Thought Content: Logical   Suicidal Thoughts:  No  Homicidal Thoughts:  No  Memory:  Immediate;   Fair Recent;   Fair Remote;   Fair  Judgement:  Fair  Insight:  Fair  Psychomotor Activity:  Normal   Concentration:  Concentration: Fair and Attention Span: Fair  Recall:  [employer] of Knowledge: Fair  Language: Fair  Akathisia:  No  Handed:  Right  AIMS (if indicated): na  Assets:  Communication Skills Desire for Improvement Social Support  ADL's:  Intact  Cognition: WNL  Sleep:  Fair   Screenings:   Assessment and Plan: Kristine [name] is a 47 year old female who has a history of anxiety, seasonal affective disorder, multiple allergies like urticaria/anaphylaxis, recent diagnosis of basal cell carcinoma, history of elevated TSH, presented to the clinic today for a follow-up visit.  Patient continues to struggle with some mood lability and hence discussed medication readjustment as noted below.  Patient also referred for psychotherapy, given a list of therapist in the community.  Plan GAD Increase Pristiq to 75 mg p.o. daily Referred for psychotherapy. Add Topamax 25 mg p.o. nightly for 1 week and increase to 50 mg if she tolerates it well.  Patient reports she had ADHD testing done at [place] [facility], pending report.  Follow-up in clinic in 4 weeks.  More than 50 % of the time was spent for psychoeducation and supportive psychotherapy and care coordination.  This note was generated in part or whole with voice recognition software. Voice recognition is usually quite accurate but there are transcription errors that can and very often do occur. I apologize for any typographical errors that were not detected and corrected.          Ursula Alert, MD 06/23/2018,  4:25 PM

## 2018-06-25 DIAGNOSIS — M9905 Segmental and somatic dysfunction of pelvic region: Secondary | ICD-10-CM | POA: Diagnosis not present

## 2018-06-25 DIAGNOSIS — M9901 Segmental and somatic dysfunction of cervical region: Secondary | ICD-10-CM | POA: Diagnosis not present

## 2018-06-25 DIAGNOSIS — R51 Headache: Secondary | ICD-10-CM | POA: Diagnosis not present

## 2018-06-27 DIAGNOSIS — M722 Plantar fascial fibromatosis: Secondary | ICD-10-CM | POA: Diagnosis not present

## 2018-06-29 DIAGNOSIS — L501 Idiopathic urticaria: Secondary | ICD-10-CM | POA: Diagnosis not present

## 2018-07-07 ENCOUNTER — Ambulatory Visit: Payer: Self-pay | Admitting: Licensed Clinical Social Worker

## 2018-07-08 DIAGNOSIS — D485 Neoplasm of uncertain behavior of skin: Secondary | ICD-10-CM | POA: Diagnosis not present

## 2018-07-08 DIAGNOSIS — D225 Melanocytic nevi of trunk: Secondary | ICD-10-CM | POA: Diagnosis not present

## 2018-07-08 DIAGNOSIS — D2262 Melanocytic nevi of left upper limb, including shoulder: Secondary | ICD-10-CM | POA: Diagnosis not present

## 2018-07-08 DIAGNOSIS — Z85828 Personal history of other malignant neoplasm of skin: Secondary | ICD-10-CM | POA: Diagnosis not present

## 2018-07-08 DIAGNOSIS — L570 Actinic keratosis: Secondary | ICD-10-CM | POA: Diagnosis not present

## 2018-07-08 DIAGNOSIS — L298 Other pruritus: Secondary | ICD-10-CM | POA: Diagnosis not present

## 2018-07-08 DIAGNOSIS — D0461 Carcinoma in situ of skin of right upper limb, including shoulder: Secondary | ICD-10-CM | POA: Diagnosis not present

## 2018-07-11 DIAGNOSIS — M722 Plantar fascial fibromatosis: Secondary | ICD-10-CM | POA: Diagnosis not present

## 2018-07-14 DIAGNOSIS — M9901 Segmental and somatic dysfunction of cervical region: Secondary | ICD-10-CM | POA: Diagnosis not present

## 2018-07-14 DIAGNOSIS — R51 Headache: Secondary | ICD-10-CM | POA: Diagnosis not present

## 2018-07-14 DIAGNOSIS — M9905 Segmental and somatic dysfunction of pelvic region: Secondary | ICD-10-CM | POA: Diagnosis not present

## 2018-07-15 DIAGNOSIS — L501 Idiopathic urticaria: Secondary | ICD-10-CM | POA: Diagnosis not present

## 2018-07-16 ENCOUNTER — Telehealth: Payer: Self-pay

## 2018-07-16 DIAGNOSIS — F411 Generalized anxiety disorder: Secondary | ICD-10-CM

## 2018-07-16 MED ORDER — DESVENLAFAXINE SUCCINATE ER 50 MG PO TB24: 50.0000 mg | ORAL_TABLET | Freq: Every day | ORAL | 1 refills | Status: DC

## 2018-07-16 MED ORDER — DESVENLAFAXINE SUCCINATE ER 25 MG PO TB24: 25.0000 mg | ORAL_TABLET | Freq: Every day | ORAL | 1 refills | Status: DC

## 2018-07-16 NOTE — Telephone Encounter (Signed)
pristiq sent to pharmacy

## 2018-07-16 NOTE — Telephone Encounter (Signed)
pt called and had left a message stating that she did not ahve any of the pristiq left to get to her next appt that she needed a 50mg  and a 25mg  sent into her pharmacy she also stated that she only able to take 1/2 of the topamax

## 2018-07-18 DIAGNOSIS — R51 Headache: Secondary | ICD-10-CM | POA: Diagnosis not present

## 2018-07-18 DIAGNOSIS — M722 Plantar fascial fibromatosis: Secondary | ICD-10-CM | POA: Diagnosis not present

## 2018-07-18 DIAGNOSIS — M9901 Segmental and somatic dysfunction of cervical region: Secondary | ICD-10-CM | POA: Diagnosis not present

## 2018-07-18 DIAGNOSIS — M9905 Segmental and somatic dysfunction of pelvic region: Secondary | ICD-10-CM | POA: Diagnosis not present

## 2018-07-19 DIAGNOSIS — R110 Nausea: Secondary | ICD-10-CM | POA: Diagnosis not present

## 2018-07-19 DIAGNOSIS — G43011 Migraine without aura, intractable, with status migrainosus: Secondary | ICD-10-CM | POA: Diagnosis not present

## 2018-07-21 ENCOUNTER — Encounter: Payer: Self-pay | Admitting: Psychiatry

## 2018-07-21 ENCOUNTER — Other Ambulatory Visit: Payer: Self-pay

## 2018-07-21 ENCOUNTER — Ambulatory Visit (INDEPENDENT_AMBULATORY_CARE_PROVIDER_SITE_OTHER): Payer: 59 | Admitting: Psychiatry

## 2018-07-21 VITALS — BP 117/73 | HR 76 | Temp 98.8°F | Wt 178.0 lb

## 2018-07-21 DIAGNOSIS — F411 Generalized anxiety disorder: Secondary | ICD-10-CM

## 2018-07-21 DIAGNOSIS — M722 Plantar fascial fibromatosis: Secondary | ICD-10-CM | POA: Diagnosis not present

## 2018-07-21 NOTE — Progress Notes (Signed)
[name] Progress Note  07/21/2018 12:45 PM Kristine [name]  MRN:  [id number]  Chief Complaint: ' I am here for follow up.' Chief Complaint    Follow-up; Medication Problem     HPI: Kristine [name] is a 47 year old female, married, employed, lives in [place], has a history of multiple medical problems like anxiety disorder, headaches, multiple allergies, VSD, chronic urticaria/anaphylaxis, elevated TSH, GERD, basal cell carcinoma, presented to the clinic today for a follow-up visit.  Patient today reports she continues to be tired, anxious and sad on and off .  She reports she started taking the Topamax however she started having more headaches.  She does not know if it is due to side effect of being on Topamax or whether it is due to her seasonal allergies which are getting worse this time of the year.  Patient reports she has missed work at least 2 times in the past few weeks.  She reports she continues to struggle with anhedonia and lack of motivation.  She reports her other provider has made some changes with her medications.  She is also on less anti-allergy medications which could be complicating things for her.  She plans to return to her allergy specialist to readjust her medications.  Patient continues to be on Pristiq 75 mg.  Patient reports she does not know if the higher dose of Pristiq is making her more tired or not however she wants to give it more time.  Patient denies any suicidality.  Patient denies any perceptual disturbances.  Patient reports she has been unable to find a therapist however is willing to start psychotherapy with our new therapist here in clinic.   Visit Diagnosis:    ICD-10-CM   1. GAD (generalized anxiety disorder) F41.1     Past Psychiatric History: I have reviewed past psychiatric history from my progress note on 03/03/2018.  Past trials of gabapentin-prescribed for pain, Klonopin-disinhibited, Ambien-sleepwalking, Wellbutrin-paradoxical reaction, Zoloft-side  effects.  Past Medical History:  Past Medical History:  Diagnosis Date  . Abnormal weight gain 08/05/2013  . Absence of interventricular septum 08/05/2013  . Allergic rhinitis 04/17/2016  . Allergic urticaria due to ingested food 02/09/2016  . Anxiety   . Asthma   . Clinical depression 09/30/2013  . Complication of anesthesia    PT HAD TROUBLE BREATHING AFTER HYSTERECTOMY IN OCTOBER  . Depression   . Dizziness   . Excess weight 09/30/2013  . GERD (gastroesophageal reflux disease)   . Headache, migraine 09/30/2013  . Heart murmur   . Heavy drinker 04/11/2014   Overview:  Patient reports drinking 2-3 bottles of wine each weekend.    . History of methicillin resistant staphylococcus aureus (MRSA) 11/2015  . Infection of urinary tract 09/30/2013  . Irregular bleeding 09/30/2013  . Multiple allergies   . VSD (ventricular septal defect and aortic arch hypoplasia     Past Surgical History:  Procedure Laterality Date  . BOTOX INJECTION N/A 10/23/2016   Procedure: BOTOX INJECTION;  Surgeon: Royston Cowper, MD;  Location: [facility];  Service: Urology;  Laterality: N/A;  . BREAST BIOPSY Left 11/13/13   benign  . CESAREAN SECTION    . COLONOSCOPY    . CORONARY ANGIOPLASTY    . CYSTOSCOPY N/A 10/23/2016   Procedure: CYSTOSCOPY;  Surgeon: Royston Cowper, MD;  Location: [facility];  Service: Urology;  Laterality: N/A;  . CYSTOSCOPY WITH HYDRODISTENSION AND BIOPSY    . LAPAROSCOPIC ASSISTED VAGINAL HYSTERECTOMY N/A 08/20/2016   Procedure: LAPAROSCOPIC ASSISTED  VAGINAL HYSTERECTOMY;  Surgeon: Boykin Nearing, MD;  Location: [facility];  Service: Gynecology;  Laterality: N/A;  . LAPAROSCOPIC BILATERAL SALPINGECTOMY Bilateral 08/20/2016   Procedure: LAPAROSCOPIC BILATERAL SALPINGECTOMY;  Surgeon: Boykin Nearing, MD;  Location: [facility];  Service: Gynecology;  Laterality: Bilateral;  . NASAL SINUS SURGERY     x12    Family Psychiatric History: I have reviewed family psychiatric history  from my progress note on 03/03/2018  Family History:  Family History  Problem Relation Age of Onset  . Breast cancer Maternal Aunt   . Breast cancer Paternal Aunt   . Breast cancer Maternal Grandmother   . Breast cancer Paternal Grandmother   . Breast cancer Paternal Aunt   . Bipolar disorder Paternal Aunt   . Kidney cancer Maternal Uncle   . Prostate cancer Paternal Grandfather   . Kidney disease Unknown        father's side  . Aneurysm Mother   . Liver disease Mother   . Drug abuse Mother   . Alcohol abuse Mother   . Anxiety disorder Mother   . Depression Mother   . Cancer Father   . Anxiety disorder Father   . Depression Father     Social History: Have reviewed social history from my progress note on 03/03/2018 Social History   Socioeconomic History  . Marital status: Married    Spouse name: greg   . Number of children: 2  . Years of education: Not on file  . Highest education level: Associate degree: occupational, Hotel manager, or vocational program  Occupational History  . Not on file  Social Needs  . Financial resource strain: Not hard at all  . Food insecurity:    Worry: Never true    Inability: Never true  . Transportation needs:    Medical: No    Non-medical: No  Tobacco Use  . Smoking status: Former Smoker    Types: Cigarettes    Last attempt to quit: 11/12/2006    Years since quitting: 11.6  . Smokeless tobacco: Never Used  . Tobacco comment: quit 2008  Substance and Sexual Activity  . Alcohol use: No    Alcohol/week: 0.0 standard drinks  . Drug use: No  . Sexual activity: Yes    Birth control/protection: None  Lifestyle  . Physical activity:    Days per week: 0 days    Minutes per session: 0 min  . Stress: Very much  Relationships  . Social connections:    Talks on phone: Not on file    Gets together: Not on file    Attends religious service: More than 4 times per year    Active member of club or organization: No    Attends meetings of clubs  or organizations: Never    Relationship status: Married  Other Topics Concern  . Not on file  Social History Narrative  . Not on file    Allergies:  Allergies  Allergen Reactions  . Bee Pollen Anaphylaxis  . Bee Venom Anaphylaxis  . Eggs Or Egg-Derived Products Anaphylaxis  . Keflex [Cephalexin] Anaphylaxis  . Lac Bovis Anaphylaxis  . Lactase Anaphylaxis and Other (See Comments)  . Milk-Related Compounds Anaphylaxis  . Penicillins Anaphylaxis    Has patient had a PCN reaction causing immediate rash, facial/tongue/throat swelling, SOB or lightheadedness with hypotension: No Has patient had a PCN reaction causing severe rash involving mucus membranes or skin necrosis: No Has patient had a PCN reaction that required hospitalization No Has patient had  a PCN reaction occurring within the last 10 years: Yes If all of the above answers are "NO", then may proceed with Cephalosporin use.   . Shellfish Allergy Anaphylaxis and Other (See Comments)  . Soy Allergy Anaphylaxis  . Wheat Bran Anaphylaxis  . Codeine     Other reaction(s): RASH  . Ferrous Gluconate Diarrhea    And vomiting And vomiting  . Hydrocodone Hives  . Iron Diarrhea  . Iodine Rash  . Latex Itching and Rash    Metabolic Disorder Labs: No results found for: HGBA1C, MPG No results found for: PROLACTIN No results found for: CHOL, TRIG, HDL, CHOLHDL, VLDL, LDLCALC Lab Results  Component Value Date   TSH 5.752 (H) 09/19/2017   TSH 1.190 09/04/2016    Therapeutic Level Labs: No results found for: LITHIUM No results found for: VALPROATE No components found for:  CBMZ  Current Medications: Current Outpatient Medications  Medication Sig Dispense Refill  . albuterol (PROVENTIL HFA;VENTOLIN HFA) 108 (90 Base) MCG/ACT inhaler Inhale 2 puffs into the lungs every 6 (six) hours as needed for wheezing or shortness of breath. 1 Inhaler 2  . azelastine (OPTIVAR) 0.05 % ophthalmic solution Place 1 drop into both eyes  daily.    . Azelastine HCl 0.15 % SOLN Place 2 sprays into the nose 2 (two) times daily.     . Biotin 10000 MCG TABS Take 10,000 mg by mouth daily.    . cetirizine (ZYRTEC) 10 MG tablet Take 2 tablets by mouth 2 (two) times daily.     . Cholecalciferol (VITAMIN D-1000 MAX ST) 1000 units tablet Take 1 tablet by mouth 2 (two) times daily.     Marland Kitchen CRANBERRY PO Take 1 capsule by mouth at bedtime.     Marland Kitchen desvenlafaxine (PRISTIQ) 50 MG 24 hr tablet Take 1 tablet (50 mg total) by mouth daily. To be combined with 25 mg 30 tablet 1  . Desvenlafaxine Succinate ER (PRISTIQ) 25 MG TB24 Take 25 mg by mouth daily. To be combined with 50 mg to make it 75 mg 30 tablet 1  . doxycycline (MONODOX) 100 MG capsule Take 100 mg by mouth 2 (two) times daily.  0  . EPINEPHrine 0.3 mg/0.3 mL IJ SOAJ injection Inject 0.3 mLs (0.3 mg total) into the muscle once. 1 Device 1  . erythromycin ophthalmic ointment Use 1/4" ribbon to affected eye & sutures if present 2x/day for 14 days. If you develop allergy symptoms, stop & call our office.    . fluticasone (FLONASE) 50 MCG/ACT nasal spray Place 1 spray into both nostrils 2 (two) times daily.     . fluticasone (FLOVENT HFA) 220 MCG/ACT inhaler INHALE 2 PUFFS BY MOUTH TWICE DAILY    . Fluticasone-Salmeterol (ADVAIR) 250-50 MCG/DOSE AEPB Inhale 1 puff into the lungs 2 (two) times daily.    . hydrochlorothiazide (HYDRODIURIL) 25 MG tablet Take 25 mg by mouth daily.    . hydrOXYzine (ATARAX/VISTARIL) 50 MG tablet hydroxyzine HCl 50 mg tablet    . levocetirizine (XYZAL) 5 MG tablet Take by mouth.    . levothyroxine (SYNTHROID, LEVOTHROID) 50 MCG tablet Take 50 mcg daily, any generic manufacturer is okay but try to stay with same manufacturer as much as possible    . magnesium oxide (MAG-OX) 400 MG tablet Take 400 mg by mouth daily.    . metFORMIN (GLUCOPHAGE) 500 MG tablet Take by mouth.    . metoCLOPramide (REGLAN) 5 MG tablet TAKE 1 TABLET BY MOUTH EVERY 6 HOURS AS NEEDED  FOR MIGRAINE  HEADACHE  1  . montelukast (SINGULAIR) 10 MG tablet Take 10 mg by mouth at bedtime.     . Multiple Vitamins-Minerals (ZINC PO) Take 1 tablet by mouth daily.    . nitrofurantoin, macrocrystal-monohydrate, (MACROBID) 100 MG capsule Take by mouth.    Marland Kitchen ofloxacin (FLOXIN) 0.3 % OTIC solution ofloxacin 0.3 % ear drops    . Omalizumab (XOLAIR [place]) Inject 1 Dose into the skin every 30 (thirty) days.    . potassium chloride (K-DUR) 10 MEQ tablet Take by mouth.    . ranitidine (ZANTAC) 150 MG tablet Take 150 mg by mouth as directed. Takes 1 in the morning 1 before each meal and 1 before bedtime    . vitamin B-12 (CYANOCOBALAMIN) 1000 MCG tablet Take 1,000 mcg by mouth daily.    . vitamin C (ASCORBIC ACID) 500 MG tablet Take 500 mg by mouth at bedtime.     No current facility-administered medications for this visit.      Musculoskeletal: Strength & Muscle Tone: within normal limits Gait & Station: normal Patient leans: N/A  Psychiatric Specialty Exam: Review of Systems  Psychiatric/Behavioral: Positive for depression. The patient is nervous/anxious and has insomnia.   All other systems reviewed and are negative.   Blood pressure 117/73, pulse 76, temperature 98.8 F (37.1 C), temperature source Oral, weight 178 lb (80.7 kg), last menstrual period 07/17/2016.Body mass index is 32.56 kg/m.  General Appearance: Casual  Eye Contact:  Fair  Speech:  Clear and Coherent  Volume:  Normal  Mood:  Anxious and Dysphoric  Affect:  Depressed  Thought Process:  Goal Directed and Descriptions of Associations: Intact  Orientation:  Full (Time, Place, and Person)  Thought Content: Logical   Suicidal Thoughts:  No  Homicidal Thoughts:  No  Memory:  Immediate;   Fair Recent;   Fair Remote;   Fair  Judgement:  Fair  Insight:  Fair  Psychomotor Activity:  Normal  Concentration:  Concentration: Fair and Attention Span: Fair  Recall:  [employer] of Knowledge: Fair  Language: Fair  Akathisia:  No   Handed:  Right  AIMS (if indicated): na  Assets:  Communication Skills Desire for Improvement Social Support  ADL's:  Intact  Cognition: WNL  Sleep:  restless   Screenings:   Assessment and Plan: Kristine [name] is a 47 year old female who has a history of anxiety, seasonal affective disorder, multiple allergies like urticaria/anaphylaxis, recent diagnosis of basal cell carcinoma history of elevated TSH, presented to the clinic today for a follow-up visit.  Patient continues to struggle with tiredness, depressive symptoms as well as sleep issues.  Patient also has multiple medical problems as well as recent medication changes which could be contributing to the same.  Patient had ADHD testing done and it has been discussed with her that she does not have ADHD.  Discussed referral for psychotherapy as well as medication changes as noted below.  Plan GAD Continue Pristiq 75 mg p.o. Daily Discontinue Topamax for side effects. Will refer patient for psychotherapy with our therapist here in clinic.  I have reviewed ADHD testing results with patient-discussed with her that she does not have ADHD.  Patient to follow-up in clinic in 2 weeks or sooner if needed.  More than 50 % of the time was spent for psychoeducation and supportive psychotherapy and care coordination. This note was generated in part or whole with voice recognition software. Voice recognition is usually quite accurate but there are transcription errors that can  and very often do occur. I apologize for any typographical errors that were not detected and corrected.        Ursula Alert, MD 07/21/2018, 12:45 PM

## 2018-07-23 DIAGNOSIS — M9905 Segmental and somatic dysfunction of pelvic region: Secondary | ICD-10-CM | POA: Diagnosis not present

## 2018-07-23 DIAGNOSIS — R51 Headache: Secondary | ICD-10-CM | POA: Diagnosis not present

## 2018-07-23 DIAGNOSIS — M9901 Segmental and somatic dysfunction of cervical region: Secondary | ICD-10-CM | POA: Diagnosis not present

## 2018-07-28 ENCOUNTER — Encounter: Payer: Self-pay | Admitting: Licensed Clinical Social Worker

## 2018-07-28 ENCOUNTER — Ambulatory Visit (INDEPENDENT_AMBULATORY_CARE_PROVIDER_SITE_OTHER): Payer: 59 | Admitting: Licensed Clinical Social Worker

## 2018-07-28 DIAGNOSIS — R200 Anesthesia of skin: Secondary | ICD-10-CM | POA: Diagnosis not present

## 2018-07-28 DIAGNOSIS — F411 Generalized anxiety disorder: Secondary | ICD-10-CM

## 2018-07-28 NOTE — Progress Notes (Signed)
   THERAPIST PROGRESS NOTE  Session Time: 1100-1200  Participation Level: Active  Behavioral Response: CasualDrowsyAnxious  Type of Therapy: Individual Therapy  Treatment Goals addressed: Anxiety  Interventions: CBT  Summary: Kristine [name] is a 47 y.o. female who presents with anxiety as it relates to her job, her relationship with her daughter, and her health. 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 She makes up horrible lies about me." [name]'s husband, Kristine [name], was also present for the session. We spoke about her goals for therapy, and what she would like to accomplish. [name] was not able to articulate any specific goals, but was in agreement with regulating her emotions and improving her relationship with her daughter. I provided psychoeducation on CBT and provided pt with a thought record to complete prior to her next session.    Suicidal/Homicidal: NAwithout intent/plan  Therapist Response: I provided Kristine [name] with a thought record and education on CBT. We will continue to work within the framework of CBT to increase Kristine [name]'s ability to manage her anxiety and depression symptoms.   Plan: Return again in  1 weeks.  Diagnosis: Axis I: Generalized Anxiety Disorder    Axis II: No diagnosis    [name], LCSW 07/28/2018

## 2018-07-30 ENCOUNTER — Telehealth: Payer: Self-pay

## 2018-07-30 ENCOUNTER — Telehealth: Payer: Self-pay | Admitting: Psychiatry

## 2018-07-30 DIAGNOSIS — M9905 Segmental and somatic dysfunction of pelvic region: Secondary | ICD-10-CM | POA: Diagnosis not present

## 2018-07-30 DIAGNOSIS — M9901 Segmental and somatic dysfunction of cervical region: Secondary | ICD-10-CM | POA: Diagnosis not present

## 2018-07-30 DIAGNOSIS — R51 Headache: Secondary | ICD-10-CM | POA: Diagnosis not present

## 2018-07-30 DIAGNOSIS — F411 Generalized anxiety disorder: Secondary | ICD-10-CM

## 2018-07-30 MED ORDER — DESVENLAFAXINE SUCCINATE ER 25 MG PO TB24: 75.0000 mg | ORAL_TABLET | Freq: Every day | ORAL | 1 refills | Status: DC

## 2018-07-30 NOTE — Telephone Encounter (Signed)
done

## 2018-07-30 NOTE — Telephone Encounter (Signed)
Sent pristiq to pharmacy for 3 tablets of 25 mg per day.

## 2018-07-30 NOTE — Telephone Encounter (Signed)
received fax from pharmacy stating desvenlafax succ er : "pt would like to get rx for taking 3 tablets daily of the 25mg  and not take the 50mg  so she only has one strength."

## 2018-08-01 DIAGNOSIS — M9901 Segmental and somatic dysfunction of cervical region: Secondary | ICD-10-CM | POA: Diagnosis not present

## 2018-08-01 DIAGNOSIS — R51 Headache: Secondary | ICD-10-CM | POA: Diagnosis not present

## 2018-08-01 DIAGNOSIS — M9905 Segmental and somatic dysfunction of pelvic region: Secondary | ICD-10-CM | POA: Diagnosis not present

## 2018-08-01 DIAGNOSIS — M722 Plantar fascial fibromatosis: Secondary | ICD-10-CM | POA: Diagnosis not present

## 2018-08-04 ENCOUNTER — Ambulatory Visit: Payer: 59 | Admitting: Psychiatry

## 2018-08-04 DIAGNOSIS — L501 Idiopathic urticaria: Secondary | ICD-10-CM | POA: Diagnosis not present

## 2018-08-11 ENCOUNTER — Ambulatory Visit (INDEPENDENT_AMBULATORY_CARE_PROVIDER_SITE_OTHER): Payer: 59 | Admitting: Licensed Clinical Social Worker

## 2018-08-11 ENCOUNTER — Encounter: Payer: Self-pay | Admitting: Licensed Clinical Social Worker

## 2018-08-11 DIAGNOSIS — F411 Generalized anxiety disorder: Secondary | ICD-10-CM | POA: Diagnosis not present

## 2018-08-11 DIAGNOSIS — R51 Headache: Secondary | ICD-10-CM | POA: Diagnosis not present

## 2018-08-11 DIAGNOSIS — M9901 Segmental and somatic dysfunction of cervical region: Secondary | ICD-10-CM | POA: Diagnosis not present

## 2018-08-11 DIAGNOSIS — M9905 Segmental and somatic dysfunction of pelvic region: Secondary | ICD-10-CM | POA: Diagnosis not present

## 2018-08-11 NOTE — Progress Notes (Signed)
   THERAPIST PROGRESS NOTE  Session Time: 130-230  Participation Level: Active  Behavioral Response: Well GroomedAlertAnxious  Type of Therapy: Individual Therapy  Treatment Goals addressed: Anxiety  Interventions: CBT  Summary: Kristine [name] is a 47 y.o. female who presents with anxiety symptoms. [name] reports continued stress around her relationship with her daughter. She stated, "nothing is ever right with her--and I do everything for her. She's just a bitch." We discussed how their personalities may be similar, and how they could affect their communication. We discussed [name]'s continued frustration with her work at [employer], and how she feels she cannot get any help there--even though she has advocated for herself several times. [name] disclosed that her son is in jail, and "he's on drugs and is a piece of shit. When he gets out he'll be exactly the same." She also stated her mother and two ex-husbands had problems with addiction--and we discussed the theme of taking care of others and attachment.  Suicidal/Homicidal: No  Therapist Response: Since [name] reported anger is her stressor right now, I asked [name] to make a list of things that make her angry over the next week in order to identify her triggers.    Plan: Return again in 1 weeks.  Diagnosis: Axis I: Generalized Anxiety Disorder    Axis II: No diagnosis    [name], LCSW 08/11/2018

## 2018-08-12 ENCOUNTER — Other Ambulatory Visit: Payer: Self-pay | Admitting: Obstetrics and Gynecology

## 2018-08-12 DIAGNOSIS — Z01419 Encounter for gynecological examination (general) (routine) without abnormal findings: Secondary | ICD-10-CM | POA: Diagnosis not present

## 2018-08-12 DIAGNOSIS — Z1231 Encounter for screening mammogram for malignant neoplasm of breast: Secondary | ICD-10-CM

## 2018-08-14 DIAGNOSIS — G5603 Carpal tunnel syndrome, bilateral upper limbs: Secondary | ICD-10-CM | POA: Diagnosis not present

## 2018-08-14 DIAGNOSIS — G5753 Tarsal tunnel syndrome, bilateral lower limbs: Secondary | ICD-10-CM | POA: Diagnosis not present

## 2018-08-14 DIAGNOSIS — G629 Polyneuropathy, unspecified: Secondary | ICD-10-CM | POA: Diagnosis not present

## 2018-08-14 DIAGNOSIS — M722 Plantar fascial fibromatosis: Secondary | ICD-10-CM | POA: Diagnosis not present

## 2018-08-14 DIAGNOSIS — G43119 Migraine with aura, intractable, without status migrainosus: Secondary | ICD-10-CM | POA: Diagnosis not present

## 2018-08-18 ENCOUNTER — Telehealth: Payer: Self-pay | Admitting: Psychiatry

## 2018-08-18 ENCOUNTER — Encounter: Payer: Self-pay | Admitting: Licensed Clinical Social Worker

## 2018-08-18 ENCOUNTER — Ambulatory Visit (INDEPENDENT_AMBULATORY_CARE_PROVIDER_SITE_OTHER): Payer: 59 | Admitting: Licensed Clinical Social Worker

## 2018-08-18 DIAGNOSIS — C539 Malignant neoplasm of cervix uteri, unspecified: Secondary | ICD-10-CM | POA: Insufficient documentation

## 2018-08-18 DIAGNOSIS — F411 Generalized anxiety disorder: Secondary | ICD-10-CM | POA: Diagnosis not present

## 2018-08-18 DIAGNOSIS — E669 Obesity, unspecified: Secondary | ICD-10-CM | POA: Diagnosis not present

## 2018-08-18 DIAGNOSIS — Z6832 Body mass index (BMI) 32.0-32.9, adult: Secondary | ICD-10-CM | POA: Diagnosis not present

## 2018-08-18 DIAGNOSIS — L501 Idiopathic urticaria: Secondary | ICD-10-CM | POA: Diagnosis not present

## 2018-08-18 DIAGNOSIS — Z79899 Other long term (current) drug therapy: Secondary | ICD-10-CM | POA: Diagnosis not present

## 2018-08-18 MED ORDER — DESVENLAFAXINE SUCCINATE ER 100 MG PO TB24: 100.0000 mg | ORAL_TABLET | Freq: Every day | ORAL | 1 refills | Status: DC

## 2018-08-18 NOTE — Progress Notes (Signed)
   THERAPIST PROGRESS NOTE  Session Time: 130-230  Participation Level: Active  Behavioral Response: Well GroomedAlertAnxious  Type of Therapy: Individual Therapy  Treatment Goals addressed: Anxiety  Interventions: CBT  Summary: Kristine [name] is a 47 y.o. female who presents with symptoms of anxiety and depression. [name] reports having a good week--but reports she is continuing to experience pain and doesn't feel she is able to have surgery to correct this due to needing to work. [name] also reported feeling depression symptoms around her weight--and stated she feels as thought she is unable to lose weight. She reports feeling very frustrated with that situation as well. [name] discussed her daughter, and how she continues to argue with her. She reported her husband attempts to parent her daughter, but she feels he is not doing it in an effective way. We discussed the idea of control, and how [name]'s anxiety is deeply rooted in lack of control that stems from her childhood. We were able to draw connections to her parenting style versus her husbands, based on their upbringings and how they were raised. [name] reported she did make a list of things that make her angry as asked, but stated she forgot to bring it with her.   Suicidal/Homicidal: No  Therapist Response: [name] continues to work towards her tx goals but has not yet reached them. She is able to understand the connection between her current anxiety symptoms and her childhood. She is also able to identify areas of her life she needs to improve, communication being high on the list.   Plan: Return again in 2 weeks.  Diagnosis: Axis I: Generalized Anxiety Disorder    Axis II: No diagnosis    [name], LCSW 08/18/2018

## 2018-08-18 NOTE — Telephone Encounter (Signed)
Sent Pristiq to pharmacy for 100 mg.

## 2018-08-20 DIAGNOSIS — M9905 Segmental and somatic dysfunction of pelvic region: Secondary | ICD-10-CM | POA: Diagnosis not present

## 2018-08-20 DIAGNOSIS — R51 Headache: Secondary | ICD-10-CM | POA: Diagnosis not present

## 2018-08-20 DIAGNOSIS — M9901 Segmental and somatic dysfunction of cervical region: Secondary | ICD-10-CM | POA: Diagnosis not present

## 2018-08-25 ENCOUNTER — Encounter: Payer: Self-pay | Admitting: Licensed Clinical Social Worker

## 2018-08-25 ENCOUNTER — Other Ambulatory Visit: Payer: Self-pay

## 2018-08-25 ENCOUNTER — Encounter: Payer: Self-pay | Admitting: Psychiatry

## 2018-08-25 ENCOUNTER — Ambulatory Visit (INDEPENDENT_AMBULATORY_CARE_PROVIDER_SITE_OTHER): Payer: 59 | Admitting: Licensed Clinical Social Worker

## 2018-08-25 ENCOUNTER — Ambulatory Visit
Admission: RE | Admit: 2018-08-25 | Discharge: 2018-08-25 | Disposition: A | Discharge status: Home / Self Care | Payer: 59 | Source: Ambulatory Visit | Attending: Obstetrics and Gynecology | Admitting: Obstetrics and Gynecology

## 2018-08-25 ENCOUNTER — Ambulatory Visit (INDEPENDENT_AMBULATORY_CARE_PROVIDER_SITE_OTHER): Payer: 59 | Admitting: Psychiatry

## 2018-08-25 VITALS — BP 132/82 | HR 84 | Temp 98.4°F | Wt 179.9 lb

## 2018-08-25 DIAGNOSIS — L570 Actinic keratosis: Secondary | ICD-10-CM | POA: Diagnosis not present

## 2018-08-25 DIAGNOSIS — Z1231 Encounter for screening mammogram for malignant neoplasm of breast: Secondary | ICD-10-CM | POA: Insufficient documentation

## 2018-08-25 DIAGNOSIS — F40298 Other specified phobia: Secondary | ICD-10-CM | POA: Diagnosis not present

## 2018-08-25 DIAGNOSIS — F411 Generalized anxiety disorder: Secondary | ICD-10-CM

## 2018-08-25 DIAGNOSIS — C44622 Squamous cell carcinoma of skin of right upper limb, including shoulder: Secondary | ICD-10-CM | POA: Diagnosis not present

## 2018-08-25 MED ORDER — ALPRAZOLAM 0.5 MG PO TABS: 0.5000 mg | ORAL_TABLET | ORAL | 0 refills | Status: DC

## 2018-08-25 NOTE — Patient Instructions (Signed)
Alprazolam tablets What is this medicine? ALPRAZOLAM (al PRAY zoe lam) is a benzodiazepine. It is used to treat anxiety and panic attacks. This medicine may be used for other purposes; ask your health care provider or pharmacist if you have questions. COMMON BRAND NAME(S): Xanax What should I tell my health care provider before I take this medicine? They need to know if you have any of these conditions: -an alcohol or drug abuse problem -bipolar disorder, depression, psychosis or other mental health conditions -glaucoma -kidney or liver disease -lung or breathing disease -myasthenia gravis -Parkinson's disease -porphyria -seizures or a history of seizures -suicidal thoughts -an unusual or allergic reaction to alprazolam, other benzodiazepines, foods, dyes, or preservatives -pregnant or trying to get pregnant -breast-feeding How should I use this medicine? Take this medicine by mouth with a glass of water. Follow the directions on the prescription label. Take your medicine at regular intervals. Do not take it more often than directed. Do not stop taking except on your doctor's advice. A special MedGuide will be given to you by the pharmacist with each prescription and refill. Be sure to read this information carefully each time. Talk to your pediatrician regarding the use of this medicine in children. Special care may be needed. Overdosage: If you think you have taken too much of this medicine contact a poison control center or emergency room at once. NOTE: This medicine is only for you. Do not share this medicine with others. What if I miss a dose? If you miss a dose, take it as soon as you can. If it is almost time for your next dose, take only that dose. Do not take double or extra doses. What may interact with this medicine? Do not take this medicine with any of the following medications: -certain antiviral medicines for HIV or AIDS like delavirdine, indinavir -certain medicines for  fungal infections like ketoconazole and itraconazole -narcotic medicines for cough -sodium oxybate This medicine may also interact with the following medications: -alcohol -antihistamines for allergy, cough and cold -certain antibiotics like clarithromycin, erythromycin, isoniazid, rifampin, rifapentine, rifabutin, and troleandomycin -certain medicines for blood pressure, heart disease, irregular heart beat -certain medicines for depression, like amitriptyline, fluoxetine, sertraline -certain medicines for seizures like carbamazepine, oxcarbazepine, phenobarbital, phenytoin, primidone -cimetidine -cyclosporine -female hormones, like estrogens or progestins and birth control pills, patches, rings, or injections -general anesthetics like halothane, isoflurane, methoxyflurane, propofol -grapefruit juice -local anesthetics like lidocaine, pramoxine, tetracaine -medicines that relax muscles for surgery -narcotic medicines for pain -other antiviral medicines for HIV or AIDS -phenothiazines like chlorpromazine, mesoridazine, prochlorperazine, thioridazine This list may not describe all possible interactions. Give your health care provider a list of all the medicines, herbs, non-prescription drugs, or dietary supplements you use. Also tell them if you smoke, drink alcohol, or use illegal drugs. Some items may interact with your medicine. What should I watch for while using this medicine? Tell your doctor or health care professional if your symptoms do not start to get better or if they get worse. Do not stop taking except on your doctor's advice. You may develop a severe reaction. Your doctor will tell you how much medicine to take. You may get drowsy or dizzy. Do not drive, use machinery, or do anything that needs mental alertness until you know how this medicine affects you. To reduce the risk of dizzy and fainting spells, do not stand or sit up quickly, especially if you are an older patient.  Alcohol may increase dizziness and drowsiness. Avoid alcoholic  drinks. If you are taking another medicine that also causes drowsiness, you may have more side effects. Give your health care provider a list of all medicines you use. Your doctor will tell you how much medicine to take. Do not take more medicine than directed. Call emergency for help if you have problems breathing or unusual sleepiness. What side effects may I notice from receiving this medicine? Side effects that you should report to your doctor or health care professional as soon as possible: -allergic reactions like skin rash, itching or hives, swelling of the face, lips, or tongue -breathing problems -confusion -loss of balance or coordination -signs and symptoms of low blood pressure like dizziness; feeling faint or lightheaded, falls; unusually weak or tired -suicidal thoughts or other mood changes Side effects that usually do not require medical attention (report to your doctor or health care professional if they continue or are bothersome): -dizziness -dry mouth -nausea, vomiting -tiredness This list may not describe all possible side effects. Call your doctor for medical advice about side effects. You may report side effects to FDA at 1-800-FDA-1088. Where should I keep my medicine? Keep out of the reach of children. This medicine can be abused. Keep your medicine in a safe place to protect it from theft. Do not share this medicine with anyone. Selling or giving away this medicine is dangerous and against the law. Store at room temperature between 20 and 25 degrees C (68 and 77 degrees F). This medicine may cause accidental overdose and death if taken by other adults, children, or pets. Mix any unused medicine with a substance like cat litter or coffee grounds. Then throw the medicine away in a sealed container like a sealed bag or a coffee can with a lid. Do not use the medicine after the expiration date. NOTE: This sheet is  a summary. It may not cover all possible information. If you have questions about this medicine, talk to your doctor, pharmacist, or health care provider.  2018 Elsevier/Gold Standard (2015-07-28 13:47:25)

## 2018-08-25 NOTE — Progress Notes (Signed)
[facility] MD/ OP Progress Note  08/25/2018 12:42 PM Kristine [name]  MRN:  [id number]  Chief Complaint: ' I am here for follow up.' Chief Complaint    Follow-up; Medication Refill     HPI: Kristine [name] is a 47 year old female, married, employed, lives in [place], has a history of multiple medical problems like anxiety disorder, headaches, multiple allergies, VSD, chronic urticaria/anaphylaxis, elevated TSH, GERD, basal cell carcinoma, presented to the clinic today for a follow-up visit.  Patient today reports she continues to be anxious since she is going to have a dermatology procedure done.  She reports she is phobic about needles and always have a panic attack if she has to undergo procedures.  She reports she currently has some racing heart rate and shortness of breath.  She reports she has been trying to relax and trying to make use of her coping techniques.  Patient reports she has started using her Pristiq 100 mg daily.  She reports it may be helping to some extent.  She denies any side effects to the medication. Denies any suicidality.  Patient denies any perceptual disturbances.  Spent some time with patient and provided supportive psychotherapy.  Discussed to continue intensive psychotherapy sessions with her therapist here in clinic.  Patient also complains of headaches and sleep problems.  She reports she wakes up in the a.m. with the headache on a regular basis.  She describes her sleep as restless.She has fatigue as well as difficulty with concentration.   She was advised to fill out Epworth sleep scale and she scored 23-very high on the same.  Hence we will refer her for sleep study.   Visit Diagnosis: R/O Sleep apnea   ICD-10-CM   1. GAD (generalized anxiety disorder) F41.1 ALPRAZolam (XANAX) 0.5 MG tablet  2. Needle phobia F40.298     Past Psychiatric History: Have reviewed past psychiatric history from my progress note on 03/03/2018.  Past trials of gabapentin-prescribed for  pain, Klonopin-disinhibited, Ambien-sleepwalking, Wellbutrin-paradoxical reaction, Zoloft-side effects.  Past Medical History:  Past Medical History:  Diagnosis Date  . Abnormal weight gain 08/05/2013  . Absence of interventricular septum 08/05/2013  . Allergic rhinitis 04/17/2016  . Allergic urticaria due to ingested food 02/09/2016  . Anxiety   . Asthma   . Clinical depression 09/30/2013  . Complication of anesthesia    PT HAD TROUBLE BREATHING AFTER HYSTERECTOMY IN OCTOBER  . Depression   . Dizziness   . Excess weight 09/30/2013  . GERD (gastroesophageal reflux disease)   . Headache, migraine 09/30/2013  . Heart murmur   . Heavy drinker 04/11/2014   Overview:  Patient reports drinking 2-3 bottles of wine each weekend.    . History of methicillin resistant staphylococcus aureus (MRSA) 11/2015  . Infection of urinary tract 09/30/2013  . Irregular bleeding 09/30/2013  . Multiple allergies   . VSD (ventricular septal defect and aortic arch hypoplasia     Past Surgical History:  Procedure Laterality Date  . BOTOX INJECTION N/A 10/23/2016   Procedure: BOTOX INJECTION;  Surgeon: Royston Cowper, MD;  Location: [facility] OR[facility];  Service: Urology;  Laterality: N/A;  . BREAST BIOPSY Left 11/13/13   benign  . CESAREAN SECTION    . COLONOSCOPY    . CORONARY ANGIOPLASTY    . CYSTOSCOPY N/A 10/23/2016   Procedure: CYSTOSCOPY;  Surgeon: Royston Cowper, MD;  Location: [facility];  Service: Urology;  Laterality: N/A;  . CYSTOSCOPY WITH HYDRODISTENSION AND BIOPSY    . LAPAROSCOPIC ASSISTED VAGINAL HYSTERECTOMY  N/A 08/20/2016   Procedure: LAPAROSCOPIC ASSISTED VAGINAL HYSTERECTOMY;  Surgeon: Boykin Nearing, MD;  Location: [facility] OR[facility];  Service: Gynecology;  Laterality: N/A;  . LAPAROSCOPIC BILATERAL SALPINGECTOMY Bilateral 08/20/2016   Procedure: LAPAROSCOPIC BILATERAL SALPINGECTOMY;  Surgeon: Boykin Nearing, MD;  Location: [facility];  Service: Gynecology;  Laterality: Bilateral;  . NASAL SINUS  SURGERY     x12    Family Psychiatric History: Reviewed family psychiatric history from my progress note on 03/03/2018.  Family History:  Family History  Problem Relation Age of Onset  . Breast cancer Maternal Aunt   . Breast cancer Paternal Aunt   . Breast cancer Maternal Grandmother   . Breast cancer Paternal Grandmother   . Breast cancer Paternal Aunt   . Bipolar disorder Paternal Aunt   . Kidney cancer Maternal Uncle   . Prostate cancer Paternal Grandfather   . Kidney disease Unknown        father's side  . Aneurysm Mother   . Liver disease Mother   . Drug abuse Mother   . Alcohol abuse Mother   . Anxiety disorder Mother   . Depression Mother   . Cancer Father   . Anxiety disorder Father   . Depression Father     Social History: Reviewed social history from my progress note on 03/03/2018. Social History   Socioeconomic History  . Marital status: Married    Spouse name: greg   . Number of children: 2  . Years of education: Not on file  . Highest education level: Associate degree: occupational, Hotel manager, or vocational program  Occupational History  . Not on file  Social Needs  . Financial resource strain: Not hard at all  . Food insecurity:    Worry: Never true    Inability: Never true  . Transportation needs:    Medical: No    Non-medical: No  Tobacco Use  . Smoking status: Former Smoker    Types: Cigarettes    Last attempt to quit: 11/12/2006    Years since quitting: 11.7  . Smokeless tobacco: Never Used  . Tobacco comment: quit 2008  Substance and Sexual Activity  . Alcohol use: No    Alcohol/week: 0.0 standard drinks  . Drug use: No  . Sexual activity: Yes    Birth control/protection: None  Lifestyle  . Physical activity:    Days per week: 0 days    Minutes per session: 0 min  . Stress: Very much  Relationships  . Social connections:    Talks on phone: Not on file    Gets together: Not on file    Attends religious service: More than 4 times  per year    Active member of club or organization: No    Attends meetings of clubs or organizations: Never    Relationship status: Married  Other Topics Concern  . Not on file  Social History Narrative  . Not on file    Allergies:  Allergies  Allergen Reactions  . Bee Pollen Anaphylaxis  . Bee Venom Anaphylaxis  . Eggs Or Egg-Derived Products Anaphylaxis  . Keflex [Cephalexin] Anaphylaxis  . Lac Bovis Anaphylaxis  . Lactase Anaphylaxis and Other (See Comments)  . Milk-Related Compounds Anaphylaxis  . Penicillins Anaphylaxis    Has patient had a PCN reaction causing immediate rash, facial/tongue/throat swelling, SOB or lightheadedness with hypotension: No Has patient had a PCN reaction causing severe rash involving mucus membranes or skin necrosis: No Has patient had a PCN reaction that required hospitalization  No Has patient had a PCN reaction occurring within the last 10 years: Yes If all of the above answers are "NO", then may proceed with Cephalosporin use.   . Shellfish Allergy Anaphylaxis and Other (See Comments)  . Soy Allergy Anaphylaxis  . Wheat Bran Anaphylaxis  . Codeine     Other reaction(s): RASH  . Ferrous Gluconate Diarrhea    And vomiting And vomiting  . Hydrocodone Hives  . Iron Diarrhea  . Iodine Rash  . Latex Itching and Rash    Metabolic Disorder Labs: No results found for: HGBA1C, MPG No results found for: PROLACTIN No results found for: CHOL, TRIG, HDL, CHOLHDL, VLDL, LDLCALC Lab Results  Component Value Date   TSH 5.752 (H) 09/19/2017   TSH 1.190 09/04/2016    Therapeutic Level Labs: No results found for: LITHIUM No results found for: VALPROATE No components found for:  CBMZ  Current Medications: Current Outpatient Medications  Medication Sig Dispense Refill  . albuterol (PROVENTIL HFA;VENTOLIN HFA) 108 (90 Base) MCG/ACT inhaler Inhale 2 puffs into the lungs every 6 (six) hours as needed for wheezing or shortness of breath. 1 Inhaler 2   . azelastine (OPTIVAR) 0.05 % ophthalmic solution Place 1 drop into both eyes daily.    . Azelastine HCl 0.15 % SOLN Place 2 sprays into the nose 2 (two) times daily.     . Biotin 10000 MCG TABS Take 10,000 mg by mouth daily.    . cetirizine (ZYRTEC) 10 MG tablet Take 2 tablets by mouth 2 (two) times daily.     . Cholecalciferol (VITAMIN D-1000 MAX ST) 1000 units tablet Take 1 tablet by mouth 2 (two) times daily.     Marland Kitchen CRANBERRY PO Take 1 capsule by mouth at bedtime.     Marland Kitchen desvenlafaxine (PRISTIQ) 100 MG 24 hr tablet Take 1 tablet (100 mg total) by mouth daily. 90 tablet 1  . doxycycline (MONODOX) 100 MG capsule Take 100 mg by mouth 2 (two) times daily.  0  . EPINEPHrine 0.3 mg/0.3 mL IJ SOAJ injection Inject 0.3 mLs (0.3 mg total) into the muscle once. 1 Device 1  . erythromycin ophthalmic ointment Use 1/4" ribbon to affected eye & sutures if present 2x/day for 14 days. If you develop allergy symptoms, stop & call our office.    . fluticasone (FLONASE) 50 MCG/ACT nasal spray Place 1 spray into both nostrils 2 (two) times daily.     . fluticasone (FLOVENT HFA) 220 MCG/ACT inhaler INHALE 2 PUFFS BY MOUTH TWICE DAILY    . Fluticasone-Salmeterol (ADVAIR) 250-50 MCG/DOSE AEPB Inhale 1 puff into the lungs 2 (two) times daily.    . Galcanezumab-gnlm 120 MG/ML SOAJ Inject into the skin.    . hydrochlorothiazide (HYDRODIURIL) 25 MG tablet Take 25 mg by mouth daily.    . hydrochlorothiazide (HYDRODIURIL) 25 MG tablet TAKE 1 TABLET BY MOUTH ONCE DAILY    . hydrOXYzine (ATARAX/VISTARIL) 50 MG tablet hydroxyzine HCl 50 mg tablet    . levocetirizine (XYZAL) 5 MG tablet Take by mouth.    . levothyroxine (SYNTHROID, LEVOTHROID) 50 MCG tablet Take 50 mcg daily, any generic manufacturer is okay but try to stay with same manufacturer as much as possible    . magnesium oxide (MAG-OX) 400 MG tablet Take 400 mg by mouth daily.    . metFORMIN (GLUCOPHAGE) 500 MG tablet Take by mouth.    . metoCLOPramide (REGLAN) 5  MG tablet TAKE 1 TABLET BY MOUTH EVERY 6 HOURS AS NEEDED FOR MIGRAINE  HEADACHE  1  . montelukast (SINGULAIR) 10 MG tablet Take 10 mg by mouth at bedtime.     . Multiple Vitamins-Minerals (ZINC PO) Take 1 tablet by mouth daily.    . nitrofurantoin, macrocrystal-monohydrate, (MACROBID) 100 MG capsule Take by mouth.    Marland Kitchen ofloxacin (FLOXIN) 0.3 % OTIC solution ofloxacin 0.3 % ear drops    . omalizumab (XOLAIR) 150 MG injection INJECT 300MG  SUBCUTANEOUSLY EVERY 4 WEEKS (GIVEN AT PRESCRIBERS OFFICE)    . potassium chloride (K-DUR) 10 MEQ tablet Take by mouth.    . ranitidine (ZANTAC) 150 MG tablet Take 150 mg by mouth as directed. Takes 1 in the morning 1 before each meal and 1 before bedtime    . vitamin B-12 (CYANOCOBALAMIN) 1000 MCG tablet Take 1,000 mcg by mouth daily.    . vitamin C (ASCORBIC ACID) 500 MG tablet Take 500 mg by mouth at bedtime.    . ALPRAZolam (XANAX) 0.5 MG tablet Take 1 tablet (0.5 mg total) by mouth as directed. Take it only for severe panic symptoms 10 tablet 0   No current facility-administered medications for this visit.      Musculoskeletal: Strength & Muscle Tone: within normal limits Gait & Station: normal Patient leans: N/A  Psychiatric Specialty Exam: Review of Systems  Psychiatric/Behavioral: Positive for depression. The patient is nervous/anxious and has insomnia.     Blood pressure 132/82, pulse 84, temperature 98.4 F (36.9 C), temperature source Oral, weight 179 lb 14.2 oz (81.6 kg), last menstrual period 07/17/2016.Body mass index is 32.9 kg/m.  General Appearance: Casual  Eye Contact:  Fair  Speech:  Normal Rate  Volume:  Normal  Mood:  Anxious  Affect:  Congruent  Thought Process:  Goal Directed and Descriptions of Associations: Intact  Orientation:  Full (Time, Place, and Person)  Thought Content: Logical   Suicidal Thoughts:  No  Homicidal Thoughts:  No  Memory:  Immediate;   Fair Recent;   Fair Remote;   Fair  Judgement:  Fair  Insight:   Fair  Psychomotor Activity:  Normal  Concentration:  Concentration: Fair and Attention Span: Fair  Recall:  [employer] of Knowledge: Fair  Language: Fair  Akathisia:  No  Handed:  Right  AIMS (if indicated): na  Assets:  Communication Skills Desire for Improvement Social Support  ADL's:  Intact  Cognition: WNL  Sleep:  RESTLESS   Screenings:   Assessment and Plan: Syd is a 47 year old female who has a history of anxiety, seasonal affective disorder, multiple allergies like urticaria/anaphylaxis, recent diagnosis of basal cell carcinoma, history of elevated TSH, presented to the clinic today for a follow-up visit.  Patient today continues to struggle with anxiety symptoms and sleep problems.  She is especially anxious since she is going to undergo a procedure today.  Patient will continue medication changes as noted below.  She will also continue psychotherapy sessions.   Plan GAD Continue Pristiq 100 mg p.o. daily Start Xanax 0.5 mg, only for severe anxiety symptoms as needed.  Discussed with patient about the risk of being on medications like Xanax.  Also discussed concerns about long-term use, addictive potential, side effects like cognitive changes, falls and so on.  Patient agrees to limit use.  For needle phopbia Patient to continue psychotherapy sessions with her therapist here in clinic  Rule out sleep apnea Epworth sleep scale-23 Will refer her for a sleep study.  Follow-up in clinic in 2-4 weeks or sooner if needed.  More than 50 %  of the time was spent for psychoeducation and supportive psychotherapy and care coordination.  This note was generated in part or whole with voice recognition software. Voice recognition is usually quite accurate but there are transcription errors that can and very often do occur. I apologize for any typographical errors that were not detected and corrected.      Ursula Alert, MD 08/25/2018, 12:42 PM

## 2018-08-25 NOTE — Progress Notes (Signed)
   THERAPIST PROGRESS NOTE  Session Time: 130  Participation Level: Active  Behavioral Response: Well GroomedAlertAnxious  Type of Therapy: Individual Therapy  Treatment Goals addressed: Coping  Interventions: CBT  Summary: Kristine [name] is a 47 y.o. female who presents with anxiety. [name] reports feeling anxious about her son getting out of prison on Thursday. She states she is worried he will want to come live with her, "and I just feel guilty because I know that's not the right thing--but I feel obligated." We discussed ways to set appropriate boundaries, and ways he could earn her trust after being released from prison. We also discussed ways she could be supportive without allowing him to live in her home. [name] also reported having a lot of anxiety around her daughter, and continued anger around the way she acts. [name] reported, "I just don't know what causes this deep seeded anger towards me." We discussed various ways her daughter demonstrates disrespectful behavior towards [name], and how Clint could handle it. [name] reported she plans to let her husband be in charge of discipline from now on, in an effort to decrease her anxiety level. This was a good example of setting appropriate boundaries.   Suicidal/Homicidal: No  Therapist Response: [name] is able to speak openly about her anxiety and anger, and how it affects her daily. She is able to make connections between her childhood and how she behaves on a regular basis. We will continue to utilize CBT to improve [name]'s ability to manage her anxiety and anger.   Plan: Return again in 1 weeks.  Diagnosis: Axis I: Generalized Anxiety Disorder    Axis II: No diagnosis    [name], LCSW 08/25/2018

## 2018-08-27 DIAGNOSIS — G629 Polyneuropathy, unspecified: Secondary | ICD-10-CM | POA: Insufficient documentation

## 2018-08-27 DIAGNOSIS — G5603 Carpal tunnel syndrome, bilateral upper limbs: Secondary | ICD-10-CM | POA: Insufficient documentation

## 2018-08-27 DIAGNOSIS — G43119 Migraine with aura, intractable, without status migrainosus: Secondary | ICD-10-CM | POA: Insufficient documentation

## 2018-08-29 DIAGNOSIS — G43909 Migraine, unspecified, not intractable, without status migrainosus: Secondary | ICD-10-CM | POA: Diagnosis not present

## 2018-09-01 ENCOUNTER — Ambulatory Visit (INDEPENDENT_AMBULATORY_CARE_PROVIDER_SITE_OTHER): Payer: 59 | Admitting: Licensed Clinical Social Worker

## 2018-09-01 DIAGNOSIS — F411 Generalized anxiety disorder: Secondary | ICD-10-CM
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
## 2018-09-03 DIAGNOSIS — M9905 Segmental and somatic dysfunction of pelvic region: Secondary | ICD-10-CM | POA: Diagnosis not present

## 2018-09-03 DIAGNOSIS — R51 Headache: Secondary | ICD-10-CM | POA: Diagnosis not present

## 2018-09-03 DIAGNOSIS — M9901 Segmental and somatic dysfunction of cervical region: Secondary | ICD-10-CM | POA: Diagnosis not present

## 2018-09-04 DIAGNOSIS — L501 Idiopathic urticaria: Secondary | ICD-10-CM | POA: Diagnosis not present

## 2018-09-05 DIAGNOSIS — R51 Headache: Secondary | ICD-10-CM | POA: Diagnosis not present

## 2018-09-05 DIAGNOSIS — M9901 Segmental and somatic dysfunction of cervical region: Secondary | ICD-10-CM | POA: Diagnosis not present

## 2018-09-05 DIAGNOSIS — M9905 Segmental and somatic dysfunction of pelvic region: Secondary | ICD-10-CM | POA: Diagnosis not present

## 2018-09-08 ENCOUNTER — Telehealth: Payer: Self-pay

## 2018-09-08 ENCOUNTER — Encounter: Payer: Self-pay | Admitting: Licensed Clinical Social Worker

## 2018-09-08 ENCOUNTER — Ambulatory Visit (INDEPENDENT_AMBULATORY_CARE_PROVIDER_SITE_OTHER): Payer: 59 | Admitting: Licensed Clinical Social Worker

## 2018-09-08 DIAGNOSIS — R51 Headache: Secondary | ICD-10-CM | POA: Diagnosis not present

## 2018-09-08 DIAGNOSIS — M9901 Segmental and somatic dysfunction of cervical region: Secondary | ICD-10-CM | POA: Diagnosis not present

## 2018-09-08 DIAGNOSIS — F411 Generalized anxiety disorder: Secondary | ICD-10-CM | POA: Diagnosis not present

## 2018-09-08 DIAGNOSIS — M9905 Segmental and somatic dysfunction of pelvic region: Secondary | ICD-10-CM | POA: Diagnosis not present

## 2018-09-08 NOTE — Progress Notes (Signed)
   THERAPIST PROGRESS NOTE  Session Time: 130  Participation Level: Active  Behavioral Response: CasualAlertDepressed  Type of Therapy: Individual Therapy  Treatment Goals addressed: Anxiety  Interventions: CBT  Summary: Kristine [name] is a 47 y.o. female who presents with anxiety symptoms. [name] reports, "it just never stops." She stated she had to buy her daughter a new car following her daughter's car accident. She reported her daughter's behavior has been better after getting the new car, "but now I'm having to work two extra days a week." [name] reported this was difficult due to her medical symptoms. She added feeling stressed related to her daughter's cheerleading team, as it costs significant amounts of money for travel and competitions. [name] continues to report having a migraine and not being able to access the medication she needs due to her doctor's office. We revisited the idea of controlling aspects of life she can versus ones she cannot. We also discussed assertive communication versus aggressive communication as it related to these events. [name] reported her short term memory has deteriorated and she believes it is because "I have too much on my plate." We discussed ways to improve memory including reducing stress.   Suicidal/Homicidal: No  Therapist Response: [name] presented with a depressed affect but was able to speak openly and honestly about her symptoms. We will continue to utilize DBT & CBT skills to manage anxiety and depression. Additionally, [name] reported being in agreement with moving to every other week sessions moving forward.   Plan: Return again in 2 weeks.  Diagnosis: Axis I: Generalized Anxiety Disorder    Axis II: No diagnosis    [name], LCSW 09/08/2018

## 2018-09-08 NOTE — Telephone Encounter (Signed)
Yes , during her last visit ,she did and epworth sleep scale , scored high. I gave that info to CMA , not sure who was here that day , to refer her for sleep study. Please refer her to one if not already done.

## 2018-09-08 NOTE — Telephone Encounter (Signed)
Pt called states she suppose to have a sleep study done. do you know who that was with?

## 2018-09-09 DIAGNOSIS — G43119 Migraine with aura, intractable, without status migrainosus: Secondary | ICD-10-CM | POA: Diagnosis not present

## 2018-09-09 DIAGNOSIS — R110 Nausea: Secondary | ICD-10-CM | POA: Diagnosis not present

## 2018-09-09 DIAGNOSIS — M5481 Occipital neuralgia: Secondary | ICD-10-CM | POA: Diagnosis not present

## 2018-09-15 ENCOUNTER — Ambulatory Visit: Payer: Self-pay | Admitting: Licensed Clinical Social Worker

## 2018-09-15 DIAGNOSIS — M9905 Segmental and somatic dysfunction of pelvic region: Secondary | ICD-10-CM | POA: Diagnosis not present

## 2018-09-15 DIAGNOSIS — M9901 Segmental and somatic dysfunction of cervical region: Secondary | ICD-10-CM | POA: Diagnosis not present

## 2018-09-15 DIAGNOSIS — R51 Headache: Secondary | ICD-10-CM | POA: Diagnosis not present

## 2018-09-15 DIAGNOSIS — G4730 Sleep apnea, unspecified: Secondary | ICD-10-CM | POA: Diagnosis not present

## 2018-09-16 DIAGNOSIS — G4730 Sleep apnea, unspecified: Secondary | ICD-10-CM | POA: Diagnosis not present

## 2018-09-18 DIAGNOSIS — R399 Unspecified symptoms and signs involving the genitourinary system: Secondary | ICD-10-CM | POA: Diagnosis not present

## 2018-09-18 DIAGNOSIS — N390 Urinary tract infection, site not specified: Secondary | ICD-10-CM | POA: Diagnosis not present

## 2018-09-18 DIAGNOSIS — R319 Hematuria, unspecified: Secondary | ICD-10-CM | POA: Diagnosis not present

## 2018-09-22 ENCOUNTER — Ambulatory Visit: Payer: 59 | Admitting: Psychiatry

## 2018-09-22 ENCOUNTER — Ambulatory Visit (INDEPENDENT_AMBULATORY_CARE_PROVIDER_SITE_OTHER): Payer: 59 | Admitting: Licensed Clinical Social Worker

## 2018-09-22 ENCOUNTER — Encounter: Payer: Self-pay | Admitting: Licensed Clinical Social Worker

## 2018-09-22 ENCOUNTER — Other Ambulatory Visit: Payer: Self-pay

## 2018-09-22 ENCOUNTER — Encounter: Payer: Self-pay | Admitting: Psychiatry

## 2018-09-22 VITALS — BP 128/82 | HR 76 | Temp 98.9°F | Wt 175.6 lb

## 2018-09-22 DIAGNOSIS — G4701 Insomnia due to medical condition: Secondary | ICD-10-CM

## 2018-09-22 DIAGNOSIS — F411 Generalized anxiety disorder: Secondary | ICD-10-CM | POA: Diagnosis not present

## 2018-09-22 DIAGNOSIS — F40298 Other specified phobia: Secondary | ICD-10-CM

## 2018-09-22 DIAGNOSIS — R51 Headache: Secondary | ICD-10-CM | POA: Diagnosis not present

## 2018-09-22 DIAGNOSIS — M9901 Segmental and somatic dysfunction of cervical region: Secondary | ICD-10-CM | POA: Diagnosis not present

## 2018-09-22 DIAGNOSIS — G4762 Sleep related leg cramps: Secondary | ICD-10-CM

## 2018-09-22 DIAGNOSIS — M9905 Segmental and somatic dysfunction of pelvic region: Secondary | ICD-10-CM | POA: Diagnosis not present

## 2018-09-22 MED ORDER — RAMELTEON 8 MG PO TABS: 8.0000 mg | ORAL_TABLET | Freq: Every day | ORAL | 1 refills | Status: DC

## 2018-09-22 NOTE — Progress Notes (Signed)
[facility] MD OP Progress Note  09/22/2018 2:51 PM Kristine [name]  MRN:  [id number]  Chief Complaint: ' I am here for follow up." Chief Complaint    Follow-up; Medication Refill     HPI: Kristine [name] is a 47 year old female, married, employed, lives in [place], has a history of multiple medical problems like anxiety disorder, headaches, multiple allergies, VSD, chronic urticaria/anaphylaxis, elevated TSH, GERD, basal cell carcinoma, presented to the clinic today for a follow-up visit.  Patient today reports she is currently doing okay on the Pristiq.  She denies any significant anxiety attacks or panic attacks and reports Pristiq as helping with her mood symptoms.  Patient denies any side effects to the Pristiq.  Patient reports she is compliant.  Patient does report she continues to struggle with sleep.  She reports she has cramps at night but she has tried and failed several medications in the past.  She reports she has tried medications like gabapentin, Klonopin,requip.  Discussed adding iron replacement to help with her possible restless leg at night.  Patient however reports she cannot take iron supplements.  Describes her symptoms as cramps in her lower estremities.  Discussed with patient to reach out to her neurologist regarding her symptoms.  Patient recently had sleep study done.  It was discussed with patient.  Based on sleep study she does not have obstructive sleep apnea.  She was advised to lose weight.  Patient reports she is currently working on the same.  Discussed with patient about adding a sleep aid.  Patient reports she has tried several sleep aids in the past including trazodone, Lunesta, Ambien, Halcion and so on.  She had genetic testing done recently and TCAs as well as mirtazapine can have significant gene drug interaction.  Discussed Rozerem with patient.  She agrees with plan.   Visit Diagnosis:    ICD-10-CM   1. GAD (generalized anxiety disorder) F41.1   2. Insomnia due  to medical condition G47.01   3. Needle phobia F40.298   4. Sleep related cramps of lower extremity G47.62     Past Psychiatric History: I have reviewed past psychiatric history from my progress note on 03/03/2018.  Past trials of gabapentin, Klonopin-disinhibited, Ambien-sleepwalking, Wellbutrin-paradoxical reaction, Zoloft-side effects.  Past Medical History:  Past Medical History:  Diagnosis Date  . Abnormal weight gain 08/05/2013  . Absence of interventricular septum 08/05/2013  . Allergic rhinitis 04/17/2016  . Allergic urticaria due to ingested food 02/09/2016  . Anxiety   . Asthma   . Clinical depression 09/30/2013  . Complication of anesthesia    PT HAD TROUBLE BREATHING AFTER HYSTERECTOMY IN OCTOBER  . Depression   . Dizziness   . Excess weight 09/30/2013  . GERD (gastroesophageal reflux disease)   . Headache, migraine 09/30/2013  . Heart murmur   . Heavy drinker 04/11/2014   Overview:  Patient reports drinking 2-3 bottles of wine each weekend.    . History of methicillin resistant staphylococcus aureus (MRSA) 11/2015  . Infection of urinary tract 09/30/2013  . Irregular bleeding 09/30/2013  . Multiple allergies   . VSD (ventricular septal defect and aortic arch hypoplasia     Past Surgical History:  Procedure Laterality Date  . BOTOX INJECTION N/A 10/23/2016   Procedure: BOTOX INJECTION;  Surgeon: Royston Cowper, MD;  Location: [facility] ORS;  Service: Urology;  Laterality: N/A;  . BREAST BIOPSY Left 11/13/13   benign  . CESAREAN SECTION    . COLONOSCOPY    . CORONARY ANGIOPLASTY    .  CYSTOSCOPY N/A 10/23/2016   Procedure: CYSTOSCOPY;  Surgeon: Royston Cowper, MD;  Location: [facility] OR[facility];  Service: Urology;  Laterality: N/A;  . CYSTOSCOPY WITH HYDRODISTENSION AND BIOPSY    . LAPAROSCOPIC ASSISTED VAGINAL HYSTERECTOMY N/A 08/20/2016   Procedure: LAPAROSCOPIC ASSISTED VAGINAL HYSTERECTOMY;  Surgeon: Boykin Nearing, MD;  Location: [facility] OR[facility];  Service: Gynecology;   Laterality: N/A;  . LAPAROSCOPIC BILATERAL SALPINGECTOMY Bilateral 08/20/2016   Procedure: LAPAROSCOPIC BILATERAL SALPINGECTOMY;  Surgeon: Boykin Nearing, MD;  Location: [facility] OR[facility];  Service: Gynecology;  Laterality: Bilateral;  . NASAL SINUS SURGERY     x12    Family Psychiatric History: I have reviewed family psychiatric history from my progress note on 03/03/2018.  Family History:  Family History  Problem Relation Age of Onset  . Breast cancer Maternal Aunt   . Breast cancer Paternal Aunt   . Breast cancer Maternal Grandmother   . Breast cancer Paternal Grandmother   . Breast cancer Paternal Aunt   . Bipolar disorder Paternal Aunt   . Kidney cancer Maternal Uncle   . Prostate cancer Paternal Grandfather   . Kidney disease Unknown        father's side  . Aneurysm Mother   . Liver disease Mother   . Drug abuse Mother   . Alcohol abuse Mother   . Anxiety disorder Mother   . Depression Mother   . Cancer Father   . Anxiety disorder Father   . Depression Father     Social History: I have reviewed social history from my progress note on 03/03/2018 Social History   Socioeconomic History  . Marital status: Married    Spouse name: greg   . Number of children: 2  . Years of education: Not on file  . Highest education level: Associate degree: occupational, Hotel manager, or vocational program  Occupational History  . Not on file  Social Needs  . Financial resource strain: Not hard at all  . Food insecurity:    Worry: Never true    Inability: Never true  . Transportation needs:    Medical: No    Non-medical: No  Tobacco Use  . Smoking status: Former Smoker    Types: Cigarettes    Last attempt to quit: 11/12/2006    Years since quitting: 11.8  . Smokeless tobacco: Never Used  . Tobacco comment: quit 2008  Substance and Sexual Activity  . Alcohol use: No    Alcohol/week: 0.0 standard drinks  . Drug use: No  . Sexual activity: Yes    Birth control/protection: None   Lifestyle  . Physical activity:    Days per week: 0 days    Minutes per session: 0 min  . Stress: Very much  Relationships  . Social connections:    Talks on phone: Not on file    Gets together: Not on file    Attends religious service: More than 4 times per year    Active member of club or organization: No    Attends meetings of clubs or organizations: Never    Relationship status: Married  Other Topics Concern  . Not on file  Social History Narrative  . Not on file    Allergies:  Allergies  Allergen Reactions  . Bee Pollen Anaphylaxis  . Bee Venom Anaphylaxis  . Eggs Or Egg-Derived Products Anaphylaxis  . Keflex [Cephalexin] Anaphylaxis  . Lac Bovis Anaphylaxis  . Lactase Anaphylaxis and Other (See Comments)  . Milk-Related Compounds Anaphylaxis  . Penicillins Anaphylaxis  Has patient had a PCN reaction causing immediate rash, facial/tongue/throat swelling, SOB or lightheadedness with hypotension: No Has patient had a PCN reaction causing severe rash involving mucus membranes or skin necrosis: No Has patient had a PCN reaction that required hospitalization No Has patient had a PCN reaction occurring within the last 10 years: Yes If all of the above answers are "NO", then may proceed with Cephalosporin use.   . Shellfish Allergy Anaphylaxis and Other (See Comments)  . Soy Allergy Anaphylaxis  . Wheat Bran Anaphylaxis  . Codeine     Other reaction(s): RASH  . Ferrous Gluconate Diarrhea    And vomiting And vomiting  . Hydrocodone Hives  . Iron Diarrhea  . Iodine Rash  . Latex Itching and Rash    Metabolic Disorder Labs: No results found for: HGBA1C, MPG No results found for: PROLACTIN No results found for: CHOL, TRIG, HDL, CHOLHDL, VLDL, LDLCALC Lab Results  Component Value Date   TSH 5.752 (H) 09/19/2017   TSH 1.190 09/04/2016    Therapeutic Level Labs: No results found for: LITHIUM No results found for: VALPROATE No components found for:   CBMZ  Current Medications: Current Outpatient Medications  Medication Sig Dispense Refill  . albuterol (PROVENTIL HFA;VENTOLIN HFA) 108 (90 Base) MCG/ACT inhaler Inhale 2 puffs into the lungs every 6 (six) hours as needed for wheezing or shortness of breath. 1 Inhaler 2  . ALPRAZolam (XANAX) 0.5 MG tablet Take 1 tablet (0.5 mg total) by mouth as directed. Take it only for severe panic symptoms 10 tablet 0  . azelastine (OPTIVAR) 0.05 % ophthalmic solution Place 1 drop into both eyes daily.    . Azelastine HCl 0.15 % SOLN Place 2 sprays into the nose 2 (two) times daily.     . Biotin 10000 MCG TABS Take 10,000 mg by mouth daily.    . cetirizine (ZYRTEC) 10 MG tablet Take 2 tablets by mouth 2 (two) times daily.     . Cholecalciferol (VITAMIN D-1000 MAX ST) 1000 units tablet Take 1 tablet by mouth 2 (two) times daily.     Marland Kitchen CRANBERRY PO Take 1 capsule by mouth at bedtime.     Marland Kitchen desvenlafaxine (PRISTIQ) 100 MG 24 hr tablet Take 1 tablet (100 mg total) by mouth daily. 90 tablet 1  . doxycycline (MONODOX) 100 MG capsule Take 100 mg by mouth 2 (two) times daily.  0  . EPINEPHrine 0.3 mg/0.3 mL IJ SOAJ injection Inject 0.3 mLs (0.3 mg total) into the muscle once. 1 Device 1  . erythromycin ophthalmic ointment Use 1/4" ribbon to affected eye & sutures if present 2x/day for 14 days. If you develop allergy symptoms, stop & call our office.    . fluticasone (FLONASE) 50 MCG/ACT nasal spray Place 1 spray into both nostrils 2 (two) times daily.     . fluticasone (FLOVENT HFA) 220 MCG/ACT inhaler INHALE 2 PUFFS BY MOUTH TWICE DAILY    . Fluticasone-Salmeterol (ADVAIR) 250-50 MCG/DOSE AEPB Inhale 1 puff into the lungs 2 (two) times daily.    . Galcanezumab-gnlm 120 MG/ML SOAJ Inject into the skin.    . hydrochlorothiazide (HYDRODIURIL) 25 MG tablet Take 25 mg by mouth daily.    . hydrochlorothiazide (HYDRODIURIL) 25 MG tablet TAKE 1 TABLET BY MOUTH ONCE DAILY    . hydrOXYzine (ATARAX/VISTARIL) 50 MG tablet  hydroxyzine HCl 50 mg tablet    . levocetirizine (XYZAL) 5 MG tablet Take by mouth.    . levothyroxine (SYNTHROID, LEVOTHROID) 50 MCG tablet  Take 50 mcg daily, any generic manufacturer is okay but try to stay with same manufacturer as much as possible    . magnesium oxide (MAG-OX) 400 MG tablet Take 400 mg by mouth daily.    . metFORMIN (GLUCOPHAGE) 500 MG tablet Take by mouth.    . metoCLOPramide (REGLAN) 5 MG tablet TAKE 1 TABLET BY MOUTH EVERY 6 HOURS AS NEEDED FOR MIGRAINE HEADACHE  1  . montelukast (SINGULAIR) 10 MG tablet Take 10 mg by mouth at bedtime.     . Multiple Vitamins-Minerals (ZINC PO) Take 1 tablet by mouth daily.    . nitrofurantoin, macrocrystal-monohydrate, (MACROBID) 100 MG capsule Take by mouth.    Marland Kitchen ofloxacin (FLOXIN) 0.3 % OTIC solution ofloxacin 0.3 % ear drops    . omalizumab (XOLAIR) 150 MG injection INJECT 300MG  SUBCUTANEOUSLY EVERY 4 WEEKS (GIVEN AT PRESCRIBERS OFFICE)    . potassium chloride (K-DUR) 10 MEQ tablet Take by mouth.    . ramelteon (ROZEREM) 8 MG tablet Take 1 tablet (8 mg total) by mouth at bedtime. For sleep 30 tablet 1  . ranitidine (ZANTAC) 150 MG tablet Take 150 mg by mouth as directed. Takes 1 in the morning 1 before each meal and 1 before bedtime    . vitamin B-12 (CYANOCOBALAMIN) 1000 MCG tablet Take 1,000 mcg by mouth daily.    . vitamin C (ASCORBIC ACID) 500 MG tablet Take 500 mg by mouth at bedtime.     No current facility-administered medications for this visit.      Musculoskeletal: Strength & Muscle Tone: within normal limits Gait & Station: normal Patient leans: N/A  Psychiatric Specialty Exam: Review of Systems  Psychiatric/Behavioral: The patient has insomnia.   All other systems reviewed and are negative.   Blood pressure 128/82, pulse 76, temperature 98.9 F (37.2 C), temperature source Oral, weight 175 lb 9.6 oz (79.7 kg), last menstrual period 07/17/2016.Body mass index is 32.12 kg/m.  General Appearance: Casual  Eye  Contact:  Fair  Speech:  Clear and Coherent  Volume:  Normal  Mood:  Euthymic  Affect:  Congruent  Thought Process:  Goal Directed and Descriptions of Associations: Intact  Orientation:  Full (Time, Place, and Person)  Thought Content: Logical   Suicidal Thoughts:  No  Homicidal Thoughts:  No  Memory:  Immediate;   Fair Recent;   Fair Remote;   Fair  Judgement:  Fair  Insight:  Fair  Psychomotor Activity:  Normal  Concentration:  Concentration: Fair and Attention Span: Fair  Recall:  [employer] of Knowledge: Fair  Language: Fair  Akathisia:  No  Handed:  Right  AIMS (if indicated): denies tremors, rigidity,stiffness  Assets:  Communication Skills Desire for Improvement Social Support  ADL's:  Intact  Cognition: WNL  Sleep:  Poor   Screenings:   Assessment and Plan: Taisa is a 47 year old female, has a history of anxiety, multiple allergies like urticaria, recent diagnosis of basal cell carcinoma, history of elevated TSH, presented to the clinic today for a follow-up visit.  Patient continues to struggle with sleep problems.  Discussed the following medication changes as noted below.  Plan GAD Continue Pristiq 100 mg p.o. daily. Continue psychotherapy.    For insomnia Start Rozerem 8 mg p.o. Qhs. Patient had sleep study done on 09/16/2018-does not have obstructive sleep apnea.  She was advised to lose weight.  However if there continues to be significant concern for possible sleep apnea would recommend an attended in lab routine overnight polysomnogram in supine  position following a night of sleep restriction.  For needle phobia Patient will continue psychotherapy sessions.  Lower extremity cramps She reports this as chronic. Discussed with patient to reach out to her neurologist. Patient also has a history of elevated TSH which can also cause lower extremity cramps in the setting of abnormal thyroid function. Patient has tried and failed several medications in the  past. We will continue to monitor closely.  Follow-up in clinic in 4 weeks or sooner if needed.  More than 50 % of the time was spent for psychoeducation and supportive psychotherapy and care coordination.  This note was generated in part or whole with voice recognition software. Voice recognition is usually quite accurate but there are transcription errors that can and very often do occur. I apologize for any typographical errors that were not detected and corrected.           Ursula Alert, MD 09/22/2018, 2:51 PM

## 2018-09-22 NOTE — Progress Notes (Signed)
   THERAPIST PROGRESS NOTE  Session Time: 130  Participation Level: Active  Behavioral Response: NeatAlertDepressed  Type of Therapy: Individual Therapy  Treatment Goals addressed: Anxiety  Interventions: CBT  Summary: Kristine [name] is a 47 y.o. female who presents with symptoms related to her diagnosis. Logann reports things have been, "about the same." She reports being able to complete a procedure to address her migraines, which she stated has helped with her migraines significantly. Robyn reports her communication with her daughter has not improved and stated, "she still talks to me like a dog." We discussed ways to implement appropriate consequences when her daughter is disrespectful. Jordyne reported, "I just feel like this is my punishment for something." This led to a discussion focusing on the things Mckala is doing well in her life, which helped her recognize the ways she has succeeded throughout her life. Cleatus reported "some drama going on with Zoe's cheer group, but I'm trying to stay out of it because last time I got involved they all blamed me and said I stuck my nose in where it didn't belong." We discussed boundaries and what appropriate concern looks like when it relates to another child.   Suicidal/Homicidal: No  Therapist Response: Kaizley reports her anxiety has improved, but reports her overall mood remains the same. Salote is able to make connections during our session, and is able to speak openly and honestly about her feelings. We will continue to to utilize CBT to manage Jhanvi's anxiety and depression symptoms.   Plan: Return again in 2 weeks.  Diagnosis: Axis I: Generalized Anxiety Disorder    Axis II: No diagnosis    [name], LCSW 09/22/2018

## 2018-09-29 ENCOUNTER — Ambulatory Visit: Payer: Self-pay | Admitting: Licensed Clinical Social Worker

## 2018-09-29 DIAGNOSIS — R51 Headache: Secondary | ICD-10-CM | POA: Diagnosis not present

## 2018-09-29 DIAGNOSIS — M9901 Segmental and somatic dysfunction of cervical region: Secondary | ICD-10-CM | POA: Diagnosis not present

## 2018-09-29 DIAGNOSIS — Z6831 Body mass index (BMI) 31.0-31.9, adult: Secondary | ICD-10-CM | POA: Diagnosis not present

## 2018-09-29 DIAGNOSIS — Q210 Ventricular septal defect: Secondary | ICD-10-CM | POA: Diagnosis not present

## 2018-09-29 DIAGNOSIS — E669 Obesity, unspecified: Secondary | ICD-10-CM | POA: Diagnosis not present

## 2018-09-29 DIAGNOSIS — L501 Idiopathic urticaria: Secondary | ICD-10-CM | POA: Diagnosis not present

## 2018-09-29 DIAGNOSIS — Z724 Inappropriate diet and eating habits: Secondary | ICD-10-CM | POA: Diagnosis not present

## 2018-09-29 DIAGNOSIS — M9905 Segmental and somatic dysfunction of pelvic region: Secondary | ICD-10-CM | POA: Diagnosis not present

## 2018-09-29 NOTE — Telephone Encounter (Signed)
Patient called asking about prior authorization for the med Rozerem 8mg .

## 2018-10-07 ENCOUNTER — Telehealth: Payer: Self-pay | Admitting: Psychiatry

## 2018-10-07 ENCOUNTER — Telehealth: Payer: Self-pay

## 2018-10-07 NOTE — Telephone Encounter (Signed)
Returned call to patient.left message to call back regarding sleep medication options.

## 2018-10-07 NOTE — Telephone Encounter (Signed)
received fax from pharmacy that insurance wont cover the ramelteon 8mg  .

## 2018-10-07 NOTE — Telephone Encounter (Signed)
Called patient again, left voicemail.

## 2018-10-07 NOTE — Telephone Encounter (Signed)
pt called left message need to know what to take since pa not approved.

## 2018-10-13 ENCOUNTER — Ambulatory Visit: Payer: 59 | Admitting: Licensed Clinical Social Worker

## 2018-10-13 DIAGNOSIS — G540 Brachial plexus disorders: Secondary | ICD-10-CM | POA: Diagnosis not present

## 2018-10-13 DIAGNOSIS — M9901 Segmental and somatic dysfunction of cervical region: Secondary | ICD-10-CM | POA: Diagnosis not present

## 2018-10-13 DIAGNOSIS — R51 Headache: Secondary | ICD-10-CM | POA: Diagnosis not present

## 2018-10-23 DIAGNOSIS — L501 Idiopathic urticaria: Secondary | ICD-10-CM | POA: Diagnosis not present

## 2018-10-27 ENCOUNTER — Encounter: Payer: Self-pay | Admitting: Psychiatry

## 2018-10-27 ENCOUNTER — Other Ambulatory Visit: Payer: Self-pay

## 2018-10-27 ENCOUNTER — Encounter: Payer: Self-pay | Admitting: Licensed Clinical Social Worker

## 2018-10-27 ENCOUNTER — Ambulatory Visit (INDEPENDENT_AMBULATORY_CARE_PROVIDER_SITE_OTHER): Payer: 59 | Admitting: Licensed Clinical Social Worker

## 2018-10-27 ENCOUNTER — Ambulatory Visit (INDEPENDENT_AMBULATORY_CARE_PROVIDER_SITE_OTHER): Payer: 59 | Admitting: Psychiatry

## 2018-10-27 VITALS — BP 125/78 | HR 75 | Temp 99.1°F | Wt 179.2 lb

## 2018-10-27 DIAGNOSIS — E039 Hypothyroidism, unspecified: Secondary | ICD-10-CM | POA: Diagnosis not present

## 2018-10-27 DIAGNOSIS — L501 Idiopathic urticaria: Secondary | ICD-10-CM | POA: Diagnosis not present

## 2018-10-27 DIAGNOSIS — F411 Generalized anxiety disorder: Secondary | ICD-10-CM

## 2018-10-27 DIAGNOSIS — G4701 Insomnia due to medical condition: Secondary | ICD-10-CM | POA: Diagnosis not present

## 2018-10-27 DIAGNOSIS — Q210 Ventricular septal defect: Secondary | ICD-10-CM | POA: Diagnosis not present

## 2018-10-27 DIAGNOSIS — J45909 Unspecified asthma, uncomplicated: Secondary | ICD-10-CM | POA: Diagnosis not present

## 2018-10-27 DIAGNOSIS — E669 Obesity, unspecified: Secondary | ICD-10-CM | POA: Diagnosis not present

## 2018-10-27 MED ORDER — TEMAZEPAM 7.5 MG PO CAPS: 7.5000 mg | ORAL_CAPSULE | Freq: Every evening | ORAL | 0 refills | Status: DC | PRN

## 2018-10-27 MED ORDER — BUSPIRONE HCL 5 MG PO TABS: 5.0000 mg | ORAL_TABLET | Freq: Every day | ORAL | 1 refills | Status: DC

## 2018-10-27 NOTE — Progress Notes (Signed)
[place] MD OP Progress Note  10/27/2018 12:18 PM Kristine [name]  MRN:  [id number]  Chief Complaint: ' I am here for follow up.' Chief Complaint    Follow-up     HPI: Kristine [name] is a 47 year old female, married, employed, lives in [place], has a history of multiple medical problems like anxiety disorder, headaches, multiple allergies, VSD, chronic urticaria/anaphylaxis, elevated TSH, GERD, basal cell carcinoma, presented to the clinic today for a follow-up visit.  Patient today reports she continues to struggle with muscle spasms and cramps.  She reports she was told by another provider that it may be due to her high anxiety which could be causing cramps.  She reports she has these cramps throughout the day and even at night.  Discussed medications to help with her anxiety symptoms and to augment the Pristiq.  Patient has a history of being sensitive to medications in general.  Patient reports she does not want to try Klonopin on medications and its class for her anxiety symptoms.  She already takes hydroxyzine for her allergies and does not think it will help.  Discussed adding BuSpar to augment the Pristiq.  She reports she will give it a try.  We will start a small dose of BuSpar to see if she will tolerate it.  Patient to call writer back in 10 to 15 days if she tolerates it well.  Patient continues to struggle with sleep.  She could not fill the Rozerem since it was expensive.  Discussed adding temazepam.  [facility] testing results were reviewed while making medication choices today.  Discussed with patient that temazepam is a controlled substance and it can be habit-forming.  Discussed with patient to limit use as much as she can.  Patient denies any suicidality.  Patient denies any perceptual disturbances.  Discussed with patient regarding referral to a neurologist for muscle spasms or cramps.  She reports she will reach out to her neurologist at this time.  Visit Diagnosis:    ICD-10-CM    1. GAD (generalized anxiety disorder) F41.1 busPIRone (BUSPAR) 5 MG tablet  2. Insomnia due to medical condition G47.01 temazepam (RESTORIL) 7.5 MG capsule    Past Psychiatric History: I have reviewed past psychiatric history from my progress note on 03/03/2018.  Past trials of gabapentin, Klonopin-disinhibited, Ambien-sleepwalking, Wellbutrin-paradoxical reaction, Zoloft-side effects, trazodone-unknown, gabapentin, Requip  Past Medical History:  Past Medical History:  Diagnosis Date  . Abnormal weight gain 08/05/2013  . Absence of interventricular septum 08/05/2013  . Allergic rhinitis 04/17/2016  . Allergic urticaria due to ingested food 02/09/2016  . Anxiety   . Asthma   . Clinical depression 09/30/2013  . Complication of anesthesia    PT HAD TROUBLE BREATHING AFTER HYSTERECTOMY IN OCTOBER  . Depression   . Dizziness   . Excess weight 09/30/2013  . GERD (gastroesophageal reflux disease)   . Headache, migraine 09/30/2013  . Heart murmur   . Heavy drinker 04/11/2014   Overview:  Patient reports drinking 2-3 bottles of wine each weekend.    . History of methicillin resistant staphylococcus aureus (MRSA) 11/2015  . Infection of urinary tract 09/30/2013  . Irregular bleeding 09/30/2013  . Multiple allergies   . VSD (ventricular septal defect and aortic arch hypoplasia     Past Surgical History:  Procedure Laterality Date  . BOTOX INJECTION N/A 10/23/2016   Procedure: BOTOX INJECTION;  Surgeon: Kristine Cowper, MD;  Location: [facility] OR[facility];  Service: Urology;  Laterality: N/A;  . BREAST BIOPSY Left 11/13/13  benign  . CESAREAN SECTION    . COLONOSCOPY    . CORONARY ANGIOPLASTY    . CYSTOSCOPY N/A 10/23/2016   Procedure: CYSTOSCOPY;  Surgeon: Kristine Cowper, MD;  Location: [facility];  Service: Urology;  Laterality: N/A;  . CYSTOSCOPY WITH HYDRODISTENSION AND BIOPSY    . LAPAROSCOPIC ASSISTED VAGINAL HYSTERECTOMY N/A 08/20/2016   Procedure: LAPAROSCOPIC ASSISTED VAGINAL HYSTERECTOMY;   Surgeon: Kristine Nearing, MD;  Location: [facility] OR[facility];  Service: Gynecology;  Laterality: N/A;  . LAPAROSCOPIC BILATERAL SALPINGECTOMY Bilateral 08/20/2016   Procedure: LAPAROSCOPIC BILATERAL SALPINGECTOMY;  Surgeon: Kristine Nearing, MD;  Location: [facility];  Service: Gynecology;  Laterality: Bilateral;  . NASAL SINUS SURGERY     x12    Family Psychiatric History: Reviewed family psychiatric history from my progress note on 03/03/2018  Family History:  Family History  Problem Relation Age of Onset  . Breast cancer Maternal Aunt   . Breast cancer Paternal Aunt   . Breast cancer Maternal Grandmother   . Breast cancer Paternal Grandmother   . Breast cancer Paternal Aunt   . Bipolar disorder Paternal Aunt   . Kidney cancer Maternal Uncle   . Prostate cancer Paternal Grandfather   . Kidney disease Unknown        father's side  . Aneurysm Mother   . Liver disease Mother   . Drug abuse Mother   . Alcohol abuse Mother   . Anxiety disorder Mother   . Depression Mother   . Cancer Father   . Anxiety disorder Father   . Depression Father     Social History: Reviewed social history from my progress note on 03/03/2018. Social History   Socioeconomic History  . Marital status: Married    Spouse name: greg   . Number of children: 2  . Years of education: Not on file  . Highest education level: Associate degree: occupational, Hotel manager, or vocational program  Occupational History  . Not on file  Social Needs  . Financial resource strain: Not hard at all  . Food insecurity:    Worry: Never true    Inability: Never true  . Transportation needs:    Medical: No    Non-medical: No  Tobacco Use  . Smoking status: Former Smoker    Types: Cigarettes    Last attempt to quit: 11/12/2006    Years since quitting: 11.9  . Smokeless tobacco: Never Used  . Tobacco comment: quit 2008  Substance and Sexual Activity  . Alcohol use: No    Alcohol/week: 0.0 standard drinks  . Drug use: No   . Sexual activity: Yes    Birth control/protection: None  Lifestyle  . Physical activity:    Days per week: 0 days    Minutes per session: 0 [name]  . Stress: Very much  Relationships  . Social connections:    Talks on phone: Not on file    Gets together: Not on file    Attends religious service: More than 4 times per year    Active member of club or organization: No    Attends meetings of clubs or organizations: Never    Relationship status: Married  Other Topics Concern  . Not on file  Social History Narrative  . Not on file    Allergies:  Allergies  Allergen Reactions  . Bee Pollen Anaphylaxis  . Bee Venom Anaphylaxis  . Eggs Or Egg-Derived Products Anaphylaxis  . Keflex [Cephalexin] Anaphylaxis  . Lac Bovis Anaphylaxis  . Lactase Anaphylaxis  and Other (See Comments)  . Milk-Related Compounds Anaphylaxis  . Penicillins Anaphylaxis    Has patient had a PCN reaction causing immediate rash, facial/tongue/throat swelling, SOB or lightheadedness with hypotension: No Has patient had a PCN reaction causing severe rash involving mucus membranes or skin necrosis: No Has patient had a PCN reaction that required hospitalization No Has patient had a PCN reaction occurring within the last 10 years: Yes If all of the above answers are "NO", then may proceed with Cephalosporin use.   . Shellfish Allergy Anaphylaxis and Other (See Comments)  . Soy Allergy Anaphylaxis  . Wheat Bran Anaphylaxis  . Codeine     Other reaction(s): RASH  . Ferrous Gluconate Diarrhea    And vomiting And vomiting  . Hydrocodone Hives  . Iron Diarrhea  . Iodine Rash  . Latex Itching and Rash    Metabolic Disorder Labs: No results found for: HGBA1C, MPG No results found for: PROLACTIN No results found for: CHOL, TRIG, HDL, CHOLHDL, VLDL, LDLCALC Lab Results  Component Value Date   TSH 5.752 (H) 09/19/2017   TSH 1.190 09/04/2016    Therapeutic Level Labs: No results found for: LITHIUM No  results found for: VALPROATE No components found for:  CBMZ  Current Medications: Current Outpatient Medications  Medication Sig Dispense Refill  . albuterol (PROVENTIL HFA;VENTOLIN HFA) 108 (90 Base) MCG/ACT inhaler Inhale 2 puffs into the lungs every 6 (six) hours as needed for wheezing or shortness of breath. 1 Inhaler 2  . ALPRAZolam (XANAX) 0.5 MG tablet Take 1 tablet (0.5 mg total) by mouth as directed. Take it only for severe panic symptoms 10 tablet 0  . azelastine (OPTIVAR) 0.05 % ophthalmic solution Place 1 drop into both eyes daily.    . Azelastine HCl 0.15 % SOLN Place 2 sprays into the nose 2 (two) times daily.     . Biotin 10000 MCG TABS Take 10,000 mg by mouth daily.    . busPIRone (BUSPAR) 5 MG tablet Take 1 tablet (5 mg total) by mouth daily. 15 tablet 1  . cetirizine (ZYRTEC) 10 MG tablet Take 2 tablets by mouth 2 (two) times daily.     . Cholecalciferol (VITAMIN D-1000 MAX ST) 1000 units tablet Take 1 tablet by mouth 2 (two) times daily.     Marland Kitchen CRANBERRY PO Take 1 capsule by mouth at bedtime.     Marland Kitchen desvenlafaxine (PRISTIQ) 100 MG 24 hr tablet Take 1 tablet (100 mg total) by mouth daily. 90 tablet 1  . doxycycline (MONODOX) 100 MG capsule Take 100 mg by mouth 2 (two) times daily.  0  . EPINEPHrine 0.3 mg/0.3 mL IJ SOAJ injection Inject 0.3 mLs (0.3 mg total) into the muscle once. 1 Device 1  . erythromycin ophthalmic ointment Use 1/4" ribbon to affected eye & sutures if present 2x/day for 14 days. If you develop allergy symptoms, stop & call our office.    . fluticasone (FLONASE) 50 MCG/ACT nasal spray Place 1 spray into both nostrils 2 (two) times daily.     . fluticasone (FLOVENT HFA) 220 MCG/ACT inhaler INHALE 2 PUFFS BY MOUTH TWICE DAILY    . Fluticasone-Salmeterol (ADVAIR) 250-50 MCG/DOSE AEPB Inhale 1 puff into the lungs 2 (two) times daily.    . Galcanezumab-gnlm 120 MG/ML SOAJ Inject into the skin.    . hydrochlorothiazide (HYDRODIURIL) 25 MG tablet Take 25 mg by mouth  daily.    . hydrochlorothiazide (HYDRODIURIL) 25 MG tablet TAKE 1 TABLET BY MOUTH ONCE DAILY    .  hydrOXYzine (ATARAX/VISTARIL) 50 MG tablet hydroxyzine HCl 50 mg tablet    . levocetirizine (XYZAL) 5 MG tablet Take by mouth.    . levothyroxine (SYNTHROID, LEVOTHROID) 50 MCG tablet Take 50 mcg daily, any generic manufacturer is okay but try to stay with same manufacturer as much as possible    . magnesium oxide (MAG-OX) 400 MG tablet Take 400 mg by mouth daily.    . metFORMIN (GLUCOPHAGE) 500 MG tablet Take by mouth.    . metoCLOPramide (REGLAN) 5 MG tablet TAKE 1 TABLET BY MOUTH EVERY 6 HOURS AS NEEDED FOR MIGRAINE HEADACHE  1  . montelukast (SINGULAIR) 10 MG tablet Take 10 mg by mouth at bedtime.     . Multiple Vitamins-Minerals (ZINC PO) Take 1 tablet by mouth daily.    . nitrofurantoin, macrocrystal-monohydrate, (MACROBID) 100 MG capsule Take by mouth.    Marland Kitchen ofloxacin (FLOXIN) 0.3 % OTIC solution ofloxacin 0.3 % ear drops    . omalizumab (XOLAIR) 150 MG injection INJECT 300MG  SUBCUTANEOUSLY EVERY 4 WEEKS (GIVEN AT PRESCRIBERS OFFICE)    . potassium chloride (K-DUR) 10 MEQ tablet Take by mouth.    . ranitidine (ZANTAC) 150 MG tablet Take 150 mg by mouth as directed. Takes 1 in the morning 1 before each meal and 1 before bedtime    . temazepam (RESTORIL) 7.5 MG capsule Take 1 capsule (7.5 mg total) by mouth at bedtime as needed for sleep. 30 capsule 0  . vitamin B-12 (CYANOCOBALAMIN) 1000 MCG tablet Take 1,000 mcg by mouth daily.    . vitamin C (ASCORBIC ACID) 500 MG tablet Take 500 mg by mouth at bedtime.     No current facility-administered medications for this visit.      Musculoskeletal: Strength & Muscle Tone: within normal limits Gait & Station: normal Patient leans: N/A  Psychiatric Specialty Exam: Review of Systems  Psychiatric/Behavioral: The patient is not nervous/anxious and does not have insomnia.   All other systems reviewed and are negative.   Blood pressure 125/78,  pulse 75, temperature 99.1 F (37.3 C), temperature source Oral, weight 179 lb 3.2 oz (81.3 kg), last menstrual period 07/17/2016.Body mass index is 32.78 kg/m.  General Appearance: Casual  Eye Contact:  Fair  Speech:  Clear and Coherent  Volume:  Normal  Mood:  Anxious  Affect:  Congruent  Thought Process:  Goal Directed and Descriptions of Associations: Intact  Orientation:  Full (Time, Place, and Person)  Thought Content: Logical   Suicidal Thoughts:  No  Homicidal Thoughts:  No  Memory:  Immediate;   Fair Recent;   Fair Remote;   Fair  Judgement:  Fair  Insight:  Fair  Psychomotor Activity:  Normal  Concentration:  Concentration: Fair and Attention Span: Fair  Recall:  [employer] of Knowledge: Fair  Language: Fair  Akathisia:  No  Handed:  Right  AIMS (if indicated): denies rigidity,stiffness  Assets:  Communication Skills Desire for Improvement Social Support  ADL's:  Intact  Cognition: WNL  Sleep:  Poor   Screenings:   Assessment and Plan: Kristine [name] is a 47 year old female, has a history of anxiety, multiple allergies like urticaria, recent diagnosis of basal cell carcinoma, history of elevated TSH, presented to the clinic today for a follow-up visit.  Patient continues to struggle with sleep problems.  We will continue to make medication changes.  Plan GAD Continue Pristiq 100 mg p.o. daily Continue psychotherapy. Add BuSpar 5 mg p.o. daily.  She will call writer back in 10 days if  she tolerates the lower dose and at that time her dosage can be readjusted.  For insomnia Discontinue Rozerem due to cost. Start temazepam 7.5 mg p.o. nightly Patient had sleep study done on 09/16/2018-she was advised to lose weight and she does not have OSA at this time. If she continues to have problems and possible concern of sleep apnea, then in lab routine overnight polysomnogram in supine position following a night of sleep restriction may be done - per sleep consult.  For  needle phobia Patient will continue psychotherapy sessions.  Follow-up in clinic in 1 month or sooner if needed.  More than 50 % of the time was spent for psychoeducation and supportive psychotherapy and care coordination.  This note was generated in part or whole with voice recognition software. Voice recognition is usually quite accurate but there are transcription errors that can and very often do occur. I apologize for any typographical errors that were not detected and corrected.        Ursula Alert, MD 10/27/2018, 12:18 PM

## 2018-10-27 NOTE — Progress Notes (Signed)
   THERAPIST PROGRESS NOTE  Session Time: 1000  Participation Level: Active  Behavioral Response: Well GroomedAlertAnxious  Type of Therapy: Individual Therapy  Treatment Goals addressed: Anxiety  Interventions: CBT  Summary: Kristine [name] is a 47 y.o. female who presents with symptoms related to her diagnosis. Kristine [name] reports things have been going well since our last session. She reports her son suddenly got out of prison, and has been living with her for the past three weeks. She stated it was difficult for the first few days, "and he messed up once, but other than that, things have been really good with him." LCSW provided pt with a few community resources that may help her son transition back into the community ([facility] and Medication management clinic info). Kristine [name] reports her relationship with her daughter seems to have improved in the last two weeks, "whenever she asks me to do something, I just tell her to do it herself and then we don't argue about it. It's amazing." Kristine [name] reported some trouble sleeping, but reported she believes it is primarily anxiety keeping her awake. LCSW suggested ways to relax before bed, including making lists of things to do the next day, visualization techniques, and mindfulness activities. Kristine [name] expressed understanding and agreement with these ideas.   Suicidal/Homicidal: No  Therapist Response: Kristine [name] is able to speak openly and honestly about her feelings and symptoms, and is able to utilize skills learned in previous sessions. We will continue to utilize CBT moving forward to assist Shantell in managing her anxiety symptoms.   Plan: Return again in 4 weeks.  Diagnosis: Axis I: Generalized Anxiety Disorder    Axis II: No diagnosis    [name], LCSW 10/27/2018

## 2018-10-27 NOTE — Patient Instructions (Signed)
Temazepam tablets or capsules What is this medicine? TEMAZEPAM (te MAZ e pam) is a benzodiazepine. It is used to help you to fall asleep and sleep through the night. This medicine may be used for other purposes; ask your health care provider or pharmacist if you have questions. COMMON BRAND NAME(S): Restoril What should I tell my health care provider before I take this medicine? They need to know if you have any of these conditions: -an alcohol or drug abuse problem -bipolar disorder, depression, psychosis or other mental health condition -kidney disease -liver disease -lung or breathing disease -myasthenia gravis -Parkinson's disease -porphyria -seizures or a history of seizures -suicidal thoughts -an unusual or allergic reaction to temazepam, other benzodiazepines, other medicines, foods, dyes, or preservatives -pregnant or trying to get pregnant -breast-feeding How should I use this medicine? Take this medicine by mouth. It is only for use at bedtime. Follow the directions on the prescription label. Swallow the tablets or capsules with a drink of water. If it upsets your stomach, take it with food or milk. Do not take your medicine more often than directed. Do not stop taking except on the advice of your doctor or health care professional. A special MedGuide will be given to you by the pharmacist with each prescription and refill. Be sure to read this information carefully each time. Talk to your pediatrician regarding the use of this medicine in children. Special care may be needed. Overdosage: If you think you have taken too much of this medicine contact a poison control center or emergency room at once. NOTE: This medicine is only for you. Do not share this medicine with others. What if I miss a dose? If you miss a dose, take it as soon as you can. If it is almost time for your next dose, take only that dose. Do not take double or extra doses. What may interact with this  medicine? Do not take this medicine with any of the following medications: -narcotic medicines for cough -sodium oxybate This medicine may also interact with the following medications: -alcohol -antihistamines for allergy, cough and cold -certain medicines for anxiety or sleep -certain medicines for depression, like amitriptyline, fluoxetine, sertraline -certain medicines for seizures like phenobarbital, primidone -general anesthetics like lidocaine, pramoxine, tetracaine -medicines that relax muscles for surgery -narcotic medicines for pain -phenothiazines like chlorpromazine, mesoridazine, prochlorperazine, thioridazine This list may not describe all possible interactions. Give your health care provider a list of all the medicines, herbs, non-prescription drugs, or dietary supplements you use. Also tell them if you smoke, drink alcohol, or use illegal drugs. Some items may interact with your medicine. What should I watch for while using this medicine? Tell your doctor or health care professional if your symptoms do not start to get better or if they get worse. Do not stop taking except on your doctor's advice. You may develop a severe reaction. Your doctor will tell you how much medicine to take. You may get drowsy or dizzy. Do not drive, use machinery, or do anything that needs mental alertness until you know how this medicine affects you. Do not stand or sit up quickly, especially if you are an older patient. This reduces the risk of dizzy or fainting spells. Alcohol may interfere with the effect of this medicine. Avoid alcoholic drinks. If you are taking another medicine that also causes drowsiness, you may have more side effects. Give your health care provider a list of all medicines you use. Your doctor will tell you how  much medicine to take. Do not take more medicine than directed. Call emergency for help if you have problems breathing or unusual sleepiness. After taking this medicine  for sleep, you may get up out of bed while not being fully awake and do an activity that you do not know you are doing. The next morning, you may have no memory of the event. Activities such as driving a car ("sleep-driving"), making and eating food, talking on the phone, sexual activity, and sleep-walking have been reported. Call your doctor right away if you find out you have done any of these activities. Do not take this medicine if you have used alcohol that evening or before bed or taken another medicine for sleep since your risk of doing these sleep-related activities will be increased. Do not take this medicine unless you are able to stay in bed for a full night (7 to 8 hour) before you must be active again. You may have a decrease in mental alertness the day after use, even if you feel that you are fully awake. Tell your doctor if you will need to perform activities requiring full alertness, such as driving, the next day. Do not stand or sit up quickly after taking this medicine, especially if you are an older patient. This reduces the risk of dizzy or fainting spells. If you or your family notice any changes in your behavior, such as new or worsening depression, thoughts of harming yourself, anxiety, other unusual or disturbing thoughts, or memory loss, call your doctor right away. After you stop taking this medicine, you may have trouble falling asleep. This is called rebound insomnia. This problem usually goes away on its own after 1 or 2 nights. Women should inform their doctor if they wish to become pregnant or think they might be pregnant. There is a potential for serious side effects to an unborn child. Talk to your health care professional or pharmacist for more information. What side effects may I notice from receiving this medicine? Side effects that you should report to your doctor or health care professional as soon as possible: -allergic reactions like skin rash, itching or hives,  swelling of the face, lips, or tongue -breathing problems -confusion -loss of balance or coordination -signs and symptoms of low blood pressure like dizziness; feeling faint or lightheaded, falls; unusually weak or tired -suicidal thoughts or other mood changes -unusual activities while asleep like driving, eating, making phone calls Side effects that usually do not require medical attention (report to your doctor or health care professional if they continue or are bothersome): -diarrhea -dizziness -nausea, vomiting -tiredness This list may not describe all possible side effects. Call your doctor for medical advice about side effects. You may report side effects to FDA at 1-800-FDA-1088. Where should I keep my medicine? Keep out of the reach of children. This medicine can be abused. Keep your medicine in a safe place to protect it from theft. Do not share this medicine with anyone. Selling or giving away this medicine is dangerous and against the law. This medicine may cause accidental overdose and death if taken by other adults, children, or pets. Mix any unused medicine with a substance like cat litter or coffee grounds. Then throw the medicine away in a sealed container like a sealed bag or a coffee can with a lid. Do not use the medicine after the expiration date. Store at room temperature below 30 degrees C (86 degrees F). Protect from light. Keep container tightly closed. NOTE:  This sheet is a summary. It may not cover all possible information. If you have questions about this medicine, talk to your doctor, pharmacist, or health care provider.  2018 Elsevier/Gold Standard (2015-07-28 23:44:07)

## 2018-11-11 ENCOUNTER — Telehealth: Payer: Self-pay

## 2018-11-11 MED ORDER — BUSPIRONE HCL 10 MG PO TABS: 10.0000 mg | ORAL_TABLET | Freq: Three times a day (TID) | ORAL | 0 refills | Status: DC

## 2018-11-11 NOTE — Telephone Encounter (Signed)
Sent Buspar 10 mg tid to pharmacy. Spoke to patient, provided instructions to start with 10 mg and increase to bid or tid as needed.

## 2018-11-11 NOTE — Telephone Encounter (Signed)
Patient called to inform Dr. Shea Evans that she has been taking the Buspirone 5mg s, and she didn't have any type of allergic reaction to the medication.   FYI

## 2018-11-18 ENCOUNTER — Telehealth: Payer: Self-pay

## 2018-11-18 NOTE — Telephone Encounter (Signed)
Medication problem - Telephone call with pt to follow up on call she was having some problems with increased Buspar.  Patient stated she was taking the 10 mg twice a day now but was more tired, "snapping more" towards others, anxious and has a migraine.  Patient states she was not sure if she was just getting sick and "coming down with something or if it is the medicine"  Stated she was just taking 5 mg once a day and questions if medications may be causing symptoms and she should go back down on dosage.  Agreed to send information to Dr. Shea Evans with questions and to call patient back with recommendation.

## 2018-11-18 NOTE — Telephone Encounter (Signed)
Called patient back , she is having side effects to buspar. Discussed to either stop it or reduce to previous dosage of 5 mg.

## 2018-11-20 DIAGNOSIS — L501 Idiopathic urticaria: Secondary | ICD-10-CM | POA: Diagnosis not present

## 2018-11-24 DIAGNOSIS — L501 Idiopathic urticaria: Secondary | ICD-10-CM | POA: Diagnosis not present

## 2018-11-26 DIAGNOSIS — J069 Acute upper respiratory infection, unspecified: Secondary | ICD-10-CM | POA: Diagnosis not present

## 2018-12-08 ENCOUNTER — Ambulatory Visit: Payer: 59 | Admitting: Psychiatry

## 2018-12-08 ENCOUNTER — Ambulatory Visit: Payer: 59 | Admitting: Licensed Clinical Social Worker

## 2018-12-08 DIAGNOSIS — G43709 Chronic migraine without aura, not intractable, without status migrainosus: Secondary | ICD-10-CM | POA: Diagnosis not present

## 2018-12-08 DIAGNOSIS — Z87898 Personal history of other specified conditions: Secondary | ICD-10-CM | POA: Diagnosis not present

## 2018-12-12 ENCOUNTER — Encounter: Payer: Self-pay | Admitting: Psychiatry

## 2018-12-12 ENCOUNTER — Ambulatory Visit (INDEPENDENT_AMBULATORY_CARE_PROVIDER_SITE_OTHER): Payer: 59 | Admitting: Psychiatry

## 2018-12-12 ENCOUNTER — Other Ambulatory Visit: Payer: Self-pay

## 2018-12-12 VITALS — BP 118/71 | HR 83 | Temp 97.7°F | Wt 178.4 lb

## 2018-12-12 DIAGNOSIS — F411 Generalized anxiety disorder: Secondary | ICD-10-CM

## 2018-12-12 DIAGNOSIS — G4701 Insomnia due to medical condition: Secondary | ICD-10-CM | POA: Diagnosis not present

## 2018-12-12 DIAGNOSIS — F40298 Other specified phobia: Secondary | ICD-10-CM | POA: Diagnosis not present

## 2018-12-12 MED ORDER — BUSPIRONE HCL 10 MG PO TABS: 5.0000 mg | ORAL_TABLET | Freq: Every day | ORAL | 0 refills | Status: DC

## 2018-12-12 NOTE — Progress Notes (Signed)
[facility] Progress Note  12/12/2018 12:37 PM ELEISHA BRANSCOMB  MRN:  [id number]  Chief Complaint: ' I am here for follow up." Chief Complaint    Follow-up; Medication Refill; Medication Problem     HPI: Addylin is a 48 year old female, married, employed, lives in [place], has a history of multiple medical problems like anxiety disorder, headaches, multiple allergies, VSD, chronic urticaria/anaphylaxis, elevated TSH, GERD, basal cell carcinoma, needle phobia, presented to clinic today for a follow-up visit.  Patient today reports she continues to struggle with some anxiety symptoms and mood lability on and off.  She reports she continues to take the Pristiq.  Denies any side effects to the Pristiq.  Patient reports she is currently on a reduced dosage of BuSpar.  She takes 5 mg daily.  She tried a higher dose for anxiety symptoms however she had side effects on the same.  Patient reports she continues to have some restlessness however sleep overall is okay.  She does not follow good sleep hygiene.  This was discussed at length with patient again.  She has temazepam available however she does not take it every night since she feels it makes her groggy in the morning.  She does not want any sleep medication changes today.  Patient reports she is very concerned about her weight and has been working with a provider for the same.  She has been following a healthy diet.  She has not been able to exercise due to her work schedule.  Patient denies any suicidality, perceptual disturbances. Visit Diagnosis:    ICD-10-CM   1. GAD (generalized anxiety disorder) F41.1   2. Insomnia due to medical condition G47.01   3. Needle phobia F40.298     Past Psychiatric History: Reviewed past psychiatric history from my progress note on 03/03/2018.  Past trials of gabapentin, Klonopin-disinhibited, Ambien, sleepwalking, Wellbutrin-paradoxical reaction, Zoloft-side effects, trazodone-unknown, gabapentin,  Requip  Past Medical History:  Past Medical History:  Diagnosis Date  . Abnormal weight gain 08/05/2013  . Absence of interventricular septum 08/05/2013  . Allergic rhinitis 04/17/2016  . Allergic urticaria due to ingested food 02/09/2016  . Anxiety   . Asthma   . Clinical depression 09/30/2013  . Complication of anesthesia    PT HAD TROUBLE BREATHING AFTER HYSTERECTOMY IN OCTOBER  . Depression   . Dizziness   . Excess weight 09/30/2013  . GERD (gastroesophageal reflux disease)   . Headache, migraine 09/30/2013  . Heart murmur   . Heavy drinker 04/11/2014   Overview:  Patient reports drinking 2-3 bottles of wine each weekend.    . History of methicillin resistant staphylococcus aureus (MRSA) 11/2015  . Infection of urinary tract 09/30/2013  . Irregular bleeding 09/30/2013  . Multiple allergies   . VSD (ventricular septal defect and aortic arch hypoplasia     Past Surgical History:  Procedure Laterality Date  . BOTOX INJECTION N/A 10/23/2016   Procedure: BOTOX INJECTION;  Surgeon: Royston Cowper, MD;  Location: [facility];  Service: Urology;  Laterality: N/A;  . BREAST BIOPSY Left 11/13/13   benign  . CESAREAN SECTION    . COLONOSCOPY    . CORONARY ANGIOPLASTY    . CYSTOSCOPY N/A 10/23/2016   Procedure: CYSTOSCOPY;  Surgeon: Royston Cowper, MD;  Location: [facility];  Service: Urology;  Laterality: N/A;  . CYSTOSCOPY WITH HYDRODISTENSION AND BIOPSY    . LAPAROSCOPIC ASSISTED VAGINAL HYSTERECTOMY N/A 08/20/2016   Procedure: LAPAROSCOPIC ASSISTED VAGINAL HYSTERECTOMY;  Surgeon: Boykin Nearing, MD;  Location: [facility];  Service: Gynecology;  Laterality: N/A;  . LAPAROSCOPIC BILATERAL SALPINGECTOMY Bilateral 08/20/2016   Procedure: LAPAROSCOPIC BILATERAL SALPINGECTOMY;  Surgeon: Boykin Nearing, MD;  Location: [facility];  Service: Gynecology;  Laterality: Bilateral;  . NASAL SINUS SURGERY     x12    Family Psychiatric History: Reviewed family psychiatric history from my  progress note on 03/03/2018  Family History:  Family History  Problem Relation Age of Onset  . Breast cancer Maternal Aunt   . Breast cancer Paternal Aunt   . Breast cancer Maternal Grandmother   . Breast cancer Paternal Grandmother   . Breast cancer Paternal Aunt   . Bipolar disorder Paternal Aunt   . Kidney cancer Maternal Uncle   . Prostate cancer Paternal Grandfather   . Kidney disease Unknown        father's side  . Aneurysm Mother   . Liver disease Mother   . Drug abuse Mother   . Alcohol abuse Mother   . Anxiety disorder Mother   . Depression Mother   . Cancer Father   . Anxiety disorder Father   . Depression Father     Social History: Reviewed social history from my progress note on 03/03/2018. Social History   Socioeconomic History  . Marital status: Married    Spouse name: greg   . Number of children: 2  . Years of education: Not on file  . Highest education level: Associate degree: occupational, Hotel manager, or vocational program  Occupational History  . Not on file  Social Needs  . Financial resource strain: Not hard at all  . Food insecurity:    Worry: Never true    Inability: Never true  . Transportation needs:    Medical: No    Non-medical: No  Tobacco Use  . Smoking status: Former Smoker    Types: Cigarettes    Last attempt to quit: 11/12/2006    Years since quitting: 12.0  . Smokeless tobacco: Never Used  . Tobacco comment: quit 2008  Substance and Sexual Activity  . Alcohol use: No    Alcohol/week: 0.0 standard drinks  . Drug use: No  . Sexual activity: Yes    Birth control/protection: None  Lifestyle  . Physical activity:    Days per week: 0 days    Minutes per session: 0 min  . Stress: Very much  Relationships  . Social connections:    Talks on phone: Not on file    Gets together: Not on file    Attends religious service: More than 4 times per year    Active member of club or organization: No    Attends meetings of clubs or  organizations: Never    Relationship status: Married  Other Topics Concern  . Not on file  Social History Narrative  . Not on file    Allergies:  Allergies  Allergen Reactions  . Bee Pollen Anaphylaxis  . Bee Venom Anaphylaxis  . Eggs Or Egg-Derived Products Anaphylaxis  . Keflex [Cephalexin] Anaphylaxis  . Lac Bovis Anaphylaxis  . Lactase Anaphylaxis and Other (See Comments)  . Milk-Related Compounds Anaphylaxis  . Penicillins Anaphylaxis    Has patient had a PCN reaction causing immediate rash, facial/tongue/throat swelling, SOB or lightheadedness with hypotension: No Has patient had a PCN reaction causing severe rash involving mucus membranes or skin necrosis: No Has patient had a PCN reaction that required hospitalization No Has patient had a PCN reaction occurring within the last 10 years: Yes If all  of the above answers are "NO", then may proceed with Cephalosporin use.   . Shellfish Allergy Anaphylaxis and Other (See Comments)  . Soy Allergy Anaphylaxis  . Wheat Bran Anaphylaxis  . Codeine     Other reaction(s): RASH  . Ferrous Gluconate Diarrhea    And vomiting And vomiting  . Hydrocodone Hives  . Iron Diarrhea  . Iodine Rash  . Latex Itching and Rash    Metabolic Disorder Labs: No results found for: HGBA1C, MPG No results found for: PROLACTIN No results found for: CHOL, TRIG, HDL, CHOLHDL, VLDL, LDLCALC Lab Results  Component Value Date   TSH 5.752 (H) 09/19/2017   TSH 1.190 09/04/2016    Therapeutic Level Labs: No results found for: LITHIUM No results found for: VALPROATE No components found for:  CBMZ  Current Medications: Current Outpatient Medications  Medication Sig Dispense Refill  . albuterol (PROVENTIL HFA;VENTOLIN HFA) 108 (90 Base) MCG/ACT inhaler Inhale 2 puffs into the lungs every 6 (six) hours as needed for wheezing or shortness of breath. 1 Inhaler 2  . ALPRAZolam (XANAX) 0.5 MG tablet Take 1 tablet (0.5 mg total) by mouth as directed.  Take it only for severe panic symptoms 10 tablet 0  . azelastine (OPTIVAR) 0.05 % ophthalmic solution Place 1 drop into both eyes daily.    . Azelastine HCl 0.15 % SOLN Place 2 sprays into the nose 2 (two) times daily.     . Biotin 10000 MCG TABS Take 10,000 mg by mouth daily.    . busPIRone (BUSPAR) 10 MG tablet Take 0.5 tablets (5 mg total) by mouth daily. 270 tablet 0  . cetirizine (ZYRTEC) 10 MG tablet Take 2 tablets by mouth 2 (two) times daily.     . Cholecalciferol (VITAMIN D-1000 MAX ST) 1000 units tablet Take 1 tablet by mouth 2 (two) times daily.     Marland Kitchen CRANBERRY PO Take 1 capsule by mouth at bedtime.     Marland Kitchen desvenlafaxine (PRISTIQ) 100 MG 24 hr tablet Take 1 tablet (100 mg total) by mouth daily. 90 tablet 1  . doxycycline (MONODOX) 100 MG capsule Take 100 mg by mouth 2 (two) times daily.  0  . EPINEPHrine 0.3 mg/0.3 mL IJ SOAJ injection Inject 0.3 mLs (0.3 mg total) into the muscle once. 1 Device 1  . erythromycin ophthalmic ointment Use 1/4" ribbon to affected eye & sutures if present 2x/day for 14 days. If you develop allergy symptoms, stop & call our office.    . fluticasone (FLONASE) 50 MCG/ACT nasal spray Place 1 spray into both nostrils 2 (two) times daily.     . fluticasone (FLOVENT HFA) 220 MCG/ACT inhaler INHALE 2 PUFFS BY MOUTH TWICE DAILY    . Fluticasone-Salmeterol (ADVAIR) 250-50 MCG/DOSE AEPB Inhale 1 puff into the lungs 2 (two) times daily.    . Galcanezumab-gnlm 120 MG/ML SOAJ Inject into the skin.    . hydrochlorothiazide (HYDRODIURIL) 25 MG tablet Take 25 mg by mouth daily.    . hydrochlorothiazide (HYDRODIURIL) 25 MG tablet TAKE 1 TABLET BY MOUTH ONCE DAILY    . hydrOXYzine (ATARAX/VISTARIL) 50 MG tablet hydroxyzine HCl 50 mg tablet    . Insulin Pen Needle (FIFTY50 PEN NEEDLES) 32G X 4 MM MISC by Does not apply route.    Marland Kitchen levocetirizine (XYZAL) 5 MG tablet Take by mouth.    . levothyroxine (SYNTHROID, LEVOTHROID) 50 MCG tablet Take 50 mcg daily, any generic  manufacturer is okay but try to stay with same manufacturer as much as  possible    . Liraglutide -Weight Management (SAXENDA) 18 MG/3ML SOPN Inject into the skin.    . magnesium oxide (MAG-OX) 400 MG tablet Take 400 mg by mouth daily.    . metFORMIN (GLUCOPHAGE) 500 MG tablet Take by mouth.    . metoCLOPramide (REGLAN) 5 MG tablet TAKE 1 TABLET BY MOUTH EVERY 6 HOURS AS NEEDED FOR MIGRAINE HEADACHE  1  . montelukast (SINGULAIR) 10 MG tablet Take 10 mg by mouth at bedtime.     . Multiple Vitamins-Minerals (ZINC PO) Take 1 tablet by mouth daily.    . nitrofurantoin, macrocrystal-monohydrate, (MACROBID) 100 MG capsule Take by mouth.    Marland Kitchen ofloxacin (FLOXIN) 0.3 % OTIC solution ofloxacin 0.3 % ear drops    . omalizumab (XOLAIR) 150 MG injection INJECT 300MG  SUBCUTANEOUSLY EVERY 4 WEEKS (GIVEN AT PRESCRIBERS OFFICE)    . potassium chloride (K-DUR) 10 MEQ tablet Take by mouth.    . ranitidine (ZANTAC) 150 MG tablet Take 150 mg by mouth as directed. Takes 1 in the morning 1 before each meal and 1 before bedtime    . temazepam (RESTORIL) 7.5 MG capsule Take 1 capsule (7.5 mg total) by mouth at bedtime as needed for sleep. 30 capsule 0  . vitamin B-12 (CYANOCOBALAMIN) 1000 MCG tablet Take 1,000 mcg by mouth daily.    . vitamin C (ASCORBIC ACID) 500 MG tablet Take 500 mg by mouth at bedtime.    Marland Kitchen azelastine (ASTELIN) 0.1 % nasal spray     . methylPREDNISolone (MEDROL DOSEPAK) 4 MG TBPK tablet     . SAXENDA 18 MG/3ML SOPN      No current facility-administered medications for this visit.      Musculoskeletal: Strength & Muscle Tone: within normal limits Gait & Station: normal Patient leans: N/A  Psychiatric Specialty Exam: Review of Systems  Psychiatric/Behavioral: The patient is nervous/anxious.   All other systems reviewed and are negative.   Blood pressure 118/71, pulse 83, temperature 97.7 F (36.5 C), temperature source Oral, weight 178 lb 6.4 oz (80.9 kg), last menstrual period  07/17/2016.Body mass index is 32.63 kg/m.  General Appearance: Casual  Eye Contact:  Fair  Speech:  Clear and Coherent  Volume:  Normal  Mood:  Anxious  Affect:  Appropriate  Thought Process:  Goal Directed and Descriptions of Associations: Intact  Orientation:  Full (Time, Place, and Person)  Thought Content: Logical   Suicidal Thoughts:  No  Homicidal Thoughts:  No  Memory:  Immediate;   Fair Recent;   Fair Remote;   Fair  Judgement:  Fair  Insight:  Fair  Psychomotor Activity:  Normal  Concentration:  Concentration: Fair and Attention Span: Fair  Recall:  [employer] of Knowledge: Fair  Language: Fair  Akathisia:  No  Handed:  Right  AIMS (if indicated): Denies tremors, rigidity  Assets:  Communication Skills Desire for Improvement Social Support  ADL's:  Intact  Cognition: WNL  Sleep:  Fair   Screenings:   Assessment and Plan: Natara is a 48 year old female, has a history of anxiety, multiple allergies like urticaria, recent diagnosis of basal cell carcinoma, history of elevated TSH, presented to clinic today for a follow-up visit.  Patient is currently making progress on the current medication regimen.  Plan as noted below.  Plan GAD-improving Pristiq 100 mg p.o. daily Continue psychotherapy BuSpar 5 mg p.o. daily-reduced dosage.  Insomnia-improving Temazepam 7.5 mg p.o. nightly as needed Pt had sleep study done on 09/16/2018 and was advised to lose  weight and she does not have OSA. However if she continues to have problems and possible concerns of sleep apnea then in lab routine overnight polysomnogram in the supine position following a night of sleep restriction may be done-per sleep consult.  For needle phobia- unstable Patient will continue psychotherapy sessions.  Follow-up in clinic in 2 months or sooner if needed.  I have spent atleast 15 minutes face to face with patient today. More than 50 % of the time was spent for psychoeducation and supportive  psychotherapy and care coordination.  This note was generated in part or whole with voice recognition software. Voice recognition is usually quite accurate but there are transcription errors that can and very often do occur. I apologize for any typographical errors that were not detected and corrected.        Ursula Alert, MD 12/12/2018, 12:37 PM

## 2018-12-12 NOTE — Patient Instructions (Signed)

## 2018-12-30 ENCOUNTER — Ambulatory Visit: Payer: 59 | Admitting: Licensed Clinical Social Worker

## 2019-01-27 ENCOUNTER — Other Ambulatory Visit: Payer: Self-pay | Admitting: Neurology

## 2019-01-27 DIAGNOSIS — G43119 Migraine with aura, intractable, without status migrainosus: Secondary | ICD-10-CM

## 2019-02-03 ENCOUNTER — Telehealth: Payer: Self-pay

## 2019-02-03 NOTE — Telephone Encounter (Signed)
Received a fax requesting a refill on the desvenlafax succ    desvenlafaxine (PRISTIQ) 100 MG 24 hr tablet  Medication  Date: 08/18/2018 Department: [facility] Ordering/Authorizing: Ursula Alert, MD  Order Providers   Prescribing Provider Encounter Provider  Ursula Alert, MD Ursula Alert, MD  Outpatient Medication Detail    Disp Refills Start End   desvenlafaxine (PRISTIQ) 100 MG 24 hr tablet 90 tablet 1 08/18/2018    Sig - Route: Take 1 tablet (100 mg total) by mouth daily. - Oral   Sent to pharmacy as: desvenlafaxine (PRISTIQ) 100 MG 24 hr tablet   E-Prescribing Status: Receipt confirmed by pharmacy (08/18/2018 2:32 PM EDT)

## 2019-02-04 ENCOUNTER — Ambulatory Visit: Admission: RE | Admit: 2019-02-04 | Payer: 59 | Source: Ambulatory Visit

## 2019-02-04 MED ORDER — DESVENLAFAXINE SUCCINATE ER 100 MG PO TB24: 100.0000 mg | ORAL_TABLET | Freq: Every day | ORAL | 1 refills | Status: DC

## 2019-02-04 NOTE — Telephone Encounter (Signed)
Sent Pristiq to pharmacy

## 2019-02-09 ENCOUNTER — Other Ambulatory Visit: Payer: Self-pay

## 2019-02-09 ENCOUNTER — Encounter: Payer: Self-pay | Admitting: Psychiatry

## 2019-02-09 ENCOUNTER — Ambulatory Visit (INDEPENDENT_AMBULATORY_CARE_PROVIDER_SITE_OTHER): Payer: 59 | Admitting: Psychiatry

## 2019-02-09 DIAGNOSIS — G4701 Insomnia due to medical condition: Secondary | ICD-10-CM

## 2019-02-09 DIAGNOSIS — F411 Generalized anxiety disorder: Secondary | ICD-10-CM

## 2019-02-09 DIAGNOSIS — F40298 Other specified phobia: Secondary | ICD-10-CM

## 2019-02-09 MED ORDER — TEMAZEPAM 7.5 MG PO CAPS: 7.5000 mg | ORAL_CAPSULE | Freq: Every evening | ORAL | 0 refills | Status: DC | PRN

## 2019-02-09 NOTE — Progress Notes (Signed)
Virtual Visit via Telephone Note  I connected with Kristine [name] on 02/09/19 at  8:30 AM EDT by telephone and verified that I am speaking with the correct person using two identifiers.   I discussed the limitations, risks, security and privacy concerns of performing an evaluation and management service by telephone and the availability of in person appointments. I also discussed with the patient that there may be a patient responsible charge related to this service. The patient expressed understanding and agreed to proceed. I discussed the assessment and treatment plan with the patient. The patient was provided an opportunity to ask questions and all were answered. The patient agreed with the plan and demonstrated an understanding of the instructions.   The patient was advised to call back or seek an in-person evaluation if the symptoms worsen or if the condition fails to improve as anticipated.  I provided 15 minutes of non-face-to-face time during this encounter.   Ursula Alert, MD   OP Progress Note  02/09/2019 8:41 AM Kristine [name]  MRN:  [id number]  Chief Complaint: Follow up Chief Complaint    Follow-up     HPI: Kristine [name] is a 48 yr old female , married , employed, lives in [place] , has a history of multiple medical problems like GAD, insomnia , needle phobia , VSD, TSH abnormalities , GERD , BCC, was evaluated by phone today.  Patient today reports she is anxious about Covid 107 outbreak. 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 Patient reports she is trying to cope as much as possible. Pristiq does help. She however reports she has been sleeping more during the day. She reports she continues to have trouble falling asleep at night. Temazepam does help her to stay asleep , but not fall asleep.She has not been taking it at night, tried it only once.  Discusses sleep hygiene tips. Discussed taking Temazepam earlier so that it gives her enough time to kick  in.  She has upcoming appointment with neurologist and is on VItamin B 12 shots .  Patient denies suicidality, homicidality and perceptual disturbances.      Visit Diagnosis:    ICD-10-CM   1. GAD (generalized anxiety disorder) F41.1   2. Insomnia due to medical condition G47.01 temazepam (RESTORIL) 7.5 MG capsule  3. Needle phobia F40.298     Past Psychiatric History: I have reviewed past psychiatric history from my progress note 12/12/2018  Past Medical History:  Past Medical History:  Diagnosis Date  . Abnormal weight gain 08/05/2013  . Absence of interventricular septum 08/05/2013  . Allergic rhinitis 04/17/2016  . Allergic urticaria due to ingested food 02/09/2016  . Anxiety   . Asthma   . Clinical depression 09/30/2013  . Complication of anesthesia    PT HAD TROUBLE BREATHING AFTER HYSTERECTOMY IN OCTOBER  . Depression   . Dizziness   . Excess weight 09/30/2013  . GERD (gastroesophageal reflux disease)   . Headache, migraine 09/30/2013  . Heart murmur   . Heavy drinker 04/11/2014   Overview:  Patient reports drinking 2-3 bottles of wine each weekend.    . History of methicillin resistant staphylococcus aureus (MRSA) 11/2015  . Infection of urinary tract 09/30/2013  . Irregular bleeding 09/30/2013  . Multiple allergies   . VSD (ventricular septal defect and aortic arch hypoplasia     Past Surgical History:  Procedure Laterality Date  . BOTOX INJECTION N/A 10/23/2016   Procedure: BOTOX INJECTION;  Surgeon: Royston Cowper, MD;  Location: [facility];  Service: Urology;  Laterality: N/A;  . BREAST BIOPSY Left 11/13/13   benign  . CESAREAN SECTION    . COLONOSCOPY    . CORONARY ANGIOPLASTY    . CYSTOSCOPY N/A 10/23/2016   Procedure: CYSTOSCOPY;  Surgeon: Royston Cowper, MD;  Location: [facility];  Service: Urology;  Laterality: N/A;  . CYSTOSCOPY WITH HYDRODISTENSION AND BIOPSY    . LAPAROSCOPIC ASSISTED VAGINAL HYSTERECTOMY N/A 08/20/2016   Procedure: LAPAROSCOPIC  ASSISTED VAGINAL HYSTERECTOMY;  Surgeon: Boykin Nearing, MD;  Location: [facility];  Service: Gynecology;  Laterality: N/A;  . LAPAROSCOPIC BILATERAL SALPINGECTOMY Bilateral 08/20/2016   Procedure: LAPAROSCOPIC BILATERAL SALPINGECTOMY;  Surgeon: Boykin Nearing, MD;  Location: [facility];  Service: Gynecology;  Laterality: Bilateral;  . NASAL SINUS SURGERY     x12    Family Psychiatric History: I have reviewed family history from my progress notes on 12/12/2018  Family History:  Family History  Problem Relation Age of Onset  . Breast cancer Maternal Aunt   . Breast cancer Paternal Aunt   . Breast cancer Maternal Grandmother   . Breast cancer Paternal Grandmother   . Breast cancer Paternal Aunt   . Bipolar disorder Paternal Aunt   . Kidney cancer Maternal Uncle   . Prostate cancer Paternal Grandfather   . Kidney disease Unknown        father's side  . Aneurysm Mother   . Liver disease Mother   . Drug abuse Mother   . Alcohol abuse Mother   . Anxiety disorder Mother   . Depression Mother   . Cancer Father   . Anxiety disorder Father   . Depression Father     Social History: I have reviewed social history on my progress note on 12/12/2018 Social History   Socioeconomic History  . Marital status: Married    Spouse name: greg   . Number of children: 2  . Years of education: Not on file  . Highest education level: Associate degree: occupational, Hotel manager, or vocational program  Occupational History  . Not on file  Social Needs  . Financial resource strain: Not hard at all  . Food insecurity:    Worry: Never true    Inability: Never true  . Transportation needs:    Medical: No    Non-medical: No  Tobacco Use  . Smoking status: Former Smoker    Types: Cigarettes    Last attempt to quit: 11/12/2006    Years since quitting: 12.2  . Smokeless tobacco: Never Used  . Tobacco comment: quit 2008  Substance and Sexual Activity  . Alcohol use: No    Alcohol/week: 0.0  standard drinks  . Drug use: No  . Sexual activity: Yes    Birth control/protection: None  Lifestyle  . Physical activity:    Days per week: 0 days    Minutes per session: 0 min  . Stress: Very much  Relationships  . Social connections:    Talks on phone: Not on file    Gets together: Not on file    Attends religious service: More than 4 times per year    Active member of club or organization: No    Attends meetings of clubs or organizations: Never    Relationship status: Married  Other Topics Concern  . Not on file  Social History Narrative  . Not on file    Allergies:  Allergies  Allergen Reactions  . Bee Pollen Anaphylaxis  . Bee Venom Anaphylaxis  .  Eggs Or Egg-Derived Products Anaphylaxis  . Keflex [Cephalexin] Anaphylaxis  . Lac Bovis Anaphylaxis  . Lactase Anaphylaxis and Other (See Comments)  . Milk-Related Compounds Anaphylaxis  . Penicillins Anaphylaxis    Has patient had a PCN reaction causing immediate rash, facial/tongue/throat swelling, SOB or lightheadedness with hypotension: No Has patient had a PCN reaction causing severe rash involving mucus membranes or skin necrosis: No Has patient had a PCN reaction that required hospitalization No Has patient had a PCN reaction occurring within the last 10 years: Yes If all of the above answers are "NO", then may proceed with Cephalosporin use.   . Shellfish Allergy Anaphylaxis and Other (See Comments)  . Soy Allergy Anaphylaxis  . Wheat Bran Anaphylaxis  . Codeine     Other reaction(s): RASH  . Ferrous Gluconate Diarrhea    And vomiting And vomiting  . Hydrocodone Hives  . Iron Diarrhea  . Iodine Rash  . Latex Itching and Rash    Metabolic Disorder Labs: No results found for: HGBA1C, MPG No results found for: PROLACTIN No results found for: CHOL, TRIG, HDL, CHOLHDL, VLDL, LDLCALC Lab Results  Component Value Date   TSH 5.752 (H) 09/19/2017   TSH 1.190 09/04/2016    Therapeutic Level Labs: No  results found for: LITHIUM No results found for: VALPROATE No components found for:  CBMZ  Current Medications: Current Outpatient Medications  Medication Sig Dispense Refill  . candesartan (ATACAND) 8 MG tablet Take by mouth.    Marland Kitchen albuterol (PROVENTIL HFA;VENTOLIN HFA) 108 (90 Base) MCG/ACT inhaler Inhale 2 puffs into the lungs every 6 (six) hours as needed for wheezing or shortness of breath. 1 Inhaler 2  . ALPRAZolam (XANAX) 0.5 MG tablet Take 1 tablet (0.5 mg total) by mouth as directed. Take it only for severe panic symptoms 10 tablet 0  . azelastine (ASTELIN) 0.1 % nasal spray     . azelastine (OPTIVAR) 0.05 % ophthalmic solution Place 1 drop into both eyes daily.    . Azelastine HCl 0.15 % SOLN Place 2 sprays into the nose 2 (two) times daily.     . Biotin 10000 MCG TABS Take 10,000 mg by mouth daily.    . busPIRone (BUSPAR) 10 MG tablet Take 0.5 tablets (5 mg total) by mouth daily. 270 tablet 0  . cetirizine (ZYRTEC) 10 MG tablet Take 2 tablets by mouth 2 (two) times daily.     . Cholecalciferol (VITAMIN D-1000 MAX ST) 1000 units tablet Take 1 tablet by mouth 2 (two) times daily.     Marland Kitchen CRANBERRY PO Take 1 capsule by mouth at bedtime.     . cyanocobalamin (,VITAMIN B-12,) 1000 MCG/ML injection INJECT ONE ML INTO THE MUSCLE ONCE WEEKLY FOR FOUR WEEKS. THEN INJECT 1ML INTO THE MUSCLE ONCE MONTHY FOR FOUR MONTHS    . desvenlafaxine (PRISTIQ) 100 MG 24 hr tablet Take 1 tablet (100 mg total) by mouth daily. 90 tablet 1  . doxycycline (MONODOX) 100 MG capsule Take 100 mg by mouth 2 (two) times daily.  0  . EPINEPHrine 0.3 mg/0.3 mL IJ SOAJ injection Inject 0.3 mLs (0.3 mg total) into the muscle once. 1 Device 1  . erythromycin ophthalmic ointment Use 1/4" ribbon to affected eye & sutures if present 2x/day for 14 days. If you develop allergy symptoms, stop & call our office.    . fluticasone (FLONASE) 50 MCG/ACT nasal spray Place 1 spray into both nostrils 2 (two) times daily.     .  fluticasone (FLOVENT  HFA) 220 MCG/ACT inhaler INHALE 2 PUFFS BY MOUTH TWICE DAILY    . Fluticasone-Salmeterol (ADVAIR) 250-50 MCG/DOSE AEPB Inhale 1 puff into the lungs 2 (two) times daily.    . Galcanezumab-gnlm 120 MG/ML SOAJ Inject into the skin.    . hydrochlorothiazide (HYDRODIURIL) 25 MG tablet Take 25 mg by mouth daily.    . hydrOXYzine (ATARAX/VISTARIL) 50 MG tablet hydroxyzine HCl 50 mg tablet    . Insulin Pen Needle (FIFTY50 PEN NEEDLES) 32G X 4 MM MISC by Does not apply route.    Marland Kitchen levothyroxine (SYNTHROID, LEVOTHROID) 50 MCG tablet Take 50 mcg daily, any generic manufacturer is okay but try to stay with same manufacturer as much as possible    . Liraglutide -Weight Management (SAXENDA) 18 MG/3ML SOPN Inject into the skin.    . magnesium oxide (MAG-OX) 400 MG tablet Take 400 mg by mouth daily.    . metFORMIN (GLUCOPHAGE) 500 MG tablet Take by mouth.    . methylPREDNISolone (MEDROL DOSEPAK) 4 MG TBPK tablet     . metoCLOPramide (REGLAN) 5 MG tablet TAKE 1 TABLET BY MOUTH EVERY 6 HOURS AS NEEDED FOR MIGRAINE HEADACHE  1  . montelukast (SINGULAIR) 10 MG tablet Take 10 mg by mouth at bedtime.     . Multiple Vitamins-Minerals (ZINC PO) Take 1 tablet by mouth daily.    . nitrofurantoin, macrocrystal-monohydrate, (MACROBID) 100 MG capsule Take by mouth.    Marland Kitchen ofloxacin (FLOXIN) 0.3 % OTIC solution ofloxacin 0.3 % ear drops    . omalizumab (XOLAIR) 150 MG injection INJECT 300MG  SUBCUTANEOUSLY EVERY 4 WEEKS (GIVEN AT PRESCRIBERS OFFICE)    . ranitidine (ZANTAC) 150 MG tablet Take 150 mg by mouth as directed. Takes 1 in the morning 1 before each meal and 1 before bedtime    . RELION PEN NEEDLES 29G X 12MM MISC USE 1 PEN NEEDLE ONCE DAILY AS DIRECTED    . SAXENDA 18 MG/3ML SOPN     . temazepam (RESTORIL) 7.5 MG capsule Take 1 capsule (7.5 mg total) by mouth at bedtime as needed for sleep. 30 capsule 0  . vitamin B-12 (CYANOCOBALAMIN) 1000 MCG tablet Take 1,000 mcg by mouth daily.    . vitamin C  (ASCORBIC ACID) 500 MG tablet Take 500 mg by mouth at bedtime.     No current facility-administered medications for this visit.      Musculoskeletal: Strength & Muscle Tone: unable to assess Gait & Station: unable to assess Patient leans: N/A  Psychiatric Specialty Exam: Review of Systems  Psychiatric/Behavioral: The patient is nervous/anxious and has insomnia.   All other systems reviewed and are negative.   Last menstrual period 07/17/2016.There is no height or weight on file to calculate BMI.  General Appearance: unable to assess  Eye Contact:  unable to assess  Speech:  Normal Rate  Volume:  Normal  Mood:  Anxious  Affect:  unable to assess  Thought Process:  Goal Directed and Descriptions of Associations: Intact  Orientation:  Full (Time, Place, and Person)  Thought Content: Logical   Suicidal Thoughts:  No  Homicidal Thoughts:  No  Memory:  Immediate;   Fair Recent;   Fair Remote;   Fair  Judgement:  Fair  Insight:  Fair  Psychomotor Activity:  unable to assess  Concentration:  Concentration: Fair and Attention Span: Fair  Recall:  [employer] of Knowledge: Fair  Language: Fair  Akathisia:  No  Handed:  Right  AIMS (if indicated):   Assets:  Communication  Skills Desire for Improvement Social Support  ADL's:  Intact  Cognition: WNL  Sleep:  restless   Screenings:   Assessment and Plan: Kristine [name] is a 48 yr old female , has a history of anxiety , multiple medical problems as noted above, was evaluated by phone today. Patient continues to struggle with sleep, although making progress with regards to her anxiety.Discussed plan as noted below.  Plan  GAD- improving Continue Pristiq 100 mg PO daily Continue CBT. Buspar 5 mg Po daily - reduced dosage.  Insomnia- Unstable Temazepam 7.5 mg po qhs - take it 1 hr prior to bed time. Discussed sleep hygiene. She had sleep study done - does not have OSA.  For needle Phobia - unstable- chronic Continue  CBT  Follow up in clinic in 2- 3 weeks or sooner.  I have spent atleast 15 minutes non face to face with patient today. More than 50 % of the time was spent for psychoeducation and supportive psychotherapy and care coordination.      Ursula Alert, MD 02/10/2019, 8:35 AM

## 2019-02-10 ENCOUNTER — Encounter: Payer: Self-pay | Admitting: Psychiatry

## 2019-02-10 ENCOUNTER — Ambulatory Visit: Payer: 59 | Admitting: Physical Therapy

## 2019-02-17 ENCOUNTER — Encounter: Payer: 59 | Admitting: Physical Therapy

## 2019-02-19 ENCOUNTER — Encounter: Payer: 59 | Admitting: Physical Therapy

## 2019-02-24 ENCOUNTER — Encounter: Payer: 59 | Admitting: Physical Therapy

## 2019-02-26 ENCOUNTER — Encounter: Payer: 59 | Admitting: Physical Therapy

## 2019-03-04 ENCOUNTER — Encounter: Payer: 59 | Admitting: Physical Therapy

## 2019-03-12 ENCOUNTER — Encounter: Payer: 59 | Admitting: Physical Therapy

## 2019-03-14 DIAGNOSIS — D049 Carcinoma in situ of skin, unspecified: Secondary | ICD-10-CM | POA: Insufficient documentation

## 2019-03-23 ENCOUNTER — Other Ambulatory Visit: Payer: Self-pay

## 2019-03-23 ENCOUNTER — Encounter: Payer: Self-pay | Admitting: Psychiatry

## 2019-03-23 ENCOUNTER — Ambulatory Visit (INDEPENDENT_AMBULATORY_CARE_PROVIDER_SITE_OTHER): Payer: 59 | Admitting: Psychiatry

## 2019-03-23 DIAGNOSIS — G4701 Insomnia due to medical condition: Secondary | ICD-10-CM

## 2019-03-23 DIAGNOSIS — F40298 Other specified phobia: Secondary | ICD-10-CM | POA: Diagnosis not present

## 2019-03-23 DIAGNOSIS — F411 Generalized anxiety disorder: Secondary | ICD-10-CM | POA: Diagnosis not present

## 2019-03-23 MED ORDER — BUSPIRONE HCL 10 MG PO TABS: 5.0000 mg | ORAL_TABLET | Freq: Every day | ORAL | 0 refills | Status: DC

## 2019-03-23 MED ORDER — TEMAZEPAM 7.5 MG PO CAPS: 7.5000 mg | ORAL_CAPSULE | Freq: Every evening | ORAL | 2 refills | Status: DC | PRN

## 2019-03-23 NOTE — Progress Notes (Signed)
Virtual Visit via Video Note  I connected with Kristine [name] on 03/23/19 at  8:30 AM EDT by a video enabled telemedicine application and verified that I am speaking with the correct person using two identifiers.   I discussed the limitations of evaluation and management by telemedicine and the availability of in person appointments. The patient expressed understanding and agreed to proceed.    I discussed the assessment and treatment plan with the patient. The patient was provided an opportunity to ask questions and all were answered. The patient agreed with the plan and demonstrated an understanding of the instructions.   The patient was advised to call back or seek an in-person evaluation if the symptoms worsen or if the condition fails to improve as anticipated.   [facility] MD OP Progress Note  03/23/2019 12:10 PM Kristine [name]  MRN:  [id number]  Chief Complaint:  Chief Complaint    Follow-up     HPI: Kristine [name] is a 48 year old female, married, employed, lives in [place], has a history of GAD, insomnia, needle phobia, VSD, TSH abnormalities, GERD, BCC was evaluated by telemedicine today.  Patient today reports she is currently doing well on the current medication regimen.  She reports she is compliant on Pristiq.  She also takes a low dose of BuSpar-5 mg.  She has tried going up on the BuSpar however she has side effects to it when she tries to go up.  She hence has been taking only 5 mg which is helpful.  Patient reports she takes temazepam as needed for sleep.  She does not want to take anything that is addictive every day.  She however reports whenever she has restless sleep she has been taking it.  She denies any side effects to it.  She reports she has not gone back to work since the COVID-19 restrictions are still going on.  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  Her husband however has been very supportive.  She denies any suicidality, homicidality or perceptual  disturbances Visit Diagnosis:    ICD-10-CM   1. GAD (generalized anxiety disorder) F41.1 busPIRone (BUSPAR) 10 MG tablet  2. Insomnia due to medical condition G47.01 temazepam (RESTORIL) 7.5 MG capsule    busPIRone (BUSPAR) 10 MG tablet  3. Needle phobia F40.298     Past Psychiatric History: I have reviewed past psychiatric history from my progress note on 12/12/2018.  Past Medical History:  Past Medical History:  Diagnosis Date  . Abnormal weight gain 08/05/2013  . Absence of interventricular septum 08/05/2013  . Allergic rhinitis 04/17/2016  . Allergic urticaria due to ingested food 02/09/2016  . Anxiety   . Asthma   . Clinical depression 09/30/2013  . Complication of anesthesia    PT HAD TROUBLE BREATHING AFTER HYSTERECTOMY IN OCTOBER  . Depression   . Dizziness   . Excess weight 09/30/2013  . GERD (gastroesophageal reflux disease)   . Headache, migraine 09/30/2013  . Heart murmur   . Heavy drinker 04/11/2014   Overview:  Patient reports drinking 2-3 bottles of wine each weekend.    . History of methicillin resistant staphylococcus aureus (MRSA) 11/2015  . Infection of urinary tract 09/30/2013  . Irregular bleeding 09/30/2013  . Multiple allergies   . VSD (ventricular septal defect and aortic arch hypoplasia     Past Surgical History:  Procedure Laterality Date  . BOTOX INJECTION N/A 10/23/2016   Procedure: BOTOX INJECTION;  Surgeon: Royston Cowper, MD;  Location: [facility];  Service: Urology;  Laterality: N/A;  . BREAST BIOPSY Left 11/13/13   benign  . CESAREAN SECTION    . COLONOSCOPY    . CORONARY ANGIOPLASTY    . CYSTOSCOPY N/A 10/23/2016   Procedure: CYSTOSCOPY;  Surgeon: Royston Cowper, MD;  Location: [facility];  Service: Urology;  Laterality: N/A;  . CYSTOSCOPY WITH HYDRODISTENSION AND BIOPSY    . LAPAROSCOPIC ASSISTED VAGINAL HYSTERECTOMY N/A 08/20/2016   Procedure: LAPAROSCOPIC ASSISTED VAGINAL HYSTERECTOMY;  Surgeon: Boykin Nearing, MD;  Location: [facility];   Service: Gynecology;  Laterality: N/A;  . LAPAROSCOPIC BILATERAL SALPINGECTOMY Bilateral 08/20/2016   Procedure: LAPAROSCOPIC BILATERAL SALPINGECTOMY;  Surgeon: Boykin Nearing, MD;  Location: [facility];  Service: Gynecology;  Laterality: Bilateral;  . NASAL SINUS SURGERY     x12    Family Psychiatric History: Reviewed family psychiatric history from my progress note on 12/12/2018.  Family History:  Family History  Problem Relation Age of Onset  . Breast cancer Maternal Aunt   . Breast cancer Paternal Aunt   . Breast cancer Maternal Grandmother   . Breast cancer Paternal Grandmother   . Breast cancer Paternal Aunt   . Bipolar disorder Paternal Aunt   . Kidney cancer Maternal Uncle   . Prostate cancer Paternal Grandfather   . Kidney disease Unknown        father's side  . Aneurysm Mother   . Liver disease Mother   . Drug abuse Mother   . Alcohol abuse Mother   . Anxiety disorder Mother   . Depression Mother   . Cancer Father   . Anxiety disorder Father   . Depression Father     Social History: Reviewed social history from my progress note on 12/12/2018. Social History   Socioeconomic History  . Marital status: Married    Spouse name: greg   . Number of children: 2  . Years of education: Not on file  . Highest education level: Associate degree: occupational, Hotel manager, or vocational program  Occupational History  . Not on file  Social Needs  . Financial resource strain: Not hard at all  . Food insecurity:    Worry: Never true    Inability: Never true  . Transportation needs:    Medical: No    Non-medical: No  Tobacco Use  . Smoking status: Former Smoker    Types: Cigarettes    Last attempt to quit: 11/12/2006    Years since quitting: 12.3  . Smokeless tobacco: Never Used  . Tobacco comment: quit 2008  Substance and Sexual Activity  . Alcohol use: No    Alcohol/week: 0.0 standard drinks  . Drug use: No  . Sexual activity: Yes    Birth control/protection:  None  Lifestyle  . Physical activity:    Days per week: 0 days    Minutes per session: 0 min  . Stress: Very much  Relationships  . Social connections:    Talks on phone: Not on file    Gets together: Not on file    Attends religious service: More than 4 times per year    Active member of club or organization: No    Attends meetings of clubs or organizations: Never    Relationship status: Married  Other Topics Concern  . Not on file  Social History Narrative  . Not on file    Allergies:  Allergies  Allergen Reactions  . Bee Pollen Anaphylaxis  . Bee Venom Anaphylaxis  . Eggs Or Egg-Derived Products Anaphylaxis  .  Keflex [Cephalexin] Anaphylaxis  . Lac Bovis Anaphylaxis  . Lactase Anaphylaxis and Other (See Comments)  . Milk-Related Compounds Anaphylaxis  . Penicillins Anaphylaxis    Has patient had a PCN reaction causing immediate rash, facial/tongue/throat swelling, SOB or lightheadedness with hypotension: No Has patient had a PCN reaction causing severe rash involving mucus membranes or skin necrosis: No Has patient had a PCN reaction that required hospitalization No Has patient had a PCN reaction occurring within the last 10 years: Yes If all of the above answers are "NO", then may proceed with Cephalosporin use.   . Shellfish Allergy Anaphylaxis and Other (See Comments)  . Soy Allergy Anaphylaxis  . Wheat Bran Anaphylaxis  . Codeine     Other reaction(s): RASH  . Ferrous Gluconate Diarrhea    And vomiting And vomiting  . Hydrocodone Hives  . Iron Diarrhea  . Iodine Rash  . Latex Itching and Rash    Metabolic Disorder Labs: No results found for: HGBA1C, MPG No results found for: PROLACTIN No results found for: CHOL, TRIG, HDL, CHOLHDL, VLDL, LDLCALC Lab Results  Component Value Date   TSH 5.752 (H) 09/19/2017   TSH 1.190 09/04/2016    Therapeutic Level Labs: No results found for: LITHIUM No results found for: VALPROATE No components found for:   CBMZ  Current Medications: Current Outpatient Medications  Medication Sig Dispense Refill  . albuterol (PROVENTIL HFA;VENTOLIN HFA) 108 (90 Base) MCG/ACT inhaler Inhale 2 puffs into the lungs every 6 (six) hours as needed for wheezing or shortness of breath. 1 Inhaler 2  . ALPRAZolam (XANAX) 0.5 MG tablet Take 1 tablet (0.5 mg total) by mouth as directed. Take it only for severe panic symptoms 10 tablet 0  . azelastine (ASTELIN) 0.1 % nasal spray     . azelastine (OPTIVAR) 0.05 % ophthalmic solution Place 1 drop into both eyes daily.    . Azelastine HCl 0.15 % SOLN Place 2 sprays into the nose 2 (two) times daily.     . Biotin 10000 MCG TABS Take 10,000 mg by mouth daily.    . busPIRone (BUSPAR) 10 MG tablet Take 0.5 tablets (5 mg total) by mouth daily. 45 tablet 0  . candesartan (ATACAND) 8 MG tablet Take by mouth.    . cetirizine (ZYRTEC) 10 MG tablet Take 2 tablets by mouth 2 (two) times daily.     . Cholecalciferol (VITAMIN D-1000 MAX ST) 1000 units tablet Take 1 tablet by mouth 2 (two) times daily.     Marland Kitchen CRANBERRY PO Take 1 capsule by mouth at bedtime.     . cyanocobalamin (,VITAMIN B-12,) 1000 MCG/ML injection INJECT ONE ML INTO THE MUSCLE ONCE WEEKLY FOR FOUR WEEKS. THEN INJECT 1ML INTO THE MUSCLE ONCE MONTHY FOR FOUR MONTHS    . desvenlafaxine (PRISTIQ) 100 MG 24 hr tablet Take 1 tablet (100 mg total) by mouth daily. 90 tablet 1  . doxycycline (MONODOX) 100 MG capsule Take 100 mg by mouth 2 (two) times daily.  0  . EPINEPHrine 0.3 mg/0.3 mL IJ SOAJ injection Inject 0.3 mLs (0.3 mg total) into the muscle once. 1 Device 1  . erythromycin ophthalmic ointment Use 1/4" ribbon to affected eye & sutures if present 2x/day for 14 days. If you develop allergy symptoms, stop & call our office.    . fluticasone (FLONASE) 50 MCG/ACT nasal spray Place 1 spray into both nostrils 2 (two) times daily.     . fluticasone (FLOVENT HFA) 220 MCG/ACT inhaler INHALE 2 PUFFS BY  MOUTH TWICE DAILY    .  Fluticasone-Salmeterol (ADVAIR) 250-50 MCG/DOSE AEPB Inhale 1 puff into the lungs 2 (two) times daily.    . Galcanezumab-gnlm 120 MG/ML SOAJ Inject into the skin.    . hydrochlorothiazide (HYDRODIURIL) 25 MG tablet Take 25 mg by mouth daily.    . hydrOXYzine (ATARAX/VISTARIL) 50 MG tablet hydroxyzine HCl 50 mg tablet    . Insulin Pen Needle (FIFTY50 PEN NEEDLES) 32G X 4 MM MISC by Does not apply route.    Marland Kitchen levothyroxine (SYNTHROID, LEVOTHROID) 50 MCG tablet Take 50 mcg daily, any generic manufacturer is okay but try to stay with same manufacturer as much as possible    . Liraglutide -Weight Management (SAXENDA) 18 MG/3ML SOPN Inject into the skin.    . magnesium oxide (MAG-OX) 400 MG tablet Take 400 mg by mouth daily.    . methylPREDNISolone (MEDROL DOSEPAK) 4 MG TBPK tablet     . metoCLOPramide (REGLAN) 5 MG tablet TAKE 1 TABLET BY MOUTH EVERY 6 HOURS AS NEEDED FOR MIGRAINE HEADACHE  1  . montelukast (SINGULAIR) 10 MG tablet Take 10 mg by mouth at bedtime.     . Multiple Vitamins-Minerals (ZINC PO) Take 1 tablet by mouth daily.    . nitrofurantoin, macrocrystal-monohydrate, (MACROBID) 100 MG capsule Take by mouth.    Marland Kitchen ofloxacin (FLOXIN) 0.3 % OTIC solution ofloxacin 0.3 % ear drops    . omalizumab (XOLAIR) 150 MG injection INJECT 300MG  SUBCUTANEOUSLY EVERY 4 WEEKS (GIVEN AT PRESCRIBERS OFFICE)    . ranitidine (ZANTAC) 150 MG tablet Take 150 mg by mouth as directed. Takes 1 in the morning 1 before each meal and 1 before bedtime    . RELION PEN NEEDLES 29G X 12MM MISC USE 1 PEN NEEDLE ONCE DAILY AS DIRECTED    . SAXENDA 18 MG/3ML SOPN     . temazepam (RESTORIL) 7.5 MG capsule Take 1 capsule (7.5 mg total) by mouth at bedtime as needed for sleep. 30 capsule 2  . vitamin B-12 (CYANOCOBALAMIN) 1000 MCG tablet Take 1,000 mcg by mouth daily.    . vitamin C (ASCORBIC ACID) 500 MG tablet Take 500 mg by mouth at bedtime.     No current facility-administered medications for this visit.       Musculoskeletal: Strength & Muscle Tone: within normal limits Gait & Station: normal Patient leans: N/A  Psychiatric Specialty Exam: Review of Systems  Psychiatric/Behavioral: The patient is nervous/anxious.   All other systems reviewed and are negative.   Last menstrual period 07/17/2016.There is no height or weight on file to calculate BMI.  General Appearance: Casual  Eye Contact:  Fair  Speech:  Clear and Coherent  Volume:  Normal  Mood:  Anxious  Affect:  Appropriate  Thought Process:  Goal Directed and Descriptions of Associations: Intact  Orientation:  Full (Time, Place, and Person)  Thought Content: Logical   Suicidal Thoughts:  No  Homicidal Thoughts:  No  Memory:  Immediate;   Fair Recent;   Fair Remote;   Fair  Judgement:  Fair  Insight:  Fair  Psychomotor Activity:  Normal  Concentration:  Concentration: Fair and Attention Span: Good  Recall:  [employer] of Knowledge: Fair  Language: Fair  Akathisia:  No  Handed:  Right  AIMS (if indicated): denies tremors, rigidity,stiffness  Assets:  Communication Skills Desire for Improvement Housing Intimacy Social Support Talents/Skills  ADL's:  Intact  Cognition: WNL  Sleep:  Fair   Screenings:   Assessment and Plan: Kristine [name]  is a 48 year old female who has a history of anxiety, multiple medical problems as noted above was evaluated by telemedicine today.  Patient is currently making progress on the current medication regimen.  Plan as noted below.  Plan GAD- stable Pristiq 100 mg p.o. daily Continue CBT BuSpar 5 mg p.o. daily-reduced dosage.  For insomnia- improving Temazepam 7.5 mg p.o. nightly as needed.  She only takes it as needed since she does not want to take anything that is habit-forming daily. Discussed sleep hygiene techniques.  Needle phobia-unstable-chronic Continue CBT.  Follow-up in clinic in 2 to 3 months or sooner if needed.  Appointment scheduled for August 10 at 10 AM.  I  have spent atleast 15 minutes non face to face with patient today. More than 50 % of the time was spent for psychoeducation and supportive psychotherapy and care coordination.  This note was generated in part or whole with voice recognition software. Voice recognition is usually quite accurate but there are transcription errors that can and very often do occur. I apologize for any typographical errors that were not detected and corrected.        Ursula Alert, MD 03/23/2019, 12:10 PM

## 2019-04-15 ENCOUNTER — Telehealth: Payer: Self-pay

## 2019-04-15 NOTE — Telephone Encounter (Signed)
pt called states she never got the genic test.  I confirmed her address and the address we had in the system pt states she had not been there in 3 years. so i transfered pt to front desk to get address updated. pt was then transfered back to me and i told her I would contact [facility] and have them send another test to the correct address.  Pt also stated that a dr. Amedeo Gory in the [place] area prescribed her naltrexone 50mg  take 1/2 bid. And she states she called them about it and they havent gotten back with her.  She asked if you knew anything about the medication.  She states that the medication is affecting her mood and she doesn't want to take it anymore but she not sure if that is something she can stop.

## 2019-04-15 NOTE — Telephone Encounter (Signed)
Ok hope she gets it this time. If its any medication prescribed by another provider she will need to discuss it with them . However , if she is having serious side effects she can just stop it and follow up with her other provider. Please let her know. thanks

## 2019-05-18 ENCOUNTER — Other Ambulatory Visit: Payer: Self-pay

## 2019-05-18 ENCOUNTER — Ambulatory Visit: Payer: 59 | Attending: Neurology

## 2019-05-18 DIAGNOSIS — R2689 Other abnormalities of gait and mobility: Secondary | ICD-10-CM

## 2019-05-18 DIAGNOSIS — R2681 Unsteadiness on feet: Secondary | ICD-10-CM | POA: Insufficient documentation

## 2019-05-18 NOTE — Patient Instructions (Signed)
Treat: Access Code: [email or web address]  URL: [email or web address]  Date: 05/18/2019  Prepared by: Janna Arch   Exercises Seated Hamstring Stretch - 2 reps - 2 sets - 30 hold - 1x daily - 7x weekly Seated Ankle Alphabet - 10 reps - 2 sets - 2 hold - 1x daily - 7x weekly Supine modified nerve glide with hamstring stretch 10x df/pf

## 2019-05-18 NOTE — Therapy (Signed)
[place] MAIN [facility] SERVICES [address] [place], [place], [address] Phone: [phone]   Fax:  [phone]  Physical Therapy Evaluation  Patient Details  Name: Kristine [name] MRN: [id number] Date of Birth: [birth year] Referring Provider (PT): hemang shah    Encounter Date: 05/18/2019  PT End of Session - 05/18/19 1649    Visit Number  1    Number of Visits  16    Date for PT Re-Evaluation  07/13/19    Authorization Type  1/10 eval start 05/18/19    PT Start Time  1515    PT Stop Time  1600    PT Time Calculation (min)  45 min    Equipment Utilized During Treatment  Gait belt    Activity Tolerance  Patient tolerated treatment well    Behavior During Therapy  WFL for tasks assessed/performed       Past Medical History:  Diagnosis Date  . Abnormal weight gain 08/05/2013  . Absence of interventricular septum 08/05/2013  . Allergic rhinitis 04/17/2016  . Allergic urticaria due to ingested food 02/09/2016  . Anxiety   . Asthma   . Clinical depression 09/30/2013  . Complication of anesthesia    PT HAD TROUBLE BREATHING AFTER HYSTERECTOMY IN OCTOBER  . Depression   . Dizziness   . Excess weight 09/30/2013  . GERD (gastroesophageal reflux disease)   . Headache, migraine 09/30/2013  . Heart murmur   . Heavy drinker 04/11/2014   Overview:  Patient reports drinking 2-3 bottles of wine each weekend.    . History of methicillin resistant staphylococcus aureus (MRSA) 11/2015  . Infection of urinary tract 09/30/2013  . Irregular bleeding 09/30/2013  . Multiple allergies   . VSD (ventricular septal defect and aortic arch hypoplasia     Past Surgical History:  Procedure Laterality Date  . BOTOX INJECTION N/A 10/23/2016   Procedure: BOTOX INJECTION;  Surgeon: Royston Cowper, MD;  Location: [facility];  Service: Urology;  Laterality: N/A;  . BREAST BIOPSY Left 11/13/13   benign  . CESAREAN SECTION    . COLONOSCOPY    . CORONARY ANGIOPLASTY     . CYSTOSCOPY N/A 10/23/2016   Procedure: CYSTOSCOPY;  Surgeon: Royston Cowper, MD;  Location: [facility];  Service: Urology;  Laterality: N/A;  . CYSTOSCOPY WITH HYDRODISTENSION AND BIOPSY    . LAPAROSCOPIC ASSISTED VAGINAL HYSTERECTOMY N/A 08/20/2016   Procedure: LAPAROSCOPIC ASSISTED VAGINAL HYSTERECTOMY;  Surgeon: Boykin Nearing, MD;  Location: [facility];  Service: Gynecology;  Laterality: N/A;  . LAPAROSCOPIC BILATERAL SALPINGECTOMY Bilateral 08/20/2016   Procedure: LAPAROSCOPIC BILATERAL SALPINGECTOMY;  Surgeon: Boykin Nearing, MD;  Location: [facility];  Service: Gynecology;  Laterality: Bilateral;  . NASAL SINUS SURGERY     x12    There were no vitals filed for this visit.   Subjective Assessment - 05/18/19 1523    Subjective  Patient is a pleasant 48 year old female who presents for balance training/polyneuropathy.    Pertinent History  Patient is a pleasant 48 year old female who presents for balance training/polyneuropathy. PMH includes VSD(ventricular septal defect), allergic rhinitis, UTI, depression, migraines, polyneuropathy, bilateral carpal tunnel syndrome, ADD, anxiety, basal cell carcinoma, fibroids, GERD, and two children by C section.Saw PT in September 2019 at a different facility for plantar fasciitis but reports they were not able to help.  Patient frequently loses balance but does not fall to the ground as she is able to catch herself. She cannot squat or  lunge now without losing balance and wants to be able to return to these activities. 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    Limitations  Sitting;Lifting;Standing;Walking;House hold activities;Other (comment)    How long can you sit comfortably?  limited by ADHD not by pain    How long can you stand comfortably?  stands all day for work: hairdresser    How long can you walk comfortably?  hurts her feet to walk immediately    Patient Stated Goals  to be able to do a squat or lunge without falling  over.    Currently in Pain?  Yes    Pain Score  3     Pain Location  Foot    Pain Orientation  Right;Left    Pain Descriptors / Indicators  Burning;Numbness    Pain Type  Neuropathic pain    Pain Radiating Towards  occasionally radiates from back    Pain Onset  More than a month ago    Pain Frequency  Intermittent    Aggravating Factors   walking    Pain Relieving Factors  resting    Effect of Pain on Daily Activities  limits stability and ability to walk       Patient is a pleasant 48 year old female who presents for balance training/polyneuropathy. PMH includes VSD(ventricular septal defect), allergic rhinitis, UTI, depression, migraines, polyneuropathy, bilateral carpal tunnel syndrome, ADD, anxiety, basal cell carcinoma, fibroids, GERD, and two children by C section.Saw PT in September 2019 at a different facility for plantar fasciitis but reports they were not able to help.  Patient frequently loses balance but does not fall to the ground as she is able to catch herself. She cannot squat or lunge now without losing balance and wants to be able to return to these activities. 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  [facility] PT Assessment - 05/18/19 0001      Assessment   Medical Diagnosis  polyneuropathy, balance    Referring Provider (PT)  hemang shah     Onset Date/Surgical Date  --   2 years ago, gradual onset    Hand Dominance  Right    Prior Therapy  yes      Precautions   Precautions  None      Restrictions   Weight Bearing Restrictions  No      Balance Screen   Has the patient fallen in the past 6 months  Yes    How many times?  1    Has the patient had a decrease in activity level because of a fear of falling?   Yes    Is the patient reluctant to leave their home because of a fear of falling?   Yes      [place] residence    Living Arrangements  Spouse/significant other;Children    Available Help at Discharge  Family     Type of [place] Access  Level entry    [place]  Two level    Alternate Level Stairs-Rails  Left   up on left, down on right      Prior Function   Level of Independence  Independent    Vocation  Full time employment    Vocation Requirements  hairdresser       Cognition   Overall Cognitive Status  Within Functional Limits for tasks assessed  Balance   Balance Assessed  Yes      Standardized Balance Assessment   Standardized Balance Assessment  Berg Balance Test      Berg Balance Test   Sit to Stand  Able to stand without using hands and stabilize independently    Standing Unsupported  Able to stand safely 2 minutes    Sitting with Back Unsupported but Feet Supported on Floor or Stool  Able to sit safely and securely 2 minutes    Stand to Sit  Sits safely with minimal use of hands    Transfers  Able to transfer safely, minor use of hands    Standing Unsupported with Eyes Closed  Able to stand 10 seconds with supervision    Standing Unsupported with Feet Together  Able to place feet together independently and stand for 1 minute with supervision    From Standing, Reach Forward with Outstretched Arm  Can reach forward >5 cm safely (2")    From Standing Position, Pick up Object from Floor  Able to pick up shoe, needs supervision    From Standing Position, Turn to Look Behind Over each Shoulder  Looks behind from both sides and weight shifts well    Turn 360 Degrees  Able to turn 360 degrees safely but slowly    Standing Unsupported, Alternately Place Feet on Step/Stool  Able to complete >2 steps/needs minimal assist    Standing Unsupported, One Foot in Front  Needs help to step but can hold 15 seconds    Standing on One Leg  Tries to lift leg/unable to hold 3 seconds but remains standing independently    Total Score  40     Goals: to be able to do a squat or lunge without falling over.    PAIN: Numbness and tingling: constant in bilateral feet, both feet   Worse pain: 10/10; occasionally  POSTURE: Seated: slight forward head rounded shoulders Standing: heel toe rocking   PROM/AROM: Limited hamstring length bilaterally with, passive overpressure ~40 degrees bilaterally prior to pain  STRENGTH:  Graded on a 0-5 scale Muscle Group Left Right  Hip Flex 4/5 4/5  Hip Abd 3/5 3/5  Hip Add 2/5 2/5  Hip Ext 2/5 2/5  Hip IR/ER    Knee Flex 4/5 4/5  Knee Ext 4/5 4/5  Ankle DF 3+/5 3+/5  Ankle PF 3+/5 3+/5   SENSATION: Impaired sensation from ankles down bilaterally   SPECIAL TESTS: SLR: painful BLE Slump: negative for head movement but decreased pain with foot movement  FUNCTIONAL MOBILITY: Squat: loss of balance forward with limited ankle and foot reactions Lunge: loss of balance with attempt to attain position  Sit to stand : no UE support required however does have limited stability in full stand with slight forward rocking.   BALANCE: Static Sitting Balance  Normal Able to maintain balance against maximal resistance   Good Able to maintain balance against moderate resistance   Good-/Fair+ Accepts minimal resistance x  Fair Able to sit unsupported without balance loss and without UE support   Poor+ Able to maintain with Minimal assistance from individual or chair   Poor Unable to maintain balance-requires mod/max support from individual or chair     Static Standing Balance  Normal Able to maintain standing balance against maximal resistance   Good Able to maintain standing balance against moderate resistance   Good-/Fair+ Able to maintain standing balance against minimal resistance   Fair Able to stand unsupported without UE support and without  LOB for 1-2 min x  Fair- Requires Min A and UE support to maintain standing without loss of balance   Poor+ Requires mod A and UE support to maintain standing without loss of balance   Poor Requires max A and UE support to maintain standing balance without loss     Dynamic Sitting  Balance  Normal Able to sit unsupported and weight shift across midline maximally   Good Able to sit unsupported and weight shift across midline moderately   Good-/Fair+ Able to sit unsupported and weight shift across midline minimally x  Fair Minimal weight shifting ipsilateral/front, difficulty crossing midline   Fair- Reach to ipsilateral side and unable to weight shift   Poor + Able to sit unsupported with min A and reach to ipsilateral side, unable to weight shift   Poor Able to sit unsupported with mod A and reach ipsilateral/front-can't cross midline     Standing Dynamic Balance  Normal Stand independently unsupported, able to weight shift and cross midline maximally   Good Stand independently unsupported, able to weight shift and cross midline moderately   Good-/Fair+ Stand independently unsupported, able to weight shift across midline minimally   Fair Stand independently unsupported, weight shift, and reach ipsilaterally, loss of balance when crossing midline x  Poor+ Able to stand with Min A and reach ipsilaterally, unable to weight shift   Poor Able to stand with Mod A and minimally reach ipsilaterally, unable to cross midline.      GAIT: Ambulates with shortened step lengths bilaterally with limited heel strike and toe off. Does not use an AD.   OUTCOME MEASURES: TEST Outcome Interpretation  5 times sit<>stand 6 seconds no UE support  >31 yo, >15 sec indicates increased risk for falls  10 MWT >1.0 m/s    ABC:  61.8%       Berg Balance Assessment 40/56 <36/56 (100% risk for falls), 37-45 (80% risk for falls); 46-51 (>50% risk for falls); 52-55 (lower risk <25% of falls)        Patient presents with limited hamstring length bilaterally with nerve tension bilaterally. Pain not reproduced with slump test however is able to be reproduced with supine nerve glides with reduction of radiating symptoms. Patient has limited stability and coordination with poor spatial awareness and  ankle righting reactions. Patient is unable to perform a squat or a lunge without LOB with little to no forefoot/ankle reactions requiring UE support for stabilization. Patient will benefit from skilled physical therapy to decrease pain, improve coordination and stability, and improve mobility for return to functional activities and decrease falls risk.     Treat: Access Code: [email or web address]  URL: [email or web address]  Date: 05/18/2019  Prepared by: Janna Arch   Exercises Seated Hamstring Stretch - 2 reps - 2 sets - 30 hold - 1x daily - 7x weekly Seated Ankle Alphabet - 10 reps - 2 sets - 2 hold - 1x daily - 7x weekly Supine modified nerve glide with hamstring stretch 10x df/pf       Objective measurements completed on examination: See above findings.              PT Education - 05/18/19 1733    Education Details  POC, goals, HEP    Person(s) Educated  Patient    Methods  Explanation;Demonstration;Tactile cues;Verbal cues;Handout    Comprehension  Verbalized understanding;Returned demonstration;Verbal cues required;Tactile cues required       PT Short Term Goals - 05/18/19 1739      PT  SHORT TERM GOAL #1   Title  Patient will be independent in home exercise program to improve strength/mobility for better functional independence with ADLs.    Baseline  7/6: HEP given    Time  2    Period  Weeks    Status  New    Target Date  06/01/19        PT Long Term Goals - 05/18/19 1739      PT LONG TERM GOAL #1   Title  Patient will increase Berg Balance score by > 6 points (46/56) to demonstrate decreased fall risk during functional activities.    Baseline  7/6: 40/56    Time  8    Period  Weeks    Status  New    Target Date  07/13/19      PT LONG TERM GOAL #2   Title  Patient will increase ABC scale score >80% to demonstrate better functional mobility and better confidence with ADLs.    Baseline  7/6: 40/56    Time  8    Period  Weeks    Status   New    Target Date  07/13/19      PT LONG TERM GOAL #3   Title  Patient will perform a squat and/or a lunge without using UE assistance or losing balance to return to previous exercise program.    Baseline  7/6: loses balance upon attempt    Time  8    Period  Weeks    Status  New    Target Date  07/13/19      PT LONG TERM GOAL #4   Title  Patient will be independent in walking on even/uneven surface with least restrictive assistive device, for 20+ minutes without rest break, reporting some difficulty or less to improve walking tolerance with community ambulation including grocery shopping, going to church,etc.    Baseline  7/6: unable to walk for long durations without severe pain    Time  8    Period  Weeks    Status  New    Target Date  07/13/19             Plan - 05/18/19 1734    Clinical Impression Statement  Patient presents with limited hamstring length bilaterally with nerve tension bilaterally. Pain not reproduced with slump test however is able to be reproduced with supine nerve glides with reduction of radiating symptoms. Patient has limited stability and coordination with poor spatial awareness and ankle righting reactions. Patient is unable to perform a squat or a lunge without LOB with little to no forefoot/ankle reactions requiring UE support for stabilization. Patient will benefit from skilled physical therapy to decrease pain, improve coordination and stability, and improve mobility for return to functional activities and decrease falls risk.    Personal Factors and Comorbidities  Comorbidity 3+;Fitness;Past/Current Experience;Profession;Social Background;Time since onset of injury/illness/exacerbation    Comorbidities  cervical cancer, bilateral carpal tunnel syndrome, polyneuropathy, depression, migraines, VSD, ADD, anxiety, GERD    Examination-Activity Limitations  Bend;Caring for Others;Carry;Dressing;Lift;Locomotion Level;Reach  Overhead;Squat;Stairs;Stand;Toileting;Transfers    Examination-Participation Restrictions  Church;Cleaning;Community Activity;Driving;Interpersonal Relationship;Laundry;Shop;Volunteer;Yard Work    [employer] officer  Evolving/Moderate complexity    Clinical Decision Making  Moderate    Rehab Potential  Fair    PT Frequency  2x / week    PT Duration  8 weeks    PT Treatment/Interventions  ADLs/Self Care Home Management;Cryotherapy;Electrical Stimulation;Iontophoresis 4mg /ml Dexamethasone;Moist Heat;Traction;Ultrasound;DME Instruction;Gait training;Stair training;Functional mobility training;Therapeutic activities;Therapeutic exercise;Balance training;Neuromuscular  re-education;Manual techniques;Passive range of motion;Dry needling;Vestibular;Taping;Splinting;Energy conservation;Patient/family education    PT Next Visit Plan  nerve glides, airex pad, coordination, hamstring stretch    PT Home Exercise Plan  see above    Consulted and Agree with Plan of Care  Patient       Patient will benefit from skilled therapeutic intervention in order to improve the following deficits and impairments:  Abnormal gait, Decreased activity tolerance, Decreased balance, Decreased endurance, Decreased coordination, Decreased mobility, Difficulty walking, Decreased strength, Impaired flexibility, Impaired perceived functional ability, Impaired sensation, Postural dysfunction, Improper body mechanics, Pain  Visit Diagnosis: 1. Unsteadiness on feet   2. Other abnormalities of gait and mobility        Problem List Patient Active Problem List   Diagnosis Date Noted  . Bilateral carpal tunnel syndrome 08/27/2018  . Intractable migraine with aura without status migrainosus 08/27/2018  . Polyneuropathy 08/27/2018  . Cervical cancer ([place]) 08/18/2018  . Chest pain 09/23/2017  . Elevated TSH 07/03/2017  . Menorrhagia with irregular cycle 09/17/2016  . Chronic idiopathic urticaria 09/03/2016  .  Postoperative state 08/20/2016  . Calcaneal spur 07/10/2016  . On prednisone therapy 06/20/2016  . Allergic rhinitis 04/17/2016  . Allergic urticaria due to ingested food 02/09/2016  . Heavy drinker 04/11/2014  . Depression 09/30/2013  . Irregular bleeding 09/30/2013  . Migraine headache 09/30/2013  . Excess weight 09/30/2013  . Frequent UTI 09/30/2013  . Infection of urinary tract 09/30/2013  . VSD (ventricular septal defect and aortic arch hypoplasia 08/05/2013  . Abnormal weight gain 08/05/2013   Janna Arch, PT, DPT   05/18/2019, 5:44 PM  [place] MAIN [facility] SERVICES [address] [place], [place], [address] Phone: [phone]   Fax:  [phone]  Name: Kristine [name] MRN: [id number] Date of Birth: [birth year]

## 2019-05-20 ENCOUNTER — Ambulatory Visit: Payer: 59

## 2019-05-20 ENCOUNTER — Other Ambulatory Visit: Payer: Self-pay

## 2019-05-20 DIAGNOSIS — R2681 Unsteadiness on feet: Secondary | ICD-10-CM | POA: Diagnosis not present

## 2019-05-20 DIAGNOSIS — R2689 Other abnormalities of gait and mobility: Secondary | ICD-10-CM

## 2019-05-20 NOTE — Therapy (Signed)
[place] MAIN [facility] SERVICES [address] [place], [place], [address] Phone: [phone]   Fax:  [phone]  Physical Therapy Treatment  Patient Details  Name: Kristine [name] MRN: [id number] Date of Birth: 1971/02/26 Referring Provider (PT): hemang shah    Encounter Date: 05/20/2019  PT End of Session - 05/21/19 0909    Visit Number  2    Number of Visits  16    Date for PT Re-Evaluation  07/13/19    Authorization Type  2/10 eval start 05/18/19    PT Start Time  1646    PT Stop Time  1730    PT Time Calculation (min)  44 min    Equipment Utilized During Treatment  Gait belt    Activity Tolerance  Patient tolerated treatment well;Patient limited by pain    Behavior During Therapy  WFL for tasks assessed/performed       Past Medical History:  Diagnosis Date  . Abnormal weight gain 08/05/2013  . Absence of interventricular septum 08/05/2013  . Allergic rhinitis 04/17/2016  . Allergic urticaria due to ingested food 02/09/2016  . Anxiety   . Asthma   . Clinical depression 09/30/2013  . Complication of anesthesia    PT HAD TROUBLE BREATHING AFTER HYSTERECTOMY IN OCTOBER  . Depression   . Dizziness   . Excess weight 09/30/2013  . GERD (gastroesophageal reflux disease)   . Headache, migraine 09/30/2013  . Heart murmur   . Heavy drinker 04/11/2014   Overview:  Patient reports drinking 2-3 bottles of wine each weekend.    . History of methicillin resistant staphylococcus aureus (MRSA) 11/2015  . Infection of urinary tract 09/30/2013  . Irregular bleeding 09/30/2013  . Multiple allergies   . VSD (ventricular septal defect and aortic arch hypoplasia     Past Surgical History:  Procedure Laterality Date  . BOTOX INJECTION N/A 10/23/2016   Procedure: BOTOX INJECTION;  Surgeon: Royston Cowper, MD;  Location: [facility];  Service: Urology;  Laterality: N/A;  . BREAST BIOPSY Left 11/13/13   benign  . CESAREAN SECTION    . COLONOSCOPY    .  CORONARY ANGIOPLASTY    . CYSTOSCOPY N/A 10/23/2016   Procedure: CYSTOSCOPY;  Surgeon: Royston Cowper, MD;  Location: [facility];  Service: Urology;  Laterality: N/A;  . CYSTOSCOPY WITH HYDRODISTENSION AND BIOPSY    . LAPAROSCOPIC ASSISTED VAGINAL HYSTERECTOMY N/A 08/20/2016   Procedure: LAPAROSCOPIC ASSISTED VAGINAL HYSTERECTOMY;  Surgeon: Boykin Nearing, MD;  Location: [facility];  Service: Gynecology;  Laterality: N/A;  . LAPAROSCOPIC BILATERAL SALPINGECTOMY Bilateral 08/20/2016   Procedure: LAPAROSCOPIC BILATERAL SALPINGECTOMY;  Surgeon: Boykin Nearing, MD;  Location: [facility];  Service: Gynecology;  Laterality: Bilateral;  . NASAL SINUS SURGERY     x12    There were no vitals filed for this visit.  Subjective Assessment - 05/21/19 0902    Subjective  Patient presents after work to physical therapy, reporting she has to return back to work after session. Is having frequent migraines. Has been compliant with HEP    Pertinent History  Patient is a pleasant 48 year old female who presents for balance training/polyneuropathy. PMH includes VSD(ventricular septal defect), allergic rhinitis, UTI, depression, migraines, polyneuropathy, bilateral carpal tunnel syndrome, ADD, anxiety, basal cell carcinoma, fibroids, GERD, and two children by C section.Saw PT in September 2019 at a different facility for plantar fasciitis but reports they were not able to help.  Patient frequently loses balance but does not  fall to the ground as she is able to catch herself. She cannot squat or lunge now without losing balance and wants to be able to return to these activities. 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    Limitations  Sitting;Lifting;Standing;Walking;House hold activities;Other (comment)    How long can you sit comfortably?  limited by ADHD not by pain    How long can you stand comfortably?  stands all day for work: hairdresser    How long can you walk comfortably?  hurts her feet to  walk immediately    Patient Stated Goals  to be able to do a squat or lunge without falling over.    Currently in Pain?  Yes    Pain Score  3     Pain Location  Foot    Pain Orientation  Right;Left    Pain Descriptors / Indicators  Burning    Pain Type  Neuropathic pain    Pain Onset  More than a month ago    Pain Frequency  Intermittent       Treatment:  Supine: Hamstring stretch 60 seconds each LE Nerve glides SLR df/pf glide 20x each LE Nerve glides bent knee 90 90 5x5 each LE LAD each LE, 10 second range x 6 each,  Posterior pelvic tilt 15x with cueing for decreasing amplitude of tilt to decrease discomfort.  TrA contraction 12x 3 second holds with cueing for muscle activation/sequencing TrA contraction with single leg marching with max cueing for muscle sequencing/activation 10x each LE TrA activation pushing into swiss ball with knees and hands 10x 3 second holds Gluteal activation; 10x 3 seconds with cueing for gluteal activation to decrease back activation. LE rotation for low back reduction of tightness  Standing: Airex pad: static stand; 2x30 seconds with noticeable trembling of feet and LE Airex pad: modified tandem: 2x30 seconds each LE; back pain present with worsening symptoms of LE's Sit to stand squat on edge of bed with cueing for posterior gluteal movement for reduction of knee movement over foot position for proper alignment. 12x                           PT Education - 05/21/19 0903    Education Details  exercise technique, stability, nerve glides    Person(s) Educated  Patient    Methods  Explanation;Demonstration;Tactile cues;Verbal cues    Comprehension  Verbalized understanding;Returned demonstration;Verbal cues required;Tactile cues required       PT Short Term Goals - 05/18/19 1739      PT SHORT TERM GOAL #1   Title  Patient will be independent in home exercise program to improve strength/mobility for better functional  independence with ADLs.    Baseline  7/6: HEP given    Time  2    Period  Weeks    Status  New    Target Date  06/01/19        PT Long Term Goals - 05/18/19 1739      PT LONG TERM GOAL #1   Title  Patient will increase Berg Balance score by > 6 points (46/56) to demonstrate decreased fall risk during functional activities.    Baseline  7/6: 40/56    Time  8    Period  Weeks    Status  New    Target Date  07/13/19      PT LONG TERM GOAL #2   Title  Patient  will increase ABC scale score >80% to demonstrate better functional mobility and better confidence with ADLs.    Baseline  7/6: 40/56    Time  8    Period  Weeks    Status  New    Target Date  07/13/19      PT LONG TERM GOAL #3   Title  Patient will perform a squat and/or a lunge without using UE assistance or losing balance to return to previous exercise program.    Baseline  7/6: loses balance upon attempt    Time  8    Period  Weeks    Status  New    Target Date  07/13/19      PT LONG TERM GOAL #4   Title  Patient will be independent in walking on even/uneven surface with least restrictive assistive device, for 20+ minutes without rest break, reporting some difficulty or less to improve walking tolerance with community ambulation including grocery shopping, going to church,etc.    Baseline  7/6: unable to walk for long durations without severe pain    Time  8    Period  Weeks    Status  New    Target Date  07/13/19            Plan - 05/21/19 0910    Clinical Impression Statement  Patient presents with worsening of symptoms with back pain reducing stability. Patient educated on core stability and nerve glides for reduction of symptoms. Patient has limited ankle and foot reactions resulting in reliance upon knee and hip righting responses. Patient will benefit from skilled physical therapy to decrease pain, improve coordination and stability, and improve mobility for return to functional activities and decrease  falls risk.    Personal Factors and Comorbidities  Comorbidity 3+;Fitness;Past/Current Experience;Profession;Social Background;Time since onset of injury/illness/exacerbation    Comorbidities  cervical cancer, bilateral carpal tunnel syndrome, polyneuropathy, depression, migraines, VSD, ADD, anxiety, GERD    Examination-Activity Limitations  Bend;Caring for Others;Carry;Dressing;Lift;Locomotion Level;Reach Overhead;Squat;Stairs;Stand;Toileting;Transfers    Examination-Participation Restrictions  Church;Cleaning;Community Activity;Driving;Interpersonal Relationship;Laundry;Shop;Volunteer;Yard Work    [employer]chant [employer] officer  Evolving/Moderate complexity    Rehab Potential  Fair    PT Frequency  2x / week    PT Duration  8 weeks    PT Treatment/Interventions  ADLs/Self Care Home Management;Cryotherapy;Electrical Stimulation;Iontophoresis 4mg /ml Dexamethasone;Moist Heat;Traction;Ultrasound;DME Instruction;Gait training;Stair training;Functional mobility training;Therapeutic activities;Therapeutic exercise;Balance training;Neuromuscular re-education;Manual techniques;Passive range of motion;Dry needling;Vestibular;Taping;Splinting;Energy conservation;Patient/family education    PT Next Visit Plan  nerve glides, airex pad, coordination, hamstring stretch    PT Home Exercise Plan  see above    Consulted and Agree with Plan of Care  Patient       Patient will benefit from skilled therapeutic intervention in order to improve the following deficits and impairments:  Abnormal gait, Decreased activity tolerance, Decreased balance, Decreased endurance, Decreased coordination, Decreased mobility, Difficulty walking, Decreased strength, Impaired flexibility, Impaired perceived functional ability, Impaired sensation, Postural dysfunction, Improper body mechanics, Pain  Visit Diagnosis: 1. Unsteadiness on feet   2. Other abnormalities of gait and mobility        Problem List Patient Active  Problem List   Diagnosis Date Noted  . Bilateral carpal tunnel syndrome 08/27/2018  . Intractable migraine with aura without status migrainosus 08/27/2018  . Polyneuropathy 08/27/2018  . Cervical cancer ([place]) 08/18/2018  . Chest pain 09/23/2017  . Elevated TSH 07/03/2017  . Menorrhagia with irregular cycle 09/17/2016  . Chronic idiopathic urticaria 09/03/2016  . Postoperative state 08/20/2016  . Calcaneal  spur 07/10/2016  . On prednisone therapy 06/20/2016  . Allergic rhinitis 04/17/2016  . Allergic urticaria due to ingested food 02/09/2016  . Heavy drinker 04/11/2014  . Depression 09/30/2013  . Irregular bleeding 09/30/2013  . Migraine headache 09/30/2013  . Excess weight 09/30/2013  . Frequent UTI 09/30/2013  . Infection of urinary tract 09/30/2013  . VSD (ventricular septal defect and aortic arch hypoplasia 08/05/2013  . Abnormal weight gain 08/05/2013   Janna Arch, PT, DPT   05/21/2019, 9:11 AM  [place] [facility] MAIN [facility] SERVICES [address] [place], [place], [address] Phone: [phone]   Fax:  [phone]  Name: MLISS WEDIN MRN: [id number] Date of Birth: Dec 06, 1970

## 2019-05-25 ENCOUNTER — Ambulatory Visit: Payer: 59

## 2019-05-25 ENCOUNTER — Other Ambulatory Visit: Payer: Self-pay

## 2019-05-25 DIAGNOSIS — R2689 Other abnormalities of gait and mobility: Secondary | ICD-10-CM

## 2019-05-25 DIAGNOSIS — R2681 Unsteadiness on feet: Secondary | ICD-10-CM

## 2019-05-25 NOTE — Therapy (Signed)
[place] MAIN [facility] SERVICES [address] [place], [place], [address] Phone: [phone]   Fax:  [phone]  Physical Therapy Treatment  Patient Details  Name: Kristine [name] MRN: [id number] Date of Birth: 06-Apr-1971 Referring Provider (PT): hemang shah    Encounter Date: 05/25/2019  PT End of Session - 05/25/19 1744    Visit Number  3    Number of Visits  16    Date for PT Re-Evaluation  07/13/19    Authorization Type  3/10 eval start 05/18/19    PT Start Time  1648    PT Stop Time  1730    PT Time Calculation (min)  42 min    Equipment Utilized During Treatment  Gait belt    Activity Tolerance  Patient tolerated treatment well;Patient limited by pain    Behavior During Therapy  WFL for tasks assessed/performed       Past Medical History:  Diagnosis Date  . Abnormal weight gain 08/05/2013  . Absence of interventricular septum 08/05/2013  . Allergic rhinitis 04/17/2016  . Allergic urticaria due to ingested food 02/09/2016  . Anxiety   . Asthma   . Clinical depression 09/30/2013  . Complication of anesthesia    PT HAD TROUBLE BREATHING AFTER HYSTERECTOMY IN OCTOBER  . Depression   . Dizziness   . Excess weight 09/30/2013  . GERD (gastroesophageal reflux disease)   . Headache, migraine 09/30/2013  . Heart murmur   . Heavy drinker 04/11/2014   Overview:  Patient reports drinking 2-3 bottles of wine each weekend.    . History of methicillin resistant staphylococcus aureus (MRSA) 11/2015  . Infection of urinary tract 09/30/2013  . Irregular bleeding 09/30/2013  . Multiple allergies   . VSD (ventricular septal defect and aortic arch hypoplasia     Past Surgical History:  Procedure Laterality Date  . BOTOX INJECTION N/A 10/23/2016   Procedure: BOTOX INJECTION;  Surgeon: Royston Cowper, MD;  Location: [facility];  Service: Urology;  Laterality: N/A;  . BREAST BIOPSY Left 11/13/13   benign  . CESAREAN SECTION    . COLONOSCOPY    .  CORONARY ANGIOPLASTY    . CYSTOSCOPY N/A 10/23/2016   Procedure: CYSTOSCOPY;  Surgeon: Royston Cowper, MD;  Location: [facility];  Service: Urology;  Laterality: N/A;  . CYSTOSCOPY WITH HYDRODISTENSION AND BIOPSY    . LAPAROSCOPIC ASSISTED VAGINAL HYSTERECTOMY N/A 08/20/2016   Procedure: LAPAROSCOPIC ASSISTED VAGINAL HYSTERECTOMY;  Surgeon: Boykin Nearing, MD;  Location: [facility];  Service: Gynecology;  Laterality: N/A;  . LAPAROSCOPIC BILATERAL SALPINGECTOMY Bilateral 08/20/2016   Procedure: LAPAROSCOPIC BILATERAL SALPINGECTOMY;  Surgeon: Boykin Nearing, MD;  Location: [facility];  Service: Gynecology;  Laterality: Bilateral;  . NASAL SINUS SURGERY     x12    There were no vitals filed for this visit.  Subjective Assessment - 05/25/19 1743    Subjective  Patient reports she has had increased foot burning today. Has been out with her grandchildren sweating a lot. Compliant with HEP while gone at the beach.    Pertinent History  Patient is a pleasant 48 year old female who presents for balance training/polyneuropathy. PMH includes VSD(ventricular septal defect), allergic rhinitis, UTI, depression, migraines, polyneuropathy, bilateral carpal tunnel syndrome, ADD, anxiety, basal cell carcinoma, fibroids, GERD, and two children by C section.Saw PT in September 2019 at a different facility for plantar fasciitis but reports they were not able to help.  Patient frequently loses balance but does not  fall to the ground as she is able to catch herself. She cannot squat or lunge now without losing balance and wants to be able to return to these activities. 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    Limitations  Sitting;Lifting;Standing;Walking;House hold activities;Other (comment)    How long can you sit comfortably?  limited by ADHD not by pain    How long can you stand comfortably?  stands all day for work: hairdresser    How long can you walk comfortably?  hurts her feet to walk  immediately    Patient Stated Goals  to be able to do a squat or lunge without falling over.    Currently in Pain?  Yes    Pain Score  5     Pain Location  Foot    Pain Orientation  Right;Left    Pain Descriptors / Indicators  Burning    Pain Type  Neuropathic pain    Pain Onset  More than a month ago    Pain Frequency  Constant    Aggravating Factors   walking         Hands on bar: gluteal/pelvic tilt into modified squat with chair behind and CGA. 10x  Hands on bar: modified squat with pulse squat 5x5 with CGA  Hands on bars; pause modified squat with chair behind 10x 3 second holds.   Lunge progression, Hand on bar/support, bring posterior leg back, flex/extend posterior knee for partial modified lunge 10x each LE  eccentric heel tap 2" step 10x each LE, finger tip support Eccentric heel tap 6" step 10x each LE, finger tip support, cueing for widening BOS for muscle activation.   airex balance beam; lateral stepping 8x length of // bars CGA; cueing for neutral foot alignment due to preference for external rotation of RLE.   airex balance beam tandem walking 8x length of // bars CGA; decrease support from finger tip to no UE support, very challenging to patient.   Df stretch on rocker 30 seconds each LE  Adduction cross body swing 10x, terminated due to slight pain in low back.  Standing IT band stretch 60 seconds each LE  Seated ball squeeze between feet with LAQ and abdominal activation   Standing calf stretch 30 seconds each LE  6" step calf stretch each LE. BUE support                              PT Education - 05/25/19 1744    Education Details  exercise technique, stability, squat/lunge progression    Person(s) Educated  Patient    Methods  Explanation;Demonstration;Tactile cues;Verbal cues    Comprehension  Verbalized understanding;Returned demonstration;Verbal cues required;Tactile cues required       PT Short Term Goals -  05/18/19 1739      PT SHORT TERM GOAL #1   Title  Patient will be independent in home exercise program to improve strength/mobility for better functional independence with ADLs.    Baseline  7/6: HEP given    Time  2    Period  Weeks    Status  New    Target Date  06/01/19        PT Long Term Goals - 05/18/19 1739      PT LONG TERM GOAL #1   Title  Patient will increase Berg Balance score by > 6 points (46/56) to demonstrate decreased fall risk during functional  activities.    Baseline  7/6: 40/56    Time  8    Period  Weeks    Status  New    Target Date  07/13/19      PT LONG TERM GOAL #2   Title  Patient will increase ABC scale score >80% to demonstrate better functional mobility and better confidence with ADLs.    Baseline  7/6: 40/56    Time  8    Period  Weeks    Status  New    Target Date  07/13/19      PT LONG TERM GOAL #3   Title  Patient will perform a squat and/or a lunge without using UE assistance or losing balance to return to previous exercise program.    Baseline  7/6: loses balance upon attempt    Time  8    Period  Weeks    Status  New    Target Date  07/13/19      PT LONG TERM GOAL #4   Title  Patient will be independent in walking on even/uneven surface with least restrictive assistive device, for 20+ minutes without rest break, reporting some difficulty or less to improve walking tolerance with community ambulation including grocery shopping, going to church,etc.    Baseline  7/6: unable to walk for long durations without severe pain    Time  8    Period  Weeks    Status  New    Target Date  07/13/19            Plan - 05/25/19 1745    Clinical Impression Statement  Patient presents to physical therapy with good motivation despite increased pain. Patient progressing with functional stability with ankle righting reactions and decreased episodes of LOB. Patient performed squat and lunge progressions with noticeable fatigue of LLE >RLE. Patient  does have increased episodes of cramping by end of session, educated on need for proper hydration. Patient will benefit from skilled physical therapy to decrease pain, improve coordination and stability, and improve mobility for return to functional activities and decrease falls risk.    Personal Factors and Comorbidities  Comorbidity 3+;Fitness;Past/Current Experience;Profession;Social Background;Time since onset of injury/illness/exacerbation    Comorbidities  cervical cancer, bilateral carpal tunnel syndrome, polyneuropathy, depression, migraines, VSD, ADD, anxiety, GERD    Examination-Activity Limitations  Bend;Caring for Others;Carry;Dressing;Lift;Locomotion Level;Reach Overhead;Squat;Stairs;Stand;Toileting;Transfers    Examination-Participation Restrictions  Church;Cleaning;Community Activity;Driving;Interpersonal Relationship;Laundry;Shop;Volunteer;Yard Work    [employer]chant [employer] officer  Evolving/Moderate complexity    Rehab Potential  Fair    PT Frequency  2x / week    PT Duration  8 weeks    PT Treatment/Interventions  ADLs/Self Care Home Management;Cryotherapy;Electrical Stimulation;Iontophoresis 4mg /ml Dexamethasone;Moist Heat;Traction;Ultrasound;DME Instruction;Gait training;Stair training;Functional mobility training;Therapeutic activities;Therapeutic exercise;Balance training;Neuromuscular re-education;Manual techniques;Passive range of motion;Dry needling;Vestibular;Taping;Splinting;Energy conservation;Patient/family education    PT Next Visit Plan  nerve glides, airex pad, coordination, hamstring stretch    PT Home Exercise Plan  see above    Consulted and Agree with Plan of Care  Patient       Patient will benefit from skilled therapeutic intervention in order to improve the following deficits and impairments:  Abnormal gait, Decreased activity tolerance, Decreased balance, Decreased endurance, Decreased coordination, Decreased mobility, Difficulty walking, Decreased  strength, Impaired flexibility, Impaired perceived functional ability, Impaired sensation, Postural dysfunction, Improper body mechanics, Pain  Visit Diagnosis: 1. Unsteadiness on feet   2. Other abnormalities of gait and mobility        Problem List Patient Active Problem List  Diagnosis Date Noted  . Bilateral carpal tunnel syndrome 08/27/2018  . Intractable migraine with aura without status migrainosus 08/27/2018  . Polyneuropathy 08/27/2018  . Cervical cancer ([place]) 08/18/2018  . Chest pain 09/23/2017  . Elevated TSH 07/03/2017  . Menorrhagia with irregular cycle 09/17/2016  . Chronic idiopathic urticaria 09/03/2016  . Postoperative state 08/20/2016  . Calcaneal spur 07/10/2016  . On prednisone therapy 06/20/2016  . Allergic rhinitis 04/17/2016  . Allergic urticaria due to ingested food 02/09/2016  . Heavy drinker 04/11/2014  . Depression 09/30/2013  . Irregular bleeding 09/30/2013  . Migraine headache 09/30/2013  . Excess weight 09/30/2013  . Frequent UTI 09/30/2013  . Infection of urinary tract 09/30/2013  . VSD (ventricular septal defect and aortic arch hypoplasia 08/05/2013  . Abnormal weight gain 08/05/2013   Janna Arch, PT, DPT   05/25/2019, 5:47 PM  [place] MAIN [facility] SERVICES [address] [place], [place], [address] Phone: [phone]   Fax:  [phone]  Name: Kristine [name] MRN: [id number] Date of Birth: 05-Jun-1971

## 2019-05-27 ENCOUNTER — Ambulatory Visit: Payer: 59

## 2019-06-01 ENCOUNTER — Ambulatory Visit: Payer: 59

## 2019-06-01 ENCOUNTER — Other Ambulatory Visit: Payer: Self-pay | Admitting: Psychiatry

## 2019-06-01 DIAGNOSIS — F411 Generalized anxiety disorder: Secondary | ICD-10-CM

## 2019-06-03 ENCOUNTER — Ambulatory Visit: Payer: 59

## 2019-06-07 IMAGING — MG MM DIGITAL SCREENING BILAT W/ TOMO W/ CAD
8 series · 8 of 24 positions shown · non-contrast
Comparison: Previous exam(s).

CLINICAL DATA: Screening.

EXAM:
DIGITAL SCREENING BILATERAL MAMMOGRAM WITH TOMO AND CAD

[R CC synth-2D]
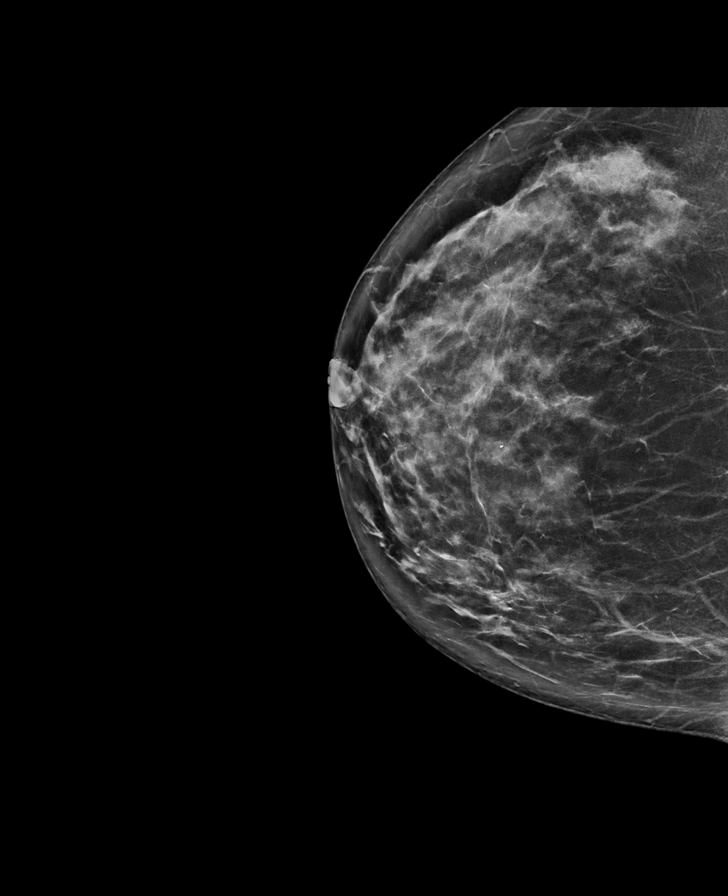

[L MLO synth-2D]
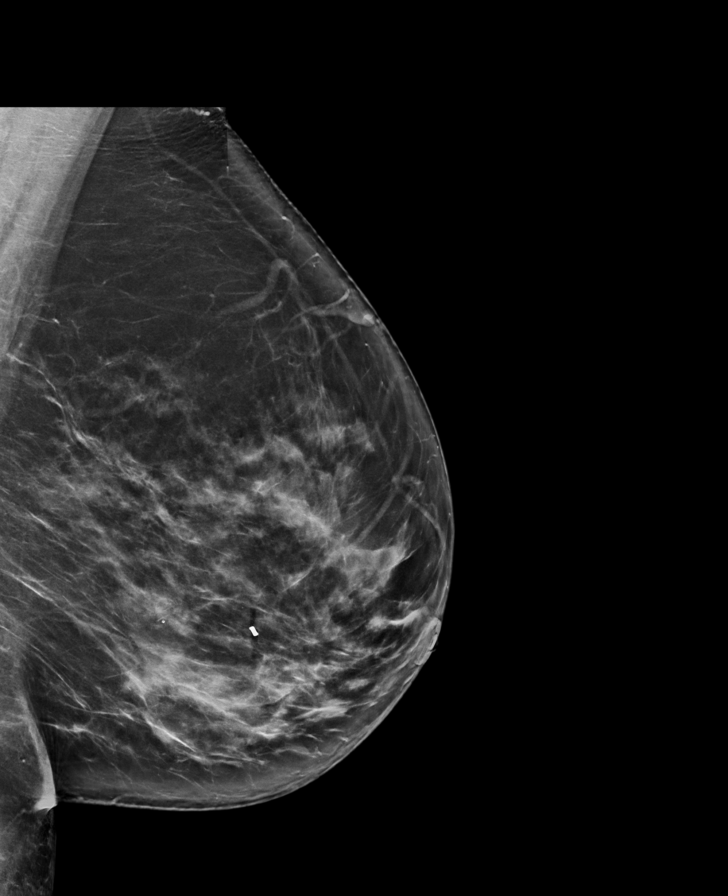

[R MLO synth-2D]
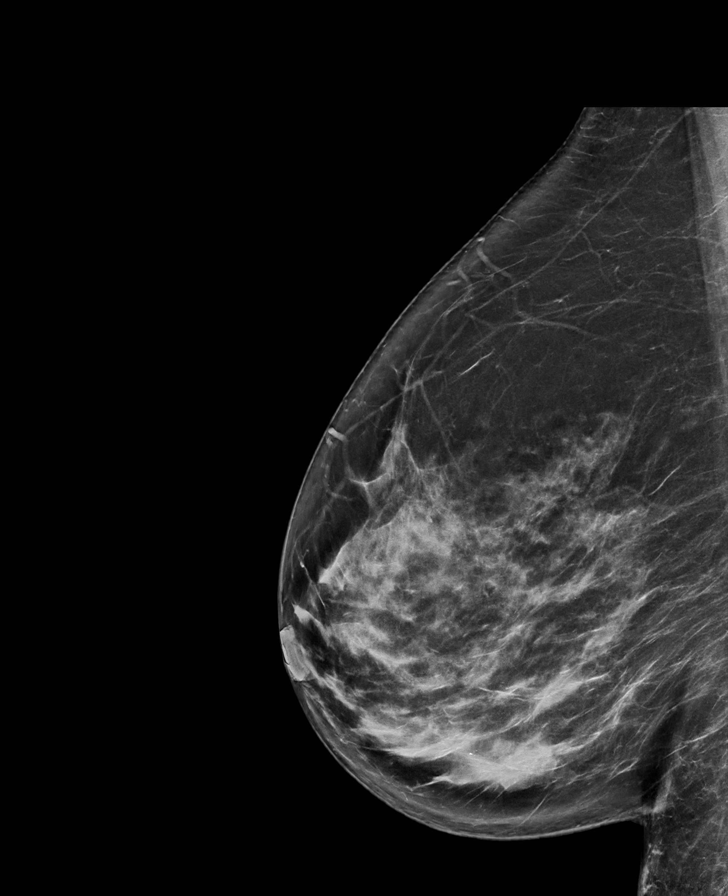

[L CC synth-2D]
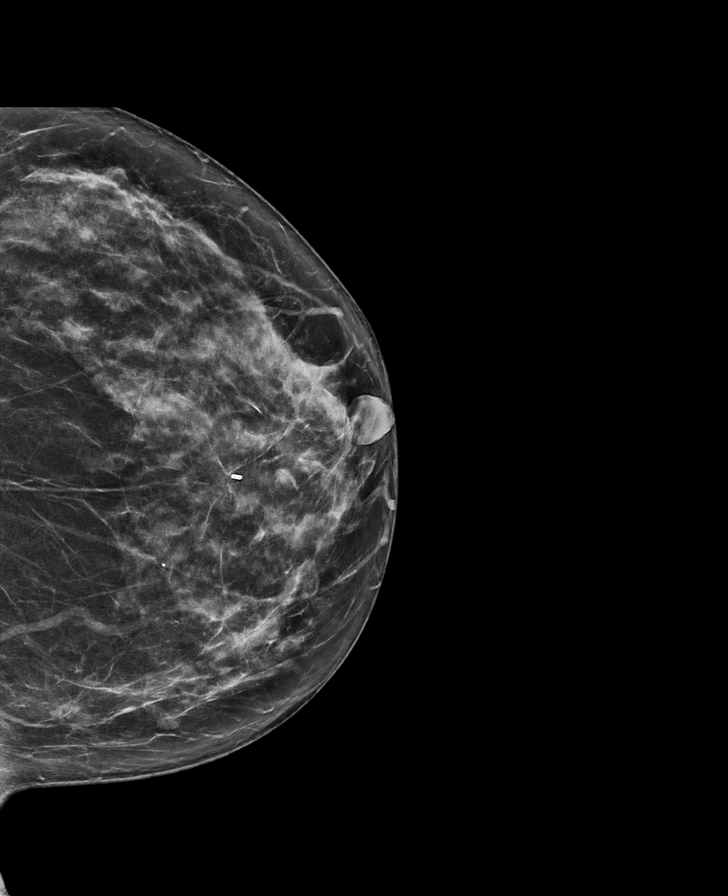

[L CC tomo · tomo slice 38/75.0]
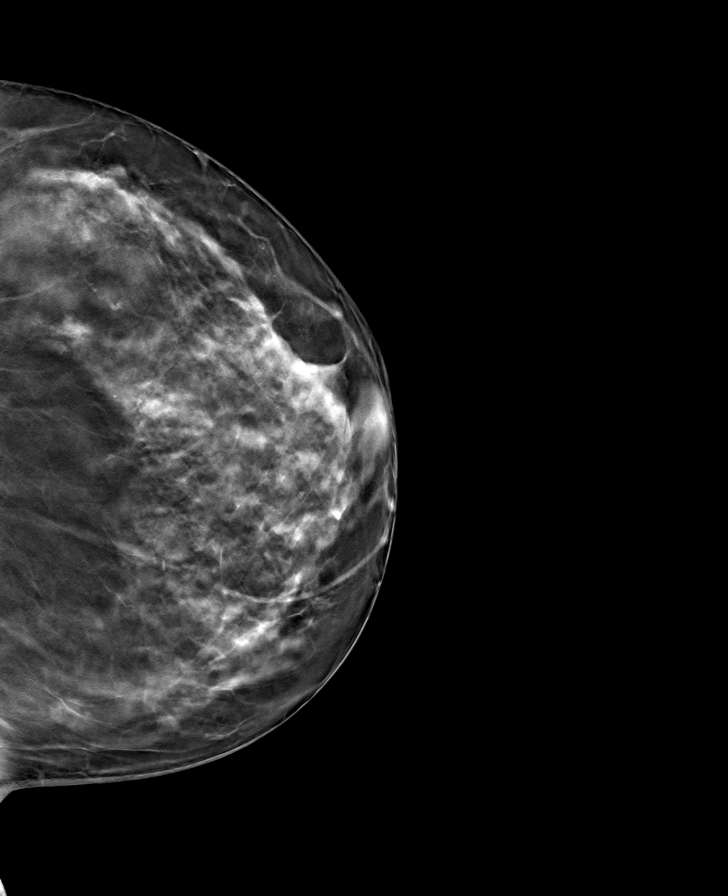

[L MLO tomo · tomo slice 46/91.0]
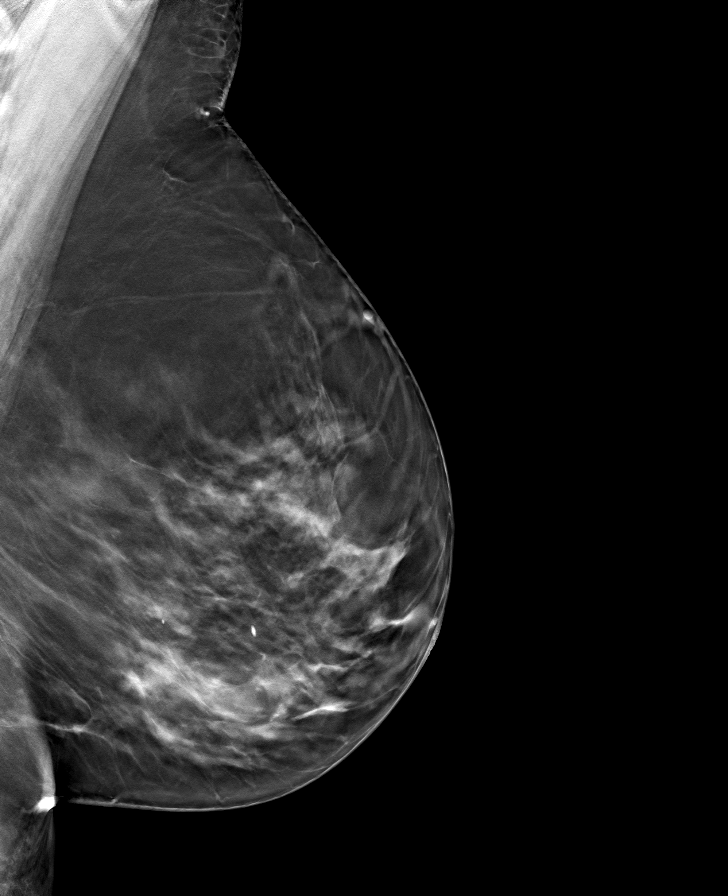

[R CC tomo · tomo slice 41/81.0]
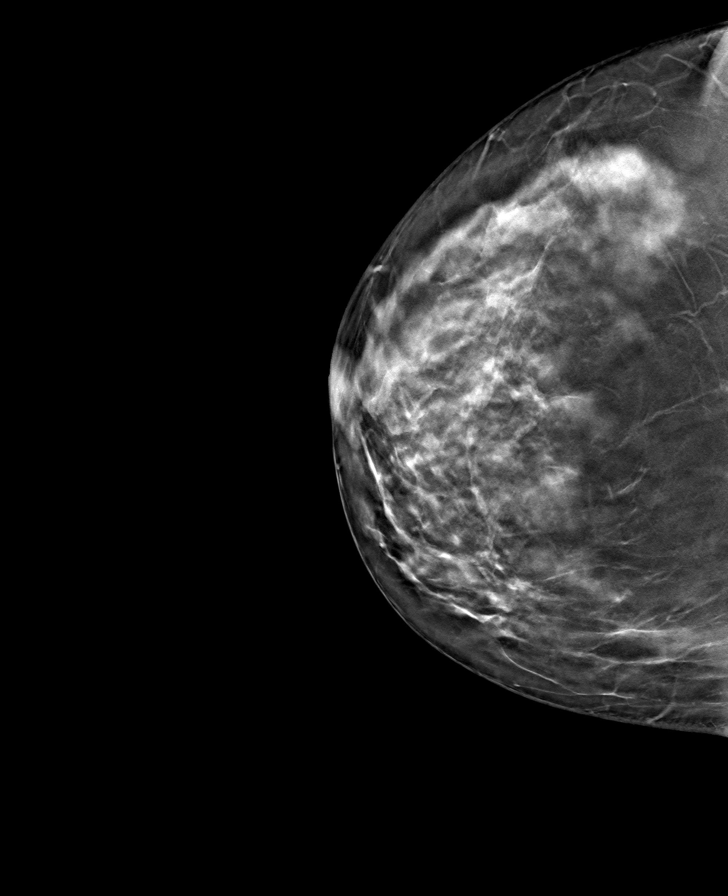

[R MLO tomo · tomo slice 42/83.0]
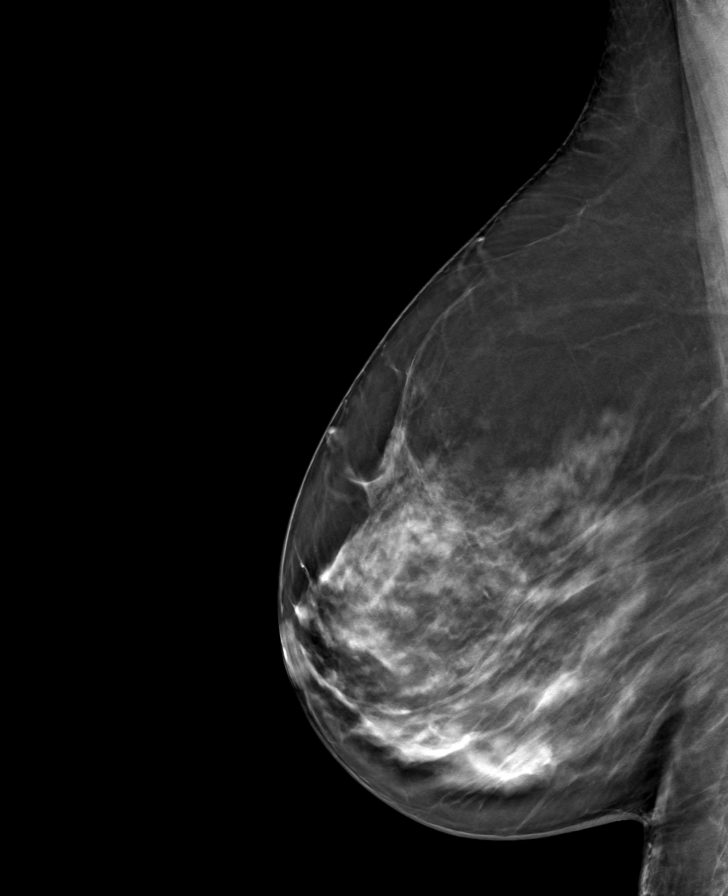

[8 of 24 positions shown; findings below may reference images not displayed]

ACR Breast Density Category c: The breast tissue is heterogeneously
dense, which may obscure small masses.
FINDINGS: There are no findings suspicious for malignancy. Images were
processed with CAD.
IMPRESSION: No mammographic evidence of malignancy. A result letter of this
screening mammogram will be mailed directly to the patient.

RECOMMENDATION:
Screening mammogram in one year. (Code:FT-U-LHB)

BI-RADS CATEGORY  1: Negative.

## 2019-06-08 ENCOUNTER — Telehealth: Payer: Self-pay

## 2019-06-08 ENCOUNTER — Ambulatory Visit: Payer: 59 | Admitting: Physical Therapy

## 2019-06-08 ENCOUNTER — Other Ambulatory Visit: Payer: Self-pay

## 2019-06-08 DIAGNOSIS — R2681 Unsteadiness on feet: Secondary | ICD-10-CM | POA: Diagnosis not present

## 2019-06-08 DIAGNOSIS — R2689 Other abnormalities of gait and mobility: Secondary | ICD-10-CM

## 2019-06-08 DIAGNOSIS — F411 Generalized anxiety disorder: Secondary | ICD-10-CM

## 2019-06-08 MED ORDER — ALPRAZOLAM 0.5 MG PO TABS: 0.5000 mg | ORAL_TABLET | ORAL | 0 refills | Status: AC

## 2019-06-08 NOTE — Telephone Encounter (Signed)
Spoke to patient - reports she is going to get a procedure done and needs xanax since she is having panic attacks. Discussed not to combine with Temazepam. She will only use to for the procedure that she is going to have.

## 2019-06-08 NOTE — Therapy (Signed)
[place] MAIN [facility] SERVICES [address] [place], [place], [address] Phone: [phone]   Fax:  [phone]  Physical Therapy Treatment  Patient Details  Name: Kristine [name] MRN: [id number] Date of Birth: Mar 15, 1971 Referring Provider (PT): hemang shah    Encounter Date: 06/08/2019  PT End of Session - 06/08/19 [place]    Visit Number  4    Number of Visits  16    Date for PT Re-Evaluation  07/13/19    Authorization Type  4/10 eval start 05/18/19    PT Start Time  1645    PT Stop Time  1810    PT Time Calculation (min)  85 min    Equipment Utilized During Treatment  Gait belt   during stair training   Activity Tolerance  Patient tolerated treatment well;Patient limited by pain    Behavior During Therapy  WFL for tasks assessed/performed       Past Medical History:  Diagnosis Date  . Abnormal weight gain 08/05/2013  . Absence of interventricular septum 08/05/2013  . Allergic rhinitis 04/17/2016  . Allergic urticaria due to ingested food 02/09/2016  . Anxiety   . Asthma   . Clinical depression 09/30/2013  . Complication of anesthesia    PT HAD TROUBLE BREATHING AFTER HYSTERECTOMY IN OCTOBER  . Depression   . Dizziness   . Excess weight 09/30/2013  . GERD (gastroesophageal reflux disease)   . Headache, migraine 09/30/2013  . Heart murmur   . Heavy drinker 04/11/2014   Overview:  Patient reports drinking 2-3 bottles of wine each weekend.    . History of methicillin resistant staphylococcus aureus (MRSA) 11/2015  . Infection of urinary tract 09/30/2013  . Irregular bleeding 09/30/2013  . Multiple allergies   . VSD (ventricular septal defect and aortic arch hypoplasia     Past Surgical History:  Procedure Laterality Date  . BOTOX INJECTION N/A 10/23/2016   Procedure: BOTOX INJECTION;  Surgeon: Royston Cowper, MD;  Location: [facility] ORS;  Service: Urology;  Laterality: N/A;  . BREAST BIOPSY Left 11/13/13   benign  . CESAREAN SECTION     . COLONOSCOPY    . CORONARY ANGIOPLASTY    . CYSTOSCOPY N/A 10/23/2016   Procedure: CYSTOSCOPY;  Surgeon: Royston Cowper, MD;  Location: [facility] ORS;  Service: Urology;  Laterality: N/A;  . CYSTOSCOPY WITH HYDRODISTENSION AND BIOPSY    . LAPAROSCOPIC ASSISTED VAGINAL HYSTERECTOMY N/A 08/20/2016   Procedure: LAPAROSCOPIC ASSISTED VAGINAL HYSTERECTOMY;  Surgeon: Boykin Nearing, MD;  Location: [facility] ORS;  Service: Gynecology;  Laterality: N/A;  . LAPAROSCOPIC BILATERAL SALPINGECTOMY Bilateral 08/20/2016   Procedure: LAPAROSCOPIC BILATERAL SALPINGECTOMY;  Surgeon: Boykin Nearing, MD;  Location: [facility] ORS;  Service: Gynecology;  Laterality: Bilateral;  . NASAL SINUS SURGERY     x12    There were no vitals filed for this visit.  Subjective Assessment - 06/08/19 1648    Subjective  Pt tolerated last week's session without problems.  Pt has no question s about her HEP.  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 Pt feels she is going to fall forward when walking downstairs and when carrying objects down from upstairs.    Pertinent History  Patient is a pleasant 48 year old female who presents for balance training/polyneuropathy. PMH includes VSD(ventricular septal defect), allergic rhinitis, UTI, depression, migraines, polyneuropathy, bilateral carpal tunnel syndrome, ADD, anxiety, basal cell carcinoma, fibroids, GERD,  and two children by C section.Saw PT in September 2019 at a different facility for plantar fasciitis but reports they were not able to help.  Patient frequently loses balance but does not fall to the ground as she is able to catch herself. She cannot squat or lunge now without losing balance and wants to be able to return to these activities. 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    Limitations  Sitting;Lifting;Standing;Walking;House hold activities;Other (comment)    How long can you sit comfortably?   limited by ADHD not by pain    How long can you stand comfortably?  stands all day for work: hairdresser    How long can you walk comfortably?  hurts her feet to walk immediately    Patient Stated Goals  to be able to do a squat or lunge without falling over.    Pain Onset  More than a month ago         [facility] PT Assessment - 06/08/19 1720      Observation/Other Assessments   Observations  increased upper lumbar lordosis,     Simulated positions with haircutting at work: wide squat, knees adducted      Palpation   Spinal mobility  ~10 L rotation due to pain, ~15% R rotation, ~20% L sideflexion with radiating pain down leg   ( post Tx: B rotation ~25% with centralizing of pain to posterior knees, Sideflexion B ~25% with cue for decreased lordosis).    Increased L paraspinal mm tensions T-spine to sacrum   ( Post Tx: decreased tightness)        SI assessment   L iliac crest higher than R, R shoulder higher than L, slight L thoracic convex curve .   ( post Tx, shoulder levelled, spinal alignment)    Palpation comment  supine, R iliac crest higher, R malleolus higher    ( Post Tx: levelled ASIS/ and malleoli, no rotation to ASIS)         Ambulation/Gait   Stair Management Technique  One rail Right;Alternating pattern   poor eccentric control    Gait Comments  heel striking, minimal trunk rotation/ arm swings, increased lumbar lordosis,   ( post TX: with manual Tx and cues: decreased lordosis, increased trunk rotation,  reported no  radiating pain in BLE   )                    [facility] Adult PT Treatment/Exercise - 06/08/19 1720      Neuro Re-ed    Neuro Re-ed Details   cued for decreased lumbar lordosis with sideflexion/ rotation      Modalities   Modalities  Moist Heat      Moist Heat Therapy   Number Minutes Moist Heat  5 Minutes    Moist Heat Location  --   back ( unbilled) legs elevated on stool, educated this for post work position to increase leg  circulating after standing on feet all day at hairdresser      Manual Therapy   Manual therapy comments  AP mob at L SIJ to increase posterior rotation of  iliac L, STM/ MWM along L paraspinal                PT Short Term Goals - 05/18/19 1739      PT SHORT TERM GOAL #1   Title  Patient will be independent in home exercise program to improve strength/mobility for better functional  independence with ADLs.    Baseline  7/6: HEP given    Time  2    Period  Weeks    Status  New    Target Date  06/01/19        PT Long Term Goals - 06/08/19 1835      PT LONG TERM GOAL #1   Title  Patient will increase Berg Balance score by > 6 points (46/56) to demonstrate decreased fall risk during functional activities.    Baseline  7/6: 40/56    Time  8    Period  Weeks    Status  On-going      PT LONG TERM GOAL #2   Title  Patient will increase ABC scale score >80% to demonstrate better functional mobility and better confidence with ADLs.    Baseline  7/6: 40/56    Time  8    Period  Weeks    Status  On-going      PT LONG TERM GOAL #3   Title  Patient will perform a squat and/or a lunge without using UE assistance or losing balance to return to previous exercise program.    Baseline  7/6: loses balance upon attempt    Time  8    Period  Weeks    Status  On-going      PT LONG TERM GOAL #4   Title  Patient will be independent in walking on even/uneven surface with least restrictive assistive device, for 20+ minutes without rest break, reporting some difficulty or less to improve walking tolerance with community ambulation including grocery shopping, going to church,etc.    Baseline  7/6: unable to walk for long durations without severe pain    Time  8    Period  Weeks    Status  On-going            Plan - 06/08/19 1837    Clinical Impression Statement Provided education and demonstration for optimizing safety and postural stability in specific functional activities that  that pt reported having difficulty with:  _ descending stairs with step to pattern, body position turned 45 deg to rail _education to place objects into backpack to wear while descending stairs to free up hands for support on rail and wall Pt demo'd proper technique post training. Anticipate these modifications will help pt gain more confidence with balance and minimize risk for falls.    Assessment today showed spinal deviations (significant upper lumbar lordosis,  R shoulder higher, L iliac crest higher in standing, increased L paraspinal tightness) and significantly limited sideflexion/ rotation, limited trunk rotation with gait. These deviations produced with radicular Sx. Following manual Tx which pt tolerated without increased pain, pt demo'd increased spinal mobility, equal alignment pelvic girdle and shoulders, and increased propioception to decrease lumbar lordosis. These structural improvements helped to centralize B radicular pain. Pt showed improved trunk rotation/ arm swing and reported no radicular pain bilaterally with gait post Tx. Provided standing modifications and stretches to perform at work to minimize overuse of mm tightness/ spinal deviations. Pt will benefit from further deep core/ thoracolumbar strengthening to minimize excessive upper lumbar lordosis/ B radicular pain and benefit from further propioception training for feet placement to minimize risks for falls. Session was extended today  to make up for pt cancelling her second session this week.  Pt continues to benefit from skilled PT.      Personal Factors and Comorbidities  Comorbidity 3+;Fitness;Past/Current Experience;Profession;Social Background;Time since onset of injury/illness/exacerbation  Comorbidities  cervical cancer, bilateral carpal tunnel syndrome, polyneuropathy, depression, migraines, VSD, ADD, anxiety, GERD    Examination-Activity Limitations  Bend;Caring for Others;Carry;Dressing;Lift;Locomotion Level;Reach  Overhead;Squat;Stairs;Stand;Toileting;Transfers    Examination-Participation Restrictions  Church;Cleaning;Community Activity;Driving;Interpersonal Relationship;Laundry;Shop;Volunteer;Yard Work    [employer] officer  Evolving/Moderate complexity    Rehab Potential  Fair    PT Frequency  2x / week    PT Duration  8 weeks    PT Treatment/Interventions  ADLs/Self Care Home Management;Cryotherapy;Electrical Stimulation;Iontophoresis 4mg /ml Dexamethasone;Moist Heat;Traction;Ultrasound;DME Instruction;Gait training;Stair training;Functional mobility training;Therapeutic activities;Therapeutic exercise;Balance training;Neuromuscular re-education;Manual techniques;Passive range of motion;Dry needling;Vestibular;Taping;Splinting;Energy conservation;Patient/family education    PT Next Visit Plan  nerve glides, airex pad, coordination, hamstring stretch    PT Home Exercise Plan  see above    Consulted and Agree with Plan of Care  Patient       Patient will benefit from skilled therapeutic intervention in order to improve the following deficits and impairments:  Abnormal gait, Decreased activity tolerance, Decreased balance, Decreased endurance, Decreased coordination, Decreased mobility, Difficulty walking, Decreased strength, Impaired flexibility, Impaired perceived functional ability, Impaired sensation, Postural dysfunction, Improper body mechanics, Pain  Visit Diagnosis: 1. Unsteadiness on feet   2. Other abnormalities of gait and mobility        Problem List Patient Active Problem List   Diagnosis Date Noted  . Bilateral carpal tunnel syndrome 08/27/2018  . Intractable migraine with aura without status migrainosus 08/27/2018  . Polyneuropathy 08/27/2018  . Cervical cancer ([place]) 08/18/2018  . Chest pain 09/23/2017  . Elevated TSH 07/03/2017  . Menorrhagia with irregular cycle 09/17/2016  . Chronic idiopathic urticaria 09/03/2016  . Postoperative state 08/20/2016  .  Calcaneal spur 07/10/2016  . On prednisone therapy 06/20/2016  . Allergic rhinitis 04/17/2016  . Allergic urticaria due to ingested food 02/09/2016  . Heavy drinker 04/11/2014  . Depression 09/30/2013  . Irregular bleeding 09/30/2013  . Migraine headache 09/30/2013  . Excess weight 09/30/2013  . Frequent UTI 09/30/2013  . Infection of urinary tract 09/30/2013  . VSD (ventricular septal defect and aortic arch hypoplasia 08/05/2013  . Abnormal weight gain 08/05/2013    Jerl Mina ,PT, DPT, E-RYT  06/08/2019, 6:37 PM  [place] MAIN [facility] SERVICES [address] [place], [place], [address] Phone: [phone]   Fax:  [phone]  Name: Kristine [name] MRN: [id number] Date of Birth: [birth year]

## 2019-06-08 NOTE — Telephone Encounter (Signed)
Received fax requesting a refill   ALPRAZolam Duanne Moron) 0.5 MG tablet Medication Date: 08/25/2018 Department: [facility] Ordering/Authorizing: Ursula Alert, MD  Order Providers  Prescribing Provider Encounter Provider  Ursula Alert, MD Ursula Alert, MD  Outpatient Medication Detail   Disp Refills Start End   ALPRAZolam Duanne Moron) 0.5 MG tablet 10 tablet 0 08/25/2018    Sig - Route: Take 1 tablet (0.5 mg total) by mouth as directed. Take it only for severe panic symptoms - Oral   Sent to pharmacy as: ALPRAZolam Duanne Moron) 0.5 MG tablet   E-Prescribing Status: Receipt confirmed by pharmacy (08/25/2018 8:55 AM EDT)   Associated Diagnoses  GAD (generalized anxiety disorder) - [place], [place]

## 2019-06-08 NOTE — Telephone Encounter (Signed)
pt called said that she has no more xanax left and that she needs a refill pt states she having bad panic attacks.

## 2019-06-08 NOTE — Patient Instructions (Signed)
Standing at work with cutting hair   Ski track stance with goldilock length and width  Toes, knees, hips aligned at 45 deg angle  Equal weight on both   Mini squat  Toes, knees, hips aligned   Stairs  Going down: turn body and feet 45 deg to rail  Wear a back pack to carry laptop. [place] to free hands to place on wall and rail as you go down    Stretches at work:  Equal weight in both feet, length spine, side bend and rotation ~25-30%  ( 5 reps each direction every 2 clients)   Stretching with hands at counter to lengthen spine , knees bents  ( at home: childs pose rocking

## 2019-06-08 NOTE — Telephone Encounter (Signed)
ALPRAZolam Duanne Moron) 0.5 MG tablet Medication Date: 08/25/2018 Department: [facility] Ordering/Authorizing: Ursula Alert, MD  Order Providers  Prescribing Provider Encounter Provider  Ursula Alert, MD Ursula Alert, MD  Outpatient Medication Detail   Disp Refills Start End   ALPRAZolam Duanne Moron) 0.5 MG tablet 10 tablet 0 08/25/2018    Sig - Route: Take 1 tablet (0.5 mg total) by mouth as directed. Take it only for severe panic symptoms - Oral   Sent to pharmacy as: ALPRAZolam Duanne Moron) 0.5 MG tablet   E-Prescribing Status: Receipt confirmed by pharmacy (08/25/2018 8:55 AM EDT)   Associated Diagnoses  GAD (generalized anxiety disorder) - [place]      Received fax that patient needs refills on .

## 2019-06-10 ENCOUNTER — Ambulatory Visit: Payer: 59

## 2019-06-15 ENCOUNTER — Ambulatory Visit: Payer: 59

## 2019-06-17 ENCOUNTER — Ambulatory Visit: Payer: 59

## 2019-06-22 ENCOUNTER — Other Ambulatory Visit: Payer: Self-pay

## 2019-06-22 ENCOUNTER — Encounter: Payer: Self-pay | Admitting: Psychiatry

## 2019-06-22 ENCOUNTER — Ambulatory Visit: Payer: 59 | Attending: Neurology

## 2019-06-22 ENCOUNTER — Ambulatory Visit (INDEPENDENT_AMBULATORY_CARE_PROVIDER_SITE_OTHER): Payer: 59 | Admitting: Psychiatry

## 2019-06-22 DIAGNOSIS — F40298 Other specified phobia: Secondary | ICD-10-CM | POA: Insufficient documentation

## 2019-06-22 DIAGNOSIS — R2689 Other abnormalities of gait and mobility: Secondary | ICD-10-CM | POA: Insufficient documentation

## 2019-06-22 DIAGNOSIS — G4701 Insomnia due to medical condition: Secondary | ICD-10-CM | POA: Diagnosis not present

## 2019-06-22 DIAGNOSIS — F411 Generalized anxiety disorder: Secondary | ICD-10-CM

## 2019-06-22 DIAGNOSIS — R2681 Unsteadiness on feet: Secondary | ICD-10-CM | POA: Insufficient documentation

## 2019-06-22 MED ORDER — DESVENLAFAXINE SUCCINATE ER 100 MG PO TB24: 100.0000 mg | ORAL_TABLET | Freq: Every day | ORAL | 1 refills | Status: DC

## 2019-06-22 MED ORDER — BUSPIRONE HCL 5 MG PO TABS: 5.0000 mg | ORAL_TABLET | Freq: Every day | ORAL | 1 refills | Status: DC

## 2019-06-22 NOTE — Progress Notes (Signed)
Virtual Visit via Video Note  I connected with Kristine [name] on 06/22/19 at 10:00 AM EDT by a video enabled telemedicine application and verified that I am speaking with the correct person using two identifiers.   I discussed the limitations of evaluation and management by telemedicine and the availability of in person appointments. The patient expressed understanding and agreed to proceed.   I discussed the assessment and treatment plan with the patient. The patient was provided an opportunity to ask questions and all were answered. The patient agreed with the plan and demonstrated an understanding of the instructions.   The patient was advised to call back or seek an in-person evaluation if the symptoms worsen or if the condition fails to improve as anticipated.   [facility] MD OP Progress Note  06/22/2019 3:49 PM Kristine [name]  MRN:  [id number]  Chief Complaint:  Chief Complaint    Follow-up     HPI: Kristine [name] is a 48 year old female, married, employed, lives in [place], has a history of GAD, insomnia, needle phobia, VSD, TSH abnormalities, GERD was evaluated by telemedicine today.  Patient reports she is currently at the beach on [place] 1 week vacation.  She reports she is having fun.  She reports her anxiety symptoms continues to be okay on the current medication regimen.  She is compliant on her Pristiq.  She also takes low dosage of BuSpar 5 mg.  She reports this combination is effective at this time.  She reports she does struggle with sleep on and off.  She however does not want to take temazepam every night and has been using it only as needed.  She reports she does not want any additional sleep medications or dosage increase at this time.  Patient reports she does continue to struggle with some pain.  She reports she got a procedure done for migraine headaches recently.  Ever since that she has been noticing more and more neck and shoulder pain.  She is currently working with PT.   She currently thinks her headaches were more so due to her neck and shoulder joint problems.  She does struggle with neuropathy.  She does not tolerate a lot of medications and hence is trying to get physical therapy and massage therapy.  Patient denies any suicidality, homicidality or perceptual disturbances.   Visit Diagnosis:    ICD-10-CM   1. GAD (generalized anxiety disorder)  F41.1 busPIRone (BUSPAR) 5 MG tablet    desvenlafaxine (PRISTIQ) 100 MG 24 hr tablet  2. Insomnia due to medical condition  G47.01   3. Needle phobia  F40.298     Past Psychiatric History: I have reviewed past psychiatric history from my progress note on 12/12/2018.  Past Medical History:  Past Medical History:  Diagnosis Date  . Abnormal weight gain 08/05/2013  . Absence of interventricular septum 08/05/2013  . Allergic rhinitis 04/17/2016  . Allergic urticaria due to ingested food 02/09/2016  . Anxiety   . Asthma   . Clinical depression 09/30/2013  . Complication of anesthesia    PT HAD TROUBLE BREATHING AFTER HYSTERECTOMY IN OCTOBER  . Depression   . Dizziness   . Excess weight 09/30/2013  . GERD (gastroesophageal reflux disease)   . Headache, migraine 09/30/2013  . Heart murmur   . Heavy drinker 04/11/2014   Overview:  Patient reports drinking 2-3 bottles of wine each weekend.    . History of methicillin resistant staphylococcus aureus (MRSA) 11/2015  . Infection of urinary tract 09/30/2013  .  Irregular bleeding 09/30/2013  . Multiple allergies   . VSD (ventricular septal defect and aortic arch hypoplasia     Past Surgical History:  Procedure Laterality Date  . BOTOX INJECTION N/A 10/23/2016   Procedure: BOTOX INJECTION;  Surgeon: Royston Cowper, MD;  Location: [facility];  Service: Urology;  Laterality: N/A;  . BREAST BIOPSY Left 11/13/13   benign  . CESAREAN SECTION    . COLONOSCOPY    . CORONARY ANGIOPLASTY    . CYSTOSCOPY N/A 10/23/2016   Procedure: CYSTOSCOPY;  Surgeon: Royston Cowper,  MD;  Location: [facility];  Service: Urology;  Laterality: N/A;  . CYSTOSCOPY WITH HYDRODISTENSION AND BIOPSY    . LAPAROSCOPIC ASSISTED VAGINAL HYSTERECTOMY N/A 08/20/2016   Procedure: LAPAROSCOPIC ASSISTED VAGINAL HYSTERECTOMY;  Surgeon: Boykin Nearing, MD;  Location: [facility];  Service: Gynecology;  Laterality: N/A;  . LAPAROSCOPIC BILATERAL SALPINGECTOMY Bilateral 08/20/2016   Procedure: LAPAROSCOPIC BILATERAL SALPINGECTOMY;  Surgeon: Boykin Nearing, MD;  Location: [facility];  Service: Gynecology;  Laterality: Bilateral;  . NASAL SINUS SURGERY     x12    Family Psychiatric History: I have reviewed family psychiatric history from my progress note on 12/12/2018.  Family History:  Family History  Problem Relation Age of Onset  . Breast cancer Maternal Aunt   . Breast cancer Paternal Aunt   . Breast cancer Maternal Grandmother   . Breast cancer Paternal Grandmother   . Breast cancer Paternal Aunt   . Bipolar disorder Paternal Aunt   . Kidney cancer Maternal Uncle   . Prostate cancer Paternal Grandfather   . Kidney disease Unknown        father's side  . Aneurysm Mother   . Liver disease Mother   . Drug abuse Mother   . Alcohol abuse Mother   . Anxiety disorder Mother   . Depression Mother   . Cancer Father   . Anxiety disorder Father   . Depression Father     Social History: I have reviewed social history from my progress note on 12/12/2018. Social History   Socioeconomic History  . Marital status: Married    Spouse name: greg   . Number of children: 2  . Years of education: Not on file  . Highest education level: Associate degree: occupational, Hotel manager, or vocational program  Occupational History  . Not on file  Social Needs  . Financial resource strain: Not hard at all  . Food insecurity    Worry: Never true    Inability: Never true  . Transportation needs    Medical: No    Non-medical: No  Tobacco Use  . Smoking status: Former Smoker    Types:  Cigarettes    Quit date: 11/12/2006    Years since quitting: 12.6  . Smokeless tobacco: Never Used  . Tobacco comment: quit 2008  Substance and Sexual Activity  . Alcohol use: No    Alcohol/week: 0.0 standard drinks  . Drug use: No  . Sexual activity: Yes    Birth control/protection: None  Lifestyle  . Physical activity    Days per week: 0 days    Minutes per session: 0 min  . Stress: Very much  Relationships  . Social Herbalist on phone: Not on file    Gets together: Not on file    Attends religious service: More than 4 times per year    Active member of club or organization: No    Attends meetings of clubs or  organizations: Never    Relationship status: Married  Other Topics Concern  . Not on file  Social History Narrative  . Not on file    Allergies:  Allergies  Allergen Reactions  . Bee Pollen Anaphylaxis  . Bee Venom Anaphylaxis  . Eggs Or Egg-Derived Products Anaphylaxis  . Keflex [Cephalexin] Anaphylaxis  . Lac Bovis Anaphylaxis  . Lactase Anaphylaxis and Other (See Comments)  . Milk-Related Compounds Anaphylaxis  . Penicillins Anaphylaxis    Has patient had a PCN reaction causing immediate rash, facial/tongue/throat swelling, SOB or lightheadedness with hypotension: No Has patient had a PCN reaction causing severe rash involving mucus membranes or skin necrosis: No Has patient had a PCN reaction that required hospitalization No Has patient had a PCN reaction occurring within the last 10 years: Yes If all of the above answers are "NO", then may proceed with Cephalosporin use.   . Shellfish Allergy Anaphylaxis and Other (See Comments)  . Soy Allergy Anaphylaxis  . Wheat Bran Anaphylaxis  . Codeine     Other reaction(s): RASH  . Ferrous Gluconate Diarrhea    And vomiting And vomiting  . Hydrocodone Hives  . Iron Diarrhea  . Iodine Rash  . Latex Itching and Rash    Metabolic Disorder Labs: No results found for: HGBA1C, MPG No results found  for: PROLACTIN No results found for: CHOL, TRIG, HDL, CHOLHDL, VLDL, LDLCALC Lab Results  Component Value Date   TSH 5.752 (H) 09/19/2017   TSH 1.190 09/04/2016    Therapeutic Level Labs: No results found for: LITHIUM No results found for: VALPROATE No components found for:  CBMZ  Current Medications: Current Outpatient Medications  Medication Sig Dispense Refill  . albuterol (PROVENTIL HFA;VENTOLIN HFA) 108 (90 Base) MCG/ACT inhaler Inhale 2 puffs into the lungs every 6 (six) hours as needed for wheezing or shortness of breath. 1 Inhaler 2  . ALPRAZolam (XANAX) 0.5 MG tablet Take 1 tablet (0.5 mg total) by mouth as directed. Take it only once a day as needed for severe panic symptoms 10 tablet 0  . azelastine (ASTELIN) 0.1 % nasal spray     . azelastine (OPTIVAR) 0.05 % ophthalmic solution Place 1 drop into both eyes daily.    . Azelastine HCl 0.15 % SOLN Place 2 sprays into the nose 2 (two) times daily.     . Biotin 10000 MCG TABS Take 10,000 mg by mouth daily.    . busPIRone (BUSPAR) 5 MG tablet Take 1 tablet (5 mg total) by mouth daily. 90 tablet 1  . candesartan (ATACAND) 8 MG tablet Take by mouth.    . cetirizine (ZYRTEC) 10 MG tablet Take 2 tablets by mouth 2 (two) times daily.     . Cholecalciferol (VITAMIN D-1000 MAX ST) 1000 units tablet Take 1 tablet by mouth 2 (two) times daily.     Marland Kitchen CRANBERRY PO Take 1 capsule by mouth at bedtime.     . cyanocobalamin (,VITAMIN B-12,) 1000 MCG/ML injection INJECT ONE ML INTO THE MUSCLE ONCE WEEKLY FOR FOUR WEEKS. THEN INJECT 1ML INTO THE MUSCLE ONCE MONTHY FOR FOUR MONTHS    . desvenlafaxine (PRISTIQ) 100 MG 24 hr tablet Take 1 tablet (100 mg total) by mouth daily. 90 tablet 1  . doxycycline (MONODOX) 100 MG capsule Take 100 mg by mouth 2 (two) times daily.  0  . EPINEPHrine 0.3 mg/0.3 mL IJ SOAJ injection Inject 0.3 mLs (0.3 mg total) into the muscle once. 1 Device 1  . erythromycin ophthalmic ointment  Use 1/4" ribbon to affected eye &  sutures if present 2x/day for 14 days. If you develop allergy symptoms, stop & call our office.    . fluticasone (FLONASE) 50 MCG/ACT nasal spray Place 1 spray into both nostrils 2 (two) times daily.     . fluticasone (FLOVENT HFA) 220 MCG/ACT inhaler INHALE 2 PUFFS BY MOUTH TWICE DAILY    . Fluticasone-Salmeterol (ADVAIR) 250-50 MCG/DOSE AEPB Inhale 1 puff into the lungs 2 (two) times daily.    . Galcanezumab-gnlm 120 MG/ML SOAJ Inject into the skin.    . hydrochlorothiazide (HYDRODIURIL) 25 MG tablet Take 25 mg by mouth daily.    . hydrOXYzine (ATARAX/VISTARIL) 50 MG tablet hydroxyzine HCl 50 mg tablet    . Insulin Pen Needle (FIFTY50 PEN NEEDLES) 32G X 4 MM MISC by Does not apply route.    Marland Kitchen levothyroxine (SYNTHROID, LEVOTHROID) 50 MCG tablet Take 50 mcg daily, any generic manufacturer is okay but try to stay with same manufacturer as much as possible    . Liraglutide -Weight Management (SAXENDA) 18 MG/3ML SOPN Inject into the skin.    . magnesium oxide (MAG-OX) 400 MG tablet Take 400 mg by mouth daily.    . methylPREDNISolone (MEDROL DOSEPAK) 4 MG TBPK tablet     . metoCLOPramide (REGLAN) 5 MG tablet TAKE 1 TABLET BY MOUTH EVERY 6 HOURS AS NEEDED FOR MIGRAINE HEADACHE  1  . montelukast (SINGULAIR) 10 MG tablet Take 10 mg by mouth at bedtime.     . Multiple Vitamins-Minerals (ZINC PO) Take 1 tablet by mouth daily.    . nitrofurantoin, macrocrystal-monohydrate, (MACROBID) 100 MG capsule Take by mouth.    Marland Kitchen ofloxacin (FLOXIN) 0.3 % OTIC solution ofloxacin 0.3 % ear drops    . omalizumab (XOLAIR) 150 MG injection INJECT 300MG  SUBCUTANEOUSLY EVERY 4 WEEKS (GIVEN AT PRESCRIBERS OFFICE)    . ranitidine (ZANTAC) 150 MG tablet Take 150 mg by mouth as directed. Takes 1 in the morning 1 before each meal and 1 before bedtime    . RELION PEN NEEDLES 29G X 12MM MISC USE 1 PEN NEEDLE ONCE DAILY AS DIRECTED    . SAXENDA 18 MG/3ML SOPN     . temazepam (RESTORIL) 7.5 MG capsule Take 1 capsule (7.5 mg total)  by mouth at bedtime as needed for sleep. 30 capsule 2  . vitamin B-12 (CYANOCOBALAMIN) 1000 MCG tablet Take 1,000 mcg by mouth daily.    . vitamin C (ASCORBIC ACID) 500 MG tablet Take 500 mg by mouth at bedtime.     No current facility-administered medications for this visit.      Musculoskeletal: Strength & Muscle Tone: UTA Gait & Station: normal Patient leans: N/A  Psychiatric Specialty Exam: Review of Systems  Musculoskeletal: Positive for neck pain.  Psychiatric/Behavioral: The patient is nervous/anxious.   All other systems reviewed and are negative.   Last menstrual period 07/17/2016.There is no height or weight on file to calculate BMI.  General Appearance: Casual  Eye Contact:  Fair  Speech:  Clear and Coherent  Volume:  Normal  Mood:  Anxious  Affect:  Appropriate  Thought Process:  Goal Directed and Descriptions of Associations: Intact  Orientation:  Full (Time, Place, and Person)  Thought Content: Logical   Suicidal Thoughts:  No  Homicidal Thoughts:  No  Memory:  Immediate;   Fair Recent;   Fair Remote;   Fair  Judgement:  Fair  Insight:  Fair  Psychomotor Activity:  Normal  Concentration:  Concentration: Fair and  Attention Span: Fair  Recall:  [employer] of Knowledge: Fair  Language: Fair  Akathisia:  No  Handed:  Right  AIMS (if indicated): Denies tremors, rigidity  Assets:  Communication Skills Desire for Improvement Social Support  ADL's:  Intact  Cognition: WNL  Sleep:  Fair   Screenings:   Assessment and Plan: Kristine [name] is a 48 year old female, has a history of anxiety, multiple medical problems as noted above was evaluated by telemedicine today.  Patient continues to struggle with multiple health issues including pain.  She however reports her anxiety symptoms are stable on the current medication regimen.  Plan For GAD-stable Pristiq 100 mg p.o. daily Continue CBT BuSpar 5 mg p.o. daily-reduced dosage  For insomnia-improving Temazepam  7.5 mg p.o. nightly as needed   For needle phobia-unstable-chronic Continue CBT as needed.  Follow-up in clinic in 2 to 3 months or sooner if needed.  Appointment scheduled for October 19 at 10 AM  I have spent atleast 15 minutes non face to face with patient today. More than 50 % of the time was spent for psychoeducation and supportive psychotherapy and care coordination.  This note was generated in part or whole with voice recognition software. Voice recognition is usually quite accurate but there are transcription errors that can and very often do occur. I apologize for any typographical errors that were not detected and corrected.        Ursula Alert, MD 06/22/2019, 3:49 PM

## 2019-06-22 NOTE — Therapy (Signed)
[place] MAIN [facility] SERVICES [address] [place], [place], [address] Phone: [phone]   Fax:  [phone]  Physical Therapy Treatment  Patient Details  Name: Kristine [name] MRN: [id number] Date of Birth: [birth year] Referring Provider (PT): hemang shah    Encounter Date: 06/22/2019  PT End of Session - 06/22/19 1740    Visit Number  5    Number of Visits  16    Date for PT Re-Evaluation  07/13/19    Authorization Type  5/10 eval start 05/18/19    PT Start Time  1645    PT Stop Time  1726    PT Time Calculation (min)  41 min    Equipment Utilized During Treatment  --   during stair training   Activity Tolerance  Patient tolerated treatment well;Patient limited by pain    Behavior During Therapy  WFL for tasks assessed/performed       Past Medical History:  Diagnosis Date  . Abnormal weight gain 08/05/2013  . Absence of interventricular septum 08/05/2013  . Allergic rhinitis 04/17/2016  . Allergic urticaria due to ingested food 02/09/2016  . Anxiety   . Asthma   . Clinical depression 09/30/2013  . Complication of anesthesia    PT HAD TROUBLE BREATHING AFTER HYSTERECTOMY IN OCTOBER  . Depression   . Dizziness   . Excess weight 09/30/2013  . GERD (gastroesophageal reflux disease)   . Headache, migraine 09/30/2013  . Heart murmur   . Heavy drinker 04/11/2014   Overview:  Patient reports drinking 2-3 bottles of wine each weekend.    . History of methicillin resistant staphylococcus aureus (MRSA) 11/2015  . Infection of urinary tract 09/30/2013  . Irregular bleeding 09/30/2013  . Multiple allergies   . VSD (ventricular septal defect and aortic arch hypoplasia     Past Surgical History:  Procedure Laterality Date  . BOTOX INJECTION N/A 10/23/2016   Procedure: BOTOX INJECTION;  Surgeon: Royston Cowper, MD;  Location: [facility];  Service: Urology;  Laterality: N/A;  . BREAST BIOPSY Left 11/13/13   benign  . CESAREAN SECTION    .  COLONOSCOPY    . CORONARY ANGIOPLASTY    . CYSTOSCOPY N/A 10/23/2016   Procedure: CYSTOSCOPY;  Surgeon: Royston Cowper, MD;  Location: [facility];  Service: Urology;  Laterality: N/A;  . CYSTOSCOPY WITH HYDRODISTENSION AND BIOPSY    . LAPAROSCOPIC ASSISTED VAGINAL HYSTERECTOMY N/A 08/20/2016   Procedure: LAPAROSCOPIC ASSISTED VAGINAL HYSTERECTOMY;  Surgeon: Boykin Nearing, MD;  Location: [facility];  Service: Gynecology;  Laterality: N/A;  . LAPAROSCOPIC BILATERAL SALPINGECTOMY Bilateral 08/20/2016   Procedure: LAPAROSCOPIC BILATERAL SALPINGECTOMY;  Surgeon: Boykin Nearing, MD;  Location: [facility];  Service: Gynecology;  Laterality: Bilateral;  . NASAL SINUS SURGERY     x12    There were no vitals filed for this visit.  Subjective Assessment - 06/22/19 1738    Subjective  Patient returning after length without therapy due to recent vacation. Has been compliant with HEP and has noticed an improvement in her stability.    Pertinent History  Patient is a pleasant 48 year old female who presents for balance training/polyneuropathy. PMH includes VSD(ventricular septal defect), allergic rhinitis, UTI, depression, migraines, polyneuropathy, bilateral carpal tunnel syndrome, ADD, anxiety, basal cell carcinoma, fibroids, GERD, and two children by C section.Saw PT in September 2019 at a different facility for plantar fasciitis but reports they were not able to help.  Patient frequently loses balance but does not  fall to the ground as she is able to catch herself. She cannot squat or lunge now without losing balance and wants to be able to return to these activities. 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    Limitations  Sitting;Lifting;Standing;Walking;House hold activities;Other (comment)    How long can you sit comfortably?  limited by ADHD not by pain    How long can you stand comfortably?  stands all day for work: hairdresser    How long can you walk comfortably?  hurts her feet  to walk immediately    Patient Stated Goals  to be able to do a squat or lunge without falling over.    Currently in Pain?  Yes    Pain Score  4     Pain Location  Foot    Pain Orientation  Right;Left    Pain Descriptors / Indicators  Burning    Pain Type  Neuropathic pain    Pain Onset  More than a month ago    Pain Frequency  Constant       Manual Grade II-III mobilizations lumbar spine 10 seconds x2 trials each segment Grade III inferior mobilizations to sacrum and lateral crests 20 seconds x 3 trials each position Roller to L and R piriformis and paraspinals x 4 minutes for improved muscle tissue length and decreased muscle tension knots.  Roller to R calf musculature x 2 minutes   TherEx Supine nerve glides with modified straight leg on PT arm to increase nerve tension 20x each LE Modified robber stretch for postural education for reduction of tension on mid and low back. 30 seconds x2 trials Posterior pelvic tilt 10x Posterior pelvic tilt with adduction ball squeeze 10x 3 second holds TrA activation with penguin lateral touches in hooklying 30 seconds Standing: modified lunge with hands on wall for UE support 10x each side  Standing hip extension into crane lunge with SUE support 10x each LE Modified partial tandem with downward and forward reach (modified Tai chi); more challenging to stabilize with RLE posterior 10x each LE prostretch standing stretch 30 seconds each side                        PT Education - 06/22/19 1739    Education Details  exercise technique, stability body mechanics/posture    Person(s) Educated  Patient    Methods  Explanation;Demonstration;Tactile cues;Verbal cues    Comprehension  Verbalized understanding;Returned demonstration;Verbal cues required;Tactile cues required;Need further instruction       PT Short Term Goals - 05/18/19 1739      PT SHORT TERM GOAL #1   Title  Patient will be independent in home exercise  program to improve strength/mobility for better functional independence with ADLs.    Baseline  7/6: HEP given    Time  2    Period  Weeks    Status  New    Target Date  06/01/19        PT Long Term Goals - 06/08/19 1835      PT LONG TERM GOAL #1   Title  Patient will increase Berg Balance score by > 6 points (46/56) to demonstrate decreased fall risk during functional activities.    Baseline  7/6: 40/56    Time  8    Period  Weeks    Status  On-going      PT LONG TERM GOAL #2   Title  Patient will increase  ABC scale score >80% to demonstrate better functional mobility and better confidence with ADLs.    Baseline  7/6: 40/56    Time  8    Period  Weeks    Status  On-going      PT LONG TERM GOAL #3   Title  Patient will perform a squat and/or a lunge without using UE assistance or losing balance to return to previous exercise program.    Baseline  7/6: loses balance upon attempt    Time  8    Period  Weeks    Status  On-going      PT LONG TERM GOAL #4   Title  Patient will be independent in walking on even/uneven surface with least restrictive assistive device, for 20+ minutes without rest break, reporting some difficulty or less to improve walking tolerance with community ambulation including grocery shopping, going to church,etc.    Baseline  7/6: unable to walk for long durations without severe pain    Time  8    Period  Weeks    Status  On-going            Plan - 06/22/19 1740    Clinical Impression Statement  Patient presents to physical therapy after vacation at beach for a friends [place]. Educated on radiating symptoms, importance of lengthening musculature and nerve glides for reduction of symptoms. Patient demonstrates improved stability after manual/therex however more challenged with RLE than LLE. Patient will benefit from skilled physical therapy to decrease pain, improve coordination and stability, and improve mobility for return to functional activities  and decrease falls risk    Personal Factors and Comorbidities  Comorbidity 3+;Fitness;Past/Current Experience;Profession;Social Background;Time since onset of injury/illness/exacerbation    Comorbidities  cervical cancer, bilateral carpal tunnel syndrome, polyneuropathy, depression, migraines, VSD, ADD, anxiety, GERD    Examination-Activity Limitations  Bend;Caring for Others;Carry;Dressing;Lift;Locomotion Level;Reach Overhead;Squat;Stairs;Stand;Toileting;Transfers    Examination-Participation Restrictions  Church;Cleaning;Community Activity;Driving;Interpersonal Relationship;Laundry;Shop;Volunteer;Yard Work    [employer]chant [employer] officer  Evolving/Moderate complexity    Rehab Potential  Fair    PT Frequency  2x / week    PT Duration  8 weeks    PT Treatment/Interventions  ADLs/Self Care Home Management;Cryotherapy;Electrical Stimulation;Iontophoresis 55m/ml Dexamethasone;Moist Heat;Traction;Ultrasound;DME Instruction;Gait training;Stair training;Functional mobility training;Therapeutic activities;Therapeutic exercise;Balance training;Neuromuscular re-education;Manual techniques;Passive range of motion;Dry needling;Vestibular;Taping;Splinting;Energy conservation;Patient/family education    PT Next Visit Plan  nerve glides, airex pad, coordination, hamstring stretch    PT Home Exercise Plan  see above    Consulted and Agree with Plan of Care  Patient       Patient will benefit from skilled therapeutic intervention in order to improve the following deficits and impairments:  Abnormal gait, Decreased activity tolerance, Decreased balance, Decreased endurance, Decreased coordination, Decreased mobility, Difficulty walking, Decreased strength, Impaired flexibility, Impaired perceived functional ability, Impaired sensation, Postural dysfunction, Improper body mechanics, Pain  Visit Diagnosis: 1. Unsteadiness on feet   2. Other abnormalities of gait and mobility        Problem List Patient  Active Problem List   Diagnosis Date Noted  . GAD (generalized anxiety disorder) 06/22/2019  . Insomnia due to medical condition 06/22/2019  . Needle phobia 06/22/2019  . Bilateral carpal tunnel syndrome 08/27/2018  . Intractable migraine with aura without status migrainosus 08/27/2018  . Polyneuropathy 08/27/2018  . [place]  . Chest pain 09/23/2017  . Elevated TSH 07/03/2017  . Menorrhagia with irregular cycle 09/17/2016  . Chronic idiopathic urticaria 09/03/2016  . Postoperative state 08/20/2016  . Calcaneal spur  07/10/2016  . On prednisone therapy 06/20/2016  . Allergic rhinitis 04/17/2016  . Allergic urticaria due to ingested food 02/09/2016  . Heavy drinker 04/11/2014  . Depression 09/30/2013  . Irregular bleeding 09/30/2013  . Migraine headache 09/30/2013  . Excess weight 09/30/2013  . Frequent UTI 09/30/2013  . Infection of urinary tract 09/30/2013  . VSD (ventricular septal defect and aortic arch hypoplasia 08/05/2013  . Abnormal weight gain 08/05/2013   Janna Arch, PT, DPT   06/22/2019, 5:41 PM  [place] MAIN [facility] SERVICES [address] [place], [place], [address] Phone: [phone]   Fax:  [phone]  Name: NOVIA LANSBERRY MRN: [id number] Date of Birth: 05/13/1971

## 2019-06-24 ENCOUNTER — Ambulatory Visit: Payer: 59

## 2019-06-29 ENCOUNTER — Ambulatory Visit: Payer: 59 | Admitting: Psychiatry

## 2019-06-29 ENCOUNTER — Ambulatory Visit: Payer: 59

## 2019-07-01 ENCOUNTER — Ambulatory Visit: Payer: 59

## 2019-07-01 ENCOUNTER — Other Ambulatory Visit: Payer: Self-pay

## 2019-07-01 DIAGNOSIS — R2689 Other abnormalities of gait and mobility: Secondary | ICD-10-CM

## 2019-07-01 DIAGNOSIS — R2681 Unsteadiness on feet: Secondary | ICD-10-CM

## 2019-07-01 NOTE — Therapy (Signed)
[place] MAIN [facility] SERVICES [address] [place], [place], [address] Phone: [phone]   Fax:  [phone]  Physical Therapy Treatment  Patient Details  Name: Kristine [name] MRN: [id number] Date of Birth: [birth year] Referring Provider (PT): hemang shah    Encounter Date: 07/01/2019  PT End of Session - 07/01/19 1739    Visit Number  6    Number of Visits  16    Date for PT Re-Evaluation  07/13/19    Authorization Type  6/10 eval start 05/18/19    PT Start Time  1645    PT Stop Time  1729    PT Time Calculation (min)  44 min    Equipment Utilized During Treatment  --   during stair training   Activity Tolerance  Patient tolerated treatment well;Patient limited by pain    Behavior During Therapy  WFL for tasks assessed/performed       Past Medical History:  Diagnosis Date  . Abnormal weight gain 08/05/2013  . Absence of interventricular septum 08/05/2013  . Allergic rhinitis 04/17/2016  . Allergic urticaria due to ingested food 02/09/2016  . Anxiety   . Asthma   . Clinical depression 09/30/2013  . Complication of anesthesia    PT HAD TROUBLE BREATHING AFTER HYSTERECTOMY IN OCTOBER  . Depression   . Dizziness   . Excess weight 09/30/2013  . GERD (gastroesophageal reflux disease)   . Headache, migraine 09/30/2013  . Heart murmur   . Heavy drinker 04/11/2014   Overview:  Patient reports drinking 2-3 bottles of wine each weekend.    . History of methicillin resistant staphylococcus aureus (MRSA) 11/2015  . Infection of urinary tract 09/30/2013  . Irregular bleeding 09/30/2013  . Multiple allergies   . VSD (ventricular septal defect and aortic arch hypoplasia     Past Surgical History:  Procedure Laterality Date  . BOTOX INJECTION N/A 10/23/2016   Procedure: BOTOX INJECTION;  Surgeon: Royston Cowper, MD;  Location: [facility];  Service: Urology;  Laterality: N/A;  . BREAST BIOPSY Left 11/13/13   benign  . CESAREAN SECTION    .  COLONOSCOPY    . CORONARY ANGIOPLASTY    . CYSTOSCOPY N/A 10/23/2016   Procedure: CYSTOSCOPY;  Surgeon: Royston Cowper, MD;  Location: [facility];  Service: Urology;  Laterality: N/A;  . CYSTOSCOPY WITH HYDRODISTENSION AND BIOPSY    . LAPAROSCOPIC ASSISTED VAGINAL HYSTERECTOMY N/A 08/20/2016   Procedure: LAPAROSCOPIC ASSISTED VAGINAL HYSTERECTOMY;  Surgeon: Boykin Nearing, MD;  Location: [facility];  Service: Gynecology;  Laterality: N/A;  . LAPAROSCOPIC BILATERAL SALPINGECTOMY Bilateral 08/20/2016   Procedure: LAPAROSCOPIC BILATERAL SALPINGECTOMY;  Surgeon: Boykin Nearing, MD;  Location: [facility];  Service: Gynecology;  Laterality: Bilateral;  . NASAL SINUS SURGERY     x12    There were no vitals filed for this visit.  Subjective Assessment - 07/01/19 1736    Subjective  Patient reports she has been having increased stress and tension. She is having more pain throughout her body and feeling more limited in her motion. She reports it may be her new medication.    Pertinent History  Patient is a pleasant 48 year old female who presents for balance training/polyneuropathy. PMH includes VSD(ventricular septal defect), allergic rhinitis, UTI, depression, migraines, polyneuropathy, bilateral carpal tunnel syndrome, ADD, anxiety, basal cell carcinoma, fibroids, GERD, and two children by C section.Saw PT in September 2019 at a different facility for plantar fasciitis but reports they were not able  to help.  Patient frequently loses balance but does not fall to the ground as she is able to catch herself. She cannot squat or lunge now without losing balance and wants to be able to return to these activities. 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    Limitations  Sitting;Lifting;Standing;Walking;House hold activities;Other (comment)    How long can you sit comfortably?  limited by ADHD not by pain    How long can you stand comfortably?  stands all day for work: hairdresser    How  long can you walk comfortably?  hurts her feet to walk immediately    Patient Stated Goals  to be able to do a squat or lunge without falling over.    Currently in Pain?  Yes    Pain Score  7     Pain Location  Foot    Pain Orientation  Right;Left    Pain Descriptors / Indicators  Burning    Pain Type  Neuropathic pain    Pain Onset  More than a month ago    Pain Frequency  Constant         Manual Grade II-III mobilizations lumbar spine 10 seconds x2 trials each segment Grade II-III mobilizations thoracic spine and costal joints 10 seconds x 3 trials for postural correction  Grade III inferior mobilizations to sacrum and lateral crests 20 seconds x 3 trials each position    TherEx Supine nerve glides with modified straight leg on PT arm to increase nerve tension 20x each LE Modified robber stretch for postural education for reduction of tension on mid and low back. 30 seconds x2 trials Posterior pelvic tilt 10x Posterior pelvic tilt with adduction ball squeeze 10x 3 second holds Posterior pelvic tilt with RTB abduction 10x 3 second holds  TrA activation in hooklying: inhale, exhale, then bring knee slightly abducted 45 degrees x 12 each side Modified standing plank with bent knee for gastroc/soleous stretch alternating knees x 2 minutes prostretch standing stretch 30 seconds x 4 trials LLE  swiss ball forward rollout : 8x 10 second holds, lateral direction in diagonal 8x each direction.       Pt educated throughout session about proper posture and technique with exercises. Improved exercise technique, movement at target joints, use of target muscles after min to mod verbal, visual, tactile cues                  PT Education - 07/01/19 1737    Education Details  exercise technique, stability, posture    Person(s) Educated  Patient    Methods  Explanation;Demonstration;Tactile cues;Verbal cues    Comprehension  Verbalized understanding;Returned  demonstration;Verbal cues required;Tactile cues required       PT Short Term Goals - 05/18/19 1739      PT SHORT TERM GOAL #1   Title  Patient will be independent in home exercise program to improve strength/mobility for better functional independence with ADLs.    Baseline  7/6: HEP given    Time  2    Period  Weeks    Status  New    Target Date  06/01/19        PT Long Term Goals - 06/08/19 1835      PT LONG TERM GOAL #1   Title  Patient will increase Berg Balance score by > 6 points (46/56) to demonstrate decreased fall risk during functional activities.    Baseline  7/6: 40/56    Time  8    Period  Weeks    Status  On-going      PT LONG TERM GOAL #2   Title  Patient will increase ABC scale score >80% to demonstrate better functional mobility and better confidence with ADLs.    Baseline  7/6: 40/56    Time  8    Period  Weeks    Status  On-going      PT LONG TERM GOAL #3   Title  Patient will perform a squat and/or a lunge without using UE assistance or losing balance to return to previous exercise program.    Baseline  7/6: loses balance upon attempt    Time  8    Period  Weeks    Status  On-going      PT LONG TERM GOAL #4   Title  Patient will be independent in walking on even/uneven surface with least restrictive assistive device, for 20+ minutes without rest break, reporting some difficulty or less to improve walking tolerance with community ambulation including grocery shopping, going to church,etc.    Baseline  7/6: unable to walk for long durations without severe pain    Time  8    Period  Weeks    Status  On-going            Plan - 07/01/19 1740    Clinical Impression Statement  Patient presents with increased muscle tension and postural dysfunction. Increased tension of left posterior calf muscle noted and relieved with prolonged muscle tissue lengthening. Patient intitially tender with mobilizations but improved with posterior tilts and patient  demonstrated improved step length. Patient will benefit from skilled physical therapy to decrease pain, improve coordination and stability, and improve mobility for return to functional activities and decrease falls risk    Personal Factors and Comorbidities  Comorbidity 3+;Fitness;Past/Current Experience;Profession;Social Background;Time since onset of injury/illness/exacerbation    Comorbidities  cervical cancer, bilateral carpal tunnel syndrome, polyneuropathy, depression, migraines, VSD, ADD, anxiety, GERD    Examination-Activity Limitations  Bend;Caring for Others;Carry;Dressing;Lift;Locomotion Level;Reach Overhead;Squat;Stairs;Stand;Toileting;Transfers    Examination-Participation Restrictions  Church;Cleaning;Community Activity;Driving;Interpersonal Relationship;Laundry;Shop;Volunteer;Yard Work    Merchant [employer] officer  Evolving/Moderate complexity    Rehab Potential  Fair    PT Frequency  2x / week    PT Duration  8 weeks    PT Treatment/Interventions  ADLs/Self Care Home Management;Cryotherapy;Electrical Stimulation;Iontophoresis 4mg /ml Dexamethasone;Moist Heat;Traction;Ultrasound;DME Instruction;Gait training;Stair training;Functional mobility training;Therapeutic activities;Therapeutic exercise;Balance training;Neuromuscular re-education;Manual techniques;Passive range of motion;Dry needling;Vestibular;Taping;Splinting;Energy conservation;Patient/family education    PT Next Visit Plan  nerve glides, airex pad, coordination, hamstring stretch    PT Home Exercise Plan  see above    Consulted and Agree with Plan of Care  Patient       Patient will benefit from skilled therapeutic intervention in order to improve the following deficits and impairments:  Abnormal gait, Decreased activity tolerance, Decreased balance, Decreased endurance, Decreased coordination, Decreased mobility, Difficulty walking, Decreased strength, Impaired flexibility, Impaired perceived functional ability,  Impaired sensation, Postural dysfunction, Improper body mechanics, Pain  Visit Diagnosis: 1. Unsteadiness on feet   2. Other abnormalities of gait and mobility        Problem List Patient Active Problem List   Diagnosis Date Noted  . GAD (generalized anxiety disorder) 06/22/2019  . Insomnia due to medical condition 06/22/2019  . Needle phobia 06/22/2019  . Bilateral carpal tunnel syndrome 08/27/2018  . Intractable migraine with aura without status migrainosus 08/27/2018  . Polyneuropathy 08/27/2018  . Cervical cancer ([place]) 08/18/2018  . Chest  pain 09/23/2017  . Elevated TSH 07/03/2017  . Menorrhagia with irregular cycle 09/17/2016  . Chronic idiopathic urticaria 09/03/2016  . Postoperative state 08/20/2016  . Calcaneal spur 07/10/2016  . On prednisone therapy 06/20/2016  . Allergic rhinitis 04/17/2016  . Allergic urticaria due to ingested food 02/09/2016  . Heavy drinker 04/11/2014  . Depression 09/30/2013  . Irregular bleeding 09/30/2013  . Migraine headache 09/30/2013  . Excess weight 09/30/2013  . Frequent UTI 09/30/2013  . Infection of urinary tract 09/30/2013  . VSD (ventricular septal defect and aortic arch hypoplasia 08/05/2013  . Abnormal weight gain 08/05/2013   Janna Arch, PT, DPT   07/01/2019, 5:41 PM  [place] MAIN [facility] SERVICES [address] [place], [place], [address] Phone: [phone]   Fax:  [phone]  Name: LYNITA GROSECLOSE MRN: [id number] Date of Birth: 1971-01-11

## 2019-07-06 ENCOUNTER — Other Ambulatory Visit: Payer: Self-pay

## 2019-07-06 ENCOUNTER — Ambulatory Visit: Payer: 59

## 2019-07-06 DIAGNOSIS — R2681 Unsteadiness on feet: Secondary | ICD-10-CM

## 2019-07-06 DIAGNOSIS — R2689 Other abnormalities of gait and mobility: Secondary | ICD-10-CM

## 2019-07-06 NOTE — Therapy (Signed)
[name] [facility] SERVICES [address] [place], [place], [address] Phone: [phone]   Fax:  [phone]  Physical Therapy Treatment  Patient Details  Name: Kristine [name] MRN: [id number] Date of Birth: Oct 22, 1971 Referring Provider (PT): hemang shah    Encounter Date: 07/06/2019  PT End of Session - 07/06/19 1751    Visit Number  7    Number of Visits  16    Date for PT Re-Evaluation  07/13/19    Authorization Type  7/10 eval start 05/18/19    PT Start Time  1647    PT Stop Time  1730    PT Time Calculation (min)  43 min    Equipment Utilized During Treatment  --   during stair training   Activity Tolerance  Patient tolerated treatment well;Patient limited by pain    Behavior During Therapy  [facility] for tasks assessed/performed       Past Medical History:  Diagnosis Date  . Abnormal weight gain 08/05/2013  . Absence of interventricular septum 08/05/2013  . Allergic rhinitis 04/17/2016  . Allergic urticaria due to ingested food 02/09/2016  . Anxiety   . Asthma   . Clinical depression 09/30/2013  . Complication of anesthesia    PT HAD TROUBLE BREATHING AFTER HYSTERECTOMY IN OCTOBER  . Depression   . Dizziness   . Excess weight 09/30/2013  . GERD (gastroesophageal reflux disease)   . Headache, migraine 09/30/2013  . Heart murmur   . Heavy drinker 04/11/2014   Overview:  Patient reports drinking 2-3 bottles of wine each weekend.    . History of methicillin resistant staphylococcus aureus (MRSA) 11/2015  . Infection of urinary tract 09/30/2013  . Irregular bleeding 09/30/2013  . Multiple allergies   . VSD (ventricular septal defect and aortic arch hypoplasia     Past Surgical History:  Procedure Laterality Date  . BOTOX INJECTION N/A 10/23/2016   Procedure: BOTOX INJECTION;  Surgeon: Royston Cowper, MD;  Location: [facility] ORS;  Service: Urology;  Laterality: N/A;  . BREAST BIOPSY Left 11/13/13   benign  . CESAREAN SECTION    .  COLONOSCOPY    . CORONARY ANGIOPLASTY    . CYSTOSCOPY N/A 10/23/2016   Procedure: CYSTOSCOPY;  Surgeon: Royston Cowper, MD;  Location: [facility] ORS;  Service: Urology;  Laterality: N/A;  . CYSTOSCOPY WITH HYDRODISTENSION AND BIOPSY    . LAPAROSCOPIC ASSISTED VAGINAL HYSTERECTOMY N/A 08/20/2016   Procedure: LAPAROSCOPIC ASSISTED VAGINAL HYSTERECTOMY;  Surgeon: Boykin Nearing, MD;  Location: [facility] ORS;  Service: Gynecology;  Laterality: N/A;  . LAPAROSCOPIC BILATERAL SALPINGECTOMY Bilateral 08/20/2016   Procedure: LAPAROSCOPIC BILATERAL SALPINGECTOMY;  Surgeon: Boykin Nearing, MD;  Location: [facility] ORS;  Service: Gynecology;  Laterality: Bilateral;  . NASAL SINUS SURGERY     x12    There were no vitals filed for this visit.  Subjective Assessment - 07/06/19 1745    Subjective  Patient reports her neck, head, and upper back is radiating down her spine to her legs. Reports her LE's are improving as well as her lower back.    Pertinent History  Patient is a pleasant 48 year old female who presents for balance training/polyneuropathy. PMH includes VSD(ventricular septal defect), allergic rhinitis, UTI, depression, migraines, polyneuropathy, bilateral carpal tunnel syndrome, ADD, anxiety, basal cell carcinoma, fibroids, GERD, and two children by C section.Saw PT in September 2019 at a different facility for plantar fasciitis but reports they were not able to help.  Patient frequently loses  balance but does not fall to the ground as she is able to catch herself. She cannot squat or lunge now without losing balance and wants to be able to return to these activities. 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    Limitations  Sitting;Lifting;Standing;Walking;House hold activities;Other (comment)    How long can you sit comfortably?  limited by ADHD not by pain    How long can you stand comfortably?  stands all day for work: hairdresser    How long can you walk comfortably?  hurts her feet to  walk immediately    Patient Stated Goals  to be able to do a squat or lunge without falling over.    Currently in Pain?  Yes    Pain Score  6     Pain Location  Foot    Pain Orientation  Right;Left    Pain Descriptors / Indicators  Burning    Pain Type  Neuropathic pain    Pain Onset  More than a month ago    Pain Frequency  Constant    Multiple Pain Sites  Yes    Pain Score  9    Pain Location  Neck    Pain Orientation  Posterior    Pain Descriptors / Indicators  Aching;Stabbing    Pain Type  Chronic pain;Neuropathic pain    Pain Radiating Towards  up and down spine    Pain Onset  More than a month ago    Pain Frequency  Constant    Aggravating Factors   everything    Pain Relieving Factors  traction         Manual Grade II-III mobilizations lumbar spine 10 seconds x2 trials each segment Grade II-III mobilizations thoracic spine and costal joints 10 seconds x 3 trials for postural correction; J mobilization in inferior direction 20 seconds x 3 trials  Grade III inferior mobilizations to sacrum and lateral crests 20 seconds x 3 trials each position Suboccipital release 3x20 seconds SAD inferior direction with belt 3x 20 seconds.     TherEx Supine nerve glides with modified straight leg on PT arm to increase nerve tension 20x each LE Standing corner pectoral stretch with cueing for elbows down for reduction of shoulder pain 2x 30 seconds Seated posterior pelvic tilts 10x Seated TrA activation 10x  SupinePosterior pelvic tilt with adduction ball squeeze 10x 3 second holds TrA activation in hooklying: inhale, exhale, then bring knee slightly abducted 45 degrees x 12 each side Supine chin tucks for postural correction and reduction of symptoms 10x   Patient educated on need for referral for cervical spine for radiating symptoms. Agreeable to confer with physician about PT.     Pt educated throughout session about proper posture and technique with exercises. Improved exercise  technique, movement at target joints, use of target muscles after min to mod verbal, visual, tactile cues                      PT Education - 07/06/19 1747    Education Details  patient to get order for cervical spine, posture, body mechanics    Person(s) Educated  Patient    Methods  Explanation;Demonstration;Tactile cues;Verbal cues    Comprehension  Verbalized understanding;Returned demonstration;Verbal cues required;Tactile cues required       PT Short Term Goals - 05/18/19 1739      PT SHORT TERM GOAL #1   Title  Patient will be independent in home  exercise program to improve strength/mobility for better functional independence with ADLs.    Baseline  7/6: HEP given    Time  2    Period  Weeks    Status  New    Target Date  06/01/19        PT Long Term Goals - 06/08/19 1835      PT LONG TERM GOAL #1   Title  Patient will increase Berg Balance score by > 6 points (46/56) to demonstrate decreased fall risk during functional activities.    Baseline  7/6: 40/56    Time  8    Period  Weeks    Status  On-going      PT LONG TERM GOAL #2   Title  Patient will increase ABC scale score >80% to demonstrate better functional mobility and better confidence with ADLs.    Baseline  7/6: 40/56    Time  8    Period  Weeks    Status  On-going      PT LONG TERM GOAL #3   Title  Patient will perform a squat and/or a lunge without using UE assistance or losing balance to return to previous exercise program.    Baseline  7/6: loses balance upon attempt    Time  8    Period  Weeks    Status  On-going      PT LONG TERM GOAL #4   Title  Patient will be independent in walking on even/uneven surface with least restrictive assistive device, for 20+ minutes without rest break, reporting some difficulty or less to improve walking tolerance with community ambulation including grocery shopping, going to church,etc.    Baseline  7/6: unable to walk for long durations without  severe pain    Time  8    Period  Weeks    Status  On-going            Plan - 07/06/19 1753    Clinical Impression Statement  Patient is progressing with decreased LE radiating symptoms. However her cervical spine is potentially causing radiating symptoms down her spinal tract to lumbar region. Patient agreeable to request referral for cervical spine from physician for potential reduction of cervical symptoms. Patient will benefit from skilled physical therapy to decrease pain, improve coordination and stability, and improve mobility for return to functional activities and decrease falls risk    Personal Factors and Comorbidities  Comorbidity 3+;Fitness;Past/Current Experience;Profession;Social Background;Time since onset of injury/illness/exacerbation    Comorbidities  cervical cancer, bilateral carpal tunnel syndrome, polyneuropathy, depression, migraines, VSD, ADD, anxiety, GERD    Examination-Activity Limitations  Bend;Caring for Others;Carry;Dressing;Lift;Locomotion Level;Reach Overhead;Squat;Stairs;Stand;Toileting;Transfers    Examination-Participation Restrictions  Church;Cleaning;Community Activity;Driving;Interpersonal Relationship;Laundry;Shop;Volunteer;Yard Work    [employer]chant [employer] officer  Evolving/Moderate complexity    Rehab Potential  Fair    PT Frequency  2x / week    PT Duration  8 weeks    PT Treatment/Interventions  ADLs/Self Care Home Management;Cryotherapy;Electrical Stimulation;Iontophoresis 4mg /ml Dexamethasone;Moist Heat;Traction;Ultrasound;DME Instruction;Gait training;Stair training;Functional mobility training;Therapeutic activities;Therapeutic exercise;Balance training;Neuromuscular re-education;Manual techniques;Passive range of motion;Dry needling;Vestibular;Taping;Splinting;Energy conservation;Patient/family education    PT Next Visit Plan  nerve glides, airex pad, coordination, hamstring stretch    PT Home Exercise Plan  see above    Consulted and  Agree with Plan of Care  Patient       Patient will benefit from skilled therapeutic intervention in order to improve the following deficits and impairments:  Abnormal gait, Decreased activity tolerance, Decreased balance, Decreased endurance, Decreased coordination, Decreased  mobility, Difficulty walking, Decreased strength, Impaired flexibility, Impaired perceived functional ability, Impaired sensation, Postural dysfunction, Improper body mechanics, Pain  Visit Diagnosis: Unsteadiness on feet  Other abnormalities of gait and mobility     Problem List Patient Active Problem List   Diagnosis Date Noted  . GAD (generalized anxiety disorder) 06/22/2019  . Insomnia due to medical condition 06/22/2019  . Needle phobia 06/22/2019  . Bilateral carpal tunnel syndrome 08/27/2018  . Intractable migraine with aura without status migrainosus 08/27/2018  . Polyneuropathy 08/27/2018  . Cervical cancer ([place]) 08/18/2018  . Chest pain 09/23/2017  . Elevated TSH 07/03/2017  . Menorrhagia with irregular cycle 09/17/2016  . Chronic idiopathic urticaria 09/03/2016  . Postoperative state 08/20/2016  . Calcaneal spur 07/10/2016  . On prednisone therapy 06/20/2016  . Allergic rhinitis 04/17/2016  . Allergic urticaria due to ingested food 02/09/2016  . Heavy drinker 04/11/2014  . Depression 09/30/2013  . Irregular bleeding 09/30/2013  . Migraine headache 09/30/2013  . Excess weight 09/30/2013  . Frequent UTI 09/30/2013  . Infection of urinary tract 09/30/2013  . VSD (ventricular septal defect and aortic arch hypoplasia 08/05/2013  . Abnormal weight gain 08/05/2013   Janna Arch, PT, DPT    07/06/2019, 5:54 PM  [place] MAIN [facility] SERVICES [address] [place], [place], [address] Phone: [phone]   Fax:  [phone]  Name: CREDA TONDREAU MRN: [id number] Date of Birth: 1971/10/22

## 2019-07-08 ENCOUNTER — Ambulatory Visit: Payer: 59

## 2019-07-08 ENCOUNTER — Other Ambulatory Visit: Payer: Self-pay

## 2019-07-08 DIAGNOSIS — R2681 Unsteadiness on feet: Secondary | ICD-10-CM

## 2019-07-08 DIAGNOSIS — R2689 Other abnormalities of gait and mobility: Secondary | ICD-10-CM

## 2019-07-09 NOTE — Therapy (Signed)
[place] MAIN [facility] SERVICES [address] [place], [place], [address] Phone: [phone]   Fax:  [phone]  Physical Therapy Treatment  Patient Details  Name: Kristine [name] MRN: [id number] Date of Birth: Aug 28, 1971 Referring Provider (PT): hemang shah    Encounter Date: 07/08/2019  PT End of Session - 07/09/19 0756    Visit Number  8    Number of Visits  16    Date for PT Re-Evaluation  07/13/19    Authorization Type  8/10 eval start 05/18/19    PT Start Time  1645    PT Stop Time  1729    PT Time Calculation (min)  44 min    Equipment Utilized During Treatment  --   during stair training   Activity Tolerance  Patient tolerated treatment well;Patient limited by pain    Behavior During Therapy  WFL for tasks assessed/performed       Past Medical History:  Diagnosis Date  . Abnormal weight gain 08/05/2013  . Absence of interventricular septum 08/05/2013  . Allergic rhinitis 04/17/2016  . Allergic urticaria due to ingested food 02/09/2016  . Anxiety   . Asthma   . Clinical depression 09/30/2013  . Complication of anesthesia    PT HAD TROUBLE BREATHING AFTER HYSTERECTOMY IN OCTOBER  . Depression   . Dizziness   . Excess weight 09/30/2013  . GERD (gastroesophageal reflux disease)   . Headache, migraine 09/30/2013  . Heart murmur   . Heavy drinker 04/11/2014   Overview:  Patient reports drinking 2-3 bottles of wine each weekend.    . History of methicillin resistant staphylococcus aureus (MRSA) 11/2015  . Infection of urinary tract 09/30/2013  . Irregular bleeding 09/30/2013  . Multiple allergies   . VSD (ventricular septal defect and aortic arch hypoplasia     Past Surgical History:  Procedure Laterality Date  . BOTOX INJECTION N/A 10/23/2016   Procedure: BOTOX INJECTION;  Surgeon: Royston Cowper, MD;  Location: [facility] ORS;  Service: Urology;  Laterality: N/A;  . BREAST BIOPSY Left 11/13/13   benign  . CESAREAN SECTION    .  COLONOSCOPY    . CORONARY ANGIOPLASTY    . CYSTOSCOPY N/A 10/23/2016   Procedure: CYSTOSCOPY;  Surgeon: Royston Cowper, MD;  Location: [facility] ORS;  Service: Urology;  Laterality: N/A;  . CYSTOSCOPY WITH HYDRODISTENSION AND BIOPSY    . LAPAROSCOPIC ASSISTED VAGINAL HYSTERECTOMY N/A 08/20/2016   Procedure: LAPAROSCOPIC ASSISTED VAGINAL HYSTERECTOMY;  Surgeon: Boykin Nearing, MD;  Location: [facility] ORS;  Service: Gynecology;  Laterality: N/A;  . LAPAROSCOPIC BILATERAL SALPINGECTOMY Bilateral 08/20/2016   Procedure: LAPAROSCOPIC BILATERAL SALPINGECTOMY;  Surgeon: Boykin Nearing, MD;  Location: [facility] ORS;  Service: Gynecology;  Laterality: Bilateral;  . NASAL SINUS SURGERY     x12    There were no vitals filed for this visit.  Subjective Assessment - 07/09/19 0754    Subjective  Patient reports her upper back and neck are still painful but slightly better than previous session. Patient reports her order for her neck has been approved and should be in soon.    Pertinent History  Patient is a pleasant 48 year old female who presents for balance training/polyneuropathy. PMH includes VSD(ventricular septal defect), allergic rhinitis, UTI, depression, migraines, polyneuropathy, bilateral carpal tunnel syndrome, ADD, anxiety, basal cell carcinoma, fibroids, GERD, and two children by C section.Saw PT in September 2019 at a different facility for plantar fasciitis but reports they were not able to help.  Patient frequently loses balance but does not fall to the ground as she is able to catch herself. She cannot squat or lunge now without losing balance and wants to be able to return to these activities. 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    Limitations  Sitting;Lifting;Standing;Walking;House hold activities;Other (comment)    How long can you sit comfortably?  limited by ADHD not by pain    How long can you stand comfortably?  stands all day for work: hairdresser    How long can you  walk comfortably?  hurts her feet to walk immediately    Patient Stated Goals  to be able to do a squat or lunge without falling over.    Currently in Pain?  Yes    Pain Score  5     Pain Location  Back    Pain Orientation  Lower;Mid;Upper    Pain Descriptors / Indicators  Aching;Jabbing;Radiating    Pain Type  Chronic pain;Neuropathic pain    Pain Onset  More than a month ago    Pain Frequency  Constant    Pain Onset  More than a month ago             Manual Grade II-III mobilizations lumbar spine 10 seconds x2 trials each segment Grade II-III mobilizations thoracic spine and costal joints 10 seconds x 3 trials for postural correction; J mobilization in inferior direction 20 seconds x 3 trials  Grade III inferior mobilizations to sacrum and lateral crests 20 seconds x 3 trials each position Suboccipital release 3x20 seconds SAD inferior direction with belt 3x 20 seconds.     TherEx Seated posterior pelvic tilts 10x Seated TrA activation 10x  Supine chin tucks for postural correction and reduction of symptoms 10x   Seated TrA on swiss ball 10x Seated TrA on swiss ball with heel lifts 10x Seated TrA on swiss ball with UE raises alternating pattern 10x each  Seated TrA on swiss ball with hip flexion marches, max cueing for abdominal activation for reduction of movement. 10x each Le, very challenging  Standing press swiss ball down into table with slight flexion of knees for TrA activation 10x 3 second holds  Modified squat with plinth table indicating depth of squat 15x minimal LOB  Modified posterior lunge with rotational pelvic position 10x each LE Modified plank df of alternating LE's 60 seconds, 2 trials    Patient educated on need for referral for cervical spine for radiating symptoms. Agreeable to confer with physician about PT.      Pt educated throughout session about proper posture and technique with exercises. Improved exercise technique, movement at target joints,  use of target muscles after min to mod verbal, visual, tactile cues                      PT Education - 07/09/19 0756    Education Details  exercise technique, body mechanics, manual    Person(s) Educated  Patient    Methods  Explanation;Demonstration;Tactile cues;Verbal cues    Comprehension  Verbalized understanding;Returned demonstration;Verbal cues required;Tactile cues required       PT Short Term Goals - 05/18/19 1739      PT SHORT TERM GOAL #1   Title  Patient will be independent in home exercise program to improve strength/mobility for better functional independence with ADLs.    Baseline  7/6: HEP given    Time  2    Period  Weeks  Status  New    Target Date  06/01/19        PT Long Term Goals - 06/08/19 1835      PT LONG TERM GOAL #1   Title  Patient will increase Berg Balance score by > 6 points (46/56) to demonstrate decreased fall risk during functional activities.    Baseline  7/6: 40/56    Time  8    Period  Weeks    Status  On-going      PT LONG TERM GOAL #2   Title  Patient will increase ABC scale score >80% to demonstrate better functional mobility and better confidence with ADLs.    Baseline  7/6: 40/56    Time  8    Period  Weeks    Status  On-going      PT LONG TERM GOAL #3   Title  Patient will perform a squat and/or a lunge without using UE assistance or losing balance to return to previous exercise program.    Baseline  7/6: loses balance upon attempt    Time  8    Period  Weeks    Status  On-going      PT LONG TERM GOAL #4   Title  Patient will be independent in walking on even/uneven surface with least restrictive assistive device, for 20+ minutes without rest break, reporting some difficulty or less to improve walking tolerance with community ambulation including grocery shopping, going to church,etc.    Baseline  7/6: unable to walk for long durations without severe pain    Time  8    Period  Weeks    Status   On-going            Plan - 07/09/19 0800    Clinical Impression Statement  Patient progressing with core and stability interventions for posture with challenges to COM requiring activation of TrA. Patient limited with each intervention due to fatigue from postural muscles and requires max cueing for posture and spinal alignment. Patient will benefit from skilled physical therapy to decrease pain, improve coordination and stability, and improve mobility for return to functional activities and decrease falls risk    Personal Factors and Comorbidities  Comorbidity 3+;Fitness;Past/Current Experience;Profession;Social Background;Time since onset of injury/illness/exacerbation    Comorbidities  cervical cancer, bilateral carpal tunnel syndrome, polyneuropathy, depression, migraines, VSD, ADD, anxiety, GERD    Examination-Activity Limitations  Bend;Caring for Others;Carry;Dressing;Lift;Locomotion Level;Reach Overhead;Squat;Stairs;Stand;Toileting;Transfers    Examination-Participation Restrictions  Church;Cleaning;Community Activity;Driving;Interpersonal Relationship;Laundry;Shop;Volunteer;Yard Work    [employer]chant [employer] officer  Evolving/Moderate complexity    Rehab Potential  Fair    PT Frequency  2x / week    PT Duration  8 weeks    PT Treatment/Interventions  ADLs/Self Care Home Management;Cryotherapy;Electrical Stimulation;Iontophoresis 4mg /ml Dexamethasone;Moist Heat;Traction;Ultrasound;DME Instruction;Gait training;Stair training;Functional mobility training;Therapeutic activities;Therapeutic exercise;Balance training;Neuromuscular re-education;Manual techniques;Passive range of motion;Dry needling;Vestibular;Taping;Splinting;Energy conservation;Patient/family education    PT Next Visit Plan  nerve glides, airex pad, coordination, hamstring stretch    PT Home Exercise Plan  see above    Consulted and Agree with Plan of Care  Patient       Patient will benefit from skilled therapeutic  intervention in order to improve the following deficits and impairments:  Abnormal gait, Decreased activity tolerance, Decreased balance, Decreased endurance, Decreased coordination, Decreased mobility, Difficulty walking, Decreased strength, Impaired flexibility, Impaired perceived functional ability, Impaired sensation, Postural dysfunction, Improper body mechanics, Pain  Visit Diagnosis: Unsteadiness on feet  Other abnormalities of gait and mobility     Problem  List Patient Active Problem List   Diagnosis Date Noted  . GAD (generalized anxiety disorder) 06/22/2019  . Insomnia due to medical condition 06/22/2019  . Needle phobia 06/22/2019  . Bilateral carpal tunnel syndrome 08/27/2018  . Intractable migraine with aura without status migrainosus 08/27/2018  . Polyneuropathy 08/27/2018  . Cervical cancer ([place]) 08/18/2018  . Chest pain 09/23/2017  . Elevated TSH 07/03/2017  . Menorrhagia with irregular cycle 09/17/2016  . Chronic idiopathic urticaria 09/03/2016  . Postoperative state 08/20/2016  . Calcaneal spur 07/10/2016  . On prednisone therapy 06/20/2016  . Allergic rhinitis 04/17/2016  . Allergic urticaria due to ingested food 02/09/2016  . Heavy drinker 04/11/2014  . Depression 09/30/2013  . Irregular bleeding 09/30/2013  . Migraine headache 09/30/2013  . Excess weight 09/30/2013  . Frequent UTI 09/30/2013  . Infection of urinary tract 09/30/2013  . VSD (ventricular septal defect and aortic arch hypoplasia 08/05/2013  . Abnormal weight gain 08/05/2013   Janna Arch, PT, DPT   07/09/2019, 8:01 AM  [place] MAIN [facility] SERVICES [address] [place], [place], [address] Phone: [phone]   Fax:  [phone]  Name: Kristine [name] MRN: [id number] Date of Birth: 1971-08-25

## 2019-07-13 ENCOUNTER — Ambulatory Visit: Payer: 59

## 2019-07-15 ENCOUNTER — Other Ambulatory Visit: Payer: Self-pay

## 2019-07-15 ENCOUNTER — Ambulatory Visit: Payer: 59 | Attending: Neurology

## 2019-07-15 DIAGNOSIS — M5412 Radiculopathy, cervical region: Secondary | ICD-10-CM | POA: Diagnosis present

## 2019-07-15 DIAGNOSIS — R2689 Other abnormalities of gait and mobility: Secondary | ICD-10-CM | POA: Diagnosis present

## 2019-07-15 DIAGNOSIS — M542 Cervicalgia: Secondary | ICD-10-CM | POA: Diagnosis present

## 2019-07-15 DIAGNOSIS — R2681 Unsteadiness on feet: Secondary | ICD-10-CM | POA: Insufficient documentation

## 2019-07-15 NOTE — Therapy (Signed)
[place] MAIN [facility] SERVICES [address] [place], [place], [address] Phone: [phone]   Fax:  [phone]  Physical Therapy Treatment  Patient Details  Name: Kristine [name] MRN: [id number] Date of Birth: 23-Feb-1971 Referring Provider (PT): hemang shah    Encounter Date: 07/15/2019  PT End of Session - 07/15/19 1748    Visit Number  9    Number of Visits  25    Date for PT Re-Evaluation  09/09/19    Authorization Type  9/10 eval start 06/13/98, recert 3/7/    PT Start Time  1644    PT Stop Time  1730    PT Time Calculation (min)  46 min    Equipment Utilized During Treatment  --   during stair training   Activity Tolerance  Patient tolerated treatment well;Patient limited by pain    Behavior During Therapy  WFL for tasks assessed/performed       Past Medical History:  Diagnosis Date  . Abnormal weight gain 08/05/2013  . Absence of interventricular septum 08/05/2013  . Allergic rhinitis 04/17/2016  . Allergic urticaria due to ingested food 02/09/2016  . Anxiety   . Asthma   . Clinical depression 09/30/2013  . Complication of anesthesia    PT HAD TROUBLE BREATHING AFTER HYSTERECTOMY IN OCTOBER  . Depression   . Dizziness   . Excess weight 09/30/2013  . GERD (gastroesophageal reflux disease)   . Headache, migraine 09/30/2013  . Heart murmur   . Heavy drinker 04/11/2014   Overview:  Patient reports drinking 2-3 bottles of wine each weekend.    . History of methicillin resistant staphylococcus aureus (MRSA) 11/2015  . Infection of urinary tract 09/30/2013  . Irregular bleeding 09/30/2013  . Multiple allergies   . VSD (ventricular septal defect and aortic arch hypoplasia     Past Surgical History:  Procedure Laterality Date  . BOTOX INJECTION N/A 10/23/2016   Procedure: BOTOX INJECTION;  Surgeon: Royston Cowper, MD;  Location: [facility];  Service: Urology;  Laterality: N/A;  . BREAST BIOPSY Left 11/13/13   benign  . CESAREAN  SECTION    . COLONOSCOPY    . CORONARY ANGIOPLASTY    . CYSTOSCOPY N/A 10/23/2016   Procedure: CYSTOSCOPY;  Surgeon: Royston Cowper, MD;  Location: [facility];  Service: Urology;  Laterality: N/A;  . CYSTOSCOPY WITH HYDRODISTENSION AND BIOPSY    . LAPAROSCOPIC ASSISTED VAGINAL HYSTERECTOMY N/A 08/20/2016   Procedure: LAPAROSCOPIC ASSISTED VAGINAL HYSTERECTOMY;  Surgeon: Boykin Nearing, MD;  Location: [facility];  Service: Gynecology;  Laterality: N/A;  . LAPAROSCOPIC BILATERAL SALPINGECTOMY Bilateral 08/20/2016   Procedure: LAPAROSCOPIC BILATERAL SALPINGECTOMY;  Surgeon: Boykin Nearing, MD;  Location: [facility];  Service: Gynecology;  Laterality: Bilateral;  . NASAL SINUS SURGERY     x12    There were no vitals filed for this visit.  Subjective Assessment - 07/15/19 1744    Subjective  Patient has new order for cervical pain to add to current POC. Patient has history of cervical pain for as long as she can remember. Radiating symptoms down bilateral arms and limited sleep is impacting her quality of life.    Pertinent History  Patient is a pleasant 48 year old female who presents for balance training/polyneuropathy. PMH includes VSD(ventricular septal defect), allergic rhinitis, UTI, depression, migraines, polyneuropathy, bilateral carpal tunnel syndrome, ADD, anxiety, basal cell carcinoma, fibroids, GERD, and two children by C section.Saw PT in September 2019 at a different facility for  plantar fasciitis but reports they were not able to help.  Patient frequently loses balance but does not fall to the ground as she is able to catch herself. She cannot squat or lunge now without losing balance and wants to be able to return to these activities. 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    Limitations  Sitting;Lifting;Standing;Walking;House hold activities;Other (comment)    How long can you sit comfortably?  limited by ADHD not by pain    How long can you stand comfortably?   stands all day for work: hairdresser    How long can you walk comfortably?  hurts her feet to walk immediately    Patient Stated Goals  to be able to do a squat or lunge without falling over.    Currently in Pain?  Yes    Pain Score  5     Pain Location  Back    Pain Orientation  Lower    Pain Descriptors / Indicators  Aching    Pain Type  Chronic pain;Neuropathic pain    Pain Onset  More than a month ago    Pain Score  8    Pain Location  Neck    Pain Orientation  Posterior;Anterior;Medial    Pain Descriptors / Indicators  Radiating;Throbbing;Tightness    Pain Type  Chronic pain;Neuropathic pain    Pain Radiating Towards  up and down bilateral arms    Pain Onset  More than a month ago    Pain Frequency  Constant    Aggravating Factors   sleeping, moving    Pain Relieving Factors  traction, heating pad    Effect of Pain on Daily Activities  limits sleep and quality of life      Subjective: Patient reports her neck has been hurting her as long as she can remember. Lately it has been impacting her sleep and has caused frequent headaches that limit her quality of life. She experiences radiating pain down bilateral arms.   PAIN: neck:  Worst pain: 10/10 occurs with migraines Best pain: 8/10 Current pain: 9/10  Pain worsens: sleeping Pain better: heating pad, occasionally cold,  Massage therapist distraction  POSTURE: Forward head rounded shoulders; shoulder elevation with scapular protraction  PROM/AROM:   Seated AROM Right Left  Flexion 3: spasm and pain  Extension 15  Side Bending 2* 4*  Rotation 20* 30*  * pain  Most painful motion is flexing head   arom supine Right Left  Flexion   Extension   Side Bending 3* 6*  Rotation 10* 20*   Limited tolerance of PROM cervical ROM   Shoulders: WFL AROM: hx of rotator in R and L shoulder   Scapular dyskinesia noted   Mobilizations: limited tolerance of cervical spine resulting in empty feel with grade I and  hypomobility  STRENGTH:  Graded on a 0-5 scale Unable to manual muscle test cervical spine due to pain  SENSATION: Numbness in hands   SPECIAL TESTS: Alar ligament testing - -upper motor neuron testing Modified Sharp purser -: guarding  Press down painful: guarding Upper trap test: + bilaterally  spurlings test: + Cervical reduction test +  Cervical radiculopathy test cluster: Four criteria: cervical rotation <60 degrees, +spurlings test + distraction test and + upper limb nerve tension test (median bias) =Patient has 4 of 4 tests positive SP 99, +LR 30.3   NDI:54 %= severe perceived disability    Manual:  STM to upper trap, levator scap, SCM,  and neighboring musculature x 8 minutes with multiple muscle tissue tension/knots present.  : mobilizations of first rib: hypomobile    [facility] PT Assessment - 07/15/19 0001      Berg Balance Test   Sit to Stand  Able to stand without using hands and stabilize independently    Standing Unsupported  Able to stand safely 2 minutes    Sitting with Back Unsupported but Feet Supported on Floor or Stool  Able to sit safely and securely 2 minutes    Stand to Sit  Sits safely with minimal use of hands    Transfers  Able to transfer safely, minor use of hands    Standing Unsupported with Eyes Closed  Able to stand 10 seconds safely    Standing Unsupported with Feet Together  Able to place feet together independently and stand 1 minute safely    From Standing, Reach Forward with Outstretched Arm  Can reach forward >12 cm safely (5")    From Standing Position, Pick up Object from Floor  Able to pick up shoe safely and easily    From Standing Position, Turn to Look Behind Over each Shoulder  Looks behind from both sides and weight shifts well    Turn 360 Degrees  Able to turn 360 degrees safely one side only in 4 seconds or less    Standing Unsupported, Alternately Place Feet on Step/Stool  Able to complete 4 steps without aid or supervision     Standing Unsupported, One Foot in Front  Able to take small step independently and hold 30 seconds    Standing on One Leg  Tries to lift leg/unable to hold 3 seconds but remains standing independently    Total Score  47     see goals for updated goals and assessment of current goals.                       PT Education - 07/15/19 1747    Education Details  neck POC    Person(s) Educated  Patient    Methods  Explanation;Demonstration;Tactile cues;Verbal cues    Comprehension  Verbalized understanding;Returned demonstration;Verbal cues required;Tactile cues required       PT Short Term Goals - 07/15/19 1752      PT SHORT TERM GOAL #1   Title  Patient will be independent in home exercise program to improve strength/mobility for better functional independence with ADLs.    Baseline  7/6: HEP given 9/2: HEp compliant    Time  2    Period  Weeks    Status  Partially Met    Target Date  07/29/19        PT Long Term Goals - 07/15/19 1752      PT LONG TERM GOAL #1   Title  Patient will increase Berg Balance score by > 6 points (53/56) to demonstrate decreased fall risk during functional activities.    Baseline  7/6: 40/56 9/2: 47% (met goal, revising to be more challenging)    Time  8    Period  Weeks    Status  Revised    Target Date  09/09/19      PT LONG TERM GOAL #2   Title  Patient will increase ABC scale score >80% to demonstrate better functional mobility and better confidence with ADLs.    Baseline  7/6: 40/56    Time  8    Period  Weeks    Status  On-going  Target Date  09/09/19      PT LONG TERM GOAL #3   Title  Patient will perform a squat and/or a lunge without using UE assistance or losing balance to return to previous exercise program.    Baseline  7/6: loses balance upon attempt 9/2: able to perform squats, lunges are more challenging to but does not lose balance now    Time  8    Period  Weeks    Status  Achieved      PT LONG TERM GOAL  #4   Title  Patient will be independent in walking on even/uneven surface with least restrictive assistive device, for 20+ minutes without rest break, reporting some difficulty or less to improve walking tolerance with community ambulation including grocery shopping, going to church,etc.    Baseline  7/6: unable to walk for long durations without severe pain 9/2: increase in pain by +3/10 however is able to perform    Time  8    Period  Weeks    Status  Partially Met    Target Date  09/09/19      PT LONG TERM GOAL #5   Title  Patient will report a worst pain of 5/10 on VAS in cervical spine to improve tolerance with ADLs and reduced symptoms with activities.    Baseline  9/2: 10/10    Time  8    Period  Weeks    Status  New    Target Date  09/09/19      Additional Long Term Goals   Additional Long Term Goals  Yes      PT LONG TERM GOAL #6   Title  Patient will decrease NDI to <30% for decreased percieved disability from severe to mild for improved quality of life.    Baseline  9/2: 54%    Time  8    Period  Weeks    Status  New    Target Date  09/09/19            Plan - 07/15/19 1750    Clinical Impression Statement  Patient has received a new order for cervical pain. Full evaluation of cervical spine limited by muscle guarding and high pain levels. Patient has limited muscle tissue length of cervical musculature with excessive forward head rounded shoulders positioning. Limited ROM in all directions noted with patient fearful of movement due to fear of pain. NDI scoring of 54% indicates high perceived disability. Due to patient testing positive on all four measures of cervical radiculopathy cluster it is very likely patient presents with cervical radiculopathy at this time. Patient would benefit from cervical pain to be added to current POC in addition to current POC. Patient will benefit from skilled physical therapy to decrease pain levels, improve coordination and posture for  improved quality of life.    Personal Factors and Comorbidities  Comorbidity 3+;Fitness;Past/Current Experience;Profession;Social Background;Time since onset of injury/illness/exacerbation    Comorbidities  cervical cancer, bilateral carpal tunnel syndrome, polyneuropathy, depression, migraines, VSD, ADD, anxiety, GERD    Examination-Activity Limitations  Bend;Caring for Others;Carry;Dressing;Lift;Locomotion Level;Reach Overhead;Squat;Stairs;Stand;Toileting;Transfers    Examination-Participation Restrictions  Church;Cleaning;Community Activity;Driving;Interpersonal Relationship;Laundry;Shop;Volunteer;Yard Work    [employer] officer  Evolving/Moderate complexity    Rehab Potential  Fair    PT Frequency  2x / week    PT Duration  8 weeks    PT Treatment/Interventions  ADLs/Self Care Home Management;Cryotherapy;Electrical Stimulation;Iontophoresis '4mg'$ /ml Dexamethasone;Moist Heat;Traction;Ultrasound;DME Instruction;Gait training;Stair training;Functional mobility training;Therapeutic activities;Therapeutic exercise;Balance training;Neuromuscular re-education;Manual techniques;Passive range of  motion;Dry needling;Vestibular;Taping;Splinting;Energy conservation;Patient/family education;Biofeedback;Vasopneumatic Device;Spinal Manipulations;Joint Manipulations    PT Next Visit Plan  cervical distraction    PT Home Exercise Plan  see above    Consulted and Agree with Plan of Care  Patient       Patient will benefit from skilled therapeutic intervention in order to improve the following deficits and impairments:  Abnormal gait, Decreased activity tolerance, Decreased balance, Decreased endurance, Decreased coordination, Decreased mobility, Difficulty walking, Decreased strength, Impaired flexibility, Impaired perceived functional ability, Impaired sensation, Postural dysfunction, Improper body mechanics, Pain  Visit Diagnosis: Unsteadiness on feet  Other abnormalities of gait and  mobility  Cervicalgia  Radiculopathy, cervical region     Problem List Patient Active Problem List   Diagnosis Date Noted  . GAD (generalized anxiety disorder) 06/22/2019  . Insomnia due to medical condition 06/22/2019  . Needle phobia 06/22/2019  . Bilateral carpal tunnel syndrome 08/27/2018  . Intractable migraine with aura without status migrainosus 08/27/2018  . Polyneuropathy 08/27/2018  . Cervical cancer ([place]) 08/18/2018  . Chest pain 09/23/2017  . Elevated TSH 07/03/2017  . Menorrhagia with irregular cycle 09/17/2016  . Chronic idiopathic urticaria 09/03/2016  . Postoperative state 08/20/2016  . Calcaneal spur 07/10/2016  . On prednisone therapy 06/20/2016  . Allergic rhinitis 04/17/2016  . Allergic urticaria due to ingested food 02/09/2016  . Heavy drinker 04/11/2014  . Depression 09/30/2013  . Irregular bleeding 09/30/2013  . Migraine headache 09/30/2013  . Excess weight 09/30/2013  . Frequent UTI 09/30/2013  . Infection of urinary tract 09/30/2013  . VSD (ventricular septal defect and aortic arch hypoplasia 08/05/2013  . Abnormal weight gain 08/05/2013   Janna Arch, PT, DPT   07/15/2019, 5:59 PM  [place] MAIN [facility] SERVICES [address] [place], [place], [address] Phone: [phone]   Fax:  [phone]  Name: Kristine [name] MRN: [id number] Date of Birth: July 08, 1971

## 2019-07-22 ENCOUNTER — Other Ambulatory Visit: Payer: Self-pay

## 2019-07-22 ENCOUNTER — Ambulatory Visit: Payer: 59

## 2019-07-22 DIAGNOSIS — R2689 Other abnormalities of gait and mobility: Secondary | ICD-10-CM

## 2019-07-22 DIAGNOSIS — M5412 Radiculopathy, cervical region: Secondary | ICD-10-CM

## 2019-07-22 DIAGNOSIS — R2681 Unsteadiness on feet: Secondary | ICD-10-CM

## 2019-07-22 DIAGNOSIS — M542 Cervicalgia: Secondary | ICD-10-CM

## 2019-07-22 NOTE — Therapy (Signed)
[place] MAIN [facility] SERVICES [address] [place], [place], [address] Phone: [phone]   Fax:  [phone]  Physical Therapy Treatment Physical Therapy Progress Note   Dates of reporting period  05/18/19  to   07/22/19   Patient Details  Name: Kristine [name] MRN: [id number] Date of Birth: 20-Nov-1970 Referring Provider (PT): hemang shah    Encounter Date: 07/22/2019  PT End of Session - 07/22/19 1719    Visit Number  10    Number of Visits  25    Date for PT Re-Evaluation  09/09/19    Authorization Type  10/10 ; next session 1/10 PN on 07/22/19    PT Start Time  1644    PT Stop Time  1729    PT Time Calculation (min)  45 min    Equipment Utilized During Treatment  --   during stair training   Activity Tolerance  Patient tolerated treatment well;Patient limited by pain    Behavior During Therapy  WFL for tasks assessed/performed       Past Medical History:  Diagnosis Date  . Abnormal weight gain 08/05/2013  . Absence of interventricular septum 08/05/2013  . Allergic rhinitis 04/17/2016  . Allergic urticaria due to ingested food 02/09/2016  . Anxiety   . Asthma   . Clinical depression 09/30/2013  . Complication of anesthesia    PT HAD TROUBLE BREATHING AFTER HYSTERECTOMY IN OCTOBER  . Depression   . Dizziness   . Excess weight 09/30/2013  . GERD (gastroesophageal reflux disease)   . Headache, migraine 09/30/2013  . Heart murmur   . Heavy drinker 04/11/2014   Overview:  Patient reports drinking 2-3 bottles of wine each weekend.    . History of methicillin resistant staphylococcus aureus (MRSA) 11/2015  . Infection of urinary tract 09/30/2013  . Irregular bleeding 09/30/2013  . Multiple allergies   . VSD (ventricular septal defect and aortic arch hypoplasia     Past Surgical History:  Procedure Laterality Date  . BOTOX INJECTION N/A 10/23/2016   Procedure: BOTOX INJECTION;  Surgeon: Royston Cowper, MD;  Location: [facility];   Service: Urology;  Laterality: N/A;  . BREAST BIOPSY Left 11/13/13   benign  . CESAREAN SECTION    . COLONOSCOPY    . CORONARY ANGIOPLASTY    . CYSTOSCOPY N/A 10/23/2016   Procedure: CYSTOSCOPY;  Surgeon: Royston Cowper, MD;  Location: [facility];  Service: Urology;  Laterality: N/A;  . CYSTOSCOPY WITH HYDRODISTENSION AND BIOPSY    . LAPAROSCOPIC ASSISTED VAGINAL HYSTERECTOMY N/A 08/20/2016   Procedure: LAPAROSCOPIC ASSISTED VAGINAL HYSTERECTOMY;  Surgeon: Boykin Nearing, MD;  Location: [facility];  Service: Gynecology;  Laterality: N/A;  . LAPAROSCOPIC BILATERAL SALPINGECTOMY Bilateral 08/20/2016   Procedure: LAPAROSCOPIC BILATERAL SALPINGECTOMY;  Surgeon: Boykin Nearing, MD;  Location: [facility];  Service: Gynecology;  Laterality: Bilateral;  . NASAL SINUS SURGERY     x12    There were no vitals filed for this visit.  Subjective Assessment - 07/22/19 1718    Subjective  Patient returning from beach trip with high pain levels. Was unable to go to work this morning due to pain. Is getting a massage tomorrow. Walked on [employer] a lot when on vacation.    Pertinent History  Patient is a pleasant 48 year old female who presents for balance training/polyneuropathy. PMH includes VSD(ventricular septal defect), allergic rhinitis, UTI, depression, migraines, polyneuropathy, bilateral carpal tunnel syndrome, ADD, anxiety, basal cell carcinoma, fibroids, GERD,  and two children by C section.Saw PT in September 2019 at a different facility for plantar fasciitis but reports they were not able to help.  Patient frequently loses balance but does not fall to the ground as she is able to catch herself. She cannot squat or lunge now without losing balance and wants to be able to return to these activities. 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    Limitations  Sitting;Lifting;Standing;Walking;House hold activities;Other (comment)    How long can you sit comfortably?  limited by ADHD not by  pain    How long can you stand comfortably?  stands all day for work: hairdresser    How long can you walk comfortably?  hurts her feet to walk immediately    Patient Stated Goals  to be able to do a squat or lunge without falling over.    Currently in Pain?  Yes    Pain Score  9     Pain Location  Back    Pain Orientation  Upper;Mid;Lower    Pain Descriptors / Indicators  Stabbing;Shooting    Pain Type  Chronic pain;Neuropathic pain    Pain Radiating Towards  radiating from cervical spine down lumbar spine into legs    Pain Onset  More than a month ago    Pain Frequency  Constant      Treatment:   Grade II-III mobilizations lumbar spine 10 seconds x2 trials each segment Grade II-III mobilizations thoracic spine and costal joints 10 seconds x 3 trials for postural correction; J mobilization in inferior direction 20 seconds x 3 trials  Grade III inferior mobilizations to sacrum and lateral crests 20 seconds x 3 trials each position Grade I-II cervical mobilizations 10 seconds x 3 trials each level  Traction cervical: 30 on 10 off, 20 pull, 10 release, 10 minutes  Supine posterior pelvic tilts 10x 3 second holds.   Review of new cervical HEP:    Access Code: [email or web address]  URL: [email or web address]  Date: 07/22/2019  Prepared by: Janna Arch   Exercises . Supine Chin Tuck - 10 reps - 2 sets - 5 hold - 1x daily - 7x weekly . Seated Assisted Cervical Rotation with Towel - 10 reps - 2 sets - 5 hold - 1x daily - 7x weekly . Sternocleidomastoid Stretch - 10 reps - 2 sets - 5 hold - 1x daily - 7x weekly . Cervical Extension and Sidebending AROM with Strap - 10 reps - 2 sets - 5 hold - 1x daily - 7x weekly . Standing 'L' Stretch at [employer] - 10 reps - 2 sets - 5 hold - 1x daily - 7x weekly Doorway Pec Stretch at 60 Elevation - 2 reps - 2 sets - 30 hold - 1x daily - 7x weekly      Patient's condition has the potential to improve in response to therapy. Maximum improvement  is yet to be obtained. The anticipated improvement is attainable and reasonable in a generally predictable time.  Patient reports her cervical pain is most limiting at this time due to it radiating down her body.                      PT Education - 07/22/19 1640    Education Details  cervical HEP, disraction, manual    Person(s) Educated  Patient    Methods  Explanation;Demonstration;Tactile cues;Verbal cues    Comprehension  Verbalized understanding;Returned demonstration;Verbal cues required;Tactile cues required;Need further instruction  PT Short Term Goals - 07/15/19 1752      PT SHORT TERM GOAL #1   Title  Patient will be independent in home exercise program to improve strength/mobility for better functional independence with ADLs.    Baseline  7/6: HEP given 9/2: HEp compliant    Time  2    Period  Weeks    Status  Partially Met    Target Date  07/29/19        PT Long Term Goals - 07/15/19 1752      PT LONG TERM GOAL #1   Title  Patient will increase Berg Balance score by > 6 points (53/56) to demonstrate decreased fall risk during functional activities.    Baseline  7/6: 40/56 9/2: 47% (met goal, revising to be more challenging)    Time  8    Period  Weeks    Status  Revised    Target Date  09/09/19      PT LONG TERM GOAL #2   Title  Patient will increase ABC scale score >80% to demonstrate better functional mobility and better confidence with ADLs.    Baseline  7/6: 40/56    Time  8    Period  Weeks    Status  On-going    Target Date  09/09/19      PT LONG TERM GOAL #3   Title  Patient will perform a squat and/or a lunge without using UE assistance or losing balance to return to previous exercise program.    Baseline  7/6: loses balance upon attempt 9/2: able to perform squats, lunges are more challenging to but does not lose balance now    Time  8    Period  Weeks    Status  Achieved      PT LONG TERM GOAL #4   Title  Patient will be  independent in walking on even/uneven surface with least restrictive assistive device, for 20+ minutes without rest break, reporting some difficulty or less to improve walking tolerance with community ambulation including grocery shopping, going to church,etc.    Baseline  7/6: unable to walk for long durations without severe pain 9/2: increase in pain by +3/10 however is able to perform    Time  8    Period  Weeks    Status  Partially Met    Target Date  09/09/19      PT LONG TERM GOAL #5   Title  Patient will report a worst pain of 5/10 on VAS in cervical spine to improve tolerance with ADLs and reduced symptoms with activities.    Baseline  9/2: 10/10    Time  8    Period  Weeks    Status  New    Target Date  09/09/19      Additional Long Term Goals   Additional Long Term Goals  Yes      PT LONG TERM GOAL #6   Title  Patient will decrease NDI to <30% for decreased percieved disability from severe to mild for improved quality of life.    Baseline  9/2: 54%    Time  8    Period  Weeks    Status  New    Target Date  09/09/19            Plan - 07/22/19 1720    Clinical Impression Statement  Patient was recerted last session (07/15/19) please refer to this note for further details about progression, new  referral for cervical spine also addressed during that session. Patient presents with highest pain level in a long time potentially due to prolonged period of time at the beach and week without therapy. Traction and cervical spine HEP performed this session with slight reduction of symptoms noted.  Patient's condition has the potential to improve in response to therapy. Maximum improvement is yet to be obtained. The anticipated improvement is attainable and reasonable in a generally predictable time. Patient will benefit from skilled physical therapy to decrease pain levels, improve coordination and posture for improved quality of life.    Personal Factors and Comorbidities  Comorbidity  3+;Fitness;Past/Current Experience;Profession;Social Background;Time since onset of injury/illness/exacerbation    Comorbidities  cervical cancer, bilateral carpal tunnel syndrome, polyneuropathy, depression, migraines, VSD, ADD, anxiety, GERD    Examination-Activity Limitations  Bend;Caring for Others;Carry;Dressing;Lift;Locomotion Level;Reach Overhead;Squat;Stairs;Stand;Toileting;Transfers    Examination-Participation Restrictions  Church;Cleaning;Community Activity;Driving;Interpersonal Relationship;Laundry;Shop;Volunteer;Yard Work    [employer] officer  Evolving/Moderate complexity    Rehab Potential  Fair    PT Frequency  2x / week    PT Duration  8 weeks    PT Treatment/Interventions  ADLs/Self Care Home Management;Cryotherapy;Electrical Stimulation;Iontophoresis 38m/ml Dexamethasone;Moist Heat;Traction;Ultrasound;DME Instruction;Gait training;Stair training;Functional mobility training;Therapeutic activities;Therapeutic exercise;Balance training;Neuromuscular re-education;Manual techniques;Passive range of motion;Dry needling;Vestibular;Taping;Splinting;Energy conservation;Patient/family education;Biofeedback;Vasopneumatic Device;Spinal Manipulations;Joint Manipulations    PT Next Visit Plan  cervical distraction    PT Home Exercise Plan  see above    Consulted and Agree with Plan of Care  Patient       Patient will benefit from skilled therapeutic intervention in order to improve the following deficits and impairments:  Abnormal gait, Decreased activity tolerance, Decreased balance, Decreased endurance, Decreased coordination, Decreased mobility, Difficulty walking, Decreased strength, Impaired flexibility, Impaired perceived functional ability, Impaired sensation, Postural dysfunction, Improper body mechanics, Pain  Visit Diagnosis: Unsteadiness on feet  Other abnormalities of gait and mobility  Cervicalgia  Radiculopathy, cervical region     Problem  List Patient Active Problem List   Diagnosis Date Noted  . GAD (generalized anxiety disorder) 06/22/2019  . Insomnia due to medical condition 06/22/2019  . Needle phobia 06/22/2019  . Bilateral carpal tunnel syndrome 08/27/2018  . Intractable migraine with aura without status migrainosus 08/27/2018  . Polyneuropathy 08/27/2018  . Cervical cancer ([facility] 08/18/2018  . Chest pain 09/23/2017  . Elevated TSH 07/03/2017  . Menorrhagia with irregular cycle 09/17/2016  . Chronic idiopathic urticaria 09/03/2016  . Postoperative state 08/20/2016  . Calcaneal spur 07/10/2016  . On prednisone therapy 06/20/2016  . Allergic rhinitis 04/17/2016  . Allergic urticaria due to ingested food 02/09/2016  . Heavy drinker 04/11/2014  . Depression 09/30/2013  . Irregular bleeding 09/30/2013  . Migraine headache 09/30/2013  . Excess weight 09/30/2013  . Frequent UTI 09/30/2013  . Infection of urinary tract 09/30/2013  . VSD (ventricular septal defect and aortic arch hypoplasia 08/05/2013  . Abnormal weight gain 08/05/2013   MJanna Arch PT, DPT   07/22/2019, 5:35 PM  [place] [facility]SERV[facility] [address] [address]Phone: [phone]  Fax:  [phone] Name: SLADAVIA LINDENBAUMMRN: [id number] of Birth: [birth year]

## 2019-07-22 NOTE — Patient Instructions (Signed)
Access Code: [email or web address]  URL: [email or web address]  Date: 07/22/2019  Prepared by: Janna Arch   Exercises . Supine Chin Tuck - 10 reps - 2 sets - 5 hold - 1x daily - 7x weekly . Seated Assisted Cervical Rotation with Towel - 10 reps - 2 sets - 5 hold - 1x daily - 7x weekly . Sternocleidomastoid Stretch - 10 reps - 2 sets - 5 hold - 1x daily - 7x weekly . Cervical Extension and Sidebending AROM with Strap - 10 reps - 2 sets - 5 hold - 1x daily - 7x weekly . Standing 'L' Stretch at [employer] - 10 reps - 2 sets - 5 hold - 1x daily - 7x weekly . Doorway Pec Stretch at 60 Elevation - 2 reps - 2 sets - 30 hold - 1x daily - 7x weekly

## 2019-07-27 ENCOUNTER — Ambulatory Visit: Payer: 59

## 2019-07-27 ENCOUNTER — Other Ambulatory Visit: Payer: Self-pay

## 2019-07-27 DIAGNOSIS — R2681 Unsteadiness on feet: Secondary | ICD-10-CM | POA: Diagnosis not present

## 2019-07-27 DIAGNOSIS — M542 Cervicalgia: Secondary | ICD-10-CM

## 2019-07-27 DIAGNOSIS — M5412 Radiculopathy, cervical region: Secondary | ICD-10-CM

## 2019-07-27 DIAGNOSIS — R2689 Other abnormalities of gait and mobility: Secondary | ICD-10-CM

## 2019-07-27 NOTE — Therapy (Signed)
[place] MAIN [facility] SERVICES [address] [place], [place], [address] Phone: [phone]   Fax:  [phone]  Physical Therapy Treatment  Patient Details  Name: Kristine [name] MRN: [id number] Date of Birth: 1971-06-25 Referring Provider (PT): hemang shah    Encounter Date: 07/27/2019  PT End of Session - 07/27/19 0844    Visit Number  11    Number of Visits  25    Date for PT Re-Evaluation  09/09/19    Authorization Type  1/10 PN on 07/22/19    PT Start Time  0803    PT Stop Time  0847    PT Time Calculation (min)  44 min    Equipment Utilized During Treatment  --   during stair training   Activity Tolerance  Patient tolerated treatment well;Patient limited by pain    Behavior During Therapy  [facility] for tasks assessed/performed       Past Medical History:  Diagnosis Date  . Abnormal weight gain 08/05/2013  . Absence of interventricular septum 08/05/2013  . Allergic rhinitis 04/17/2016  . Allergic urticaria due to ingested food 02/09/2016  . Anxiety   . Asthma   . Clinical depression 09/30/2013  . Complication of anesthesia    PT HAD TROUBLE BREATHING AFTER HYSTERECTOMY IN OCTOBER  . Depression   . Dizziness   . Excess weight 09/30/2013  . GERD (gastroesophageal reflux disease)   . Headache, migraine 09/30/2013  . Heart murmur   . Heavy drinker 04/11/2014   Overview:  Patient reports drinking 2-3 bottles of wine each weekend.    . History of methicillin resistant staphylococcus aureus (MRSA) 11/2015  . Infection of urinary tract 09/30/2013  . Irregular bleeding 09/30/2013  . Multiple allergies   . VSD (ventricular septal defect and aortic arch hypoplasia     Past Surgical History:  Procedure Laterality Date  . BOTOX INJECTION N/A 10/23/2016   Procedure: BOTOX INJECTION;  Surgeon: Royston Cowper, MD;  Location: [facility] ORS;  Service: Urology;  Laterality: N/A;  . BREAST BIOPSY Left 11/13/13   benign  . CESAREAN SECTION    .  COLONOSCOPY    . CORONARY ANGIOPLASTY    . CYSTOSCOPY N/A 10/23/2016   Procedure: CYSTOSCOPY;  Surgeon: Royston Cowper, MD;  Location: [facility] ORS;  Service: Urology;  Laterality: N/A;  . CYSTOSCOPY WITH HYDRODISTENSION AND BIOPSY    . LAPAROSCOPIC ASSISTED VAGINAL HYSTERECTOMY N/A 08/20/2016   Procedure: LAPAROSCOPIC ASSISTED VAGINAL HYSTERECTOMY;  Surgeon: Boykin Nearing, MD;  Location: [facility] ORS;  Service: Gynecology;  Laterality: N/A;  . LAPAROSCOPIC BILATERAL SALPINGECTOMY Bilateral 08/20/2016   Procedure: LAPAROSCOPIC BILATERAL SALPINGECTOMY;  Surgeon: Boykin Nearing, MD;  Location: [facility] ORS;  Service: Gynecology;  Laterality: Bilateral;  . NASAL SINUS SURGERY     x12    There were no vitals filed for this visit.  Subjective Assessment - 07/27/19 0841    Subjective  Patient went to beach over the weekend again. Reports her vertigo continues to be challenging and resulting in excessive dizziness. Patient reports high pain following beach trip.    Pertinent History  Patient is a pleasant 48 year old female who presents for balance training/polyneuropathy. PMH includes VSD(ventricular septal defect), allergic rhinitis, UTI, depression, migraines, polyneuropathy, bilateral carpal tunnel syndrome, ADD, anxiety, basal cell carcinoma, fibroids, GERD, and two children by C section.Saw PT in September 2019 at a different facility for plantar fasciitis but reports they were not able to help.  Patient frequently loses  balance but does not fall to the ground as she is able to catch herself. She cannot squat or lunge now without losing balance and wants to be able to return to these activities. 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    Limitations  Sitting;Lifting;Standing;Walking;House hold activities;Other (comment)    How long can you sit comfortably?  limited by ADHD not by pain    How long can you stand comfortably?  stands all day for work: hairdresser    How long can you  walk comfortably?  hurts her feet to walk immediately    Patient Stated Goals  to be able to do a squat or lunge without falling over.    Currently in Pain?  Yes    Pain Score  9     Pain Location  Back    Pain Orientation  Mid;Lower;Upper    Pain Descriptors / Indicators  Constant;Tightness    Pain Type  Chronic pain;Neuropathic pain    Pain Onset  More than a month ago    Pain Frequency  Constant       Patient reports she continues to have vertigo.     Treatment:    Grade II-III mobilizations lumbar spine 10 seconds x2 trials each segment Grade II-III mobilizations thoracic spine and costal joints 10 seconds x 3 trials for postural correction; J mobilization in inferior direction 20 seconds x 3 trials  Grade III inferior mobilizations to sacrum and lateral crests 20 seconds x 3 trials each position Grade I-II cervical mobilizations 10 seconds x 3 trials each level   Ultrasound 6 minutes with small radius head: 1MHz, continuous, 100% duty cycle, warming head: lower cervical region/upper thoracic  Traction cervical: 30 on 10 off, 25 pull, 10 release, 10 minutes   Supine posterior pelvic tilts 10x 3 second holds.                     PT Education - 07/27/19 0843    Education Details  manual, posture, body mechanics    Person(s) Educated  Patient    Methods  Explanation;Demonstration;Tactile cues;Verbal cues    Comprehension  Verbalized understanding;Other (comment);Returned demonstration;Verbal cues required;Tactile cues required       PT Short Term Goals - 07/15/19 1752      PT SHORT TERM GOAL #1   Title  Patient will be independent in home exercise program to improve strength/mobility for better functional independence with ADLs.    Baseline  7/6: HEP given 9/2: HEp compliant    Time  2    Period  Weeks    Status  Partially Met    Target Date  07/29/19        PT Long Term Goals - 07/15/19 1752      PT LONG TERM GOAL #1   Title  Patient will  increase Berg Balance score by > 6 points (53/56) to demonstrate decreased fall risk during functional activities.    Baseline  7/6: 40/56 9/2: 47% (met goal, revising to be more challenging)    Time  8    Period  Weeks    Status  Revised    Target Date  09/09/19      PT LONG TERM GOAL #2   Title  Patient will increase ABC scale score >80% to demonstrate better functional mobility and better confidence with ADLs.    Baseline  7/6: 40/56    Time  8    Period  Weeks  Status  On-going    Target Date  09/09/19      PT LONG TERM GOAL #3   Title  Patient will perform a squat and/or a lunge without using UE assistance or losing balance to return to previous exercise program.    Baseline  7/6: loses balance upon attempt 9/2: able to perform squats, lunges are more challenging to but does not lose balance now    Time  8    Period  Weeks    Status  Achieved      PT LONG TERM GOAL #4   Title  Patient will be independent in walking on even/uneven surface with least restrictive assistive device, for 20+ minutes without rest break, reporting some difficulty or less to improve walking tolerance with community ambulation including grocery shopping, going to church,etc.    Baseline  7/6: unable to walk for long durations without severe pain 9/2: increase in pain by +3/10 however is able to perform    Time  8    Period  Weeks    Status  Partially Met    Target Date  09/09/19      PT LONG TERM GOAL #5   Title  Patient will report a worst pain of 5/10 on VAS in cervical spine to improve tolerance with ADLs and reduced symptoms with activities.    Baseline  9/2: 10/10    Time  8    Period  Weeks    Status  New    Target Date  09/09/19      Additional Long Term Goals   Additional Long Term Goals  Yes      PT LONG TERM GOAL #6   Title  Patient will decrease NDI to <30% for decreased percieved disability from severe to mild for improved quality of life.    Baseline  9/2: 54%    Time  8     Period  Weeks    Status  New    Target Date  09/09/19            Plan - 07/27/19 0844    Clinical Impression Statement  Patient presents with continued cervical and low back pain. Was at the beach over the weekend again and performed some but not all HEP. Excessive kyphosis of thoracic spine and hypomobility of cervical spine is aggravated by postural muscle limited length  And guarding resulting in an aggressive forward head rounded shoulders position. Session today focused on pain reduction/symptom relief. Patient will benefit from skilled physical therapy to decrease pain levels, improve coordination and posture for improved quality of life.    Personal Factors and Comorbidities  Comorbidity 3+;Fitness;Past/Current Experience;Profession;Social Background;Time since onset of injury/illness/exacerbation    Comorbidities  cervical cancer, bilateral carpal tunnel syndrome, polyneuropathy, depression, migraines, VSD, ADD, anxiety, GERD    Examination-Activity Limitations  Bend;Caring for Others;Carry;Dressing;Lift;Locomotion Level;Reach Overhead;Squat;Stairs;Stand;Toileting;Transfers    Examination-Participation Restrictions  Church;Cleaning;Community Activity;Driving;Interpersonal Relationship;Laundry;Shop;Volunteer;Yard Work    [employer]chant [employer] officer  Evolving/Moderate complexity    Rehab Potential  Fair    PT Frequency  2x / week    PT Duration  8 weeks    PT Treatment/Interventions  ADLs/Self Care Home Management;Cryotherapy;Electrical Stimulation;Iontophoresis 4m/ml Dexamethasone;Moist Heat;Traction;Ultrasound;DME Instruction;Gait training;Stair training;Functional mobility training;Therapeutic activities;Therapeutic exercise;Balance training;Neuromuscular re-education;Manual techniques;Passive range of motion;Dry needling;Vestibular;Taping;Splinting;Energy conservation;Patient/family education;Biofeedback;Vasopneumatic Device;Spinal Manipulations;Joint Manipulations    PT Next  Visit Plan  cervical distraction    PT Home Exercise Plan  see above    Consulted and Agree with Plan of Care  Patient       Patient will benefit from skilled therapeutic intervention in order to improve the following deficits and impairments:  Abnormal gait, Decreased activity tolerance, Decreased balance, Decreased endurance, Decreased coordination, Decreased mobility, Difficulty walking, Decreased strength, Impaired flexibility, Impaired perceived functional ability, Impaired sensation, Postural dysfunction, Improper body mechanics, Pain  Visit Diagnosis: Unsteadiness on feet  Other abnormalities of gait and mobility  Cervicalgia  Radiculopathy, cervical region     Problem List Patient Active Problem List   Diagnosis Date Noted  . GAD (generalized anxiety disorder) 06/22/2019  . Insomnia due to medical condition 06/22/2019  . Needle phobia 06/22/2019  . Bilateral carpal tunnel syndrome 08/27/2018  . Intractable migraine with aura without status migrainosus 08/27/2018  . Polyneuropathy 08/27/2018  . Cervical cancer ([place]) 08/18/2018  . Chest pain 09/23/2017  . Elevated TSH 07/03/2017  . Menorrhagia with irregular cycle 09/17/2016  . Chronic idiopathic urticaria 09/03/2016  . Postoperative state 08/20/2016  . Calcaneal spur 07/10/2016  . On prednisone therapy 06/20/2016  . Allergic rhinitis 04/17/2016  . Allergic urticaria due to ingested food 02/09/2016  . Heavy drinker 04/11/2014  . Depression 09/30/2013  . Irregular bleeding 09/30/2013  . Migraine headache 09/30/2013  . Excess weight 09/30/2013  . Frequent UTI 09/30/2013  . Infection of urinary tract 09/30/2013  . VSD (ventricular septal defect and aortic arch hypoplasia 08/05/2013  . Abnormal weight gain 08/05/2013   Janna Arch, PT, DPT   07/27/2019, 8:51 AM  [place] MAIN [facility] SERVICES [address] [place], [place], [address] Phone: [phone]   Fax:   [phone]  Name: Kristine [name] MRN: [id number] Date of Birth: 17-Jan-1971

## 2019-07-28 ENCOUNTER — Encounter: Payer: 59 | Admitting: Physical Therapy

## 2019-07-29 ENCOUNTER — Ambulatory Visit: Payer: 59 | Admitting: Physical Therapy

## 2019-07-29 ENCOUNTER — Other Ambulatory Visit: Payer: Self-pay

## 2019-07-29 ENCOUNTER — Encounter: Payer: Self-pay | Admitting: Physical Therapy

## 2019-07-29 DIAGNOSIS — M5412 Radiculopathy, cervical region: Secondary | ICD-10-CM

## 2019-07-29 DIAGNOSIS — R2681 Unsteadiness on feet: Secondary | ICD-10-CM | POA: Diagnosis not present

## 2019-07-29 DIAGNOSIS — M542 Cervicalgia: Secondary | ICD-10-CM

## 2019-07-29 DIAGNOSIS — R2689 Other abnormalities of gait and mobility: Secondary | ICD-10-CM

## 2019-07-29 NOTE — Therapy (Signed)
[place] MAIN [facility] SERVICES [address] [place], [place], [address] Phone: [phone]   Fax:  [phone]  Physical Therapy Treatment  Patient Details  Name: Kristine [name] MRN: [id number] Date of Birth: [birth year] Referring Provider (PT): hemang shah    Encounter Date: 07/29/2019  PT End of Session - 07/29/19 1531    Visit Number  12    Number of Visits  25    Date for PT Re-Evaluation  09/09/19    Authorization Type  1/10 PN on 07/22/19    PT Start Time  0230    PT Stop Time  0315    PT Time Calculation (min)  45 min    Equipment Utilized During Treatment  --   during stair training   Activity Tolerance  Patient tolerated treatment well;Patient limited by pain    Behavior During Therapy  [facility] for tasks assessed/performed       Past Medical History:  Diagnosis Date  . Abnormal weight gain 08/05/2013  . Absence of interventricular septum 08/05/2013  . Allergic rhinitis 04/17/2016  . Allergic urticaria due to ingested food 02/09/2016  . Anxiety   . Asthma   . Clinical depression 09/30/2013  . Complication of anesthesia    PT HAD TROUBLE BREATHING AFTER HYSTERECTOMY IN OCTOBER  . Depression   . Dizziness   . Excess weight 09/30/2013  . GERD (gastroesophageal reflux disease)   . Headache, migraine 09/30/2013  . Heart murmur   . Heavy drinker 04/11/2014   Overview:  Patient reports drinking 2-3 bottles of wine each weekend.    . History of methicillin resistant staphylococcus aureus (MRSA) 11/2015  . Infection of urinary tract 09/30/2013  . Irregular bleeding 09/30/2013  . Multiple allergies   . VSD (ventricular septal defect and aortic arch hypoplasia     Past Surgical History:  Procedure Laterality Date  . BOTOX INJECTION N/A 10/23/2016   Procedure: BOTOX INJECTION;  Surgeon: Royston Cowper, MD;  Location: [facility] ORS;  Service: Urology;  Laterality: N/A;  . BREAST BIOPSY Left 11/13/13   benign  . CESAREAN SECTION    .  COLONOSCOPY    . CORONARY ANGIOPLASTY    . CYSTOSCOPY N/A 10/23/2016   Procedure: CYSTOSCOPY;  Surgeon: Royston Cowper, MD;  Location: [facility] ORS;  Service: Urology;  Laterality: N/A;  . CYSTOSCOPY WITH HYDRODISTENSION AND BIOPSY    . LAPAROSCOPIC ASSISTED VAGINAL HYSTERECTOMY N/A 08/20/2016   Procedure: LAPAROSCOPIC ASSISTED VAGINAL HYSTERECTOMY;  Surgeon: Boykin Nearing, MD;  Location: [facility] ORS;  Service: Gynecology;  Laterality: N/A;  . LAPAROSCOPIC BILATERAL SALPINGECTOMY Bilateral 08/20/2016   Procedure: LAPAROSCOPIC BILATERAL SALPINGECTOMY;  Surgeon: Boykin Nearing, MD;  Location: [facility] ORS;  Service: Gynecology;  Laterality: Bilateral;  . NASAL SINUS SURGERY     x12    There were no vitals filed for this visit.  Subjective Assessment - 07/29/19 1437    Subjective  Patient reports that she is having 7/10 neck pain. She also reports having  low back pain that recently began.    Pertinent History  Patient is a pleasant 48 year old female who presents for balance training/polyneuropathy. PMH includes VSD(ventricular septal defect), allergic rhinitis, UTI, depression, migraines, polyneuropathy, bilateral carpal tunnel syndrome, ADD, anxiety, basal cell carcinoma, fibroids, GERD, and two children by C section.Saw PT in September 2019 at a different facility for plantar fasciitis but reports they were not able to help.  Patient frequently loses balance but does not fall to the  ground as she is able to catch herself. She cannot squat or lunge now without losing balance and wants to be able to return to these activities. 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    Limitations  Sitting;Lifting;Standing;Walking;House hold activities;Other (comment)    How long can you sit comfortably?  limited by ADHD not by pain    How long can you stand comfortably?  stands all day for work: hairdresser    How long can you walk comfortably?  hurts her feet to walk immediately    Patient  Stated Goals  to be able to do a squat or lunge without falling over.    Pain Onset  More than a month ago    Pain Onset  More than a month ago        Treatment: Cervical traction 15 lbs , 10 sec on , 10 sec off x 15 mins : patient unable to tolerate 20 lbs of traction OA release x 5 mins  Manual traction grade 2 to cervical x 2 mins  AROM in sitting rotation left and right with limited movement approximately 45 degrees with reports of 8/10 pain  Patient reports pain increased from 7/10 to 8/10 during traction and feeling nausea following OA release.                        PT Education - 07/29/19 1529    Education Details  HEP    Person(s) Educated  Patient    Methods  Explanation    Comprehension  Verbalized understanding       PT Short Term Goals - 07/15/19 1752      PT SHORT TERM GOAL #1   Title  Patient will be independent in home exercise program to improve strength/mobility for better functional independence with ADLs.    Baseline  7/6: HEP given 9/2: HEp compliant    Time  2    Period  Weeks    Status  Partially Met    Target Date  07/29/19        PT Long Term Goals - 07/15/19 1752      PT LONG TERM GOAL #1   Title  Patient will increase Berg Balance score by > 6 points (53/56) to demonstrate decreased fall risk during functional activities.    Baseline  7/6: 40/56 9/2: 47% (met goal, revising to be more challenging)    Time  8    Period  Weeks    Status  Revised    Target Date  09/09/19      PT LONG TERM GOAL #2   Title  Patient will increase ABC scale score >80% to demonstrate better functional mobility and better confidence with ADLs.    Baseline  7/6: 40/56    Time  8    Period  Weeks    Status  On-going    Target Date  09/09/19      PT LONG TERM GOAL #3   Title  Patient will perform a squat and/or a lunge without using UE assistance or losing balance to return to previous exercise program.    Baseline  7/6: loses balance upon  attempt 9/2: able to perform squats, lunges are more challenging to but does not lose balance now    Time  8    Period  Weeks    Status  Achieved      PT LONG TERM GOAL #4   Title  Patient will be independent in walking on even/uneven surface with least restrictive assistive device, for 20+ minutes without rest break, reporting some difficulty or less to improve walking tolerance with community ambulation including grocery shopping, going to [place]  7/6: unable to walk for long durations without severe pain 9/2: increase in pain by +3/10 however is able to perform    Time  8    Period  Weeks    Status  Partially Met    Target Date  09/09/19      PT LONG TERM GOAL #5   Title  Patient will report a worst pain of 5/10 on VAS in cervical spine to improve tolerance with ADLs and reduced symptoms with activities.    Baseline  9/2: 10/10    Time  8    Period  Weeks    Status  New    Target Date  09/09/19      Additional Long Term Goals   Additional Long Term Goals  Yes      PT LONG TERM GOAL #6   Title  Patient will decrease NDI to <30% for decreased percieved disability from severe to mild for improved quality of life.    Baseline  9/2: 54%    Time  8    Period  Weeks    Status  New    Target Date  09/09/19            Plan - 07/29/19 1532    Clinical Impression Statement  Patient presents with neck and back pain. She does not have guarded mobility or pain behaviors that would present with 7/10 neck pain. She denies any radiating pain to UE's. She reports that the [place] from last treatment made her pain worse and also reported pain increased from 7/10 to  8/10 following gentle cervical traction 15 lbs intermittent  and feeling nausea following OA release. She is not tolerating manual therapy . She has recently purchased a tens unit, but she has not tired it and did not bring it to learn how to use it. She reports that she can not tolerate ice. She has limited active  cervical rotation bilaterally. She does not tolerate therapy for her neck pain and would benefit from further testing from MD.    Personal Factors and Comorbidities  Comorbidity 3+;Fitness;Past/Current Experience;Profession;Social Background;Time since onset of injury/illness/exacerbation    Comorbidities  cervical cancer, bilateral carpal tunnel syndrome, polyneuropathy, depression, migraines, VSD, ADD, anxiety, GERD    Examination-Activity Limitations  Bend;Caring for Others;Carry;Dressing;Lift;Locomotion Level;Reach Overhead;Squat;Stairs;Stand;Toileting;Transfers    Examination-Participation Restrictions  Church;Cleaning;Community Activity;Driving;Interpersonal Relationship;Laundry;Shop;Volunteer;Yard Work    Merchant [employer] officer  Evolving/Moderate complexity    Rehab Potential  Fair    PT Frequency  2x / week    PT Duration  8 weeks    PT Treatment/Interventions  ADLs/Self Care Home Management;Cryotherapy;Electrical Stimulation;Iontophoresis 56m/ml Dexamethasone;Moist Heat;Traction;Ultrasound;DME Instruction;Gait training;Stair training;Functional mobility training;Therapeutic activities;Therapeutic exercise;Balance training;Neuromuscular re-education;Manual techniques;Passive range of motion;Dry needling;Vestibular;Taping;Splinting;Energy conservation;Patient/family education;Biofeedback;Vasopneumatic Device;Spinal Manipulations;Joint Manipulations    PT Next Visit Plan  cervical distraction    PT Home Exercise Plan  see above    Consulted and Agree with Plan of Care  Patient       Patient will benefit from skilled therapeutic intervention in order to improve the following deficits and impairments:  Abnormal gait, Decreased activity tolerance, Decreased balance, Decreased endurance, Decreased coordination, Decreased mobility, Difficulty walking, Decreased strength, Impaired flexibility, Impaired perceived functional ability, Impaired sensation, Postural dysfunction, Improper body  mechanics,  Pain  Visit Diagnosis: Cervicalgia  Radiculopathy, cervical region  Other abnormalities of gait and mobility  Unsteadiness on feet     Problem List Patient Active Problem List   Diagnosis Date Noted  . GAD (generalized anxiety disorder) 06/22/2019  . Insomnia due to medical condition 06/22/2019  . Needle phobia 06/22/2019  . Bilateral carpal tunnel syndrome 08/27/2018  . Intractable migraine with aura without status migrainosus 08/27/2018  . Polyneuropathy 08/27/2018  . Cervical cancer ([place]) 08/18/2018  . Chest pain 09/23/2017  . Elevated TSH 07/03/2017  . Menorrhagia with irregular cycle 09/17/2016  . Chronic idiopathic urticaria 09/03/2016  . Postoperative state 08/20/2016  . Calcaneal spur 07/10/2016  . On prednisone therapy 06/20/2016  . Allergic rhinitis 04/17/2016  . Allergic urticaria due to ingested food 02/09/2016  . Heavy drinker 04/11/2014  . Depression 09/30/2013  . Irregular bleeding 09/30/2013  . Migraine headache 09/30/2013  . Excess weight 09/30/2013  . Frequent UTI 09/30/2013  . Infection of urinary tract 09/30/2013  . VSD (ventricular septal defect and aortic arch hypoplasia 08/05/2013  . Abnormal weight gain 08/05/2013    Alanson Puls, PT DPT 07/29/2019, 3:52 PM  [place] MAIN [facility] SERVICES [address] [place], [place], [address] Phone: [phone]   Fax:  [phone]  Name: Kristine [name] MRN: [id number] Date of Birth: 04-17-1971

## 2019-08-03 ENCOUNTER — Other Ambulatory Visit: Payer: Self-pay

## 2019-08-03 DIAGNOSIS — Z20822 Contact with and (suspected) exposure to covid-19: Secondary | ICD-10-CM

## 2019-08-04 ENCOUNTER — Encounter: Payer: 59 | Admitting: Physical Therapy

## 2019-08-05 LAB — NOVEL CORONAVIRUS, NAA: SARS-CoV-2, NAA: NOT DETECTED

## 2019-08-06 ENCOUNTER — Ambulatory Visit: Payer: 59

## 2019-08-11 ENCOUNTER — Ambulatory Visit: Payer: 59 | Admitting: Physical Therapy

## 2019-08-18 ENCOUNTER — Other Ambulatory Visit: Payer: Self-pay

## 2019-08-18 ENCOUNTER — Ambulatory Visit: Payer: 59 | Attending: Neurology

## 2019-08-18 DIAGNOSIS — M542 Cervicalgia: Secondary | ICD-10-CM | POA: Insufficient documentation

## 2019-08-18 DIAGNOSIS — R2681 Unsteadiness on feet: Secondary | ICD-10-CM | POA: Diagnosis present

## 2019-08-18 DIAGNOSIS — R2689 Other abnormalities of gait and mobility: Secondary | ICD-10-CM | POA: Diagnosis present

## 2019-08-18 DIAGNOSIS — M5412 Radiculopathy, cervical region: Secondary | ICD-10-CM | POA: Diagnosis present

## 2019-08-18 NOTE — Therapy (Signed)
[place] MAIN [facility] SERVICES [address] [place], [place], [address] Phone: [phone]   Fax:  [phone]  Physical Therapy Treatment  Patient Details  Name: Kristine [name] MRN: [id number] Date of Birth: [birth year] Referring Provider (PT): hemang shah    Encounter Date: 08/18/2019  PT End of Session - 08/19/19 1247    Visit Number  13    Number of Visits  25    Date for PT Re-Evaluation  09/09/19    Authorization Type  3/10 PN on 07/22/19    PT Start Time  1644    PT Stop Time  1729    PT Time Calculation (min)  45 min    Equipment Utilized During Treatment  --   during stair training   Activity Tolerance  Patient tolerated treatment well;Patient limited by pain    Behavior During Therapy  WFL for tasks assessed/performed       Past Medical History:  Diagnosis Date  . Abnormal weight gain 08/05/2013  . Absence of interventricular septum 08/05/2013  . Allergic rhinitis 04/17/2016  . Allergic urticaria due to ingested food 02/09/2016  . Anxiety   . Asthma   . Clinical depression 09/30/2013  . Complication of anesthesia    PT HAD TROUBLE BREATHING AFTER HYSTERECTOMY IN OCTOBER  . Depression   . Dizziness   . Excess weight 09/30/2013  . GERD (gastroesophageal reflux disease)   . Headache, migraine 09/30/2013  . Heart murmur   . Heavy drinker 04/11/2014   Overview:  Patient reports drinking 2-3 bottles of wine each weekend.    . History of methicillin resistant staphylococcus aureus (MRSA) 11/2015  . Infection of urinary tract 09/30/2013  . Irregular bleeding 09/30/2013  . Multiple allergies   . VSD (ventricular septal defect and aortic arch hypoplasia     Past Surgical History:  Procedure Laterality Date  . BOTOX INJECTION N/A 10/23/2016   Procedure: BOTOX INJECTION;  Surgeon: Royston Cowper, MD;  Location: [facility] ORS;  Service: Urology;  Laterality: N/A;  . BREAST BIOPSY Left 11/13/13   benign  . CESAREAN SECTION    .  COLONOSCOPY    . CORONARY ANGIOPLASTY    . CYSTOSCOPY N/A 10/23/2016   Procedure: CYSTOSCOPY;  Surgeon: Royston Cowper, MD;  Location: [facility] ORS;  Service: Urology;  Laterality: N/A;  . CYSTOSCOPY WITH HYDRODISTENSION AND BIOPSY    . LAPAROSCOPIC ASSISTED VAGINAL HYSTERECTOMY N/A 08/20/2016   Procedure: LAPAROSCOPIC ASSISTED VAGINAL HYSTERECTOMY;  Surgeon: Boykin Nearing, MD;  Location: [facility] ORS;  Service: Gynecology;  Laterality: N/A;  . LAPAROSCOPIC BILATERAL SALPINGECTOMY Bilateral 08/20/2016   Procedure: LAPAROSCOPIC BILATERAL SALPINGECTOMY;  Surgeon: Boykin Nearing, MD;  Location: [facility] ORS;  Service: Gynecology;  Laterality: Bilateral;  . NASAL SINUS SURGERY     x12    There were no vitals filed for this visit.  Subjective Assessment - 08/19/19 1231    Subjective  Patient has not been seen in a month. Has missed due to COVID testing and missing appointments due to being sick. Has started going to the gym.    Pertinent History  Patient is a pleasant 48 year old female who presents for balance training/polyneuropathy. PMH includes VSD(ventricular septal defect), allergic rhinitis, UTI, depression, migraines, polyneuropathy, bilateral carpal tunnel syndrome, ADD, anxiety, basal cell carcinoma, fibroids, GERD, and two children by C section.Saw PT in September 2019 at a different facility for plantar fasciitis but reports they were not able to help.  Patient frequently loses  balance but does not fall to the ground as she is able to catch herself. She cannot squat or lunge now without losing balance and wants to be able to return to these activities. 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    Limitations  Sitting;Lifting;Standing;Walking;House hold activities;Other (comment)    How long can you sit comfortably?  limited by ADHD not by pain    How long can you stand comfortably?  stands all day for work: hairdresser    How long can you walk comfortably?  hurts her feet to  walk immediately    Patient Stated Goals  to be able to do a squat or lunge without falling over.    Currently in Pain?  Yes    Pain Score  8     Pain Location  Back    Pain Orientation  Lower;Upper;Mid    Pain Descriptors / Indicators  Constant;Tightness    Pain Type  Chronic pain;Neuropathic pain    Pain Radiating Towards  radiating from cervical spine down lumbar spine into legs and hands    Pain Onset  More than a month ago    Pain Frequency  Constant    Aggravating Factors   walking, standing    Pain Relieving Factors  rest    Effect of Pain on Daily Activities  limits balance and mobility        Patient has not been seen in a month. Has missed due to COVID testing and missing appointments due to being sick. Has started going to the gym.   pain. 8/10     Treatment:   Grade II-III mobilizations lumbar spine 10 seconds x2 trials each segment Grade II-III mobilizations thoracic spine and costal joints 10 seconds x 3 trials for postural correction; J mobilization in inferior direction 20 seconds x 3 trials Grade III inferior mobilizations to sacrum and lateral crests 20 seconds x 3 trials each position Grade I-II cervical mobilizations 10 seconds x 3 trials each level  Cervical side bend with overpressure to [place] x 30 seconds x 4 trials cervical rotation with overpressure to [place] x 30 seconds x4 trials  Supine ER with elbows to back YTB 30 seconds  Education on head positioning during exercises during the gym.    Pt educated throughout session about proper posture and technique with exercises. Improved exercise technique, movement at target joints, use of target muscles after min to mod verbal, visual, tactile cues                   PT Education - 08/19/19 1245    Education Details  exercise technique, body mechanics, compliance to PT POC    Person(s) Educated  Patient    Methods  Explanation;Demonstration;Tactile cues;Verbal cues    Comprehension   Verbalized understanding;Returned demonstration;Verbal cues required;Tactile cues required       PT Short Term Goals - 07/15/19 1752      PT SHORT TERM GOAL #1   Title  Patient will be independent in home exercise program to improve strength/mobility for better functional independence with ADLs.    Baseline  7/6: HEP given 9/2: HEp compliant    Time  2    Period  Weeks    Status  Partially Met    Target Date  07/29/19        PT Long Term Goals - 07/15/19 1752      PT LONG TERM GOAL #1   Title  Patient will  increase Berg Balance score by > 6 points (53/56) to demonstrate decreased fall risk during functional activities.    Baseline  7/6: 40/56 9/2: 47% (met goal, revising to be more challenging)    Time  8    Period  Weeks    Status  Revised    Target Date  09/09/19      PT LONG TERM GOAL #2   Title  Patient will increase ABC scale score >80% to demonstrate better functional mobility and better confidence with ADLs.    Baseline  7/6: 40/56    Time  8    Period  Weeks    Status  On-going    Target Date  09/09/19      PT LONG TERM GOAL #3   Title  Patient will perform a squat and/or a lunge without using UE assistance or losing balance to return to previous exercise program.    Baseline  7/6: loses balance upon attempt 9/2: able to perform squats, lunges are more challenging to but does not lose balance now    Time  8    Period  Weeks    Status  Achieved      PT LONG TERM GOAL #4   Title  Patient will be independent in walking on even/uneven surface with least restrictive assistive device, for 20+ minutes without rest break, reporting some difficulty or less to improve walking tolerance with community ambulation including grocery shopping, going to church,etc.    Baseline  7/6: unable to walk for long durations without severe pain 9/2: increase in pain by +3/10 however is able to perform    Time  8    Period  Weeks    Status  Partially Met    Target Date  09/09/19       PT LONG TERM GOAL #5   Title  Patient will report a worst pain of 5/10 on VAS in cervical spine to improve tolerance with ADLs and reduced symptoms with activities.    Baseline  9/2: 10/10    Time  8    Period  Weeks    Status  New    Target Date  09/09/19      Additional Long Term Goals   Additional Long Term Goals  Yes      PT LONG TERM GOAL #6   Title  Patient will decrease NDI to <30% for decreased percieved disability from severe to mild for improved quality of life.    Baseline  9/2: 54%    Time  8    Period  Weeks    Status  New    Target Date  09/09/19            Plan - 08/19/19 1253    Clinical Impression Statement  Patient agreeable to trial period for rest of October for compliance to therapy plan of care due to prolonged absence from COVID exposures and other extenuating events. reduction/symptom relief. Patient will benefit from skilled physical therapy to decrease pain levels, improve coordination and posture for improved quality of life.    Personal Factors and Comorbidities  Comorbidity 3+;Fitness;Past/Current Experience;Profession;Social Background;Time since onset of injury/illness/exacerbation    Comorbidities  cervical cancer, bilateral carpal tunnel syndrome, polyneuropathy, depression, migraines, VSD, ADD, anxiety, GERD    Examination-Activity Limitations  Bend;Caring for Others;Carry;Dressing;Lift;Locomotion Level;Reach Overhead;Squat;Stairs;Stand;Toileting;Transfers    Examination-Participation Restrictions  Church;Cleaning;Community Activity;Driving;Interpersonal Relationship;Laundry;Shop;Volunteer;Yard Work    Copy  PT Frequency  2x / week    PT Duration  8 weeks    PT Treatment/Interventions  ADLs/Self Care Home Management;Cryotherapy;Electrical Stimulation;Iontophoresis 2m/ml Dexamethasone;Moist Heat;Traction;Ultrasound;DME Instruction;Gait training;Stair  training;Functional mobility training;Therapeutic activities;Therapeutic exercise;Balance training;Neuromuscular re-education;Manual techniques;Passive range of motion;Dry needling;Vestibular;Taping;Splinting;Energy conservation;Patient/family education;Biofeedback;Vasopneumatic Device;Spinal Manipulations;Joint Manipulations    PT Next Visit Plan  cervical distraction    PT Home Exercise Plan  see above    Consulted and Agree with Plan of Care  Patient       Patient will benefit from skilled therapeutic intervention in order to improve the following deficits and impairments:  Abnormal gait, Decreased activity tolerance, Decreased balance, Decreased endurance, Decreased coordination, Decreased mobility, Difficulty walking, Decreased strength, Impaired flexibility, Impaired perceived functional ability, Impaired sensation, Postural dysfunction, Improper body mechanics, Pain  Visit Diagnosis: Cervicalgia  Radiculopathy, cervical region  Other abnormalities of gait and mobility  Unsteadiness on feet     Problem List Patient Active Problem List   Diagnosis Date Noted  . GAD (generalized anxiety disorder) 06/22/2019  . Insomnia due to medical condition 06/22/2019  . Needle phobia 06/22/2019  . Bilateral carpal tunnel syndrome 08/27/2018  . Intractable migraine with aura without status migrainosus 08/27/2018  . Polyneuropathy 08/27/2018  . [place] 08/18/[place]  . Chest pain 09/23/2017  . Elevated TSH 07/03/2017  . Menorrhagia with irregular cycle 09/17/2016  . Chronic idiopathic urticaria 09/03/2016  . Postoperative state 08/20/2016  . Calcaneal spur 07/10/2016  . On prednisone therapy 06/20/2016  . Allergic rhinitis 04/17/2016  . Allergic urticaria due to ingested food 02/09/2016  . Heavy drinker 04/11/2014  . Depression 09/30/2013  . Irregular bleeding 09/30/2013  . Migraine headache 09/30/2013  . Excess weight 09/30/2013  . Frequent UTI 09/30/2013  . Infection  of urinary tract 09/30/2013  . VSD (ventricular septal defect and aortic arch hypoplasia 08/05/2013  . Abnormal weight gain 08/05/2013   MJanna Arch PT, DPT   08/19/2019, 12:55 PM  [place]MAIN [facility] IncSERVICES [address] [address]Phone: [phone]  Fax:  [phone] Name: Kristine COALEMRN: [id number] of Birth: [birth year]

## 2019-08-20 ENCOUNTER — Other Ambulatory Visit: Payer: Self-pay

## 2019-08-20 ENCOUNTER — Ambulatory Visit: Payer: 59

## 2019-08-20 DIAGNOSIS — M542 Cervicalgia: Secondary | ICD-10-CM | POA: Diagnosis not present

## 2019-08-20 DIAGNOSIS — M5412 Radiculopathy, cervical region: Secondary | ICD-10-CM

## 2019-08-20 DIAGNOSIS — R2681 Unsteadiness on feet: Secondary | ICD-10-CM

## 2019-08-20 DIAGNOSIS — R2689 Other abnormalities of gait and mobility: Secondary | ICD-10-CM

## 2019-08-21 NOTE — Therapy (Signed)
[place] MAIN [facility] SERVICES [address] [place], [place], [address] Phone: [phone]   Fax:  [phone]  Physical Therapy Treatment  Patient Details  Name: WILMA MICHAELSON MRN: [id number] Date of Birth: 1971-01-29 Referring Provider (PT): hemang shah    Encounter Date: 08/20/2019  PT End of Session - 08/21/19 1359    Visit Number  14    Number of Visits  25    Date for PT Re-Evaluation  09/09/19    Authorization Type  4/10 PN on 07/22/19    PT Start Time  1649    PT Stop Time  1730    PT Time Calculation (min)  41 min    Equipment Utilized During Treatment  --   during stair training   Activity Tolerance  Patient tolerated treatment well;Patient limited by pain    Behavior During Therapy  WFL for tasks assessed/performed       Past Medical History:  Diagnosis Date  . Abnormal weight gain 08/05/2013  . Absence of interventricular septum 08/05/2013  . Allergic rhinitis 04/17/2016  . Allergic urticaria due to ingested food 02/09/2016  . Anxiety   . Asthma   . Clinical depression 09/30/2013  . Complication of anesthesia    PT HAD TROUBLE BREATHING AFTER HYSTERECTOMY IN OCTOBER  . Depression   . Dizziness   . Excess weight 09/30/2013  . GERD (gastroesophageal reflux disease)   . Headache, migraine 09/30/2013  . Heart murmur   . Heavy drinker 04/11/2014   Overview:  Patient reports drinking 2-3 bottles of wine each weekend.    . History of methicillin resistant staphylococcus aureus (MRSA) 11/2015  . Infection of urinary tract 09/30/2013  . Irregular bleeding 09/30/2013  . Multiple allergies   . VSD (ventricular septal defect and aortic arch hypoplasia     Past Surgical History:  Procedure Laterality Date  . BOTOX INJECTION N/A 10/23/2016   Procedure: BOTOX INJECTION;  Surgeon: Royston Cowper, MD;  Location: [facility] ORS;  Service: Urology;  Laterality: N/A;  . BREAST BIOPSY Left 11/13/13   benign  . CESAREAN SECTION    .  COLONOSCOPY    . CORONARY ANGIOPLASTY    . CYSTOSCOPY N/A 10/23/2016   Procedure: CYSTOSCOPY;  Surgeon: Royston Cowper, MD;  Location: [facility] ORS;  Service: Urology;  Laterality: N/A;  . CYSTOSCOPY WITH HYDRODISTENSION AND BIOPSY    . LAPAROSCOPIC ASSISTED VAGINAL HYSTERECTOMY N/A 08/20/2016   Procedure: LAPAROSCOPIC ASSISTED VAGINAL HYSTERECTOMY;  Surgeon: Boykin Nearing, MD;  Location: [facility] ORS;  Service: Gynecology;  Laterality: N/A;  . LAPAROSCOPIC BILATERAL SALPINGECTOMY Bilateral 08/20/2016   Procedure: LAPAROSCOPIC BILATERAL SALPINGECTOMY;  Surgeon: Boykin Nearing, MD;  Location: [facility] ORS;  Service: Gynecology;  Laterality: Bilateral;  . NASAL SINUS SURGERY     x12    There were no vitals filed for this visit.  Subjective Assessment - 08/21/19 1358    Subjective  Patient reports her pain had improved after last session but woke up this morning with a migraine and worsening pain. [place] for the beach after work this weekend due to having a wedding.    Pertinent History  Patient is a pleasant 48 year old female who presents for balance training/polyneuropathy. PMH includes VSD(ventricular septal defect), allergic rhinitis, UTI, depression, migraines, polyneuropathy, bilateral carpal tunnel syndrome, ADD, anxiety, basal cell carcinoma, fibroids, GERD, and two children by C section.Saw PT in September 2019 at a different facility for plantar fasciitis but reports they were not  able to help.  Patient frequently loses balance but does not fall to the ground as she is able to catch herself. She cannot squat or lunge now without losing balance and wants to be able to return to these activities. 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    Limitations  Sitting;Lifting;Standing;Walking;House hold activities;Other (comment)    How long can you sit comfortably?  limited by ADHD not by pain    How long can you stand comfortably?  stands all day for work: hairdresser    How  long can you walk comfortably?  hurts her feet to walk immediately    Patient Stated Goals  to be able to do a squat or lunge without falling over.    Currently in Pain?  Yes    Pain Score  8     Pain Location  Back    Pain Orientation  Upper;Mid;Lower    Pain Descriptors / Indicators  Tightness;Contraction;Constant    Pain Type  Chronic pain;Neuropathic pain    Pain Radiating Towards  whole body    Pain Onset  More than a month ago    Pain Frequency  Constant              Treatment:  Prone:  Grade II-III mobilizations lumbar spine 10 seconds x2 trials each segment Grade II-III mobilizations thoracic spine and costal joints 10 seconds x 3 trials for postural correction; J mobilization in inferior direction 20 seconds x 3 trials Grade III inferior mobilizations to sacrum and lateral crests 20 seconds x 3 trials each position Grade I-II cervical mobilizations 10 seconds x 3 trials each level STM to upper trap, levator scap, SCM, and neighboring musculature x 8 minutes with multiple muscle tissue tension/knots present STM to lumbar paraspinals, quadratus, and lower thoracic paraspinals with implementation of effleurage, ptrissage, and trigger points for reduction of muscle tissue knots x 5 minutes  Supine:  Cervical side bend with overpressure to [place] x 30 seconds x 4 trials cervical rotation with overpressure to [place] x 30 seconds x4 trials first rib mobilization (L) 20 seconds x 5 Supine ER with elbows to side YTB 30 seconds x2 trials Subscap trigger point with movement for reduction of muscle tissue limitations for improved posture x 3 minutes  Seated: scapular retractions 10x   Pt educated throughout session about proper posture and technique with exercises. Improved exercise technique, movement at target joints, use of target muscles after min to mod verbal, visual, tactile cues                 PT Education - 08/21/19 1359    Education Details  exercise  technique, body mechanics, manual    Person(s) Educated  Patient    Methods  Explanation;Demonstration;Tactile cues;Verbal cues    Comprehension  Verbalized understanding;Returned demonstration;Verbal cues required;Tactile cues required       PT Short Term Goals - 07/15/19 1752      PT SHORT TERM GOAL #1   Title  Patient will be independent in home exercise program to improve strength/mobility for better functional independence with ADLs.    Baseline  7/6: HEP given 9/2: HEp compliant    Time  2    Period  Weeks    Status  Partially Met    Target Date  07/29/19        PT Long Term Goals - 07/15/19 1752      PT LONG TERM GOAL #1   Title  Patient  will increase Berg Balance score by > 6 points (53/56) to demonstrate decreased fall risk during functional activities.    Baseline  7/6: 40/56 9/2: 47% (met goal, revising to be more challenging)    Time  8    Period  Weeks    Status  Revised    Target Date  09/09/19      PT LONG TERM GOAL #2   Title  Patient will increase ABC scale score >80% to demonstrate better functional mobility and better confidence with ADLs.    Baseline  7/6: 40/56    Time  8    Period  Weeks    Status  On-going    Target Date  09/09/19      PT LONG TERM GOAL #3   Title  Patient will perform a squat and/or a lunge without using UE assistance or losing balance to return to previous exercise program.    Baseline  7/6: loses balance upon attempt 9/2: able to perform squats, lunges are more challenging to but does not lose balance now    Time  8    Period  Weeks    Status  Achieved      PT LONG TERM GOAL #4   Title  Patient will be independent in walking on even/uneven surface with least restrictive assistive device, for 20+ minutes without rest break, reporting some difficulty or less to improve walking tolerance with community ambulation including grocery shopping, going to church,etc.    Baseline  7/6: unable to walk for long durations without severe  pain 9/2: increase in pain by +3/10 however is able to perform    Time  8    Period  Weeks    Status  Partially Met    Target Date  09/09/19      PT LONG TERM GOAL #5   Title  Patient will report a worst pain of 5/10 on VAS in cervical spine to improve tolerance with ADLs and reduced symptoms with activities.    Baseline  9/2: 10/10    Time  8    Period  Weeks    Status  New    Target Date  09/09/19      Additional Long Term Goals   Additional Long Term Goals  Yes      PT LONG TERM GOAL #6   Title  Patient will decrease NDI to <30% for decreased percieved disability from severe to mild for improved quality of life.    Baseline  9/2: 54%    Time  8    Period  Weeks    Status  New    Target Date  09/09/19            Plan - 08/21/19 1400    Clinical Impression Statement  Patient presents with limited muscle tissue length and high levels of pain in cervical and lumbar spine. Excessive kyphosis of thoracic spine and hypomobility of cervical spine is aggravated by postural muscle limited length And guarding resulting in an aggressive forward head rounded shoulders position. Session today focused on pain reduction/symptom relief. Patient will benefit from skilled physical therapy to decrease pain levels, improve coordination and posture for improved quality of life.    Personal Factors and Comorbidities  Comorbidity 3+;Fitness;Past/Current Experience;Profession;Social Background;Time since onset of injury/illness/exacerbation    Comorbidities  cervical cancer, bilateral carpal tunnel syndrome, polyneuropathy, depression, migraines, VSD, ADD, anxiety, GERD    Examination-Activity Limitations  Bend;Caring for Others;Carry;Dressing;Lift;Locomotion Level;Reach Overhead;Squat;Stairs;Stand;Toileting;Transfers    Examination-Participation  Restrictions  Church;Cleaning;Community Activity;Driving;Interpersonal Relationship;Laundry;Shop;Volunteer;Yard Work    [employer]chant [employer] officer   Evolving/Moderate complexity    Rehab Potential  Fair    PT Frequency  2x / week    PT Duration  8 weeks    PT Treatment/Interventions  ADLs/Self Care Home Management;Cryotherapy;Electrical Stimulation;Iontophoresis 80m/ml Dexamethasone;Moist Heat;Traction;Ultrasound;DME Instruction;Gait training;Stair training;Functional mobility training;Therapeutic activities;Therapeutic exercise;Balance training;Neuromuscular re-education;Manual techniques;Passive range of motion;Dry needling;Vestibular;Taping;Splinting;Energy conservation;Patient/family education;Biofeedback;Vasopneumatic Device;Spinal Manipulations;Joint Manipulations    PT Next Visit Plan  cervical distraction    PT Home Exercise Plan  see above    Consulted and Agree with Plan of Care  Patient       Patient will benefit from skilled therapeutic intervention in order to improve the following deficits and impairments:  Abnormal gait, Decreased activity tolerance, Decreased balance, Decreased endurance, Decreased coordination, Decreased mobility, Difficulty walking, Decreased strength, Impaired flexibility, Impaired perceived functional ability, Impaired sensation, Postural dysfunction, Improper body mechanics, Pain  Visit Diagnosis: Cervicalgia  Radiculopathy, cervical region  Other abnormalities of gait and mobility  Unsteadiness on feet     Problem List Patient Active Problem List   Diagnosis Date Noted  . GAD (generalized anxiety disorder) 06/22/2019  . Insomnia due to medical condition 06/22/2019  . Needle phobia 06/22/2019  . Bilateral carpal tunnel syndrome 08/27/2018  . Intractable migraine with aura without status migrainosus 08/27/2018  . Polyneuropathy 08/27/2018  . C[place]ical cancer [place] 08/18/[place]  . Chest pain 09/23/2017  . Elevated TSH 07/03/2017  . Menorrhagia with irregular cycle 09/17/2016  . Chronic idiopathic urticaria 09/03/2016  . Postoperative state 08/20/2016  . Calcaneal spur 07/10/2016  . On  prednisone therapy 06/20/2016  . Allergic rhinitis 04/17/2016  . Allergic urticaria due to ingested food 02/09/2016  . Heavy drinker 04/11/2014  . Depression 09/30/2013  . Irregular bleeding 09/30/2013  . Migraine headache 09/30/2013  . Excess weight 09/30/2013  . Frequent UTI 09/30/2013  . Infection of urinary tract 09/30/2013  . VSD (ventricular septal defect and aortic arch hypoplasia 08/05/2013  . Abnormal weight gain 08/05/2013   MJanna Arch PT, DPT   08/21/2019, 2:01 PM  CB[place]MAIN RSaint [facility] CenterSERVICES [address]RMount [place] [address]Phone: [phone]  Fax:  [phone] Name: SABERDEEN HAFENMRN: [id number] of Birth: 6May 01, 1972

## 2019-08-25 ENCOUNTER — Ambulatory Visit: Payer: 59

## 2019-08-27 ENCOUNTER — Ambulatory Visit: Payer: 59

## 2019-08-27 DIAGNOSIS — M542 Cervicalgia: Secondary | ICD-10-CM

## 2019-08-27 DIAGNOSIS — R2681 Unsteadiness on feet: Secondary | ICD-10-CM

## 2019-08-27 DIAGNOSIS — R2689 Other abnormalities of gait and mobility: Secondary | ICD-10-CM

## 2019-08-27 DIAGNOSIS — M5412 Radiculopathy, cervical region: Secondary | ICD-10-CM

## 2019-08-27 NOTE — Therapy (Signed)
[facility] SERVICES [address] [place], [place], [address] Phone: [phone]   Fax:  [phone]  Physical Therapy Treatment  Patient Details  Name: Kristine [name] MRN: [id number] Date of Birth: 02-12-1971 Referring Provider (PT): hemang shah    Encounter Date: 08/27/2019  PT End of Session - 08/27/19 1706    Visit Number  15    Number of Visits  25    Date for PT Re-Evaluation  09/09/19    Authorization Type  5/10 PN on 07/22/19    PT Start Time  1612    PT Stop Time  1656    PT Time Calculation (min)  44 min    Equipment Utilized During Treatment  --   during stair training   Activity Tolerance  Patient tolerated treatment well    Behavior During Therapy  [facility] for tasks assessed/performed       Past Medical History:  Diagnosis Date  . Abnormal weight gain 08/05/2013  . Absence of interventricular septum 08/05/2013  . Allergic rhinitis 04/17/2016  . Allergic urticaria due to ingested food 02/09/2016  . Anxiety   . Asthma   . Clinical depression 09/30/2013  . Complication of anesthesia    PT HAD TROUBLE BREATHING AFTER HYSTERECTOMY IN OCTOBER  . Depression   . Dizziness   . Excess weight 09/30/2013  . GERD (gastroesophageal reflux disease)   . Headache, migraine 09/30/2013  . Heart murmur   . Heavy drinker 04/11/2014   Overview:  Patient reports drinking 2-3 bottles of wine each weekend.    . History of methicillin resistant staphylococcus aureus (MRSA) 11/2015  . Infection of urinary tract 09/30/2013  . Irregular bleeding 09/30/2013  . Multiple allergies   . VSD (ventricular septal defect and aortic arch hypoplasia     Past Surgical History:  Procedure Laterality Date  . BOTOX INJECTION N/A 10/23/2016   Procedure: BOTOX INJECTION;  Surgeon: Royston Cowper, MD;  Location: [facility];  Service: Urology;  Laterality: N/A;  . BREAST BIOPSY Left 11/13/13   benign  . CESAREAN SECTION    . COLONOSCOPY    . CORONARY  ANGIOPLASTY    . CYSTOSCOPY N/A 10/23/2016   Procedure: CYSTOSCOPY;  Surgeon: Royston Cowper, MD;  Location: [facility];  Service: Urology;  Laterality: N/A;  . CYSTOSCOPY WITH HYDRODISTENSION AND BIOPSY    . LAPAROSCOPIC ASSISTED VAGINAL HYSTERECTOMY N/A 08/20/2016   Procedure: LAPAROSCOPIC ASSISTED VAGINAL HYSTERECTOMY;  Surgeon: Boykin Nearing, MD;  Location: [facility];  Service: Gynecology;  Laterality: N/A;  . LAPAROSCOPIC BILATERAL SALPINGECTOMY Bilateral 08/20/2016   Procedure: LAPAROSCOPIC BILATERAL SALPINGECTOMY;  Surgeon: Boykin Nearing, MD;  Location: [facility];  Service: Gynecology;  Laterality: Bilateral;  . NASAL SINUS SURGERY     x12    There were no vitals filed for this visit.  Subjective Assessment - 08/27/19 1705    Subjective  Patient reports her sinus pain continues to be prevelent, is worried she may have to get surgery. Has started increased self care this week and has had noticeable less stress and pain with this.    Pertinent History  Patient is a pleasant 48 year old female who presents for balance training/polyneuropathy. PMH includes VSD(ventricular septal defect), allergic rhinitis, UTI, depression, migraines, polyneuropathy, bilateral carpal tunnel syndrome, ADD, anxiety, basal cell carcinoma, fibroids, GERD, and two children by C section.Saw PT in September 2019 at a different facility for plantar fasciitis but reports they were not able to help.  Patient frequently loses balance but does not fall to the ground as she is able to catch herself. She cannot squat or lunge now without losing balance and wants to be able to return to these activities. 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    Limitations  Sitting;Lifting;Standing;Walking;House hold activities;Other (comment)    How long can you sit comfortably?  limited by ADHD not by pain    How long can you stand comfortably?  stands all day for work: hairdresser    How long can you walk  comfortably?  hurts her feet to walk immediately    Patient Stated Goals  to be able to do a squat or lunge without falling over.    Currently in Pain?  Yes    Pain Score  6     Pain Location  Back    Pain Orientation  Upper;Mid;Lower    Pain Descriptors / Indicators  Tightness;Constant;Contraction    Pain Type  Chronic pain;Neuropathic pain    Pain Onset  More than a month ago    Pain Frequency  Constant         Treatment:  Prone:  Grade II-III mobilizations lumbar spine 10 seconds x2 trials each segment Grade II-III mobilizations thoracic spine and costal joints 10 seconds x 3 trials for postural correction; J mobilization in inferior direction 20 seconds x 3 trials  Grade III inferior mobilizations to sacrum and lateral crests 20 seconds x 3 trials each position Grade I-II cervical mobilizations 10 seconds x 3 trials each level  Seated: STM to cervical paraspinals, upper trap, levator scap, and SCM 9 minutes; implement use of pettrisage, effleurage, and trigger point release for reduction of muscle tissue tension,knots, and pain  Upper Limb Tension Medial Nerve Glide: performed each side/UE -arm only glide with head tilt stable 5x ; 2 sets each side -arm stable head only tilt 5x, 2 sets each side -arm and head movement 5x: too challenging  Upper Limb Tension Radial Nerve Glide: performed each UE  - arm only glide with head tilt stable 5x ; 2 sets each side -arm stable head only tilt 5x, 2 sets each side -arm and head movement 5x: too challenging  RTB row 10x ; verbal cueing for body mechanics RTB straight arm row 10x, verbal cueing for shoulder position No weight ER with neutral hand alignment for postural correction 10x    Pt educated throughout session about proper posture and technique with exercises. Improved exercise technique, movement at target joints, use of target muscles after min to mod verbal, visual, tactile cues                  PT Education  - 08/27/19 1706    Education Details  exercise technique, body mechanics, posture, manual    Person(s) Educated  Patient    Methods  Explanation;Demonstration;Tactile cues;Verbal cues    Comprehension  Verbalized understanding;Returned demonstration;Verbal cues required;Tactile cues required       PT Short Term Goals - 07/15/19 1752      PT SHORT TERM GOAL #1   Title  Patient will be independent in home exercise program to improve strength/mobility for better functional independence with ADLs.    Baseline  7/6: HEP given 9/2: HEp compliant    Time  2    Period  Weeks    Status  Partially Met    Target Date  07/29/19        PT Long Term Goals -  07/15/19 1752      PT LONG TERM GOAL #1   Title  Patient will increase Berg Balance score by > 6 points (53/56) to demonstrate decreased fall risk during functional activities.    Baseline  7/6: 40/56 9/2: 47% (met goal, revising to be more challenging)    Time  8    Period  Weeks    Status  Revised    Target Date  09/09/19      PT LONG TERM GOAL #2   Title  Patient will increase ABC scale score >80% to demonstrate better functional mobility and better confidence with ADLs.    Baseline  7/6: 40/56    Time  8    Period  Weeks    Status  On-going    Target Date  09/09/19      PT LONG TERM GOAL #3   Title  Patient will perform a squat and/or a lunge without using UE assistance or losing balance to return to previous exercise program.    Baseline  7/6: loses balance upon attempt 9/2: able to perform squats, lunges are more challenging to but does not lose balance now    Time  8    Period  Weeks    Status  Achieved      PT LONG TERM GOAL #4   Title  Patient will be independent in walking on even/uneven surface with least restrictive assistive device, for 20+ minutes without rest break, reporting some difficulty or less to improve walking tolerance with community ambulation including grocery shopping, going to church,etc.    Baseline   7/6: unable to walk for long durations without severe pain 9/2: increase in pain by +3/10 however is able to perform    Time  8    Period  Weeks    Status  Partially Met    Target Date  09/09/19      PT LONG TERM GOAL #5   Title  Patient will report a worst pain of 5/10 on VAS in cervical spine to improve tolerance with ADLs and reduced symptoms with activities.    Baseline  9/2: 10/10    Time  8    Period  Weeks    Status  New    Target Date  09/09/19      Additional Long Term Goals   Additional Long Term Goals  Yes      PT LONG TERM GOAL #6   Title  Patient will decrease NDI to <30% for decreased percieved disability from severe to mild for improved quality of life.    Baseline  9/2: 54%    Time  8    Period  Weeks    Status  New    Target Date  09/09/19            Plan - 08/27/19 1713    Clinical Impression Statement  Patient presents with improved mindset, tension levels, and muscle tissue length allowing for progression to upper extremity nerve glides to be performed for reduction of radiating cervical symptoms. Patient educated on variety of techniques for performance at home and demonstrated understanding. Patient reports decreased pain by end of session with improved posture at rest. Patient will benefit from skilled physical therapy to decrease pain levels, improve coordination and posture for improved quality of life.    Personal Factors and Comorbidities  Comorbidity 3+;Fitness;Past/Current Experience;Profession;Social Background;Time since onset of injury/illness/exacerbation    Comorbidities  cervical cancer, bilateral carpal tunnel syndrome, polyneuropathy, depression, migraines,  VSD, ADD, anxiety, GERD    Examination-Activity Limitations  Bend;Caring for Others;Carry;Dressing;Lift;Locomotion Level;Reach Overhead;Squat;Stairs;Stand;Toileting;Transfers    Examination-Participation Restrictions  Church;Cleaning;Community Activity;Driving;Interpersonal  Relationship;Laundry;Shop;Volunteer;Yard Work    [employer] officer  Evolving/Moderate complexity    Rehab Potential  Fair    PT Frequency  2x / week    PT Duration  8 weeks    PT Treatment/Interventions  ADLs/Self Care Home Management;Cryotherapy;Electrical Stimulation;Iontophoresis 28m/ml Dexamethasone;Moist Heat;Traction;Ultrasound;DME Instruction;Gait training;Stair training;Functional mobility training;Therapeutic activities;Therapeutic exercise;Balance training;Neuromuscular re-education;Manual techniques;Passive range of motion;Dry needling;Vestibular;Taping;Splinting;Energy conservation;Patient/family education;Biofeedback;Vasopneumatic Device;Spinal Manipulations;Joint Manipulations    PT Next Visit Plan  cervical distraction    PT Home Exercise Plan  see above    Consulted and Agree with Plan of Care  Patient       Patient will benefit from skilled therapeutic intervention in order to improve the following deficits and impairments:  Abnormal gait, Decreased activity tolerance, Decreased balance, Decreased endurance, Decreased coordination, Decreased mobility, Difficulty walking, Decreased strength, Impaired flexibility, Impaired perceived functional ability, Impaired sensation, Postural dysfunction, Improper body mechanics, Pain  Visit Diagnosis: Cervicalgia  Radiculopathy, cervical region  Other abnormalities of gait and mobility  Unsteadiness on feet     Problem List Patient Active Problem List   Diagnosis Date Noted  . GAD (generalized anxiety disorder) 06/22/2019  . Insomnia due to medical condition 06/22/2019  . Needle phobia 06/22/2019  . Bilateral carpal tunnel syndrome 08/27/2018  . Intractable migraine with aura without status migrainosus 08/27/2018  . Polyneuropathy 08/27/2018  . [place] 08/18/[place]  . Chest pain 09/23/2017  . Elevated TSH 07/03/2017  . Menorrhagia with irregular cycle 09/17/2016  . Chronic idiopathic urticaria  09/03/2016  . Postoperative state 08/20/2016  . Calcaneal spur 07/10/2016  . On prednisone therapy 06/20/2016  . Allergic rhinitis 04/17/2016  . Allergic urticaria due to ingested food 02/09/2016  . Heavy drinker 04/11/2014  . Depression 09/30/2013  . Irregular bleeding 09/30/2013  . Migraine headache 09/30/2013  . Excess weight 09/30/2013  . Frequent UTI 09/30/2013  . Infection of urinary tract 09/30/2013  . VSD (ventricular septal defect and aortic arch hypoplasia 08/05/2013  . Abnormal weight gain 08/05/2013   MJanna Arch PT, DPT   08/27/2019, 5:15 PM  [facility] [address] [address]Phone: [phone]  Fax:  [phone] Name: Kristine KOTOWSKIMRN: [id number] of Birth: 605-03-1971

## 2019-08-31 ENCOUNTER — Ambulatory Visit (INDEPENDENT_AMBULATORY_CARE_PROVIDER_SITE_OTHER): Payer: 59 | Admitting: Psychiatry

## 2019-08-31 ENCOUNTER — Encounter: Payer: Self-pay | Admitting: Psychiatry

## 2019-08-31 ENCOUNTER — Other Ambulatory Visit: Payer: Self-pay

## 2019-08-31 DIAGNOSIS — F40298 Other specified phobia: Secondary | ICD-10-CM | POA: Diagnosis not present

## 2019-08-31 DIAGNOSIS — F411 Generalized anxiety disorder: Secondary | ICD-10-CM

## 2019-08-31 DIAGNOSIS — G4701 Insomnia due to medical condition: Secondary | ICD-10-CM

## 2019-08-31 MED ORDER — OXCARBAZEPINE 150 MG PO TABS: 150.0000 mg | ORAL_TABLET | Freq: Two times a day (BID) | ORAL | 1 refills | Status: DC

## 2019-08-31 NOTE — Progress Notes (Signed)
Virtual Visit via Telephone Note  I connected with KARMAN CASIDA on 08/31/19 at 10:00 AM EDT by telephone and verified that I am speaking with the correct person using two identifiers.   I discussed the limitations, risks, security and privacy concerns of performing an evaluation and management service by telephone and the availability of in person appointments. I also discussed with the patient that there may be a patient responsible charge related to this service. The patient expressed understanding and agreed to proceed.   I discussed the assessment and treatment plan with the patient. The patient was provided an opportunity to ask questions and all were answered. The patient agreed with the plan and demonstrated an understanding of the instructions.   The patient was advised to call back or seek an in-person evaluation if the symptoms worsen or if the condition fails to improve as anticipated.   [facility] MD OP Progress Note  08/31/2019 12:39 PM ALYXANDRA FOLINO  MRN:  [id number]  Chief Complaint:  Chief Complaint    Follow-up     HPI: Nicia is a 48 year old female, married, employed, lives in [place], has a history of GAD, insomnia, needle phobia, VSD, TSH abnormalities, GERD was evaluated by phone today.  Patient preferred to do a phone call.  Patient today reports she has been struggling with migraine headaches recently.  She is waiting for an appointment with her provider for an injection.  Patient reports this has been affecting her mood.  She has noticed that she has been more anxious.  She also has episodes when she feels sad on and off.  She denies any crying spells.  She reports sleep is restless occasionally however she has been able to manage that overall.  She does have temazepam as needed available.  She denies any suicidality.  Patient denies any homicidality.  Patient denies any perceptual disturbances.  Patient reports she has stopped BuSpar since she felt it was  not helpful.  Patient has a history of multiple medication trials, being very sensitive to medications overall.  Discussed Trileptal.  Patient agrees with plan.  However per system it may have an interaction since she has soy allergy.  She reports she will talk to the pharmacist before taking it.  Patient has stopped psychotherapy sessions at this time.  Discussed with her about restarting therapy sessions or finding a new therapist.  She will work on the same. Visit Diagnosis:    ICD-10-CM   1. GAD (generalized anxiety disorder)  F41.1 OXcarbazepine (TRILEPTAL) 150 MG tablet  2. Insomnia due to medical condition  G47.01   3. Needle phobia  F40.298     Past Psychiatric History: I have reviewed past psychiatric history from my progress note on 12/12/2018  Past Medical History:  Past Medical History:  Diagnosis Date  . Abnormal weight gain 08/05/2013  . Absence of interventricular septum 08/05/2013  . Allergic rhinitis 04/17/2016  . Allergic urticaria due to ingested food 02/09/2016  . Anxiety   . Asthma   . Clinical depression 09/30/2013  . Complication of anesthesia    PT HAD TROUBLE BREATHING AFTER HYSTERECTOMY IN OCTOBER  . Depression   . Dizziness   . Excess weight 09/30/2013  . GERD (gastroesophageal reflux disease)   . Headache, migraine 09/30/2013  . Heart murmur   . Heavy drinker 04/11/2014   Overview:  Patient reports drinking 2-3 bottles of wine each weekend.    . History of methicillin resistant staphylococcus aureus (MRSA) 11/2015  . Infection of  urinary tract 09/30/2013  . Irregular bleeding 09/30/2013  . Multiple allergies   . VSD (ventricular septal defect and aortic arch hypoplasia     Past Surgical History:  Procedure Laterality Date  . BOTOX INJECTION N/A 10/23/2016   Procedure: BOTOX INJECTION;  Surgeon: Royston Cowper, MD;  Location: [facility];  Service: Urology;  Laterality: N/A;  . BREAST BIOPSY Left 11/13/13   benign  . CESAREAN SECTION    . COLONOSCOPY     . CORONARY ANGIOPLASTY    . CYSTOSCOPY N/A 10/23/2016   Procedure: CYSTOSCOPY;  Surgeon: Royston Cowper, MD;  Location: [facility];  Service: Urology;  Laterality: N/A;  . CYSTOSCOPY WITH HYDRODISTENSION AND BIOPSY    . LAPAROSCOPIC ASSISTED VAGINAL HYSTERECTOMY N/A 08/20/2016   Procedure: LAPAROSCOPIC ASSISTED VAGINAL HYSTERECTOMY;  Surgeon: Boykin Nearing, MD;  Location: [facility];  Service: Gynecology;  Laterality: N/A;  . LAPAROSCOPIC BILATERAL SALPINGECTOMY Bilateral 08/20/2016   Procedure: LAPAROSCOPIC BILATERAL SALPINGECTOMY;  Surgeon: Boykin Nearing, MD;  Location: [facility];  Service: Gynecology;  Laterality: Bilateral;  . NASAL SINUS SURGERY     x12    Family Psychiatric History: I have reviewed family psychiatric history from my progress note on 12/12/2018  Family History:  Family History  Problem Relation Age of Onset  . Breast cancer Maternal Aunt   . Breast cancer Paternal Aunt   . Breast cancer Maternal Grandmother   . Breast cancer Paternal Grandmother   . Breast cancer Paternal Aunt   . Bipolar disorder Paternal Aunt   . Kidney cancer Maternal Uncle   . Prostate cancer Paternal Grandfather   . Kidney disease Other        father's side  . Aneurysm Mother   . Liver disease Mother   . Drug abuse Mother   . Alcohol abuse Mother   . Anxiety disorder Mother   . Depression Mother   . Cancer Father   . Anxiety disorder Father   . Depression Father     Social History: I have reviewed social history from my progress note on 12/12/2018 Social History   Socioeconomic History  . Marital status: Married    Spouse name: greg   . Number of children: 2  . Years of education: Not on file  . Highest education level: Associate degree: occupational, Hotel manager, or vocational program  Occupational History  . Not on file  Social Needs  . Financial resource strain: Not hard at all  . Food insecurity    Worry: Never true    Inability: Never true  . Transportation  needs    Medical: No    Non-medical: No  Tobacco Use  . Smoking status: Former Smoker    Types: Cigarettes    Quit date: 11/12/2006    Years since quitting: 12.8  . Smokeless tobacco: Never Used  . Tobacco comment: quit 2008  Substance and Sexual Activity  . Alcohol use: No    Alcohol/week: 0.0 standard drinks  . Drug use: No  . Sexual activity: Yes    Birth control/protection: None  Lifestyle  . Physical activity    Days per week: 0 days    Minutes per session: 0 min  . Stress: Very much  Relationships  . Social Herbalist on phone: Not on file    Gets together: Not on file    Attends religious service: More than 4 times per year    Active member of club or organization: No  Attends meetings of clubs or organizations: Never    Relationship status: Married  Other Topics Concern  . Not on file  Social History Narrative  . Not on file    Allergies:  Allergies  Allergen Reactions  . Bee Pollen Anaphylaxis  . Bee Venom Anaphylaxis  . Eggs Or Egg-Derived Products Anaphylaxis  . Keflex [Cephalexin] Anaphylaxis  . Lac Bovis Anaphylaxis  . Lactase Anaphylaxis and Other (See Comments)  . Milk-Related Compounds Anaphylaxis  . Penicillins Anaphylaxis    Has patient had a PCN reaction causing immediate rash, facial/tongue/throat swelling, SOB or lightheadedness with hypotension: No Has patient had a PCN reaction causing severe rash involving mucus membranes or skin necrosis: No Has patient had a PCN reaction that required hospitalization No Has patient had a PCN reaction occurring within the last 10 years: Yes If all of the above answers are "NO", then may proceed with Cephalosporin use.   . Shellfish Allergy Anaphylaxis and Other (See Comments)  . Soy Allergy Anaphylaxis  . Wheat Bran Anaphylaxis  . Butalbital-Apap-Caffeine Other (See Comments)    Rebound headache  . Codeine     Other reaction(s): RASH  . Ferrous Gluconate Diarrhea    And vomiting And  vomiting  . Hydrocodone Hives  . Iron Diarrhea  . Red Dye Other (See Comments)  . Iodine Rash  . Latex Itching and Rash    Metabolic Disorder Labs: No results found for: HGBA1C, MPG No results found for: PROLACTIN No results found for: CHOL, TRIG, HDL, CHOLHDL, VLDL, LDLCALC Lab Results  Component Value Date   TSH 5.752 (H) 09/19/2017   TSH 1.190 09/04/2016    Therapeutic Level Labs: No results found for: LITHIUM No results found for: VALPROATE No components found for:  CBMZ  Current Medications: Current Outpatient Medications  Medication Sig Dispense Refill  . albuterol (PROVENTIL HFA;VENTOLIN HFA) 108 (90 Base) MCG/ACT inhaler Inhale 2 puffs into the lungs every 6 (six) hours as needed for wheezing or shortness of breath. 1 Inhaler 2  . ALPRAZolam (XANAX) 0.5 MG tablet Take 1 tablet (0.5 mg total) by mouth as directed. Take it only once a day as needed for severe panic symptoms 10 tablet 0  . azelastine (ASTELIN) 0.1 % nasal spray     . Biotin 10000 MCG TABS Take 10,000 mg by mouth daily.    . cetirizine (ZYRTEC) 10 MG tablet Take 2 tablets by mouth 2 (two) times daily.     . Cholecalciferol (VITAMIN D-1000 MAX ST) 1000 units tablet Take 1 tablet by mouth 2 (two) times daily.     Marland Kitchen CRANBERRY PO Take 1 capsule by mouth at bedtime.     . cyanocobalamin (,VITAMIN B-12,) 1000 MCG/ML injection INJECT ONE ML INTO THE MUSCLE ONCE WEEKLY FOR FOUR WEEKS. THEN INJECT 1ML INTO THE MUSCLE ONCE MONTHY FOR FOUR MONTHS    . desvenlafaxine (PRISTIQ) 100 MG 24 hr tablet Take 1 tablet (100 mg total) by mouth daily. 90 tablet 1  . EPINEPHrine 0.3 mg/0.3 mL IJ SOAJ injection Inject 0.3 mLs (0.3 mg total) into the muscle once. 1 Device 1  . fluticasone (FLONASE) 50 MCG/ACT nasal spray Place 1 spray into both nostrils 2 (two) times daily.     . fluticasone (FLOVENT HFA) 220 MCG/ACT inhaler INHALE 2 PUFFS BY MOUTH TWICE DAILY    . Fluticasone-Salmeterol (ADVAIR) 250-50 MCG/DOSE AEPB Inhale 1 puff  into the lungs 2 (two) times daily.    . Galcanezumab-gnlm 120 MG/ML SOAJ Inject into the skin.    Marland Kitchen  hydrochlorothiazide (HYDRODIURIL) 25 MG tablet Take 25 mg by mouth daily.    . hydrOXYzine (ATARAX/VISTARIL) 50 MG tablet hydroxyzine HCl 50 mg tablet    . Insulin Pen Needle (FIFTY50 PEN NEEDLES) 32G X 4 MM MISC by Does not apply route.    Marland Kitchen levocetirizine (XYZAL) 5 MG tablet Take 2 tablets by mouth twice daily    . levothyroxine (SYNTHROID, LEVOTHROID) 50 MCG tablet Take 50 mcg daily, any generic manufacturer is okay but try to stay with same manufacturer as much as possible    . Liraglutide -Weight Management (SAXENDA) 18 MG/3ML SOPN Inject into the skin.    . magnesium oxide (MAG-OX) 400 MG tablet Take 400 mg by mouth daily.    . montelukast (SINGULAIR) 10 MG tablet Take 10 mg by mouth at bedtime.     . Multiple Vitamins-Minerals (ZINC PO) Take 1 tablet by mouth daily.    . nitrofurantoin, macrocrystal-monohydrate, (MACROBID) 100 MG capsule Take by mouth.    Marland Kitchen ofloxacin (FLOXIN) 0.3 % OTIC solution ofloxacin 0.3 % ear drops    . omalizumab (XOLAIR) 150 MG injection INJECT 300MG  SUBCUTANEOUSLY EVERY 4 WEEKS (GIVEN AT PRESCRIBERS OFFICE)    . RELION PEN NEEDLES 29G X 12MM MISC USE 1 PEN NEEDLE ONCE DAILY AS DIRECTED    . SAXENDA 18 MG/3ML SOPN     . temazepam (RESTORIL) 7.5 MG capsule Take 1 capsule (7.5 mg total) by mouth at bedtime as needed for sleep. 30 capsule 2  . vitamin B-12 (CYANOCOBALAMIN) 1000 MCG tablet Take 1,000 mcg by mouth daily.    . vitamin C (ASCORBIC ACID) 500 MG tablet Take 500 mg by mouth at bedtime.    Marland Kitchen azelastine (OPTIVAR) 0.05 % ophthalmic solution Place 1 drop into both eyes daily.    . Azelastine HCl 0.15 % SOLN Place 2 sprays into the nose 2 (two) times daily.     . candesartan (ATACAND) 8 MG tablet Take by mouth.    . doxycycline (ADOXA) 100 MG tablet TAKE 1 TABLET BY MOUTH TWICE DAILY FOR 21 DAYS    . doxycycline (MONODOX) 100 MG capsule Take 100 mg by mouth 2  (two) times daily.  0  . erythromycin ophthalmic ointment Use 1/4" ribbon to affected eye & sutures if present 2x/day for 14 days. If you develop allergy symptoms, stop & call our office.    . methylPREDNISolone (MEDROL DOSEPAK) 4 MG TBPK tablet     . metoCLOPramide (REGLAN) 5 MG tablet TAKE 1 TABLET BY MOUTH EVERY 6 HOURS AS NEEDED FOR MIGRAINE HEADACHE  1  . OXcarbazepine (TRILEPTAL) 150 MG tablet Take 1 tablet (150 mg total) by mouth 2 (two) times daily. For mood 60 tablet 1  . ranitidine (ZANTAC) 150 MG tablet Take 150 mg by mouth as directed. Takes 1 in the morning 1 before each meal and 1 before bedtime     No current facility-administered medications for this visit.      Musculoskeletal: Strength & Muscle Tone: UTA Gait & Station: Reports as WNL Patient leans: N/A  Psychiatric Specialty Exam: Review of Systems  Neurological: Positive for headaches.  Psychiatric/Behavioral: The patient is nervous/anxious.   All other systems reviewed and are negative.   Last menstrual period 07/17/2016.There is no height or weight on file to calculate BMI.  General Appearance: UTA  Eye Contact:  UTA  Speech:  Clear and Coherent  Volume:  Normal  Mood:  Anxious  Affect:  UTA  Thought Process:  Goal Directed and Descriptions  of Associations: Intact  Orientation:  Full (Time, Place, and Person)  Thought Content: Logical   Suicidal Thoughts:  No  Homicidal Thoughts:  No  Memory:  Immediate;   Fair Recent;   Fair Remote;   Fair  Judgement:  Fair  Insight:  Fair  Psychomotor Activity:  UTA  Concentration:  Concentration: Fair and Attention Span: Fair  Recall:  [employer] of Knowledge: Fair  Language: Fair  Akathisia:  No  Handed:  Right  AIMS (if indicated): UTA  Assets:  Communication Skills Desire for Improvement Housing Intimacy Social Support Talents/Skills  ADL's:  Intact  Cognition: WNL  Sleep:  Fair   Screenings:   Assessment and Plan: Orell is a 48 year old female  has a history of anxiety, multiple medical problems was evaluated by telemedicine today.  Patient continues to struggle with anxiety symptoms more so because of her pain, headaches.  She will benefit from medication readjustment.  Plan GAD-unstable Pristiq 100 mg p.o. daily Discontinue BuSpar for noncompliance Start Trileptal 150 mg p.o. twice daily  Insomnia-stable Temazepam 7.5 mg p.o. nightly as needed  Needle phobia-unstable-chronic Continue CBT as needed  Discussed with patient about the need for psychotherapy sessions-she will try to contact her therapist.  Follow-up in clinic in 1 month or sooner if needed.  November 16 at 245.  I have spent atleast 15 minutes non face to face with patient today. More than 50 % of the time was spent for psychoeducation and supportive psychotherapy and care coordination. This note was generated in part or whole with voice recognition software. Voice recognition is usually quite accurate but there are transcription errors that can and very often do occur. I apologize for any typographical errors that were not detected and corrected.       Ursula Alert, MD 08/31/2019, 12:39 PM

## 2019-09-01 ENCOUNTER — Other Ambulatory Visit: Payer: Self-pay

## 2019-09-01 ENCOUNTER — Ambulatory Visit: Payer: 59

## 2019-09-01 DIAGNOSIS — M542 Cervicalgia: Secondary | ICD-10-CM | POA: Diagnosis not present

## 2019-09-01 DIAGNOSIS — M5412 Radiculopathy, cervical region: Secondary | ICD-10-CM

## 2019-09-01 DIAGNOSIS — R2689 Other abnormalities of gait and mobility: Secondary | ICD-10-CM

## 2019-09-01 DIAGNOSIS — R2681 Unsteadiness on feet: Secondary | ICD-10-CM

## 2019-09-01 NOTE — Therapy (Signed)
[place] MAIN [facility] SERVICES [address] [place], [place], [address] Phone: [phone]   Fax:  [phone]  Physical Therapy Treatment  Patient Details  Name: Kristine [name] MRN: [id number] Date of Birth: Oct 07, 1971 Referring Provider (PT): hemang shah    Encounter Date: 09/01/2019  PT End of Session - 09/01/19 1738    Visit Number  16    Number of Visits  25    Date for PT Re-Evaluation  09/09/19    Authorization Type  6/10 PN on 07/22/19    PT Start Time  1645    PT Stop Time  1726    PT Time Calculation (min)  41 min    Equipment Utilized During Treatment  --   during stair training   Activity Tolerance  Patient tolerated treatment well    Behavior During Therapy  [facility] for tasks assessed/performed       Past Medical History:  Diagnosis Date  . Abnormal weight gain 08/05/2013  . Absence of interventricular septum 08/05/2013  . Allergic rhinitis 04/17/2016  . Allergic urticaria due to ingested food 02/09/2016  . Anxiety   . Asthma   . Clinical depression 09/30/2013  . Complication of anesthesia    PT HAD TROUBLE BREATHING AFTER HYSTERECTOMY IN OCTOBER  . Depression   . Dizziness   . Excess weight 09/30/2013  . GERD (gastroesophageal reflux disease)   . Headache, migraine 09/30/2013  . Heart murmur   . Heavy drinker 04/11/2014   Overview:  Patient reports drinking 2-3 bottles of wine each weekend.    . History of methicillin resistant staphylococcus aureus (MRSA) 11/2015  . Infection of urinary tract 09/30/2013  . Irregular bleeding 09/30/2013  . Multiple allergies   . VSD (ventricular septal defect and aortic arch hypoplasia     Past Surgical History:  Procedure Laterality Date  . BOTOX INJECTION N/A 10/23/2016   Procedure: BOTOX INJECTION;  Surgeon: Royston Cowper, MD;  Location: [facility] ORS;  Service: Urology;  Laterality: N/A;  . BREAST BIOPSY Left 11/13/13   benign  . CESAREAN SECTION    . COLONOSCOPY    . CORONARY  ANGIOPLASTY    . CYSTOSCOPY N/A 10/23/2016   Procedure: CYSTOSCOPY;  Surgeon: Royston Cowper, MD;  Location: [facility] ORS;  Service: Urology;  Laterality: N/A;  . CYSTOSCOPY WITH HYDRODISTENSION AND BIOPSY    . LAPAROSCOPIC ASSISTED VAGINAL HYSTERECTOMY N/A 08/20/2016   Procedure: LAPAROSCOPIC ASSISTED VAGINAL HYSTERECTOMY;  Surgeon: Boykin Nearing, MD;  Location: [facility] ORS;  Service: Gynecology;  Laterality: N/A;  . LAPAROSCOPIC BILATERAL SALPINGECTOMY Bilateral 08/20/2016   Procedure: LAPAROSCOPIC BILATERAL SALPINGECTOMY;  Surgeon: Boykin Nearing, MD;  Location: [facility] ORS;  Service: Gynecology;  Laterality: Bilateral;  . NASAL SINUS SURGERY     x12    There were no vitals filed for this visit.  Subjective Assessment - 09/01/19 1736    Subjective  Patient has had a CT scan of her nasal sinuses and is seeing a specialist on Thursday. Has woken up with L low back to anterior aspect of leg radiating pain.    Pertinent History  Patient is a pleasant 48 year old female who presents for balance training/polyneuropathy. PMH includes VSD(ventricular septal defect), allergic rhinitis, UTI, depression, migraines, polyneuropathy, bilateral carpal tunnel syndrome, ADD, anxiety, basal cell carcinoma, fibroids, GERD, and two children by C section.Saw PT in September 2019 at a different facility for plantar fasciitis but reports they were not able to help.  Patient frequently  loses balance but does not fall to the ground as she is able to catch herself. She cannot squat or lunge now without losing balance and wants to be able to return to these activities. 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    Limitations  Sitting;Lifting;Standing;Walking;House hold activities;Other (comment)    How long can you sit comfortably?  limited by ADHD not by pain    How long can you stand comfortably?  stands all day for work: hairdresser    How long can you walk comfortably?  hurts her feet to walk  immediately    Patient Stated Goals  to be able to do a squat or lunge without falling over.    Currently in Pain?  Yes    Pain Score  6     Pain Location  Back    Pain Orientation  Upper;Mid;Lower;Left    Pain Descriptors / Indicators  Contraction;Tightness;Shooting    Pain Type  Chronic pain;Neuropathic pain    Pain Radiating Towards  shooting to front of L knee    Pain Onset  More than a month ago    Pain Frequency  Constant    Aggravating Factors   weightbearing    Pain Relieving Factors  rest       Treatment:     Prone:  Grade II-III mobilizations lumbar spine 10 seconds x2 trials each segment Grade II-III mobilizations thoracic spine and costal joints 10 seconds x 3 trials for postural correction; J mobilization in inferior direction 20 seconds x 3 trials Grade III inferior mobilizations to sacrum and lateral crests 20 seconds x 3 trials each position Grade I-II cervical mobilizations 10 seconds x 3 trials each level  roller to L and R lumbar paraspinals, quadratus lumborum, and gluteal musculature including piriformis x 4 minutes  Supine: Hamstring stretch with LLE on PT shoulder 60 second holds IT band straight leg stretch 60 seconds, bent knee stretch 60 seconds Figure four stretch with PT overpressure 60 seconds SAD in figure four position LLE 30 seconds x 3 trials in inferior direction LAD LLE 30 seconds, initially pain relieving but knee pain terminated prolonged hold SAD LLE 40 seconds x 3 trials with pain reduction reported.  Supine : posterior pelvic tilts 10x 3 second holds      Pt educated throughout session about proper posture and technique with exercises. Improved exercise technique, movement at target joints, use of target muscles after min to mod verbal, visual, tactile cues   Patient presents with radiating pain from left low back gluteal region to front of L knee. Pain was reduced with a mixture of manual and therex allowing patient to ambulate with  decreased antalgic gait pattern. Patient will benefit from skilled physical therapy to decrease pain levels, improve coordination and posture for improved quality of life.              PT Education - 09/01/19 1738    Education Details  exercise technique, body mechanics,  posture, pain reduction    Person(s) Educated  Patient    Methods  Explanation;Demonstration;Tactile cues;Verbal cues    Comprehension  Verbalized understanding;Returned demonstration;Verbal cues required;Tactile cues required       PT Short Term Goals - 07/15/19 1752      PT SHORT TERM GOAL #1   Title  Patient will be independent in home exercise program to improve strength/mobility for better functional independence with ADLs.    Baseline  7/6: HEP given 9/2: HEp compliant  Time  2    Period  Weeks    Status  Partially Met    Target Date  07/29/19        PT Long Term Goals - 07/15/19 1752      PT LONG TERM GOAL #1   Title  Patient will increase Berg Balance score by > 6 points (53/56) to demonstrate decreased fall risk during functional activities.    Baseline  7/6: 40/56 9/2: 47% (met goal, revising to be more challenging)    Time  8    Period  Weeks    Status  Revised    Target Date  09/09/19      PT LONG TERM GOAL #2   Title  Patient will increase ABC scale score >80% to demonstrate better functional mobility and better confidence with ADLs.    Baseline  7/6: 40/56    Time  8    Period  Weeks    Status  On-going    Target Date  09/09/19      PT LONG TERM GOAL #3   Title  Patient will perform a squat and/or a lunge without using UE assistance or losing balance to return to previous exercise program.    Baseline  7/6: loses balance upon attempt 9/2: able to perform squats, lunges are more challenging to but does not lose balance now    Time  8    Period  Weeks    Status  Achieved      PT LONG TERM GOAL #4   Title  Patient will be independent in walking on even/uneven surface with  least restrictive assistive device, for 20+ minutes without rest break, reporting some difficulty or less to improve walking tolerance with community ambulation including grocery shopping, going to church,etc.    Baseline  7/6: unable to walk for long durations without severe pain 9/2: increase in pain by +3/10 however is able to perform    Time  8    Period  Weeks    Status  Partially Met    Target Date  09/09/19      PT LONG TERM GOAL #5   Title  Patient will report a worst pain of 5/10 on VAS in cervical spine to improve tolerance with ADLs and reduced symptoms with activities.    Baseline  9/2: 10/10    Time  8    Period  Weeks    Status  New    Target Date  09/09/19      Additional Long Term Goals   Additional Long Term Goals  Yes      PT LONG TERM GOAL #6   Title  Patient will decrease NDI to <30% for decreased percieved disability from severe to mild for improved quality of life.    Baseline  9/2: 54%    Time  8    Period  Weeks    Status  New    Target Date  09/09/19            Plan - 09/01/19 1740    Clinical Impression Statement  Patient presents with radiating pain from left low back gluteal region to front of L knee. Pain was reduced with a mixture of manual and therex allowing patient to ambulate with decreased antalgic gait pattern. Patient will benefit from skilled physical therapy to decrease pain levels, improve coordination and posture for improved quality of life.    Personal Factors and Comorbidities  Comorbidity 3+;Fitness;Past/Current Experience;Profession;Social Background;Time since onset  of injury/illness/exacerbation    Comorbidities  cervical cancer, bilateral carpal tunnel syndrome, polyneuropathy, depression, migraines, VSD, ADD, anxiety, GERD    Examination-Activity Limitations  Bend;Caring for Others;Carry;Dressing;Lift;Locomotion Level;Reach Overhead;Squat;Stairs;Stand;Toileting;Transfers    Examination-Participation Restrictions   Church;Cleaning;Community Activity;Driving;Interpersonal Relationship;Laundry;Shop;Volunteer;Yard Work    [employer] officer  Evolving/Moderate complexity    Rehab Potential  Fair    PT Frequency  2x / week    PT Duration  8 weeks    PT Treatment/Interventions  ADLs/Self Care Home Management;Cryotherapy;Electrical Stimulation;Iontophoresis 29m/ml Dexamethasone;Moist Heat;Traction;Ultrasound;DME Instruction;Gait training;Stair training;Functional mobility training;Therapeutic activities;Therapeutic exercise;Balance training;Neuromuscular re-education;Manual techniques;Passive range of motion;Dry needling;Vestibular;Taping;Splinting;Energy conservation;Patient/family education;Biofeedback;Vasopneumatic Device;Spinal Manipulations;Joint Manipulations    PT Next Visit Plan  cervical distraction    PT Home Exercise Plan  see above    Consulted and Agree with Plan of Care  Patient       Patient will benefit from skilled therapeutic intervention in order to improve the following deficits and impairments:  Abnormal gait, Decreased activity tolerance, Decreased balance, Decreased endurance, Decreased coordination, Decreased mobility, Difficulty walking, Decreased strength, Impaired flexibility, Impaired perceived functional ability, Impaired sensation, Postural dysfunction, Improper body mechanics, Pain  Visit Diagnosis: Cervicalgia  Radiculopathy, cervical region  Other abnormalities of gait and mobility  Unsteadiness on feet     Problem List Patient Active Problem List   Diagnosis Date Noted  . GAD (generalized anxiety disorder) 06/22/2019  . Insomnia due to medical condition 06/22/2019  . Needle phobia 06/22/2019  . Bilateral carpal tunnel syndrome 08/27/2018  . Intractable migraine with aura without status migrainosus 08/27/2018  . Polyneuropathy 08/27/2018  . Cervical cancer ([place] 08/18/2018  . Chest pain 09/23/2017  . Elevated TSH 07/03/2017  . Menorrhagia with  irregular cycle 09/17/2016  . Chronic idiopathic urticaria 09/03/2016  . Postoperative state 08/20/2016  . Calcaneal spur 07/10/2016  . On prednisone therapy 06/20/2016  . Allergic rhinitis 04/17/2016  . Allergic urticaria due to ingested food 02/09/2016  . Heavy drinker 04/11/2014  . Depression 09/30/2013  . Irregular bleeding 09/30/2013  . Migraine headache 09/30/2013  . Excess weight 09/30/2013  . Frequent UTI 09/30/2013  . Infection of urinary tract 09/30/2013  . VSD (ventricular septal defect and aortic arch hypoplasia 08/05/2013  . Abnormal weight gain 08/05/2013   MJanna Arch PT, DPT   09/01/2019, 5:42 PM  [facility] [facility] [address] [place] [address]Phone: [phone]  Fax:  [phone] Name: Kristine TORENMRN: [id number] of Birth: [birth year]

## 2019-09-03 ENCOUNTER — Ambulatory Visit: Payer: 59

## 2019-09-07 ENCOUNTER — Ambulatory Visit: Payer: 59

## 2019-09-09 ENCOUNTER — Telehealth: Payer: Self-pay

## 2019-09-09 NOTE — Telephone Encounter (Signed)
pt called states that she has gained so much weight with the new medication you put her on.  she wants to change to something else.

## 2019-09-09 NOTE — Telephone Encounter (Signed)
Returned call to patient. She is worried about weight gain due to Trileptal. Advised to stop it.

## 2019-09-10 ENCOUNTER — Other Ambulatory Visit: Payer: Self-pay

## 2019-09-10 ENCOUNTER — Emergency Department
Admission: EM | Admit: 2019-09-10 | Discharge: 2019-09-10 | Disposition: A | Discharge status: Home / Self Care | Payer: 59 | Attending: Emergency Medicine | Admitting: Emergency Medicine

## 2019-09-10 ENCOUNTER — Encounter: Payer: Self-pay | Admitting: Emergency Medicine

## 2019-09-10 ENCOUNTER — Ambulatory Visit: Payer: 59

## 2019-09-10 DIAGNOSIS — Z91040 Latex allergy status: Secondary | ICD-10-CM | POA: Insufficient documentation

## 2019-09-10 DIAGNOSIS — J45909 Unspecified asthma, uncomplicated: Secondary | ICD-10-CM | POA: Insufficient documentation

## 2019-09-10 DIAGNOSIS — R519 Headache, unspecified: Secondary | ICD-10-CM | POA: Insufficient documentation

## 2019-09-10 DIAGNOSIS — Z79899 Other long term (current) drug therapy: Secondary | ICD-10-CM | POA: Diagnosis not present

## 2019-09-10 DIAGNOSIS — Z87891 Personal history of nicotine dependence: Secondary | ICD-10-CM | POA: Diagnosis not present

## 2019-09-10 MED ORDER — ONDANSETRON 4 MG PO TBDP
4.0000 mg | ORAL_TABLET | Freq: Once | ORAL | Status: AC
  Administered 2019-09-10: 4 mg via ORAL
  Filled 2019-09-10: qty 1

## 2019-09-10 MED ORDER — LIDOCAINE HCL 1 % EX GEL: 1.0000 [in_us] | Freq: Every day | CUTANEOUS | 0 refills | Status: AC

## 2019-09-10 MED ORDER — TRAMADOL HCL 50 MG PO TABS
50.0000 mg | ORAL_TABLET | Freq: Once | ORAL | Status: AC
  Administered 2019-09-10: 50 mg via ORAL
  Filled 2019-09-10: qty 1

## 2019-09-10 MED ORDER — TRAMADOL HCL 50 MG PO TABS: 50.0000 mg | ORAL_TABLET | Freq: Four times a day (QID) | ORAL | 0 refills | Status: AC | PRN

## 2019-09-10 NOTE — ED Provider Notes (Signed)
[facility] Emergency Department Provider Note  ____________________________________________  Time seen: Approximately 11:03 PM  I have reviewed the triage vital signs and the nursing notes.   HISTORY  Chief Complaint Facial Pain    HPI Kristine [name] is a 48 y.o. female presents to the emergency department with acute on chronic facial pain.  Patient reports that she has a history of nasal polyps and can no longer take anti-inflammatories such as ibuprofen for her nasal polyp pain.  Patient was supposed to have surgery for nasal polyps but it it was temporarily canceled.  Patient states that she has here for pain management.         Past Medical History:  Diagnosis Date  . Abnormal weight gain 08/05/2013  . Absence of interventricular septum 08/05/2013  . Allergic rhinitis 04/17/2016  . Allergic urticaria due to ingested food 02/09/2016  . Anxiety   . Asthma   . Clinical depression 09/30/2013  . Complication of anesthesia    PT HAD TROUBLE BREATHING AFTER HYSTERECTOMY IN OCTOBER  . Depression   . Dizziness   . Excess weight 09/30/2013  . GERD (gastroesophageal reflux disease)   . Headache, migraine 09/30/2013  . Heart murmur   . Heavy drinker 04/11/2014   Overview:  Patient reports drinking 2-3 bottles of wine each weekend.    . History of methicillin resistant staphylococcus aureus (MRSA) 11/2015  . Infection of urinary tract 09/30/2013  . Irregular bleeding 09/30/2013  . Multiple allergies   . VSD (ventricular septal defect and aortic arch hypoplasia     Patient Active Problem List   Diagnosis Date Noted  . GAD (generalized anxiety disorder) 06/22/2019  . Insomnia due to medical condition 06/22/2019  . Needle phobia 06/22/2019  . Bilateral carpal tunnel syndrome 08/27/2018  . Intractable migraine with aura without status migrainosus 08/27/2018  . Polyneuropathy 08/27/2018  . Cervical cancer ([place]) 08/18/2018  . Chest pain 09/23/2017  .  Elevated TSH 07/03/2017  . Menorrhagia with irregular cycle 09/17/2016  . Chronic idiopathic urticaria 09/03/2016  . Postoperative state 08/20/2016  . Calcaneal spur 07/10/2016  . On prednisone therapy 06/20/2016  . Allergic rhinitis 04/17/2016  . Allergic urticaria due to ingested food 02/09/2016  . Heavy drinker 04/11/2014  . Depression 09/30/2013  . Irregular bleeding 09/30/2013  . Migraine headache 09/30/2013  . Excess weight 09/30/2013  . Frequent UTI 09/30/2013  . Infection of urinary tract 09/30/2013  . VSD (ventricular septal defect and aortic arch hypoplasia 08/05/2013  . Abnormal weight gain 08/05/2013    Past Surgical History:  Procedure Laterality Date  . BOTOX INJECTION N/A 10/23/2016   Procedure: BOTOX INJECTION;  Surgeon: Royston Cowper, MD;  Location: [facility] ORS;  Service: Urology;  Laterality: N/A;  . BREAST BIOPSY Left 11/13/13   benign  . CESAREAN SECTION    . COLONOSCOPY    . CORONARY ANGIOPLASTY    . CYSTOSCOPY N/A 10/23/2016   Procedure: CYSTOSCOPY;  Surgeon: Royston Cowper, MD;  Location: [facility] ORS;  Service: Urology;  Laterality: N/A;  . CYSTOSCOPY WITH HYDRODISTENSION AND BIOPSY    . LAPAROSCOPIC ASSISTED VAGINAL HYSTERECTOMY N/A 08/20/2016   Procedure: LAPAROSCOPIC ASSISTED VAGINAL HYSTERECTOMY;  Surgeon: Boykin Nearing, MD;  Location: [facility] ORS;  Service: Gynecology;  Laterality: N/A;  . LAPAROSCOPIC BILATERAL SALPINGECTOMY Bilateral 08/20/2016   Procedure: LAPAROSCOPIC BILATERAL SALPINGECTOMY;  Surgeon: Boykin Nearing, MD;  Location: [facility] ORS;  Service: Gynecology;  Laterality: Bilateral;  . NASAL SINUS SURGERY  x12    Prior to Admission medications   Medication Sig Start Date End Date Taking? Authorizing Provider  albuterol (PROVENTIL HFA;VENTOLIN HFA) 108 (90 Base) MCG/ACT inhaler Inhale 2 puffs into the lungs every 6 (six) hours as needed for wheezing or shortness of breath. 04/09/17   Merlyn Lot, MD  ALPRAZolam Duanne Moron) 0.5 MG  tablet Take 1 tablet (0.5 mg total) by mouth as directed. Take it only once a day as needed for severe panic symptoms 06/08/19   Ursula Alert, MD  azelastine (ASTELIN) 0.1 % nasal spray  10/16/18   [provider]  azelastine (OPTIVAR) 0.05 % ophthalmic solution Place 1 drop into both eyes daily.    [provider]  Azelastine HCl 0.15 % SOLN Place 2 sprays into the nose 2 (two) times daily.     [provider]  Biotin 10000 MCG TABS Take 10,000 mg by mouth daily.    [provider]  candesartan (ATACAND) 8 MG tablet Take by mouth. 01/26/19 01/26/20  [provider]  cetirizine (ZYRTEC) 10 MG tablet Take 2 tablets by mouth 2 (two) times daily.     [provider]  Cholecalciferol (VITAMIN D-1000 MAX ST) 1000 units tablet Take 1 tablet by mouth 2 (two) times daily.  11/19/14   [provider]  CRANBERRY PO Take 1 capsule by mouth at bedtime.     [provider]  cyanocobalamin (,VITAMIN B-12,) 1000 MCG/ML injection INJECT ONE ML INTO THE MUSCLE ONCE WEEKLY FOR FOUR WEEKS. THEN INJECT 1ML INTO THE MUSCLE ONCE MONTHY FOR FOUR MONTHS 02/05/19   [provider]  desvenlafaxine (PRISTIQ) 100 MG 24 hr tablet Take 1 tablet (100 mg total) by mouth daily. 06/22/19   Ursula Alert, MD  doxycycline (ADOXA) 100 MG tablet TAKE 1 TABLET BY MOUTH TWICE DAILY FOR 21 DAYS 08/03/19   [provider]  doxycycline (MONODOX) 100 MG capsule Take 100 mg by mouth 2 (two) times daily. 04/27/18   [provider]  EPINEPHrine 0.3 mg/0.3 mL IJ SOAJ injection Inject 0.3 mLs (0.3 mg total) into the muscle once. 02/13/16   Nance Pear, MD  erythromycin ophthalmic ointment Use 1/4" ribbon to affected eye & sutures if present 2x/day for 14 days. If you develop allergy symptoms, stop & call our office. 05/09/18   [provider]  fluticasone (FLONASE) 50 MCG/ACT nasal spray Place 1 spray into both nostrils 2 (two) times daily.      [provider]  fluticasone (FLOVENT HFA) 220 MCG/ACT inhaler INHALE 2 PUFFS BY MOUTH TWICE DAILY 03/30/18   [provider]  Fluticasone-Salmeterol (ADVAIR) 250-50 MCG/DOSE AEPB Inhale 1 puff into the lungs 2 (two) times daily.    [provider]  Galcanezumab-gnlm 120 MG/ML SOAJ Inject into the skin. 08/14/18   [provider]  hydrochlorothiazide (HYDRODIURIL) 25 MG tablet Take 25 mg by mouth daily.    [provider]  hydrOXYzine (ATARAX/VISTARIL) 50 MG tablet hydroxyzine HCl 50 mg tablet    [provider]  Insulin Pen Needle (FIFTY50 PEN NEEDLES) 32G X 4 MM MISC by Does not apply route. 12/05/18   [provider]  levocetirizine (XYZAL) 5 MG tablet Take 2 tablets by mouth twice daily 07/17/19   [provider]  levothyroxine (SYNTHROID, LEVOTHROID) 50 MCG tablet Take 50 mcg daily, any generic manufacturer is okay but try to stay with same manufacturer as much as possible 04/15/18   [provider]  Lidocaine HCl 1 % GEL Apply 1  inch topically daily for 5 days. 09/10/19 09/15/19  Lannie Fields, PA-C  Liraglutide -Weight Management (SAXENDA) 18 MG/3ML SOPN Inject into the skin. 12/05/18   [provider]  magnesium oxide (MAG-OX) 400 MG tablet Take 400 mg by mouth daily.    [provider]  methylPREDNISolone (MEDROL DOSEPAK) 4 MG TBPK tablet  11/26/18   [provider]  metoCLOPramide (REGLAN) 5 MG tablet TAKE 1 TABLET BY MOUTH EVERY 6 HOURS AS NEEDED FOR MIGRAINE HEADACHE 05/09/18   [provider]  montelukast (SINGULAIR) 10 MG tablet Take 10 mg by mouth at bedtime.     [provider]  Multiple Vitamins-Minerals (ZINC PO) Take 1 tablet by mouth daily.    [provider]  nitrofurantoin, macrocrystal-monohydrate, (MACROBID) 100 MG capsule Take by mouth.    [provider]  ofloxacin (FLOXIN) 0.3 % OTIC solution ofloxacin 0.3 % ear drops    [provider]  omalizumab Arvid Right) 150 MG injection INJECT 300MG  SUBCUTANEOUSLY EVERY 4 WEEKS (GIVEN AT PRESCRIBERS OFFICE) 07/30/18   [provider]  ranitidine (ZANTAC) 150 MG tablet Take 150 mg by mouth as directed. Takes 1 in the morning 1 before each meal and 1 before bedtime    [provider]  RELION PEN NEEDLES 29G X 12MM MISC USE 1 PEN NEEDLE ONCE DAILY AS DIRECTED 12/05/18   [provider]  SAXENDA 18 MG/3ML SOPN  12/08/18   [provider]  temazepam (RESTORIL) 7.5 MG capsule Take 1 capsule (7.5 mg total) by mouth at bedtime as needed for sleep. 03/23/19   Ursula Alert, MD  traMADol (ULTRAM) 50 MG tablet Take 1 tablet (50 mg total) by mouth every 6 (six) hours as needed for up to 3 days. 09/10/19 09/13/19  Lannie Fields, PA-C  vitamin B-12 (CYANOCOBALAMIN) 1000 MCG tablet Take 1,000 mcg by mouth daily.    [provider]  vitamin C (ASCORBIC ACID) 500 MG tablet Take 500 mg by mouth at bedtime.    [provider]    Allergies Bee pollen, Bee venom, Eggs or egg-derived products, Keflex [cephalexin], Lac bovis, Lactase, Milk-related compounds, Penicillins, Shellfish allergy, Soy allergy, Wheat bran, Butalbital-apap-caffeine, Codeine, Ferrous gluconate, Hydrocodone, Iron, Red dye, Iodine, and Latex  Family History  Problem Relation Age of Onset  . Breast cancer Maternal Aunt   . Breast cancer Paternal Aunt   . Breast cancer Maternal Grandmother   . Breast cancer Paternal Grandmother   . Breast cancer Paternal Aunt   . Bipolar disorder Paternal Aunt   . Kidney cancer Maternal Uncle   . Prostate cancer Paternal Grandfather   . Kidney disease Other        father's side  . Aneurysm Mother   . Liver disease Mother   . Drug abuse Mother   . Alcohol abuse Mother   . Anxiety disorder Mother   . Depression Mother   . Cancer Father   . Anxiety disorder Father   . Depression Father     Social History Social History   Tobacco  Use  . Smoking status: Former Smoker    Types: Cigarettes    Quit date: 11/12/2006    Years since quitting: 12.8  . Smokeless tobacco: Never Used  . Tobacco comment: quit 2008  Substance Use Topics  . Alcohol use: No    Alcohol/week: 0.0 standard drinks  . Drug use: No     Review of Systems  Constitutional: No fever/chills Eyes: No visual changes. No discharge  ENT: Patient has facial pain. Cardiovascular: no chest pain. Respiratory: no cough. No SOB. Gastrointestinal: No abdominal pain.  No nausea, no vomiting.  No diarrhea.  No constipation. Genitourinary: Negative for dysuria. No hematuria Musculoskeletal: Negative for musculoskeletal pain. Skin: Negative for rash, abrasions, lacerations, ecchymosis. Neurological: Negative for headaches, focal weakness or numbness.  ____________________________________________   PHYSICAL EXAM:  VITAL SIGNS: ED Triage Vitals  Enc Vitals Group     BP 09/10/19 2006 139/67     Pulse Rate 09/10/19 2006 85     Resp 09/10/19 2006 18     Temp 09/10/19 2006 98.4 F (36.9 C)     Temp Source 09/10/19 2006 Oral     SpO2 09/10/19 2006 97 %     Weight --      Height --      Head Circumference --      Peak Flow --      Pain Score 09/10/19 1958 10     Pain Loc --      Pain Edu? --      Excl. in [place]? --      Constitutional: Alert and oriented. Well appearing and in no acute distress. Eyes: Conjunctivae are normal. PERRL. EOMI. Head: Atraumatic. ENT:      Ears: TMs are pearly.       Nose: No congestion/rhinnorhea.      Mouth/Throat: Mucous membranes are moist.  Neck: No stridor.  No cervical spine tenderness to palpation.  Cardiovascular: Normal rate, regular rhythm. Normal S1 and S2.  Good peripheral circulation. Respiratory: Normal respiratory effort without tachypnea or retractions. Lungs CTAB. Good air entry to the bases with no decreased or absent breath sounds. Gastrointestinal: Bowel sounds 4 quadrants. Soft and nontender to  palpation. No guarding or rigidity. No palpable masses. No distention. No CVA tenderness. Musculoskeletal: Full range of motion to all extremities. No gross deformities appreciated. Neurologic:  Normal speech and language. No gross focal neurologic deficits are appreciated.  Skin:  Skin is warm, dry and intact. No rash noted. Psychiatric: Mood and affect are normal. Speech and behavior are normal. Patient exhibits appropriate insight and judgement.   ____________________________________________   LABS (all labs ordered are listed, but only abnormal results are displayed)  Labs Reviewed - No data to display ____________________________________________  EKG   ____________________________________________  RADIOLOGY   No results found.  ____________________________________________    PROCEDURES  Procedure(s) performed:    Procedures    Medications  traMADol (ULTRAM) tablet 50 mg (50 mg Oral Given 09/10/19 2153)  ondansetron (ZOFRAN-ODT) disintegrating tablet 4 mg (4 mg Oral Given 09/10/19 2153)     ____________________________________________   INITIAL IMPRESSION / ASSESSMENT AND PLAN / ED COURSE  Pertinent labs & imaging results that were available during my care of the patient were reviewed by me and considered in my medical decision making (see chart for details).  Review of the [place] CSRS was performed in accordance of the [place] prior to dispensing any controlled drugs.           Assessment and plan Facial pain 48 year old female presents to the emergency department with facial pain is chronic in nature from nasal polyps for pain management.  Advised patient to follow recommendations of otolaryngology and abstain from anti-inflammatories prior to surgery.  Patient was discharged with a short course of tramadol and lidocaine gel.  All patient questions were answered.     ____________________________________________  FINAL CLINICAL IMPRESSION(S) / ED  DIAGNOSES  Final diagnoses:  Facial pain  NEW MEDICATIONS STARTED DURING THIS VISIT:  ED Discharge Orders         Ordered    traMADol (ULTRAM) 50 MG tablet  Every 6 hours PRN     09/10/19 2159    Lidocaine HCl 1 % GEL  Daily     09/10/19 2159              This chart was dictated using voice recognition software/Dragon. Despite best efforts to proofread, errors can occur which can change the meaning. Any change was purely unintentional.    Lannie Fields, PA-C 09/10/19 2316    Harvest Dark, MD 09/11/19 [phone]

## 2019-09-10 NOTE — ED Triage Notes (Signed)
Pt presents to ED via POV with c/o facial pain, pt states is supposed to have surgery on Friday, however her insurance did not approve surgery for tomorrow, pt states was taken off all of her pain meds and is now unable to deal with the pain.

## 2019-09-17 ENCOUNTER — Ambulatory Visit: Payer: 59 | Attending: Neurology

## 2019-09-22 ENCOUNTER — Ambulatory Visit: Payer: 59

## 2019-09-24 ENCOUNTER — Ambulatory Visit: Payer: 59

## 2019-09-28 ENCOUNTER — Ambulatory Visit (INDEPENDENT_AMBULATORY_CARE_PROVIDER_SITE_OTHER): Payer: 59 | Admitting: Psychiatry

## 2019-09-28 ENCOUNTER — Encounter: Payer: Self-pay | Admitting: Psychiatry

## 2019-09-28 ENCOUNTER — Other Ambulatory Visit: Payer: Self-pay

## 2019-09-28 DIAGNOSIS — F40298 Other specified phobia: Secondary | ICD-10-CM

## 2019-09-28 DIAGNOSIS — G4701 Insomnia due to medical condition: Secondary | ICD-10-CM

## 2019-09-28 DIAGNOSIS — F411 Generalized anxiety disorder: Secondary | ICD-10-CM

## 2019-09-28 MED ORDER — TEMAZEPAM 7.5 MG PO CAPS: 7.5000 mg | ORAL_CAPSULE | Freq: Every evening | ORAL | 2 refills | Status: DC | PRN

## 2019-09-28 NOTE — Progress Notes (Signed)
Virtual Visit via Telephone Note  I connected with Kristine [name] on 09/28/19 at  2:35 PM EST by telephone and verified that I am speaking with the correct person using two identifiers.   I discussed the limitations, risks, security and privacy concerns of performing an evaluation and management service by telephone and the availability of in person appointments. I also discussed with the patient that there may be a patient responsible charge related to this service. The patient expressed understanding and agreed to proceed.     I discussed the assessment and treatment plan with the patient. The patient was provided an opportunity to ask questions and all were answered. The patient agreed with the plan and demonstrated an understanding of the instructions.   The patient was advised to call back or seek an in-person evaluation if the symptoms worsen or if the condition fails to improve as anticipated.   [place] MD OP Progress Note  09/28/2019 2:58 PM Kristine [name]  MRN:  [id number]  Chief Complaint:  Chief Complaint    Follow-up     HPI: Kristine [name] is a 48 year old female, married, employed, lives in [place], has a history of GAD, insomnia, needle phobia, VSD, TSH abnormalities, GERD was evaluated by phone today.  Patient preferred to do a phone call.  Patient today reports she recently had nasal surgery.  She is recovering well from the same.  She was on pain medications however currently does not take it anymore.  She reports her mood is okay.  She denies any significant mood swings.  She reports sleep is okay on the current medication regimen.  She takes temazepam as needed on and off.  She denies any suicidality, homicidality or perceptual disturbances.  She reports she was recently started on Topamax for weight loss by her other provider.  She is going to give it a try.  Patient denies any other concerns today.   Visit Diagnosis:    ICD-10-CM   1. GAD (generalized anxiety  disorder)  F41.1   2. Insomnia due to medical condition  G47.01 temazepam (RESTORIL) 7.5 MG capsule  3. Needle phobia  F40.298     Past Psychiatric History: I have reviewed past psychiatric history from my progress note on 12/12/2018  Past Medical History:  Past Medical History:  Diagnosis Date  . Abnormal weight gain 08/05/2013  . Absence of interventricular septum 08/05/2013  . Allergic rhinitis 04/17/2016  . Allergic urticaria due to ingested food 02/09/2016  . Anxiety   . Asthma   . Clinical depression 09/30/2013  . Complication of anesthesia    PT HAD TROUBLE BREATHING AFTER HYSTERECTOMY IN OCTOBER  . Depression   . Dizziness   . Excess weight 09/30/2013  . GERD (gastroesophageal reflux disease)   . Headache, migraine 09/30/2013  . Heart murmur   . Heavy drinker 04/11/2014   Overview:  Patient reports drinking 2-3 bottles of wine each weekend.    . History of methicillin resistant staphylococcus aureus (MRSA) 11/2015  . Infection of urinary tract 09/30/2013  . Irregular bleeding 09/30/2013  . Multiple allergies   . VSD (ventricular septal defect and aortic arch hypoplasia     Past Surgical History:  Procedure Laterality Date  . BOTOX INJECTION N/A 10/23/2016   Procedure: BOTOX INJECTION;  Surgeon: Royston Cowper, MD;  Location: [facility];  Service: Urology;  Laterality: N/A;  . BREAST BIOPSY Left 11/13/13   benign  . CESAREAN SECTION    . COLONOSCOPY    . CORONARY  ANGIOPLASTY    . CYSTOSCOPY N/A 10/23/2016   Procedure: CYSTOSCOPY;  Surgeon: Royston Cowper, MD;  Location: [facility];  Service: Urology;  Laterality: N/A;  . CYSTOSCOPY WITH HYDRODISTENSION AND BIOPSY    . LAPAROSCOPIC ASSISTED VAGINAL HYSTERECTOMY N/A 08/20/2016   Procedure: LAPAROSCOPIC ASSISTED VAGINAL HYSTERECTOMY;  Surgeon: Boykin Nearing, MD;  Location: [facility];  Service: Gynecology;  Laterality: N/A;  . LAPAROSCOPIC BILATERAL SALPINGECTOMY Bilateral 08/20/2016   Procedure: LAPAROSCOPIC BILATERAL  SALPINGECTOMY;  Surgeon: Boykin Nearing, MD;  Location: [facility];  Service: Gynecology;  Laterality: Bilateral;  . NASAL SINUS SURGERY     x12    Family Psychiatric History: Reviewed family psychiatric history from my progress note on 12/12/2018 Family History:  Family History  Problem Relation Age of Onset  . Breast cancer Maternal Aunt   . Breast cancer Paternal Aunt   . Breast cancer Maternal Grandmother   . Breast cancer Paternal Grandmother   . Breast cancer Paternal Aunt   . Bipolar disorder Paternal Aunt   . Kidney cancer Maternal Uncle   . Prostate cancer Paternal Grandfather   . Kidney disease Other        father's side  . Aneurysm Mother   . Liver disease Mother   . Drug abuse Mother   . Alcohol abuse Mother   . Anxiety disorder Mother   . Depression Mother   . Cancer Father   . Anxiety disorder Father   . Depression Father     Social History: Reviewed social history from my progress note on 12/12/2018 Social History   Socioeconomic History  . Marital status: Married    Spouse name: greg   . Number of children: 2  . Years of education: Not on file  . Highest education level: Associate degree: occupational, Hotel manager, or vocational program  Occupational History  . Not on file  Social Needs  . Financial resource strain: Not hard at all  . Food insecurity    Worry: Never true    Inability: Never true  . Transportation needs    Medical: No    Non-medical: No  Tobacco Use  . Smoking status: Former Smoker    Types: Cigarettes    Quit date: 11/12/2006    Years since quitting: 12.8  . Smokeless tobacco: Never Used  . Tobacco comment: quit 2008  Substance and Sexual Activity  . Alcohol use: No    Alcohol/week: 0.0 standard drinks  . Drug use: No  . Sexual activity: Yes    Birth control/protection: None  Lifestyle  . Physical activity    Days per week: 0 days    Minutes per session: 0 min  . Stress: Very much  Relationships  . Social Product manager on phone: Not on file    Gets together: Not on file    Attends religious service: More than 4 times per year    Active member of club or organization: No    Attends meetings of clubs or organizations: Never    Relationship status: Married  Other Topics Concern  . Not on file  Social History Narrative  . Not on file    Allergies:  Allergies  Allergen Reactions  . Bee Pollen Anaphylaxis  . Bee Venom Anaphylaxis  . Eggs Or Egg-Derived Products Anaphylaxis  . Keflex [Cephalexin] Anaphylaxis  . Lac Bovis Anaphylaxis  . Lactase Anaphylaxis and Other (See Comments)  . Milk-Related Compounds Anaphylaxis  . Penicillins Anaphylaxis    Has patient  had a PCN reaction causing immediate rash, facial/tongue/throat swelling, SOB or lightheadedness with hypotension: No Has patient had a PCN reaction causing severe rash involving mucus membranes or skin necrosis: No Has patient had a PCN reaction that required hospitalization No Has patient had a PCN reaction occurring within the last 10 years: Yes If all of the above answers are "NO", then may proceed with Cephalosporin use.   . Shellfish Allergy Anaphylaxis and Other (See Comments)  . Soy Allergy Anaphylaxis  . Wheat Bran Anaphylaxis  . Butalbital-Apap-Caffeine Other (See Comments)    Rebound headache Rebound headache  . Codeine     Other reaction(s): RASH  . Ferrous Gluconate Diarrhea    And vomiting And vomiting  . Hydrocodone Hives  . Iron Diarrhea  . Red Dye Other (See Comments)  . Iodine Rash  . Latex Itching and Rash    Metabolic Disorder Labs: No results found for: HGBA1C, MPG No results found for: PROLACTIN No results found for: CHOL, TRIG, HDL, CHOLHDL, VLDL, LDLCALC Lab Results  Component Value Date   TSH 5.752 (H) 09/19/2017   TSH 1.190 09/04/2016    Therapeutic Level Labs: No results found for: LITHIUM No results found for: VALPROATE No components found for:  CBMZ  Current Medications: Current  Outpatient Medications  Medication Sig Dispense Refill  . albuterol (PROVENTIL HFA;VENTOLIN HFA) 108 (90 Base) MCG/ACT inhaler Inhale 2 puffs into the lungs every 6 (six) hours as needed for wheezing or shortness of breath. 1 Inhaler 2  . ALPRAZolam (XANAX) 0.5 MG tablet Take 1 tablet (0.5 mg total) by mouth as directed. Take it only once a day as needed for severe panic symptoms 10 tablet 0  . azelastine (OPTIVAR) 0.05 % ophthalmic solution Place 1 drop into both eyes daily.    . Biotin 10000 MCG TABS Take 10,000 mg by mouth daily.    . cetirizine (ZYRTEC) 10 MG tablet Take 2 tablets by mouth 2 (two) times daily.     . Cholecalciferol (VITAMIN D-1000 MAX ST) 1000 units tablet Take 1 tablet by mouth 2 (two) times daily.     Marland Kitchen CRANBERRY PO Take 1 capsule by mouth at bedtime.     . cyanocobalamin (,VITAMIN B-12,) 1000 MCG/ML injection INJECT ONE ML INTO THE MUSCLE ONCE WEEKLY FOR FOUR WEEKS. THEN INJECT 1ML INTO THE MUSCLE ONCE MONTHY FOR FOUR MONTHS    . desvenlafaxine (PRISTIQ) 100 MG 24 hr tablet Take 1 tablet (100 mg total) by mouth daily. 90 tablet 1  . EPINEPHrine 0.3 mg/0.3 mL IJ SOAJ injection Inject 0.3 mLs (0.3 mg total) into the muscle once. 1 Device 1  . Fluticasone-Salmeterol (ADVAIR) 250-50 MCG/DOSE AEPB Inhale 1 puff into the lungs 2 (two) times daily.    . Galcanezumab-gnlm 120 MG/ML SOAJ Inject into the skin.    . hydrochlorothiazide (HYDRODIURIL) 25 MG tablet Take 25 mg by mouth daily.    . hydrOXYzine (ATARAX/VISTARIL) 50 MG tablet hydroxyzine HCl 50 mg tablet    . Insulin Pen Needle (FIFTY50 PEN NEEDLES) 32G X 4 MM MISC by Does not apply route.    Marland Kitchen levocetirizine (XYZAL) 5 MG tablet Take 2 tablets by mouth twice daily    . levothyroxine (SYNTHROID, LEVOTHROID) 50 MCG tablet Take 50 mcg daily, any generic manufacturer is okay but try to stay with same manufacturer as much as possible    . Liraglutide -Weight Management (SAXENDA) 18 MG/3ML SOPN Inject into the skin.    .  magnesium oxide (MAG-OX) 400 MG  tablet Take 400 mg by mouth daily.    . methylPREDNISolone (MEDROL DOSEPAK) 4 MG TBPK tablet     . montelukast (SINGULAIR) 10 MG tablet Take 10 mg by mouth at bedtime.     . Multiple Vitamins-Minerals (ZINC PO) Take 1 tablet by mouth daily.    . nitrofurantoin, macrocrystal-monohydrate, (MACROBID) 100 MG capsule Take by mouth.    Marland Kitchen omalizumab (XOLAIR) 150 MG injection INJECT 300MG  SUBCUTANEOUSLY EVERY 4 WEEKS (GIVEN AT PRESCRIBERS OFFICE)    . SAXENDA 18 MG/3ML SOPN     . temazepam (RESTORIL) 7.5 MG capsule Take 1 capsule (7.5 mg total) by mouth at bedtime as needed for sleep. 30 capsule 2  . topiramate (TOPAMAX) 25 MG tablet TAKE 1 2 (ONE HALF) TABLET BY MOUTH IN THE EVENING    . vitamin B-12 (CYANOCOBALAMIN) 1000 MCG tablet Take 1,000 mcg by mouth daily.    . vitamin C (ASCORBIC ACID) 500 MG tablet Take 500 mg by mouth at bedtime.    Marland Kitchen azelastine (ASTELIN) 0.1 % nasal spray     . Azelastine HCl 0.15 % SOLN Place 2 sprays into the nose 2 (two) times daily.     . candesartan (ATACAND) 8 MG tablet Take by mouth.    . doxycycline (ADOXA) 100 MG tablet TAKE 1 TABLET BY MOUTH TWICE DAILY FOR 21 DAYS    . doxycycline (MONODOX) 100 MG capsule Take 100 mg by mouth 2 (two) times daily.  0  . erythromycin ophthalmic ointment Use 1/4" ribbon to affected eye & sutures if present 2x/day for 14 days. If you develop allergy symptoms, stop & call our office.    . fluticasone (FLONASE) 50 MCG/ACT nasal spray Place 1 spray into both nostrils 2 (two) times daily.     . fluticasone (FLOVENT HFA) 220 MCG/ACT inhaler INHALE 2 PUFFS BY MOUTH TWICE DAILY    . metoCLOPramide (REGLAN) 5 MG tablet TAKE 1 TABLET BY MOUTH EVERY 6 HOURS AS NEEDED FOR MIGRAINE HEADACHE  1  . Mometasone Furoate POWD 1.2mg /mL; add 20mL to 252mL saline; irrigate with 139mL solution BID    . ofloxacin (FLOXIN) 0.3 % OTIC solution ofloxacin 0.3 % ear drops    . oxyCODONE (OXY IR/ROXICODONE) 5 MG immediate release  tablet Take 5 mg by mouth every 6 (six) hours as needed.    . ranitidine (ZANTAC) 150 MG tablet Take 150 mg by mouth as directed. Takes 1 in the morning 1 before each meal and 1 before bedtime    . RELION PEN NEEDLES 29G X 12MM MISC USE 1 PEN NEEDLE ONCE DAILY AS DIRECTED     No current facility-administered medications for this visit.      Musculoskeletal: Strength & Muscle Tone: UTA Gait & Station: Reports as WNL Patient leans: N/A  Psychiatric Specialty Exam: Review of Systems  Psychiatric/Behavioral: Negative for depression, hallucinations, substance abuse and suicidal ideas. The patient is not nervous/anxious and does not have insomnia.   All other systems reviewed and are negative.   Last menstrual period 07/17/2016.There is no height or weight on file to calculate BMI.  General Appearance: UTA  Eye Contact:  UTA  Speech:  Clear and Coherent  Volume:  Normal  Mood:  Euthymic  Affect:  UTA  Thought Process:  Goal Directed and Descriptions of Associations: Intact  Orientation:  Full (Time, Place, and Person)  Thought Content: Logical   Suicidal Thoughts:  No  Homicidal Thoughts:  No  Memory:  Immediate;   Fair Recent;  Fair Remote;   Fair  Judgement:  Fair  Insight:  Fair  Psychomotor Activity:  UTA  Concentration:  Concentration: Fair and Attention Span: Fair  Recall:  [employer] of Knowledge: Fair  Language: Fair  Akathisia:  No  Handed:  Right  AIMS (if indicated): Denies tremors, rigidity  Assets:  Communication Skills Desire for Improvement Housing Social Support  ADL's:  Intact  Cognition: WNL  Sleep:  Fair   Screenings:   Assessment and Plan: Kristine [name] is a 48 year old female who has a history of anxiety, multiple medical problems was evaluated by phone today.  Patient is currently doing well on the current medication regimen.    Plan GAD-improving Pristiq 100 mg p.o. daily   Insomnia-stable Temazepam 7.5 mg p.o. nightly as needed  Needle  phobia-unstable-chronic Continue CBT as needed  Follow-up in clinic in 3 months or sooner if needed.  February 8 at 11 AM  I have spent atleast 15 minutes non face to face with patient today. More than 50 % of the time was spent for psychoeducation and supportive psychotherapy and care coordination. This note was generated in part or whole with voice recognition software. Voice recognition is usually quite accurate but there are transcription errors that can and very often do occur. I apologize for any typographical errors that were not detected and corrected.       Ursula Alert, MD 09/28/2019, 2:58 PM

## 2019-11-10 ENCOUNTER — Other Ambulatory Visit: Payer: Self-pay

## 2019-11-10 ENCOUNTER — Telehealth: Payer: Self-pay

## 2019-11-10 ENCOUNTER — Ambulatory Visit (INDEPENDENT_AMBULATORY_CARE_PROVIDER_SITE_OTHER): Payer: 59 | Admitting: Licensed Clinical Social Worker

## 2019-11-10 DIAGNOSIS — F411 Generalized anxiety disorder: Secondary | ICD-10-CM

## 2019-11-10 DIAGNOSIS — G4701 Insomnia due to medical condition: Secondary | ICD-10-CM

## 2019-11-10 DIAGNOSIS — F40298 Other specified phobia: Secondary | ICD-10-CM

## 2019-11-10 MED ORDER — QUETIAPINE FUMARATE 25 MG PO TABS: 12.5000 mg | ORAL_TABLET | Freq: Every day | ORAL | 1 refills | Status: DC | PRN

## 2019-11-10 NOTE — Telephone Encounter (Signed)
Returned call to patient.  She reports whenever she wears her mask she feels she has shortness of breath which triggers panic attacks.  Her primary care provider believes these are anxiety attacks and not asthma attacks.  Patient with adverse side effects and allergic reaction to multiple medication trials in the past.  Discussed with patient that she needs to restart psychotherapy sessions.  Advised her to schedule an appointment with Ms. Merleen Nicely her therapist.  Also discussed starting Seroquel 12.5 mg as needed.  Advised patient to limit use.  GeneSight testing results were made use of while making these medication changes.

## 2019-11-10 NOTE — Telephone Encounter (Signed)
pt states she is havig alot of anxiety and that she has been taking the sleep medication to help with her anxiety but she needs something for anxiety. she getting to tired.

## 2019-11-11 ENCOUNTER — Encounter: Payer: Self-pay | Admitting: Licensed Clinical Social Worker

## 2019-11-11 NOTE — Progress Notes (Signed)
Virtual Visit via Video Note  I connected with Kristine [name] on 11/11/19 at  3:30 PM EST by a video enabled telemedicine application and verified that I am speaking with the correct person using two identifiers.   I discussed the limitations of evaluation and management by telemedicine and the availability of in person appointments. The patient expressed understanding and agreed to proceed.  I discussed the assessment and treatment plan with the patient. The patient was provided an opportunity to ask questions and all were answered. The patient agreed with the plan and demonstrated an understanding of the instructions.   The patient was advised to call back or seek an in-person evaluation if the symptoms worsen or if the condition fails to improve as anticipated.  I provided 23 minutes of non-face-to-face time during this encounter.   Kristine Hipp, LCSW    THERAPIST PROGRESS NOTE  Session Time: [id number]  Participation Level: Active  Behavioral Response: NeatAlertAnxious and Irritable  Type of Therapy: Individual Therapy  Treatment Goals addressed: Coping  Interventions: DBT  Summary: Kristine [name] is a 48 y.o. female who presents with continued symptoms related to her diagnosis. Kristine [name] was last seen by LCSW 10/2018, but was scheduled today per MD in the clinic. Kristine [name] reported, "I think I'm fine, but Dr. Shea Evans said I had to talk to you." Kristine [name] went on to state that she has panic attacks when wearing her mask at work, "but it's because I have asthma. I really just can't breathe. And it doesn't happen every time." LCSW validated Kristine [name]'s feelings, and recognized it can be frustrating to be told you have to do something. LCSW asked Kristine [name] to elaborate on her panic attacks during work. She stated it does not happen every day, and she is attempting to talk herself down or distract herself when she starts to feel panic. LCSW validated Kristine [name]'s efforts and encouraged her to try to  Kristine [name] mindfulness activities when she feels she cannot breathe. We discussed what that would look like, and Kristine [name] was in agreement with trying those skills. However, Kristine [name] was adamant that she does not need therapy at this time.   Suicidal/Homicidal: No  Therapist Response: Kristine [name] continues to work towards her tx goals, but stated she does not feel therapy will help her at this time. Because of this, we will continue to work on addressing anxiety symptoms on an as needed basis, per client.   Plan: Return again as needed.   Diagnosis: Axis I: Generalized Anxiety Disorder    Axis II: No diagnosis    Kristine Hipp, LCSW 11/11/2019

## 2019-12-04 ENCOUNTER — Telehealth: Payer: Self-pay

## 2019-12-04 ENCOUNTER — Telehealth: Payer: Self-pay | Admitting: Psychiatry

## 2019-12-04 DIAGNOSIS — F411 Generalized anxiety disorder: Secondary | ICD-10-CM

## 2019-12-04 MED ORDER — DESVENLAFAXINE SUCCINATE ER 100 MG PO TB24: 100.0000 mg | ORAL_TABLET | Freq: Every day | ORAL | 1 refills | Status: DC

## 2019-12-04 NOTE — Telephone Encounter (Signed)
What medication are we talking about , please specify ?

## 2019-12-04 NOTE — Telephone Encounter (Signed)
called pt and told her the prices.  pt states to go ahead and do the 90 day generic is fine

## 2019-12-04 NOTE — Telephone Encounter (Signed)
called pharmacy and they stated that they could use a saving card if the rx was for 30 day supply and if the patient is able to get generic instead of name brand. But a new rx would have to be sent

## 2019-12-04 NOTE — Telephone Encounter (Signed)
[facility] called stated that they could do a $90 generic for $78.00

## 2019-12-04 NOTE — Telephone Encounter (Signed)
Sent Pristiq to pharmacy- 90 days

## 2019-12-04 NOTE — Telephone Encounter (Signed)
pt called me and told me that she could not afford the pristiq it was going to be over $300

## 2019-12-07 NOTE — Telephone Encounter (Signed)
pristiq

## 2019-12-07 NOTE — Telephone Encounter (Signed)
I sent it  

## 2019-12-21 ENCOUNTER — Ambulatory Visit (INDEPENDENT_AMBULATORY_CARE_PROVIDER_SITE_OTHER): Payer: 59 | Admitting: Psychiatry

## 2019-12-21 ENCOUNTER — Encounter: Payer: Self-pay | Admitting: Psychiatry

## 2019-12-21 ENCOUNTER — Other Ambulatory Visit: Payer: Self-pay

## 2019-12-21 DIAGNOSIS — F411 Generalized anxiety disorder: Secondary | ICD-10-CM | POA: Diagnosis not present

## 2019-12-21 DIAGNOSIS — G4701 Insomnia due to medical condition: Secondary | ICD-10-CM | POA: Diagnosis not present

## 2019-12-21 DIAGNOSIS — F40298 Other specified phobia: Secondary | ICD-10-CM

## 2019-12-21 DIAGNOSIS — Z91199 Patient's noncompliance with other medical treatment and regimen due to unspecified reason: Secondary | ICD-10-CM

## 2019-12-21 DIAGNOSIS — Z9111 Patient's noncompliance with dietary regimen: Secondary | ICD-10-CM

## 2019-12-21 NOTE — Progress Notes (Signed)
Provider Location : [facility] Patient Location : Home  Virtual Visit via Video Note  I connected with Kristine [name] on 12/21/19 at 11:00 AM EST by a video enabled telemedicine application and verified that I am speaking with the correct person using two identifiers.   I discussed the limitations of evaluation and management by telemedicine and the availability of in person appointments. The patient expressed understanding and agreed to proceed.     I discussed the assessment and treatment plan with the patient. The patient was provided an opportunity to ask questions and all were answered. The patient agreed with the plan and demonstrated an understanding of the instructions.   The patient was advised to call back or seek an in-person evaluation if the symptoms worsen or if the condition fails to improve as anticipated.   [facility] MD OP Progress Note  12/21/2019 12:22 PM Kristine [name]  MRN:  [id number]  Chief Complaint:  Chief Complaint    Follow-up     HPI: Edwin is a 49 year old female, married, employed, lives in [place], has a history of GAD, insomnia, needle phobia, VSD, TSH abnormalities, GERD was evaluated by telemedicine today.  Patient today reports she continues to struggle with anxiety symptoms.  She reports that medications are not really helpful with any of her symptoms.  She however reports she continues to take the Pristiq.  Patient reports she does not follow-up with her therapist anymore since she felt the therapy sessions as not beneficial and she has a high deductible and hence she does not want a huge bill.  She reports she does not know what to do and help to manage her anxiety since medications as well as psychotherapy sessions are not beneficial.  Patient initially reported she wanted to increase her Seroquel however later on reported that she did not want to do that due to the side effect of weight gain.  She reports she is unable to afford her Kirke Shaggy since her  insurance will not pay for it.  Since she is not longer on the weight loss medication she does not want anything that can cause weight gain.   She reports she continues to struggle with sleep problems especially due to her bruxism.  She has been following up with her providers and she believes none of her providers could help her with any of her problems at this time.  Patient denies any suicidality, homicidality or perceptual disturbances.  She reports her husband is supportive and she can talk to him if she needs help  Visit Diagnosis:    ICD-10-CM   1. GAD (generalized anxiety disorder)  F41.1   2. Insomnia due to medical condition  G47.01   3. Needle phobia  F40.298   4. Noncompliance with treatment plan  Z91.11     Past Psychiatric History: I have reviewed past psychiatric history from my progress note on 12/12/2018.  Past Medical History:  Past Medical History:  Diagnosis Date  . Abnormal weight gain 08/05/2013  . Absence of interventricular septum 08/05/2013  . Allergic rhinitis 04/17/2016  . Allergic urticaria due to ingested food 02/09/2016  . Anxiety   . Asthma   . Clinical depression 09/30/2013  . Complication of anesthesia    PT HAD TROUBLE BREATHING AFTER HYSTERECTOMY IN OCTOBER  . Depression   . Dizziness   . Excess weight 09/30/2013  . GERD (gastroesophageal reflux disease)   . Headache, migraine 09/30/2013  . Heart murmur   . Heavy drinker 04/11/2014  Overview:  Patient reports drinking 2-3 bottles of wine each weekend.    . History of methicillin resistant staphylococcus aureus (MRSA) 11/2015  . Infection of urinary tract 09/30/2013  . Irregular bleeding 09/30/2013  . Multiple allergies   . VSD (ventricular septal defect and aortic arch hypoplasia     Past Surgical History:  Procedure Laterality Date  . BOTOX INJECTION N/A 10/23/2016   Procedure: BOTOX INJECTION;  Surgeon: Royston Cowper, MD;  Location: [facility];  Service: Urology;  Laterality: N/A;  .  BREAST BIOPSY Left 11/13/13   benign  . CESAREAN SECTION    . COLONOSCOPY    . CORONARY ANGIOPLASTY    . CYSTOSCOPY N/A 10/23/2016   Procedure: CYSTOSCOPY;  Surgeon: Royston Cowper, MD;  Location: [facility];  Service: Urology;  Laterality: N/A;  . CYSTOSCOPY WITH HYDRODISTENSION AND BIOPSY    . LAPAROSCOPIC ASSISTED VAGINAL HYSTERECTOMY N/A 08/20/2016   Procedure: LAPAROSCOPIC ASSISTED VAGINAL HYSTERECTOMY;  Surgeon: Boykin Nearing, MD;  Location: [facility];  Service: Gynecology;  Laterality: N/A;  . LAPAROSCOPIC BILATERAL SALPINGECTOMY Bilateral 08/20/2016   Procedure: LAPAROSCOPIC BILATERAL SALPINGECTOMY;  Surgeon: Boykin Nearing, MD;  Location: [facility];  Service: Gynecology;  Laterality: Bilateral;  . NASAL SINUS SURGERY     x12    Family Psychiatric History: Reviewed family psychiatric history from my progress note on 12/12/2018.  Family History:  Family History  Problem Relation Age of Onset  . Breast cancer Maternal Aunt   . Breast cancer Paternal Aunt   . Breast cancer Maternal Grandmother   . Breast cancer Paternal Grandmother   . Breast cancer Paternal Aunt   . Bipolar disorder Paternal Aunt   . Kidney cancer Maternal Uncle   . Prostate cancer Paternal Grandfather   . Kidney disease Other        father's side  . Aneurysm Mother   . Liver disease Mother   . Drug abuse Mother   . Alcohol abuse Mother   . Anxiety disorder Mother   . Depression Mother   . Cancer Father   . Anxiety disorder Father   . Depression Father     Social History: Reviewed social history from my progress note on 12/12/2018. Social History   Socioeconomic History  . Marital status: Married    Spouse name: greg   . Number of children: 2  . Years of education: Not on file  . Highest education level: Associate degree: occupational, Hotel manager, or vocational program  Occupational History  . Not on file  Tobacco Use  . Smoking status: Former Smoker    Types: Cigarettes    Quit  date: 11/12/2006    Years since quitting: 13.1  . Smokeless tobacco: Never Used  . Tobacco comment: quit 2008  Substance and Sexual Activity  . Alcohol use: No    Alcohol/week: 0.0 standard drinks  . Drug use: No  . Sexual activity: Yes    Birth control/protection: None  Other Topics Concern  . Not on file  Social History Narrative  . Not on file   Social Determinants of Health   Financial Resource Strain:   . Difficulty of Paying Living Expenses: Not on file  Food Insecurity:   . Worried About Charity fundraiser in the Last Year: Not on file  . Ran Out of Food in the Last Year: Not on file  Transportation Needs:   . Lack of Transportation (Medical): Not on file  . Lack of Transportation (Non-Medical): Not on file  Physical Activity:   . Days of Exercise per Week: Not on file  . Minutes of Exercise per Session: Not on file  Stress:   . Feeling of Stress : Not on file  Social Connections:   . Frequency of Communication with Friends and Family: Not on file  . Frequency of Social Gatherings with Friends and Family: Not on file  . Attends Religious Services: Not on file  . Active Member of Clubs or Organizations: Not on file  . Attends Archivist Meetings: Not on file  . Marital Status: Not on file    Allergies:  Allergies  Allergen Reactions  . Bee Pollen Anaphylaxis  . Bee Venom Anaphylaxis  . Eggs Or Egg-Derived Products Anaphylaxis  . Keflex [Cephalexin] Anaphylaxis  . Lac Bovis Anaphylaxis  . Lactase Anaphylaxis and Other (See Comments)  . Milk-Related Compounds Anaphylaxis  . Penicillins Anaphylaxis    Has patient had a PCN reaction causing immediate rash, facial/tongue/throat swelling, SOB or lightheadedness with hypotension: No Has patient had a PCN reaction causing severe rash involving mucus membranes or skin necrosis: No Has patient had a PCN reaction that required hospitalization No Has patient had a PCN reaction occurring within the last 10  years: Yes If all of the above answers are "NO", then may proceed with Cephalosporin use.   . Shellfish Allergy Anaphylaxis and Other (See Comments)  . Soy Allergy Anaphylaxis  . Wheat Bran Anaphylaxis  . Butalbital-Apap-Caffeine Other (See Comments)    Rebound headache Rebound headache  . Codeine     Other reaction(s): RASH  . Ferrous Gluconate Diarrhea    And vomiting And vomiting  . Hydrocodone Hives  . Iron Diarrhea  . Red Dye Other (See Comments)  . Iodine Rash  . Latex Itching and Rash    Metabolic Disorder Labs: No results found for: HGBA1C, MPG No results found for: PROLACTIN No results found for: CHOL, TRIG, HDL, CHOLHDL, VLDL, LDLCALC Lab Results  Component Value Date   TSH 5.752 (H) 09/19/2017   TSH 1.190 09/04/2016    Therapeutic Level Labs: No results found for: LITHIUM No results found for: VALPROATE No components found for:  CBMZ  Current Medications: Current Outpatient Medications  Medication Sig Dispense Refill  . budesonide (PULMICORT) 1 MG/2ML nebulizer solution Inhale into the lungs.    Marland Kitchen ipratropium (ATROVENT HFA) 17 MCG/ACT inhaler Inhale into the lungs.    . Nebulizers (VIOS LC SPRINT DELUXE) MISC To be used with budesonide.    . predniSONE (DELTASONE) 10 MG tablet 20 mg once daily X 5 days, then 10 mg x 10 days then 5 mg X 10 days    . albuterol (PROVENTIL HFA;VENTOLIN HFA) 108 (90 Base) MCG/ACT inhaler Inhale 2 puffs into the lungs every 6 (six) hours as needed for wheezing or shortness of breath. 1 Inhaler 2  . ALPRAZolam (XANAX) 0.5 MG tablet Take 1 tablet (0.5 mg total) by mouth as directed. Take it only once a day as needed for severe panic symptoms 10 tablet 0  . ATROVENT HFA 17 MCG/ACT inhaler     . azelastine (ASTELIN) 0.1 % nasal spray     . azelastine (OPTIVAR) 0.05 % ophthalmic solution Place 1 drop into both eyes daily.    . Azelastine HCl 0.15 % SOLN Place 2 sprays into the nose 2 (two) times daily.     . Biotin 10000 MCG TABS Take  10,000 mg by mouth daily.    . candesartan (ATACAND) 8 MG tablet Take  by mouth.    . cetirizine (ZYRTEC) 10 MG tablet Take 2 tablets by mouth 2 (two) times daily.     . Cholecalciferol (VITAMIN D-1000 MAX ST) 1000 units tablet Take 1 tablet by mouth 2 (two) times daily.     Marland Kitchen CRANBERRY PO Take 1 capsule by mouth at bedtime.     . cyanocobalamin (,VITAMIN B-12,) 1000 MCG/ML injection INJECT ONE ML INTO THE MUSCLE ONCE WEEKLY FOR FOUR WEEKS. THEN INJECT 1ML INTO THE MUSCLE ONCE MONTHY FOR FOUR MONTHS    . desvenlafaxine (PRISTIQ) 100 MG 24 hr tablet Take 1 tablet (100 mg total) by mouth daily. 90 tablet 1  . doxycycline (ADOXA) 100 MG tablet TAKE 1 TABLET BY MOUTH TWICE DAILY FOR 21 DAYS    . doxycycline (MONODOX) 100 MG capsule Take 100 mg by mouth 2 (two) times daily.  0  . EPINEPHrine 0.3 mg/0.3 mL IJ SOAJ injection Inject 0.3 mLs (0.3 mg total) into the muscle once. 1 Device 1  . erythromycin ophthalmic ointment Use 1/4" ribbon to affected eye & sutures if present 2x/day for 14 days. If you develop allergy symptoms, stop & call our office.    . fluticasone (FLONASE) 50 MCG/ACT nasal spray Place 1 spray into both nostrils 2 (two) times daily.     . fluticasone (FLOVENT HFA) 220 MCG/ACT inhaler INHALE 2 PUFFS BY MOUTH TWICE DAILY    . Fluticasone-Salmeterol (ADVAIR) 250-50 MCG/DOSE AEPB Inhale 1 puff into the lungs 2 (two) times daily.    Marland Kitchen gabapentin (NEURONTIN) 250 MG/5ML solution gabapentin 250 mg/5 mL oral solution    . Galcanezumab-gnlm 120 MG/ML SOAJ Inject into the skin.    . hydrochlorothiazide (HYDRODIURIL) 25 MG tablet Take 25 mg by mouth daily.    . hydrOXYzine (ATARAX/VISTARIL) 50 MG tablet hydroxyzine HCl 50 mg tablet    . Insulin Pen Needle (FIFTY50 PEN NEEDLES) 32G X 4 MM MISC by Does not apply route.    Marland Kitchen levocetirizine (XYZAL) 5 MG tablet Take 2 tablets by mouth twice daily    . levothyroxine (SYNTHROID, LEVOTHROID) 50 MCG tablet Take 50 mcg daily, any generic manufacturer is  okay but try to stay with same manufacturer as much as possible    . Liraglutide -Weight Management (SAXENDA) 18 MG/3ML SOPN Inject into the skin.    . magnesium oxide (MAG-OX) 400 MG tablet Take 400 mg by mouth daily.    . methylPREDNISolone (MEDROL DOSEPAK) 4 MG TBPK tablet     . Mometasone Furoate POWD 1.2mg /mL; add 10mL to 29mL saline; irrigate with 117mL solution BID    . montelukast (SINGULAIR) 10 MG tablet Take 10 mg by mouth at bedtime.     . Multiple Vitamins-Minerals (ZINC PO) Take 1 tablet by mouth daily.    . NEOMYCIN-POLYMYXIN-HYDROCORTISONE (CORTISPORIN) 1 % SOLN OTIC solution neomycin-polymyxin-hydrocort 3.5 mg/mL-10,000 unit/mL-1 % ear solution    . nitrofurantoin, macrocrystal-monohydrate, (MACROBID) 100 MG capsule Take by mouth.    Marland Kitchen ofloxacin (FLOXIN) 0.3 % OTIC solution ofloxacin 0.3 % ear drops    . omalizumab (XOLAIR) 150 MG injection INJECT 300MG  SUBCUTANEOUSLY EVERY 4 WEEKS (GIVEN AT PRESCRIBERS OFFICE)    . oxyCODONE (OXY IR/ROXICODONE) 5 MG immediate release tablet Take 5 mg by mouth every 6 (six) hours as needed.    . Pediatric Multiple Vit-C-FA (CHEWABLE VITE CHILDRENS) CHEW Chew by mouth.    . potassium chloride (KLOR-CON) 10 MEQ tablet potassium chloride ER 10 mEq tablet,extended release    . ranitidine (ZANTAC) 150 MG tablet  Take 150 mg by mouth as directed. Takes 1 in the morning 1 before each meal and 1 before bedtime    . RELION PEN NEEDLES 29G X 12MM MISC USE 1 PEN NEEDLE ONCE DAILY AS DIRECTED    . SAXENDA 18 MG/3ML SOPN     . temazepam (RESTORIL) 7.5 MG capsule Take 1 capsule (7.5 mg total) by mouth at bedtime as needed for sleep. 30 capsule 2  . topiramate (TOPAMAX) 25 MG tablet TAKE 1 2 (ONE HALF) TABLET BY MOUTH IN THE EVENING    . vitamin B-12 (CYANOCOBALAMIN) 1000 MCG tablet Take 1,000 mcg by mouth daily.    . vitamin C (ASCORBIC ACID) 500 MG tablet Take 500 mg by mouth at bedtime.     No current facility-administered medications for this visit.      Musculoskeletal: Strength & Muscle Tone: UTA Gait & Station: normal Patient leans: N/A  Psychiatric Specialty Exam: Review of Systems  Psychiatric/Behavioral: Positive for sleep disturbance. The patient is nervous/anxious.   All other systems reviewed and are negative.   Last menstrual period 07/17/2016.There is no height or weight on file to calculate BMI.  General Appearance: Casual  Eye Contact:  Fair  Speech:  Clear and Coherent  Volume:  Normal  Mood:  Anxious  Affect:  Congruent  Thought Process:  Goal Directed and Descriptions of Associations: Intact  Orientation:  Full (Time, Place, and Person)  Thought Content: Logical   Suicidal Thoughts:  No  Homicidal Thoughts:  No  Memory:  Immediate;   Fair Recent;   Fair Remote;   Fair  Judgement:  Fair  Insight:  Fair  Psychomotor Activity:  Normal  Concentration:  Concentration: Fair and Attention Span: Fair  Recall:  [employer] of Knowledge: Fair  Language: Fair  Akathisia:  No  Handed:  Right  AIMS (if indicated): denies tremors, rigidity  Assets:  Communication Skills Desire for Improvement Housing Social Support  ADL's:  Intact  Cognition: WNL  Sleep:  Restless   Screenings:   Assessment and Plan: Adamae is a 49 year old female, has a history of anxiety, multiple medical problems was evaluated by telemedicine today.  Patient with adverse side effects to multiple medication trials in the past as well as noncompliance with medications, today reports that she continues to struggle with anxiety and however does not want to be compliant with therapy sessions that she believes they are not beneficial.  Patient also does not want to be on medications that can cause weight gain.  Patient has failed or is allergic to multiple medication trials in the past.  Patient today agrees to return to the primary care provider and will establish care with a psychotherapist as needed.  Plan as noted below.  Plan GAD-some  progress Pristiq 100 mg p.o. daily  Insomnia-restless She is noncompliant with medications. She is not interested in medications today.  Needle phobia-unstable-chronic Continue CBT as needed  Discussed with patient since she has tried and failed multiple medication trials or is allergic or is worried about weight gain side effects of medications to establish care or follow-up with her current psychotherapist on a regular basis and make use of psychotherapy sessions for her anxiety.    We will transition patient back to her primary care provider.  I have spent atleast 15 minutes non face to face with patient today. More than 50 % of the time was spent for ordering medications and test ,psychoeducation and supportive psychotherapy and care coordination,as well as documenting  clinical information in electronic health record. This note was generated in part or whole with voice recognition software. Voice recognition is usually quite accurate but there are transcription errors that can and very often do occur. I apologize for any typographical errors that were not detected and corrected.       Ursula Alert, MD 12/21/2019, 12:22 PM

## 2020-06-30 ENCOUNTER — Other Ambulatory Visit
Admission: RE | Admit: 2020-06-30 | Discharge: 2020-06-30 | Disposition: A | Discharge status: Home / Self Care | Payer: 59 | Source: Ambulatory Visit | Attending: Gastroenterology | Admitting: Gastroenterology

## 2020-06-30 ENCOUNTER — Other Ambulatory Visit: Payer: Self-pay

## 2020-06-30 DIAGNOSIS — Z01812 Encounter for preprocedural laboratory examination: Secondary | ICD-10-CM | POA: Insufficient documentation

## 2020-06-30 DIAGNOSIS — Z20822 Contact with and (suspected) exposure to covid-19: Secondary | ICD-10-CM | POA: Insufficient documentation

## 2020-07-01 ENCOUNTER — Other Ambulatory Visit: Payer: 59

## 2020-07-01 LAB — SARS CORONAVIRUS 2 (TAT 6-24 HRS): SARS Coronavirus 2: NEGATIVE

## 2020-07-04 ENCOUNTER — Ambulatory Visit: Payer: 59 | Admitting: Anesthesiology

## 2020-07-04 ENCOUNTER — Other Ambulatory Visit: Payer: Self-pay

## 2020-07-04 ENCOUNTER — Encounter
Admission: RE | Disposition: A | Discharge status: Home / Self Care | Payer: Self-pay | Source: Home / Self Care | Attending: Gastroenterology

## 2020-07-04 ENCOUNTER — Ambulatory Visit
Admission: RE | Admit: 2020-07-04 | Discharge: 2020-07-04 | Disposition: A | Discharge status: Home / Self Care | Payer: 59 | Attending: Gastroenterology | Admitting: Gastroenterology

## 2020-07-04 DIAGNOSIS — Z1211 Encounter for screening for malignant neoplasm of colon: Secondary | ICD-10-CM | POA: Diagnosis not present

## 2020-07-04 DIAGNOSIS — K219 Gastro-esophageal reflux disease without esophagitis: Secondary | ICD-10-CM | POA: Diagnosis not present

## 2020-07-04 DIAGNOSIS — Z79899 Other long term (current) drug therapy: Secondary | ICD-10-CM | POA: Diagnosis not present

## 2020-07-04 DIAGNOSIS — Z7951 Long term (current) use of inhaled steroids: Secondary | ICD-10-CM | POA: Insufficient documentation

## 2020-07-04 DIAGNOSIS — D124 Benign neoplasm of descending colon: Secondary | ICD-10-CM | POA: Diagnosis not present

## 2020-07-04 DIAGNOSIS — F329 Major depressive disorder, single episode, unspecified: Secondary | ICD-10-CM | POA: Diagnosis not present

## 2020-07-04 DIAGNOSIS — K640 First degree hemorrhoids: Secondary | ICD-10-CM | POA: Diagnosis not present

## 2020-07-04 DIAGNOSIS — F419 Anxiety disorder, unspecified: Secondary | ICD-10-CM | POA: Insufficient documentation

## 2020-07-04 DIAGNOSIS — Z8614 Personal history of Methicillin resistant Staphylococcus aureus infection: Secondary | ICD-10-CM | POA: Diagnosis not present

## 2020-07-04 DIAGNOSIS — J45909 Unspecified asthma, uncomplicated: Secondary | ICD-10-CM | POA: Diagnosis not present

## 2020-07-04 HISTORY — PX: COLONOSCOPY WITH PROPOFOL: SHX5780

## 2020-07-04 SURGERY — COLONOSCOPY WITH PROPOFOL
Anesthesia: General

## 2020-07-04 MED ORDER — SODIUM CHLORIDE 0.9 % IV SOLN
INTRAVENOUS | Status: DC
  Administered 2020-07-04: 20 mL/h via INTRAVENOUS

## 2020-07-04 MED ORDER — PROPOFOL 500 MG/50ML IV EMUL
INTRAVENOUS | Status: AC
  Filled 2020-07-04: qty 50

## 2020-07-04 MED ORDER — LIDOCAINE HCL (PF) 2 % IJ SOLN
INTRAMUSCULAR | Status: AC
  Filled 2020-07-04: qty 5

## 2020-07-04 MED ORDER — PROPOFOL 10 MG/ML IV BOLUS
INTRAVENOUS | Status: AC
  Filled 2020-07-04: qty 20

## 2020-07-04 MED ORDER — PROPOFOL 10 MG/ML IV BOLUS
INTRAVENOUS | Status: DC | PRN
  Administered 2020-07-04 (×2): 100 mg via INTRAVENOUS

## 2020-07-04 MED ORDER — PROPOFOL 500 MG/50ML IV EMUL
INTRAVENOUS | Status: DC | PRN
  Administered 2020-07-04: 125 ug/kg/min via INTRAVENOUS

## 2020-07-04 MED ORDER — LIDOCAINE HCL (CARDIAC) PF 100 MG/5ML IV SOSY
PREFILLED_SYRINGE | INTRAVENOUS | Status: DC | PRN
  Administered 2020-07-04: 30 mg via INTRAVENOUS

## 2020-07-04 NOTE — Anesthesia Postprocedure Evaluation (Signed)
Anesthesia Post Note  Patient: Kristine [name]  Procedure(s) Performed: COLONOSCOPY WITH PROPOFOL (N/A )  Patient location during evaluation: PACU Anesthesia Type: General Level of consciousness: awake and alert Pain management: pain level controlled Vital Signs Assessment: post-procedure vital signs reviewed and stable Respiratory status: spontaneous breathing, nonlabored ventilation, respiratory function stable and patient connected to nasal cannula oxygen Cardiovascular status: blood pressure returned to baseline and stable Postop Assessment: no apparent nausea or vomiting Anesthetic complications: no   No complications documented.   Last Vitals:  Vitals:   07/04/20 1043 07/04/20 1255  BP: (!) 129/115   Pulse: 81   Resp: 20   Temp: 36.5 C 36.8 C  SpO2: 98%     Last Pain:  Vitals:   07/04/20 1255  TempSrc: Temporal  PainSc: Amidon Hollister Wessler

## 2020-07-04 NOTE — Transfer of Care (Signed)
Immediate Anesthesia Transfer of Care Note  Patient: Kristine [name]  Procedure(s) Performed: COLONOSCOPY WITH PROPOFOL (N/A )  Patient Location: PACU and Endoscopy Unit  Anesthesia Type:MAC  Level of Consciousness: awake and alert   Airway & Oxygen Therapy: Patient Spontanous Breathing  Post-op Assessment: Report given to RN and Post -op Vital signs reviewed and stable  Post vital signs: Reviewed and stable  Last Vitals:  Vitals Value Taken Time  BP    Temp    Pulse    Resp    SpO2      Last Pain:  Vitals:   07/04/20 1043  TempSrc: Temporal  PainSc: 5          Complications: No complications documented.

## 2020-07-04 NOTE — Interval H&P Note (Signed)
History and Physical Interval Note:  07/04/2020 12:05 PM  Kristine [name]  has presented today for surgery, with the diagnosis of COLON CANCER SCREENING.  The various methods of treatment have been discussed with the patient and family. After consideration of risks, benefits and other options for treatment, the patient has consented to  Procedure(s): COLONOSCOPY WITH PROPOFOL (N/A) as a surgical intervention.  The patient's history has been reviewed, patient examined, no change in status, stable for surgery.  I have reviewed the patient's chart and labs.  Questions were answered to the patient's satisfaction.     Lesly Rubenstein  Ok to proceed with colonoscopy.

## 2020-07-04 NOTE — H&P (Signed)
Outpatient short stay form Pre-procedure 07/04/2020 12:01 PM Kristine Miyamoto MD, MPH  Primary Physician: Dr. [name]  Reason for visit:  Screening  History of present illness:   49 y/o lady with history of multiple medical problems here for colon cancer screening. History of colon cancer in grandfather in his 49's. History of hysterectomy and c-sections. No GI symptoms. No blood thinners.    Current Facility-Administered Medications:  .  0.9 %  sodium chloride infusion, , Intravenous, Continuous, Trishia Cuthrell, Hilton Cork, MD, Last Rate: 20 mL/hr at 07/04/20 1055, 20 mL/hr at 07/04/20 1055  Medications Prior to Admission  Medication Sig Dispense Refill Last Dose  . albuterol (PROVENTIL HFA;VENTOLIN HFA) 108 (90 Base) MCG/ACT inhaler Inhale 2 puffs into the lungs every 6 (six) hours as needed for wheezing or shortness of breath. 1 Inhaler 2 Past Week at Unknown time  . ALPRAZolam (XANAX) 0.5 MG tablet Take 1 tablet (0.5 mg total) by mouth as directed. Take it only once a day as needed for severe panic symptoms 10 tablet 0 07/03/2020 at Unknown time  . ATROVENT HFA 17 MCG/ACT inhaler    07/03/2020 at Unknown time  . azelastine (ASTELIN) 0.1 % nasal spray    07/03/2020 at Unknown time  . azelastine (OPTIVAR) 0.05 % ophthalmic solution Place 1 drop into both eyes daily.   07/03/2020 at Unknown time  . Azelastine HCl 0.15 % SOLN Place 2 sprays into the nose 2 (two) times daily.    07/03/2020 at Unknown time  . Biotin 10000 MCG TABS Take 10,000 mg by mouth daily.   Past Week at Unknown time  . budesonide (PULMICORT) 1 MG/2ML nebulizer solution Inhale into the lungs.   Past Week at Unknown time  . cetirizine (ZYRTEC) 10 MG tablet Take 2 tablets by mouth 2 (two) times daily.    07/04/2020 at 0500  . Cholecalciferol (VITAMIN D-1000 MAX ST) 1000 units tablet Take 1 tablet by mouth 2 (two) times daily.    Past Week at Unknown time  . CRANBERRY PO Take 1 capsule by mouth at bedtime.    Past Week at Unknown  time  . cyanocobalamin (,VITAMIN B-12,) 1000 MCG/ML injection INJECT ONE ML INTO THE MUSCLE ONCE WEEKLY FOR FOUR WEEKS. THEN INJECT 1ML INTO THE MUSCLE ONCE MONTHY FOR FOUR MONTHS   Past Week at Unknown time  . desvenlafaxine (PRISTIQ) 100 MG 24 hr tablet Take 1 tablet (100 mg total) by mouth daily. 90 tablet 1 07/03/2020 at Unknown time  . doxycycline (ADOXA) 100 MG tablet TAKE 1 TABLET BY MOUTH TWICE DAILY FOR 21 DAYS   07/03/2020 at Unknown time  . doxycycline (MONODOX) 100 MG capsule Take 100 mg by mouth 2 (two) times daily.  0 07/03/2020 at Unknown time  . EPINEPHrine 0.3 mg/0.3 mL IJ SOAJ injection Inject 0.3 mLs (0.3 mg total) into the muscle once. 1 Device 1 Past Month at Unknown time  . erythromycin ophthalmic ointment Use 1/4" ribbon to affected eye & sutures if present 2x/day for 14 days. If you develop allergy symptoms, stop & call our office.   Past Week at Unknown time  . fluticasone (FLONASE) 50 MCG/ACT nasal spray Place 1 spray into both nostrils 2 (two) times daily.    07/04/2020 at 0500  . fluticasone (FLOVENT HFA) 220 MCG/ACT inhaler INHALE 2 PUFFS BY MOUTH TWICE DAILY   Past Week at Unknown time  . Fluticasone-Salmeterol (ADVAIR) 250-50 MCG/DOSE AEPB Inhale 1 puff into the lungs 2 (two) times daily.   07/04/2020  at 0500  . gabapentin (NEURONTIN) 250 MG/5ML solution gabapentin 250 mg/5 mL oral solution   Past Week at Unknown time  . Galcanezumab-gnlm 120 MG/ML SOAJ Inject into the skin.   Past Week at Unknown time  . hydrochlorothiazide (HYDRODIURIL) 25 MG tablet Take 25 mg by mouth daily.   Past Week at Unknown time  . hydrOXYzine (ATARAX/VISTARIL) 50 MG tablet hydroxyzine HCl 50 mg tablet   Past Week at Unknown time  . Insulin Pen Needle (FIFTY50 PEN NEEDLES) 32G X 4 MM MISC by Does not apply route.   Past Week at Unknown time  . ipratropium (ATROVENT HFA) 17 MCG/ACT inhaler Inhale into the lungs.   Past Week at Unknown time  . levocetirizine (XYZAL) 5 MG tablet Take 2 tablets by  mouth twice daily   Past Week at Unknown time  . levothyroxine (SYNTHROID, LEVOTHROID) 50 MCG tablet Take 50 mcg daily, any generic manufacturer is okay but try to stay with same manufacturer as much as possible   Past Week at Unknown time  . Liraglutide -Weight Management (SAXENDA) 18 MG/3ML SOPN Inject into the skin.   Past Week at Unknown time  . magnesium oxide (MAG-OX) 400 MG tablet Take 400 mg by mouth daily.   Past Week at Unknown time  . methylPREDNISolone (MEDROL DOSEPAK) 4 MG TBPK tablet    Past Week at Unknown time  . Mometasone Furoate POWD 1.2mg /mL; add 57mL to 255mL saline; irrigate with 189mL solution BID   Past Week at Unknown time  . montelukast (SINGULAIR) 10 MG tablet Take 10 mg by mouth at bedtime.    07/04/2020 at 0500  . Multiple Vitamins-Minerals (ZINC PO) Take 1 tablet by mouth daily.   Past Week at Unknown time  . Nebulizers (VIOS LC SPRINT DELUXE) MISC To be used with budesonide.   Past Week at Unknown time  . NEOMYCIN-POLYMYXIN-HYDROCORTISONE (CORTISPORIN) 1 % SOLN OTIC solution neomycin-polymyxin-hydrocort 3.5 mg/mL-10,000 unit/mL-1 % ear solution   Past Week at Unknown time  . nitrofurantoin, macrocrystal-monohydrate, (MACROBID) 100 MG capsule Take by mouth.   Past Week at Unknown time  . ofloxacin (FLOXIN) 0.3 % OTIC solution ofloxacin 0.3 % ear drops   Past Week at Unknown time  . omalizumab (XOLAIR) 150 MG injection INJECT 300MG  SUBCUTANEOUSLY EVERY 4 WEEKS (GIVEN AT PRESCRIBERS OFFICE)   Past Week at Unknown time  . oxyCODONE (OXY IR/ROXICODONE) 5 MG immediate release tablet Take 5 mg by mouth every 6 (six) hours as needed.   Past Week at Unknown time  . Pediatric Multiple Vit-C-FA (CHEWABLE VITE CHILDRENS) CHEW Chew by mouth.   Past Week at Unknown time  . potassium chloride (KLOR-CON) 10 MEQ tablet potassium chloride ER 10 mEq tablet,extended release   Past Week at Unknown time  . predniSONE (DELTASONE) 10 MG tablet 20 mg once daily X 5 days, then 10 mg x 10 days  then 5 mg X 10 days   Past Week at Unknown time  . ranitidine (ZANTAC) 150 MG tablet Take 150 mg by mouth as directed. Takes 1 in the morning 1 before each meal and 1 before bedtime   Past Week at Unknown time  . RELION PEN NEEDLES 29G X 12MM MISC USE 1 PEN NEEDLE ONCE DAILY AS DIRECTED   Past Week at Unknown time  . SAXENDA 18 MG/3ML SOPN    Past Week at Unknown time  . temazepam (RESTORIL) 7.5 MG capsule Take 1 capsule (7.5 mg total) by mouth at bedtime as needed for sleep.  30 capsule 2 Past Week at Unknown time  . topiramate (TOPAMAX) 25 MG tablet TAKE 1 2 (ONE HALF) TABLET BY MOUTH IN THE EVENING   Past Week at Unknown time  . vitamin B-12 (CYANOCOBALAMIN) 1000 MCG tablet Take 1,000 mcg by mouth daily.   Past Week at Unknown time  . vitamin C (ASCORBIC ACID) 500 MG tablet Take 500 mg by mouth at bedtime.   Past Week at Unknown time  . candesartan (ATACAND) 8 MG tablet Take by mouth.        Allergies  Allergen Reactions  . Bee Pollen Anaphylaxis  . Bee Venom Anaphylaxis  . Eggs Or Egg-Derived Products Anaphylaxis  . Keflex [Cephalexin] Anaphylaxis  . Lac Bovis Anaphylaxis  . Lactase Anaphylaxis and Other (See Comments)  . Milk-Related Compounds Anaphylaxis  . Penicillins Anaphylaxis    Has patient had a PCN reaction causing immediate rash, facial/tongue/throat swelling, SOB or lightheadedness with hypotension: No Has patient had a PCN reaction causing severe rash involving mucus membranes or skin necrosis: No Has patient had a PCN reaction that required hospitalization No Has patient had a PCN reaction occurring within the last 10 years: Yes If all of the above answers are "NO", then may proceed with Cephalosporin use.   . Shellfish Allergy Anaphylaxis and Other (See Comments)  . Soy Allergy Anaphylaxis  . Wheat Bran Anaphylaxis  . Butalbital-Apap-Caffeine Other (See Comments)    Rebound headache Rebound headache  . Codeine     Other reaction(s): RASH  . Ferrous Gluconate  Diarrhea    And vomiting And vomiting  . Gabapentin Other (See Comments)  . Hydrocodone Hives  . Iron Diarrhea  . Red Dye Other (See Comments)  . Iodine Rash  . Latex Itching and Rash     Past Medical History:  Diagnosis Date  . Abnormal weight gain 08/05/2013  . Absence of interventricular septum 08/05/2013  . Allergic rhinitis 04/17/2016  . Allergic urticaria due to ingested food 02/09/2016  . Anxiety   . Asthma   . Clinical depression 09/30/2013  . Complication of anesthesia    PT HAD TROUBLE BREATHING AFTER HYSTERECTOMY IN OCTOBER  . Depression   . Dizziness   . Excess weight 09/30/2013  . GERD (gastroesophageal reflux disease)   . Headache, migraine 09/30/2013  . Heart murmur   . Heavy drinker 04/11/2014   Overview:  Patient reports drinking 2-3 bottles of wine each weekend.    . History of methicillin resistant staphylococcus aureus (MRSA) 11/2015  . Infection of urinary tract 09/30/2013  . Irregular bleeding 09/30/2013  . Multiple allergies   . VSD (ventricular septal defect and aortic arch hypoplasia     Review of systems:  Otherwise negative.    Physical Exam  Gen: Alert, oriented. Appears stated age.  HEENT: [place]/AT. PERRLA. Lungs: No respiratory distress Abd: soft, benign, no masses. BS+ Ext: No edema. Pulses 2+    Planned procedures: Proceed with colonoscopy. The patient understands the nature of the planned procedure, indications, risks, alternatives and potential complications including but not limited to bleeding, infection, perforation, damage to internal organs and possible oversedation/side effects from anesthesia. The patient agrees and gives consent to proceed.  Please refer to procedure notes for findings, recommendations and patient disposition/instructions.     Kristine Miyamoto MD, MPH Gastroenterology 07/04/2020  12:01 PM

## 2020-07-04 NOTE — Anesthesia Preprocedure Evaluation (Signed)
Anesthesia Evaluation  Patient identified by MRN, date of birth, ID band Patient awake    Reviewed: Allergy & Precautions, H&P , NPO status , Patient's Chart, lab work & pertinent test results, reviewed documented beta blocker date and time   History of Anesthesia Complications (+) history of anesthetic complications  Airway Mallampati: II   Neck ROM: full    Dental  (+) Poor Dentition   Pulmonary neg shortness of breath, asthma , former smoker,    Pulmonary exam normal        Cardiovascular Exercise Tolerance: Good Normal cardiovascular exam+ Valvular Problems/Murmurs  Rhythm:regular Rate:Normal  VSD with no other sx. Described except occassional palpitations.    Neuro/Psych  Headaches, PSYCHIATRIC DISORDERS Anxiety Depression  Neuromuscular disease    GI/Hepatic Neg liver ROS, GERD  Medicated,  Endo/Other  negative endocrine ROS  Renal/GU negative Renal ROS  negative genitourinary   Musculoskeletal   Abdominal   Peds  Hematology negative hematology ROS (+)   Anesthesia Other Findings Past Medical History: 08/05/2013: Abnormal weight gain 08/05/2013: Absence of interventricular septum 04/17/2016: Allergic rhinitis 02/09/2016: Allergic urticaria due to ingested food No date: Anxiety No date: Asthma 09/30/2013: Clinical depression No date: Complication of anesthesia     Comment:  PT HAD TROUBLE BREATHING AFTER HYSTERECTOMY IN OCTOBER No date: Depression No date: Dizziness 09/30/2013: Excess weight No date: GERD (gastroesophageal reflux disease) 09/30/2013: Headache, migraine No date: Heart murmur 04/11/2014: Heavy drinker     Comment:  Overview:  Patient reports drinking 2-3 bottles of wine               each weekend.   11/2015: History of methicillin resistant staphylococcus aureus (MRSA) 09/30/2013: Infection of urinary tract 09/30/2013: Irregular bleeding No date: Multiple allergies No date: VSD  (ventricular septal defect and aortic arch hypoplasia Past Surgical History: 10/23/2016: BOTOX INJECTION; N/A     Comment:  Procedure: BOTOX INJECTION;  Surgeon: Royston Cowper,               MD;  Location: [facility] ORS;  Service: Urology;  Laterality:               N/A; 11/13/13: BREAST BIOPSY; Left     Comment:  benign No date: CESAREAN SECTION No date: COLONOSCOPY No date: CORONARY ANGIOPLASTY 10/23/2016: CYSTOSCOPY; N/A     Comment:  Procedure: CYSTOSCOPY;  Surgeon: Royston Cowper, MD;                Location: [facility] ORS;  Service: Urology;  Laterality: N/A; No date: CYSTOSCOPY WITH HYDRODISTENSION AND BIOPSY 08/20/2016: LAPAROSCOPIC ASSISTED VAGINAL HYSTERECTOMY; N/A     Comment:  Procedure: LAPAROSCOPIC ASSISTED VAGINAL HYSTERECTOMY;                Surgeon: Boykin Nearing, MD;  Location: [facility] ORS;               Service: Gynecology;  Laterality: N/A; 08/20/2016: LAPAROSCOPIC BILATERAL SALPINGECTOMY; Bilateral     Comment:  Procedure: LAPAROSCOPIC BILATERAL SALPINGECTOMY;                Surgeon: Boykin Nearing, MD;  Location: [facility] ORS;               Service: Gynecology;  Laterality: Bilateral; No date: NASAL SINUS SURGERY     Comment:  x12   Reproductive/Obstetrics negative OB ROS  Anesthesia Physical Anesthesia Plan  ASA: II  Anesthesia Plan: General   Post-op Pain Management:    Induction:   PONV Risk Score and Plan:   Airway Management Planned:   Additional Equipment:   Intra-op Plan:   Post-operative Plan:   Informed Consent: I have reviewed the patients History and Physical, chart, labs and discussed the procedure including the risks, benefits and alternatives for the proposed anesthesia with the patient or authorized representative who has indicated his/her understanding and acceptance.     Dental Advisory Given  Plan Discussed with: CRNA  Anesthesia Plan Comments:         Anesthesia  Quick Evaluation

## 2020-07-04 NOTE — Op Note (Signed)
[facility] Gastroenterology Patient Name: Kristine [name] Procedure Date: 07/04/2020 11:52 AM MRN: [id number] Account #: [id number] Date of Birth: Sep 26, 1971 Admit Type: Outpatient Age: 49 Room: [facility] ENDO ROOM 2 Gender: Female Note Status: Finalized Procedure:             Colonoscopy Indications:           Screening for colorectal malignant neoplasm Providers:             Andrey Farmer MD, MD Medicines:             Monitored Anesthesia Care Complications:         No immediate complications. Estimated blood loss:                         Minimal. Procedure:             Pre-Anesthesia Assessment:                        - Prior to the procedure, a History and Physical was                         performed, and patient medications and allergies were                         reviewed. The patient is competent. The risks and                         benefits of the procedure and the sedation options and                         risks were discussed with the patient. All questions                         were answered and informed consent was obtained.                         Patient identification and proposed procedure were                         verified by the physician, the nurse, the anesthetist                         and the technician in the endoscopy suite. Mental                         Status Examination: alert and oriented. Airway                         Examination: normal oropharyngeal airway and neck                         mobility. Respiratory Examination: clear to                         auscultation. CV Examination: normal. Prophylactic                         Antibiotics: The patient does not require prophylactic  antibiotics. Prior Anticoagulants: The patient has                         taken no previous anticoagulant or antiplatelet                         agents. ASA Grade Assessment: III - A patient with                          severe systemic disease. After reviewing the risks and                         benefits, the patient was deemed in satisfactory                         condition to undergo the procedure. The anesthesia                         plan was to use monitored anesthesia care (MAC).                         Immediately prior to administration of medications,                         the patient was re-assessed for adequacy to receive                         sedatives. The heart rate, respiratory rate, oxygen                         saturations, blood pressure, adequacy of pulmonary                         ventilation, and response to care were monitored                         throughout the procedure. The physical status of the                         patient was re-assessed after the procedure.                        After obtaining informed consent, the colonoscope was                         passed under direct vision. Throughout the procedure,                         the patient's blood pressure, pulse, and oxygen                         saturations were monitored continuously. The                         Colonoscope was introduced through the anus and                         advanced to the the cecum, identified by appendiceal  orifice and ileocecal valve. The colonoscopy was                         performed without difficulty. The patient tolerated                         the procedure well. The quality of the bowel                         preparation was good. Findings:      The perianal and digital rectal examinations were normal.      A 15 mm polyp was found in the descending colon. The polyp was       pedunculated. The polyp was removed with a hot snare. Resection and       retrieval were complete. Estimated blood loss was minimal.      Non-bleeding internal hemorrhoids were found during retroflexion. The       hemorrhoids were Grade I (internal hemorrhoids  that do not prolapse).      The exam was otherwise without abnormality on direct and retroflexion       views. Impression:            - One 15 mm polyp in the descending colon, removed                         with a hot snare. Resected and retrieved.                        - Non-bleeding internal hemorrhoids.                        - The examination was otherwise normal on direct and                         retroflexion views. Recommendation:        - Discharge patient to home.                        - Resume previous diet.                        - Continue present medications.                        - Await pathology results.                        - Repeat colonoscopy in 3 years for surveillance.                        - Return to referring physician as previously                         scheduled. Procedure Code(s):     --- Professional ---                        [phone], Colonoscopy, flexible; with removal of                         tumor(s), polyp(s), or other lesion(s) by snare  technique Diagnosis Code(s):     --- Professional ---                        Z12.11, Encounter for screening for malignant neoplasm                         of colon                        K63.5, Polyp of colon                        K64.0, First degree hemorrhoids CPT copyright 2019 [employer]. All rights reserved. The codes documented in this report are preliminary and upon coder review may  be revised to meet current compliance requirements. Andrey Farmer, MD Andrey Farmer MD, MD 07/04/2020 12:56:06 PM Number of Addenda: 0 Note Initiated On: 07/04/2020 11:52 AM Scope Withdrawal Time: 0 hours 24 minutes 19 seconds  Total Procedure Duration: 0 hours 35 minutes 5 seconds  Estimated Blood Loss:  Estimated blood loss: none. Estimated blood loss was                         minimal.      [facility]

## 2020-07-05 LAB — SURGICAL PATHOLOGY

## 2020-07-06 ENCOUNTER — Encounter: Payer: Self-pay | Admitting: Gastroenterology

## 2020-07-07 ENCOUNTER — Other Ambulatory Visit: Payer: Self-pay

## 2020-08-04 ENCOUNTER — Other Ambulatory Visit: Payer: Self-pay | Admitting: Psychiatry

## 2020-08-04 DIAGNOSIS — F411 Generalized anxiety disorder: Secondary | ICD-10-CM

## 2020-08-12 ENCOUNTER — Other Ambulatory Visit
Admission: RE | Admit: 2020-08-12 | Discharge: 2020-08-12 | Disposition: A | Discharge status: Home / Self Care | Payer: 59 | Source: Ambulatory Visit | Attending: Family Medicine | Admitting: Family Medicine

## 2020-08-12 DIAGNOSIS — R0600 Dyspnea, unspecified: Secondary | ICD-10-CM | POA: Diagnosis present

## 2020-08-12 DIAGNOSIS — R42 Dizziness and giddiness: Secondary | ICD-10-CM | POA: Diagnosis present

## 2020-08-12 LAB — FIBRIN DERIVATIVES D-DIMER (ARMC ONLY): Fibrin derivatives D-dimer (ARMC): 400.64 ng/mL (FEU) (ref 0.00–499.00)

## 2020-08-26 ENCOUNTER — Telehealth: Payer: Self-pay

## 2020-08-26 NOTE — Telephone Encounter (Signed)
pt called left a message that you told her to get her medications at her pcp and her pcp states she can not prescribe those medications.  she finially got a refill on the pristiq but it made her crazy and sick.  she states she has to see you to get medication

## 2020-08-26 NOTE — Telephone Encounter (Signed)
Not sure if she needs a referral to be seen, I have not seen her in a long time. Please schedule an appointment. Please schedule 30 minutes follow up .

## 2020-08-29 ENCOUNTER — Other Ambulatory Visit: Payer: Self-pay

## 2020-08-29 ENCOUNTER — Other Ambulatory Visit: Payer: Self-pay | Admitting: Physician Assistant

## 2020-08-29 ENCOUNTER — Encounter: Payer: Self-pay | Admitting: Psychiatry

## 2020-08-29 ENCOUNTER — Telehealth (INDEPENDENT_AMBULATORY_CARE_PROVIDER_SITE_OTHER): Payer: 59 | Admitting: Psychiatry

## 2020-08-29 DIAGNOSIS — F40298 Other specified phobia: Secondary | ICD-10-CM

## 2020-08-29 DIAGNOSIS — F411 Generalized anxiety disorder: Secondary | ICD-10-CM

## 2020-08-29 DIAGNOSIS — G4701 Insomnia due to medical condition: Secondary | ICD-10-CM

## 2020-08-29 DIAGNOSIS — Z1231 Encounter for screening mammogram for malignant neoplasm of breast: Secondary | ICD-10-CM

## 2020-08-29 MED ORDER — DESVENLAFAXINE SUCCINATE ER 100 MG PO TB24: 100.0000 mg | ORAL_TABLET | Freq: Every day | ORAL | 1 refills | Status: DC

## 2020-08-29 NOTE — Progress Notes (Signed)
Provider Location : [facility] Patient Location : Work  Participants: Patient ,Relative, Provider  Virtual Visit via Video Note  I connected with Kristine [name] on 08/29/20 at  3:30 PM EDT by a video enabled telemedicine application and verified that I am speaking with the correct person using two identifiers.   I discussed the limitations of evaluation and management by telemedicine and the availability of in person appointments. The patient expressed understanding and agreed to proceed.     I discussed the assessment and treatment plan with the patient. The patient was provided an opportunity to ask questions and all were answered. The patient agreed with the plan and demonstrated an understanding of the instructions.   The patient was advised to call back or seek an in-person evaluation if the symptoms worsen or if the condition fails to improve as anticipated.   [facility] MD OP Progress Note  08/29/2020 5:16 PM Kristine [name]  MRN:  [id number]  Chief Complaint:  Chief Complaint    Follow-up     HPI: Kristine [name] is a 49 year old female, married, employed, lives in [place], has a history of GAD, insomnia, needle phobia, VSD, TSH abnormalities, GERD was evaluated by telemedicine today.  Patient's last appointment was in February 2021.  Patient today corrected by video at her workplace while she was with her relative.  She however did not introduce the relative but just stated that she was okay with this relative being part of this evaluation today.  Patient reports she quit taking the Pristiq since she could not get a prescription from her primary care provider for almost 3 weeks.  She reports she had withdrawal symptoms and had a lot of anxiety.  She however reports she just got back on it and has been on it since the past 1 to 2 weeks.  She reports she continues to feel anxious however she does not know if being back on the high dosage of Pristiq is causing it or  she is anxious  because the Pristiq has not started treating her anxiety yet.  She reports she wants to give the Pristiq more time and does not want to change anything today.  She reports sleep as good.  Patient denies any suicidality, homicidality or perceptual disturbances.  She reports as long as she takes one client at a time she is able to manage her anxiety at work.  She denies any other concerns today.   Visit Diagnosis:    ICD-10-CM   1. GAD (generalized anxiety disorder)  F41.1 desvenlafaxine (PRISTIQ) 100 MG 24 hr tablet  2. Insomnia due to medical condition  G47.01   3. Needle phobia  F40.298     Past Psychiatric History: I have reviewed past psychiatric history from my progress note on 12/12/2018  Past Medical History:  Past Medical History:  Diagnosis Date  . Abnormal weight gain 08/05/2013  . Absence of interventricular septum 08/05/2013  . Allergic rhinitis 04/17/2016  . Allergic urticaria due to ingested food 02/09/2016  . Anxiety   . Asthma   . Clinical depression 09/30/2013  . Complication of anesthesia    PT HAD TROUBLE BREATHING AFTER HYSTERECTOMY IN OCTOBER  . Depression   . Dizziness   . Excess weight 09/30/2013  . GERD (gastroesophageal reflux disease)   . Headache, migraine 09/30/2013  . Heart murmur   . Heavy drinker 04/11/2014   Overview:  Patient reports drinking 2-3 bottles of wine each weekend.    . History of methicillin resistant  staphylococcus aureus (MRSA) 11/2015  . Infection of urinary tract 09/30/2013  . Irregular bleeding 09/30/2013  . Multiple allergies   . VSD (ventricular septal defect and aortic arch hypoplasia     Past Surgical History:  Procedure Laterality Date  . BOTOX INJECTION N/A 10/23/2016   Procedure: BOTOX INJECTION;  Surgeon: Royston Cowper, MD;  Location: [facility] ORS;  Service: Urology;  Laterality: N/A;  . BREAST BIOPSY Left 11/13/13   benign  . CESAREAN SECTION    . COLONOSCOPY    . COLONOSCOPY WITH PROPOFOL N/A 07/04/2020    Procedure: COLONOSCOPY WITH PROPOFOL;  Surgeon: Lesly Rubenstein, MD;  Location: [facility] ENDOSCOPY;  Service: Endoscopy;  Laterality: N/A;  . CORONARY ANGIOPLASTY    . CYSTOSCOPY N/A 10/23/2016   Procedure: CYSTOSCOPY;  Surgeon: Royston Cowper, MD;  Location: [facility] ORS;  Service: Urology;  Laterality: N/A;  . CYSTOSCOPY WITH HYDRODISTENSION AND BIOPSY    . LAPAROSCOPIC ASSISTED VAGINAL HYSTERECTOMY N/A 08/20/2016   Procedure: LAPAROSCOPIC ASSISTED VAGINAL HYSTERECTOMY;  Surgeon: Boykin Nearing, MD;  Location: [facility] ORS;  Service: Gynecology;  Laterality: N/A;  . LAPAROSCOPIC BILATERAL SALPINGECTOMY Bilateral 08/20/2016   Procedure: LAPAROSCOPIC BILATERAL SALPINGECTOMY;  Surgeon: Boykin Nearing, MD;  Location: [facility] ORS;  Service: Gynecology;  Laterality: Bilateral;  . NASAL SINUS SURGERY     x12    Family Psychiatric History: Reviewed family psychiatric history from my progress note on 12/12/2018  Family History:  Family History  Problem Relation Age of Onset  . Breast cancer Maternal Aunt   . Breast cancer Paternal Aunt   . Breast cancer Maternal Grandmother   . Breast cancer Paternal Grandmother   . Breast cancer Paternal Aunt   . Bipolar disorder Paternal Aunt   . Kidney cancer Maternal Uncle   . Prostate cancer Paternal Grandfather   . Kidney disease Other        father's side  . Aneurysm Mother   . Liver disease Mother   . Drug abuse Mother   . Alcohol abuse Mother   . Anxiety disorder Mother   . Depression Mother   . Cancer Father   . Anxiety disorder Father   . Depression Father     Social History: Reviewed social history from my progress note on 12/12/2018 Social History   Socioeconomic History  . Marital status: Married    Spouse name: greg   . Number of children: 2  . Years of education: Not on file  . Highest education level: Associate degree: occupational, Hotel manager, or vocational program  Occupational History  . Not on file  Tobacco Use  .  Smoking status: Former Smoker    Types: Cigarettes    Quit date: 11/12/2006    Years since quitting: 13.8  . Smokeless tobacco: Never Used  . Tobacco comment: quit 2008  Vaping Use  . Vaping Use: Never used  Substance and Sexual Activity  . Alcohol use: No    Alcohol/week: 0.0 standard drinks  . Drug use: No  . Sexual activity: Yes    Birth control/protection: None  Other Topics Concern  . Not on file  Social History Narrative  . Not on file   Social Determinants of Health   Financial Resource Strain:   . Difficulty of Paying Living Expenses: Not on file  Food Insecurity:   . Worried About Charity fundraiser in the Last Year: Not on file  . Ran Out of Food in the Last Year: Not on file  Transportation Needs:   .  Lack of Transportation (Medical): Not on file  . Lack of Transportation (Non-Medical): Not on file  Physical Activity:   . Days of Exercise per Week: Not on file  . Minutes of Exercise per Session: Not on file  Stress:   . Feeling of Stress : Not on file  Social Connections:   . Frequency of Communication with Friends and Family: Not on file  . Frequency of Social Gatherings with Friends and Family: Not on file  . Attends Religious Services: Not on file  . Active Member of Clubs or Organizations: Not on file  . Attends Archivist Meetings: Not on file  . Marital Status: Not on file    Allergies:  Allergies  Allergen Reactions  . Bee Pollen Anaphylaxis  . Bee Venom Anaphylaxis  . Eggs Or Egg-Derived Products Anaphylaxis  . Keflex [Cephalexin] Anaphylaxis  . Lac Bovis Anaphylaxis  . Lactase Anaphylaxis and Other (See Comments)  . Milk-Related Compounds Anaphylaxis  . Penicillins Anaphylaxis    Has patient had a PCN reaction causing immediate rash, facial/tongue/throat swelling, SOB or lightheadedness with hypotension: No Has patient had a PCN reaction causing severe rash involving mucus membranes or skin necrosis: No Has patient had a PCN  reaction that required hospitalization No Has patient had a PCN reaction occurring within the last 10 years: Yes If all of the above answers are "NO", then may proceed with Cephalosporin use.   . Shellfish Allergy Anaphylaxis and Other (See Comments)  . Soy Allergy Anaphylaxis  . Topamax [Topiramate]     Diarrhea   . Wheat Bran Anaphylaxis  . Butalbital-Apap-Caffeine Other (See Comments)    Rebound headache Rebound headache  . Codeine     Other reaction(s): RASH  . Ferrous Gluconate Diarrhea    And vomiting And vomiting  . Gabapentin Other (See Comments)  . Hydrocodone Hives  . Iron Diarrhea  . Meclizine     'Acts crazy', insomnia  . Red Dye Other (See Comments)  . Iodine Rash  . Latex Itching and Rash    Metabolic Disorder Labs: No results found for: HGBA1C, MPG No results found for: PROLACTIN No results found for: CHOL, TRIG, HDL, CHOLHDL, VLDL, LDLCALC Lab Results  Component Value Date   TSH 5.752 (H) 09/19/2017   TSH 1.190 09/04/2016    Therapeutic Level Labs: No results found for: LITHIUM No results found for: VALPROATE No components found for:  CBMZ  Current Medications: Current Outpatient Medications  Medication Sig Dispense Refill  . albuterol (PROVENTIL HFA;VENTOLIN HFA) 108 (90 Base) MCG/ACT inhaler Inhale 2 puffs into the lungs every 6 (six) hours as needed for wheezing or shortness of breath. 1 Inhaler 2  . ALPRAZolam (XANAX) 0.5 MG tablet Take 1 tablet (0.5 mg total) by mouth as directed. Take it only once a day as needed for severe panic symptoms 10 tablet 0  . ATROVENT HFA 17 MCG/ACT inhaler     . azelastine (ASTELIN) 0.1 % nasal spray     . azelastine (OPTIVAR) 0.05 % ophthalmic solution Place 1 drop into both eyes daily.    . Azelastine HCl 0.15 % SOLN Place 2 sprays into the nose 2 (two) times daily.     . Biotin 10000 MCG TABS Take 10,000 mg by mouth daily.    . Botulinum Toxin Type A (BOTOX) 200 units SOLR INJECT 200 UNITS  INTRAMUSCULARLY EVERY  3  MONTHS (GIVEN AT MD OFFICE, DISCARD UNUSED)    . budesonide (PULMICORT) 1 MG/2ML nebulizer solution Inhale  into the lungs.    . cetirizine (ZYRTEC) 10 MG tablet Take 2 tablets by mouth 2 (two) times daily.     . Cholecalciferol (VITAMIN D-1000 MAX ST) 1000 units tablet Take 1 tablet by mouth 2 (two) times daily.     Marland Kitchen CRANBERRY PO Take 1 capsule by mouth at bedtime.     . cyanocobalamin (,VITAMIN B-12,) 1000 MCG/ML injection INJECT ONE ML INTO THE MUSCLE ONCE WEEKLY FOR FOUR WEEKS. THEN INJECT 1ML INTO THE MUSCLE ONCE MONTHY FOR FOUR MONTHS    . desvenlafaxine (PRISTIQ) 100 MG 24 hr tablet Take 1 tablet (100 mg total) by mouth daily. 90 tablet 1  . EPINEPHrine 0.3 mg/0.3 mL IJ SOAJ injection Inject 0.3 mLs (0.3 mg total) into the muscle once. 1 Device 1  . fluticasone (FLONASE) 50 MCG/ACT nasal spray Place 1 spray into both nostrils 2 (two) times daily.     . fluticasone (FLOVENT HFA) 220 MCG/ACT inhaler INHALE 2 PUFFS BY MOUTH TWICE DAILY    . Fluticasone-Salmeterol (ADVAIR) 250-50 MCG/DOSE AEPB Inhale 1 puff into the lungs 2 (two) times daily.    . hydrochlorothiazide (HYDRODIURIL) 25 MG tablet Take 25 mg by mouth daily.    . hydrOXYzine (ATARAX/VISTARIL) 50 MG tablet hydroxyzine HCl 50 mg tablet    . ipratropium (ATROVENT HFA) 17 MCG/ACT inhaler Inhale into the lungs.    Marland Kitchen ipratropium (ATROVENT) 0.03 % nasal spray Place into both nostrils.    Marland Kitchen levocetirizine (XYZAL) 5 MG tablet Take 2 tablets by mouth twice daily    . levothyroxine (SYNTHROID, LEVOTHROID) 50 MCG tablet Take 50 mcg daily, any generic manufacturer is okay but try to stay with same manufacturer as much as possible    . magnesium oxide (MAG-OX) 400 MG tablet Take 400 mg by mouth daily.    . montelukast (SINGULAIR) 10 MG tablet Take 10 mg by mouth at bedtime.     . Multiple Vitamins-Minerals (ZINC PO) Take 1 tablet by mouth daily.    . nitrofurantoin, macrocrystal-monohydrate, (MACROBID) 100 MG capsule Take by mouth.    Marland Kitchen  omalizumab (XOLAIR) 150 MG injection INJECT 300MG SUBCUTANEOUSLY EVERY 4 WEEKS (GIVEN AT PRESCRIBERS OFFICE)    . Pediatric Multiple Vit-C-FA (CHEWABLE VITE CHILDRENS) CHEW Chew by mouth.    . potassium chloride (KLOR-CON) 10 MEQ tablet potassium chloride ER 10 mEq tablet,extended release    . vitamin B-12 (CYANOCOBALAMIN) 1000 MCG tablet Take 1,000 mcg by mouth daily.    . vitamin C (ASCORBIC ACID) 500 MG tablet Take 500 mg by mouth at bedtime.    . candesartan (ATACAND) 8 MG tablet Take by mouth.    . RELION PEN NEEDLES 29G X 12MM MISC USE 1 PEN NEEDLE ONCE DAILY AS DIRECTED (Patient not taking: Reported on 08/29/2020)    . Semaglutide,0.25 or 0.5MG/DOS, (OZEMPIC, 0.25 OR 0.5 MG/DOSE,) 2 MG/1.5ML SOPN Start 0.25 mg/week and increase after one month to 0.5 mg/week as directed by MD (Patient not taking: Reported on 08/29/2020)    . SUPREP BOWEL PREP KIT 17.5-3.13-1.6 GM/177ML SOLN Take by mouth as directed. (Patient not taking: Reported on 08/29/2020)     No current facility-administered medications for this visit.     Musculoskeletal: Strength & Muscle Tone: UTA Gait & Station: normal Patient leans: N/A  Psychiatric Specialty Exam: Review of Systems  Psychiatric/Behavioral: The patient is nervous/anxious.   All other systems reviewed and are negative.   Last menstrual period 07/17/2016.There is no height or weight on file to calculate BMI.  General Appearance: Casual  Eye Contact:  Fair  Speech:  Clear and Coherent  Volume:  Normal  Mood:  Anxious  Affect:  Congruent  Thought Process:  Goal Directed and Descriptions of Associations: Intact  Orientation:  Full (Time, Place, and Person)  Thought Content: Logical   Suicidal Thoughts:  No  Homicidal Thoughts:  No  Memory:  Immediate;   Fair Recent;   Fair Remote;   Fair  Judgement:  Fair  Insight:  Fair  Psychomotor Activity:  Normal  Concentration:  Concentration: Fair and Attention Span: Fair  Recall:  [employer] of  Knowledge: Fair  Language: Fair  Akathisia:  No  Handed:  Right  AIMS (if indicated): UTA  Assets:  Communication Skills Desire for Improvement Housing Social Support  ADL's:  Intact  Cognition: WNL  Sleep:  Fair   Screenings:   Assessment and Plan: DNYA HICKLE is a 49 year old female, has a history of anxiety, insomnia, needle phobia, multiple drug allergies was evaluated by telemedicine today.  Patient was last seen in February and was supposed to follow-up with her primary care provider.  She however returns reporting her primary care provider could not prescribe the medications.  She is currently back on Pristiq which was restarted recently however continues to struggle with anxiety.  Plan as noted below.  Plan GAD-unstable Pristiq 100 mg p.o. daily, recently restart.  Needle phobia-unstable-chronic Continue CBT as needed  Insomnia-stable She will monitor herself closely.  Continue to work on sleep hygiene.  Gene[employer] testing results were reviewed while making medication changes today.  Follow-up in clinic in 4 weeks or sooner if needed.  I have spent atleast 20 minutes  face to face by video with patient today. More than 50 % of the time was spent for preparing to see the patient ( e.g., review of test, records ),  ordering medications and test ,psychoeducation and supportive psychotherapy and care coordination,as well as documenting clinical information in electronic health record. This note was generated in part or whole with voice recognition software. Voice recognition is usually quite accurate but there are transcription errors that can and very often do occur. I apologize for any typographical errors that were not detected and corrected.        Kristine Alert, MD 08/29/2020, 5:16 PM

## 2020-10-17 ENCOUNTER — Telehealth (INDEPENDENT_AMBULATORY_CARE_PROVIDER_SITE_OTHER): Payer: 59 | Admitting: Psychiatry

## 2020-10-17 ENCOUNTER — Other Ambulatory Visit: Payer: Self-pay

## 2020-10-17 ENCOUNTER — Encounter: Payer: Self-pay | Admitting: Psychiatry

## 2020-10-17 DIAGNOSIS — F40298 Other specified phobia: Secondary | ICD-10-CM

## 2020-10-17 DIAGNOSIS — G4701 Insomnia due to medical condition: Secondary | ICD-10-CM

## 2020-10-17 DIAGNOSIS — F411 Generalized anxiety disorder: Secondary | ICD-10-CM

## 2020-10-17 MED ORDER — DESVENLAFAXINE SUCCINATE ER 100 MG PO TB24: 100.0000 mg | ORAL_TABLET | Freq: Every day | ORAL | 1 refills | Status: DC

## 2020-10-17 NOTE — Progress Notes (Signed)
Virtual Visit via Video Note  I connected with Kristine [name] on 10/17/20 at 10:40 AM EST by a video enabled telemedicine application and verified that I am speaking with the correct person using two identifiers.  Location Provider Location : [facility] Patient Location : Work  Participants: Patient , Provider    I discussed the limitations of evaluation and management by telemedicine and the availability of in person appointments. The patient expressed understanding and agreed to proceed.   I discussed the assessment and treatment plan with the patient. The patient was provided an opportunity to ask questions and all were answered. The patient agreed with the plan and demonstrated an understanding of the instructions.   The patient was advised to call back or seek an in-person evaluation if the symptoms worsen or if the condition fails to improve as anticipated.  [facility] MD OP Progress Note  10/17/2020 12:53 PM Kristine [name]  MRN:  [id number]  Chief Complaint:  Chief Complaint    Follow-up     HPI: Kristine [name] is a 49 year old female, married, employed, lives in [place], has a history of GAD, insomnia, needle phobia, PTSD, TSH abnormalities, GERD was evaluated by telemedicine today.  Patient today reports she is currently struggling with sinus infection.  She struggles with sinus pressure which does have an impact on her functioning during the day.  She is currently on an antibiotic.  She may need prednisone therapy again if this does not get better.  She however reports she is overall doing okay on the Pristiq.  She reports her anxiety symptoms are more under control.  She continues to struggle with sleep currently more so because of her sinus infection.  She is not interested in sleep medications.  She will work on Nutritional therapist.  Patient denies any suicidality, homicidality or perceptual disturbances.  Visit Diagnosis:    ICD-10-CM   1. GAD (generalized anxiety  disorder)  F41.1 desvenlafaxine (PRISTIQ) 100 MG 24 hr tablet  2. Insomnia due to medical condition  G47.01   3. Needle phobia  F40.298     Past Psychiatric History: I have reviewed past psychiatric history from my progress note on 12/12/2018  Past Medical History:  Past Medical History:  Diagnosis Date  . Abnormal weight gain 08/05/2013  . Absence of interventricular septum 08/05/2013  . Allergic rhinitis 04/17/2016  . Allergic urticaria due to ingested food 02/09/2016  . Anxiety   . Asthma   . Clinical depression 09/30/2013  . Complication of anesthesia    PT HAD TROUBLE BREATHING AFTER HYSTERECTOMY IN OCTOBER  . Depression   . Dizziness   . Excess weight 09/30/2013  . GERD (gastroesophageal reflux disease)   . Headache, migraine 09/30/2013  . Heart murmur   . Heavy drinker 04/11/2014   Overview:  Patient reports drinking 2-3 bottles of wine each weekend.    . History of methicillin resistant staphylococcus aureus (MRSA) 11/2015  . Infection of urinary tract 09/30/2013  . Irregular bleeding 09/30/2013  . Multiple allergies   . VSD (ventricular septal defect and aortic arch hypoplasia     Past Surgical History:  Procedure Laterality Date  . BOTOX INJECTION N/A 10/23/2016   Procedure: BOTOX INJECTION;  Surgeon: Royston Cowper, MD;  Location: [facility] ORS;  Service: Urology;  Laterality: N/A;  . BREAST BIOPSY Left 11/13/13   benign  . CESAREAN SECTION    . COLONOSCOPY    . COLONOSCOPY WITH PROPOFOL N/A 07/04/2020   Procedure: COLONOSCOPY WITH PROPOFOL;  Surgeon: Lesly Rubenstein, MD;  Location: [facility];  Service: Endoscopy;  Laterality: N/A;  . CORONARY ANGIOPLASTY    . CYSTOSCOPY N/A 10/23/2016   Procedure: CYSTOSCOPY;  Surgeon: Royston Cowper, MD;  Location: [facility] ORS;  Service: Urology;  Laterality: N/A;  . CYSTOSCOPY WITH HYDRODISTENSION AND BIOPSY    . LAPAROSCOPIC ASSISTED VAGINAL HYSTERECTOMY N/A 08/20/2016   Procedure: LAPAROSCOPIC ASSISTED VAGINAL HYSTERECTOMY;   Surgeon: Boykin Nearing, MD;  Location: [facility] ORS;  Service: Gynecology;  Laterality: N/A;  . LAPAROSCOPIC BILATERAL SALPINGECTOMY Bilateral 08/20/2016   Procedure: LAPAROSCOPIC BILATERAL SALPINGECTOMY;  Surgeon: Boykin Nearing, MD;  Location: [facility] ORS;  Service: Gynecology;  Laterality: Bilateral;  . NASAL SINUS SURGERY     x12    Family Psychiatric History: I have reviewed family psychiatric history from my progress note on 12/12/2018  Family History:  Family History  Problem Relation Age of Onset  . Breast cancer Maternal Aunt   . Breast cancer Paternal Aunt   . Breast cancer Maternal Grandmother   . Breast cancer Paternal Grandmother   . Breast cancer Paternal Aunt   . Bipolar disorder Paternal Aunt   . Kidney cancer Maternal Uncle   . Prostate cancer Paternal Grandfather   . Kidney disease Other        father's side  . Aneurysm Mother   . Liver disease Mother   . Drug abuse Mother   . Alcohol abuse Mother   . Anxiety disorder Mother   . Depression Mother   . Cancer Father   . Anxiety disorder Father   . Depression Father     Social History: Reviewed social history from my progress note on 12/12/2018 Social History   Socioeconomic History  . Marital status: Married    Spouse name: greg   . Number of children: 2  . Years of education: Not on file  . Highest education level: Associate degree: occupational, Hotel manager, or vocational program  Occupational History  . Not on file  Tobacco Use  . Smoking status: Former Smoker    Types: Cigarettes    Quit date: 11/12/2006    Years since quitting: 13.9  . Smokeless tobacco: Never Used  . Tobacco comment: quit 2008  Vaping Use  . Vaping Use: Never used  Substance and Sexual Activity  . Alcohol use: No    Alcohol/week: 0.0 standard drinks  . Drug use: No  . Sexual activity: Yes    Birth control/protection: None  Other Topics Concern  . Not on file  Social History Narrative  . Not on file   Social  Determinants of Health   Financial Resource Strain:   . Difficulty of Paying Living Expenses: Not on file  Food Insecurity:   . Worried About Charity fundraiser in the Last Year: Not on file  . Ran Out of Food in the Last Year: Not on file  Transportation Needs:   . Lack of Transportation (Medical): Not on file  . Lack of Transportation (Non-Medical): Not on file  Physical Activity:   . Days of Exercise per Week: Not on file  . Minutes of Exercise per Session: Not on file  Stress:   . Feeling of Stress : Not on file  Social Connections:   . Frequency of Communication with Friends and Family: Not on file  . Frequency of Social Gatherings with Friends and Family: Not on file  . Attends Religious Services: Not on file  . Active Member of Clubs or Organizations: Not  on file  . Attends Archivist Meetings: Not on file  . Marital Status: Not on file    Allergies:  Allergies  Allergen Reactions  . Bee Pollen Anaphylaxis  . Bee Venom Anaphylaxis  . Eggs Or Egg-Derived Products Anaphylaxis  . Keflex [Cephalexin] Anaphylaxis  . Lac Bovis Anaphylaxis  . Lactase Anaphylaxis and Other (See Comments)  . Milk-Related Compounds Anaphylaxis  . Penicillins Anaphylaxis    Has patient had a PCN reaction causing immediate rash, facial/tongue/throat swelling, SOB or lightheadedness with hypotension: No Has patient had a PCN reaction causing severe rash involving mucus membranes or skin necrosis: No Has patient had a PCN reaction that required hospitalization No Has patient had a PCN reaction occurring within the last 10 years: Yes If all of the above answers are "NO", then may proceed with Cephalosporin use.   . Shellfish Allergy Anaphylaxis and Other (See Comments)  . Soy Allergy Anaphylaxis  . Topamax [Topiramate]     Diarrhea   . Wheat Bran Anaphylaxis  . Butalbital-Apap-Caffeine Other (See Comments)    Rebound headache Rebound headache  . Codeine     Other reaction(s): RASH   . Ferrous Gluconate Diarrhea    And vomiting And vomiting  . Gabapentin Other (See Comments)  . Hydrocodone Hives  . Iron Diarrhea  . Meclizine     'Acts crazy', insomnia  . Red Dye Other (See Comments)  . Iodine Rash  . Latex Itching and Rash    Metabolic Disorder Labs: No results found for: HGBA1C, MPG No results found for: PROLACTIN No results found for: CHOL, TRIG, HDL, CHOLHDL, VLDL, LDLCALC Lab Results  Component Value Date   TSH 5.752 (H) 09/19/2017   TSH 1.190 09/04/2016    Therapeutic Level Labs: No results found for: LITHIUM No results found for: VALPROATE No components found for:  CBMZ  Current Medications: Current Outpatient Medications  Medication Sig Dispense Refill  . moxifloxacin (AVELOX) 400 MG tablet Take by mouth.    . ondansetron (ZOFRAN-ODT) 4 MG disintegrating tablet Take by mouth.    . predniSONE (DELTASONE) 10 MG tablet 60 mg on day 1, 50 mg on day 2, 40 mg on day 3, 30 mg on day 4, 20 mg on day 5, 10 mg on day 6 then stop    . albuterol (PROVENTIL HFA;VENTOLIN HFA) 108 (90 Base) MCG/ACT inhaler Inhale 2 puffs into the lungs every 6 (six) hours as needed for wheezing or shortness of breath. 1 Inhaler 2  . ALPRAZolam (XANAX) 0.5 MG tablet Take 1 tablet (0.5 mg total) by mouth as directed. Take it only once a day as needed for severe panic symptoms 10 tablet 0  . ATROVENT HFA 17 MCG/ACT inhaler     . azelastine (ASTELIN) 0.1 % nasal spray     . azelastine (OPTIVAR) 0.05 % ophthalmic solution Place 1 drop into both eyes daily.    . Azelastine HCl 0.15 % SOLN Place 2 sprays into the nose 2 (two) times daily.     . Biotin 10000 MCG TABS Take 10,000 mg by mouth daily.    . Botulinum Toxin Type A (BOTOX) 200 units SOLR INJECT 200 UNITS  INTRAMUSCULARLY EVERY 3  MONTHS (GIVEN AT MD OFFICE, DISCARD UNUSED)    . budesonide (PULMICORT) 1 MG/2ML nebulizer solution Inhale into the lungs.    . candesartan (ATACAND) 8 MG tablet Take by mouth.    . cetirizine  (ZYRTEC) 10 MG tablet Take 2 tablets by mouth 2 (two) times  daily.     . Cholecalciferol (VITAMIN D-1000 MAX ST) 1000 units tablet Take 1 tablet by mouth 2 (two) times daily.     Marland Kitchen CRANBERRY PO Take 1 capsule by mouth at bedtime.     . cyanocobalamin (,VITAMIN B-12,) 1000 MCG/ML injection INJECT ONE ML INTO THE MUSCLE ONCE WEEKLY FOR FOUR WEEKS. THEN INJECT 1ML INTO THE MUSCLE ONCE MONTHY FOR FOUR MONTHS    . desvenlafaxine (PRISTIQ) 100 MG 24 hr tablet Take 1 tablet (100 mg total) by mouth daily. 90 tablet 1  . EPINEPHrine 0.3 mg/0.3 mL IJ SOAJ injection Inject 0.3 mLs (0.3 mg total) into the muscle once. 1 Device 1  . fluticasone (FLONASE) 50 MCG/ACT nasal spray Place 1 spray into both nostrils 2 (two) times daily.     . fluticasone (FLOVENT HFA) 220 MCG/ACT inhaler INHALE 2 PUFFS BY MOUTH TWICE DAILY    . Fluticasone-Salmeterol (ADVAIR) 250-50 MCG/DOSE AEPB Inhale 1 puff into the lungs 2 (two) times daily.    . hydrochlorothiazide (HYDRODIURIL) 25 MG tablet Take 25 mg by mouth daily.    . hydrOXYzine (ATARAX/VISTARIL) 50 MG tablet hydroxyzine HCl 50 mg tablet    . ipratropium (ATROVENT HFA) 17 MCG/ACT inhaler Inhale into the lungs.    Marland Kitchen ipratropium (ATROVENT) 0.03 % nasal spray Place into both nostrils.    Marland Kitchen levocetirizine (XYZAL) 5 MG tablet Take 2 tablets by mouth twice daily    . levothyroxine (SYNTHROID, LEVOTHROID) 50 MCG tablet Take 50 mcg daily, any generic manufacturer is okay but try to stay with same manufacturer as much as possible    . magnesium oxide (MAG-OX) 400 MG tablet Take 400 mg by mouth daily.    . montelukast (SINGULAIR) 10 MG tablet Take 10 mg by mouth at bedtime.     . moxifloxacin (AVELOX) 400 MG tablet Take 400 mg by mouth daily.    . Multiple Vitamins-Minerals (ZINC PO) Take 1 tablet by mouth daily.    . nitrofurantoin, macrocrystal-monohydrate, (MACROBID) 100 MG capsule Take by mouth.    . nystatin (MYCOSTATIN) 100000 UNIT/ML suspension Take by mouth.    Marland Kitchen  omalizumab (XOLAIR) 150 MG injection INJECT 300MG SUBCUTANEOUSLY EVERY 4 WEEKS (GIVEN AT PRESCRIBERS OFFICE)    . Pediatric Multiple Vit-C-FA (CHEWABLE VITE CHILDRENS) CHEW Chew by mouth.    . potassium chloride (KLOR-CON) 10 MEQ tablet potassium chloride ER 10 mEq tablet,extended release    . RELION PEN NEEDLES 29G X 12MM MISC USE 1 PEN NEEDLE ONCE DAILY AS DIRECTED (Patient not taking: Reported on 08/29/2020)    . Semaglutide,0.25 or 0.5MG/DOS, (OZEMPIC, 0.25 OR 0.5 MG/DOSE,) 2 MG/1.5ML SOPN Start 0.25 mg/week and increase after one month to 0.5 mg/week as directed by MD (Patient not taking: Reported on 08/29/2020)    . SUPREP BOWEL PREP KIT 17.5-3.13-1.6 GM/177ML SOLN Take by mouth as directed. (Patient not taking: Reported on 08/29/2020)    . vitamin B-12 (CYANOCOBALAMIN) 1000 MCG tablet Take 1,000 mcg by mouth daily.    . vitamin C (ASCORBIC ACID) 500 MG tablet Take 500 mg by mouth at bedtime.     No current facility-administered medications for this visit.     Musculoskeletal: Strength & Muscle Tone: UTA Gait & Station: UTA Patient leans: N/A  Psychiatric Specialty Exam: Review of Systems  HENT: Positive for congestion, sinus pressure and sinus pain.   Psychiatric/Behavioral: Positive for sleep disturbance. Negative for agitation, behavioral problems, confusion, decreased concentration, dysphoric mood, hallucinations and self-injury. The patient is not nervous/anxious and is  not hyperactive.   All other systems reviewed and are negative.   Last menstrual period 07/17/2016.There is no height or weight on file to calculate BMI.  General Appearance: Casual  Eye Contact:  Fair  Speech:  Clear and Coherent  Volume:  Normal  Mood:  Euthymic  Affect:  Congruent  Thought Process:  Goal Directed and Descriptions of Associations: Intact  Orientation:  Full (Time, Place, and Person)  Thought Content: Logical   Suicidal Thoughts:  No  Homicidal Thoughts:  No  Memory:  Immediate;    Fair Recent;   Fair Remote;   Fair  Judgement:  Fair  Insight:  Fair  Psychomotor Activity:  Normal  Concentration:  Concentration: Fair and Attention Span: Fair  Recall:  [employer] of Knowledge: Fair  Language: Fair  Akathisia:  No  Handed:  Right  AIMS (if indicated): UTA  Assets:  Communication Skills Desire for Improvement Social Support  ADL's:  Intact  Cognition: WNL  Sleep:  Restless   Screenings:   Assessment and Plan: CORIANA ANGELLO is a 49 year old female who has a history of anxiety, insomnia, needle phobia, multiple drug allergies was evaluated by telemedicine today.  Patient is currently struggling with sinusitis however is doing well on the Pristiq.  Plan as noted below.  Plan GAD-stable Pristiq 100 mg p.o. daily.  Needle phobia-chronic Continue CBT as needed  Insomnia-restless Continue sleep hygiene techniques, sleep problems dominantly due to her sinusitis.  [facility] testing results were reviewed by making medication changes today.  Patient will continue to follow-up with her primary care provider for her sinusitis.  Follow-up in clinic in 3 months or sooner if needed.  I have spent atleast 20 minutes face to face by video with patient today. More than 50 % of the time was spent for preparing to see the patient ( e.g., review of test, records ), ordering medications and test ,psychoeducation and supportive psychotherapy and care coordination,as well as documenting clinical information in electronic health record. This note was generated in part or whole with voice recognition software. Voice recognition is usually quite accurate but there are transcription errors that can and very often do occur. I apologize for any typographical errors that were not detected and corrected.        Ursula Alert, MD 10/18/2020, 8:27 AM

## 2020-11-17 ENCOUNTER — Encounter: Payer: Self-pay | Admitting: Dermatology

## 2021-01-16 ENCOUNTER — Telehealth: Payer: Self-pay

## 2021-01-16 DIAGNOSIS — F411 Generalized anxiety disorder: Secondary | ICD-10-CM

## 2021-01-16 MED ORDER — DESVENLAFAXINE SUCCINATE ER 100 MG PO TB24: 100.0000 mg | ORAL_TABLET | Freq: Every day | ORAL | 0 refills | Status: AC

## 2021-01-16 NOTE — Telephone Encounter (Signed)
Any idea why she was dismissed ?

## 2021-01-16 NOTE — Telephone Encounter (Signed)
pt has stated on her call that she was going somewhere else but she will not have enought to get to her next appt with other provider.

## 2021-01-16 NOTE — Telephone Encounter (Signed)
I have sent Pristiq to [facility].

## 2021-01-16 NOTE — Telephone Encounter (Signed)
pt has been dismissed and you did give her enough medication to last until she can find someone. she has an appt on march 26th but the problem is that her insurance changed in january and that they have to use [facility] and [facility] will not transfer rx pristiq 100mg 

## 2021-02-03 ENCOUNTER — Other Ambulatory Visit: Payer: Self-pay | Admitting: Internal Medicine

## 2021-02-03 DIAGNOSIS — J329 Chronic sinusitis, unspecified: Secondary | ICD-10-CM | POA: Insufficient documentation

## 2021-02-03 DIAGNOSIS — Z1231 Encounter for screening mammogram for malignant neoplasm of breast: Secondary | ICD-10-CM

## 2021-02-14 ENCOUNTER — Telehealth: Payer: Self-pay

## 2021-02-14 NOTE — Telephone Encounter (Signed)
I did not get the message routed .Please clarify .

## 2021-02-16 ENCOUNTER — Telehealth: Payer: Self-pay

## 2021-02-16 NOTE — Telephone Encounter (Signed)
Please ask her to contact [facility] or speak to her health insurance to give a list of places that offer that.

## 2021-02-16 NOTE — Telephone Encounter (Signed)
pt called states that her primary will not give her the mediation wanted to send her to dr. Nicolasa Ducking. but dr Nicolasa Ducking. but dr. Nicolasa Ducking doesnt take her insurance. her primary care told her that it a nasal stray that will help with anxiety and depression and wants to know if you prescribe the nasal spray.

## 2021-02-21 NOTE — Telephone Encounter (Signed)
Message has been left twice and tried to call again and no message could be left

## 2021-02-27 ENCOUNTER — Other Ambulatory Visit: Payer: Self-pay

## 2021-02-27 ENCOUNTER — Ambulatory Visit
Admission: RE | Admit: 2021-02-27 | Discharge: 2021-02-27 | Disposition: A | Discharge status: Home / Self Care | Payer: BC Managed Care – PPO | Source: Ambulatory Visit | Attending: Internal Medicine | Admitting: Internal Medicine

## 2021-02-27 DIAGNOSIS — Z1231 Encounter for screening mammogram for malignant neoplasm of breast: Secondary | ICD-10-CM

## 2021-02-27 HISTORY — DX: Malignant (primary) neoplasm, unspecified: C80.1

## 2021-03-06 ENCOUNTER — Telehealth: Payer: Self-pay

## 2021-03-06 NOTE — Telephone Encounter (Signed)
pt called she wanted to speak with you about a medication she states she wanted to see if you would give her something different.

## 2021-03-06 NOTE — Telephone Encounter (Signed)
She needs to schedule appointment for medications . Thank you .

## 2021-03-15 ENCOUNTER — Other Ambulatory Visit: Payer: Self-pay

## 2021-03-15 ENCOUNTER — Ambulatory Visit (INDEPENDENT_AMBULATORY_CARE_PROVIDER_SITE_OTHER): Payer: BC Managed Care – PPO | Admitting: Plastic Surgery

## 2021-03-15 DIAGNOSIS — N62 Hypertrophy of breast: Secondary | ICD-10-CM

## 2021-03-15 DIAGNOSIS — M546 Pain in thoracic spine: Secondary | ICD-10-CM

## 2021-03-15 DIAGNOSIS — M4004 Postural kyphosis, thoracic region: Secondary | ICD-10-CM

## 2021-03-15 DIAGNOSIS — M545 Low back pain, unspecified: Secondary | ICD-10-CM

## 2021-03-15 DIAGNOSIS — M793 Panniculitis, unspecified: Secondary | ICD-10-CM | POA: Diagnosis not present

## 2021-03-15 NOTE — Progress Notes (Signed)
Referring Provider Marinda Elk, [place] [place],  [place] [address]   CC:  Chief Complaint  Patient presents with  . Advice Only      Kristine [name] is an 50 y.o. female.  HPI: Patient presents to discuss issues with her breast and abdomen.  She would like to discuss surgical treatment and breast reduction.  She currently has back pain, neck pain and shoulder grooving related to her large breasts.  She is tried over-the-counter medications, warm packs, cold packs and supportive bras with little relief.  She also gets rashes beneath her breasts are refractory to over-the-counter treatments.  She currently triple D and wants to be around a D cup.  She does have maternal and paternal grandparents with a history of breast cancer.  She herself has had a needle biopsy in 2015 in the left breast that was normal.  She does not smoke and is not a diabetic.  She has been to physical therapy in the past and is up-to-date on her mammograms.  Regarding her abdomen she is bothered by the abdominal contour.  She has lost some weight but her weight has been fairly stable for the past 6 months.  She does get rashes beneath her abdomen crease that have been refractory to over-the-counter prescription treatments.  Prior abdominal operations include 2 C-sections and hysterectomy.  Allergies  Allergen Reactions  . Bee Pollen Anaphylaxis  . Bee Venom Anaphylaxis  . Eggs Or Egg-Derived Products Anaphylaxis  . Keflex [Cephalexin] Anaphylaxis  . Lac Bovis Anaphylaxis  . Lactase Anaphylaxis and Other (See Comments)  . Milk-Related Compounds Anaphylaxis  . Penicillins Anaphylaxis    Has patient had a PCN reaction causing immediate rash, facial/tongue/throat swelling, SOB or lightheadedness with hypotension: No Has patient had a PCN reaction causing severe rash involving mucus membranes or skin necrosis: No Has patient had a PCN reaction that required hospitalization  No Has patient had a PCN reaction occurring within the last 10 years: Yes If all of the above answers are "NO", then may proceed with Cephalosporin use.   . Shellfish Allergy Anaphylaxis and Other (See Comments)  . Soy Allergy Anaphylaxis  . Topamax [Topiramate]     Diarrhea   . Wheat Bran Anaphylaxis  . Butalbital-Apap-Caffeine Other (See Comments)    Rebound headache Rebound headache  . Codeine     Other reaction(s): RASH  . Ferrous Gluconate Diarrhea    And vomiting And vomiting  . Gabapentin Other (See Comments)  . Hydrocodone Hives  . Iron Diarrhea  . Meclizine     'Acts crazy', insomnia  . Red Dye Other (See Comments)  . Iodine Rash  . Latex Itching and Rash    Outpatient Encounter Medications as of 03/15/2021  Medication Sig Note  . albuterol (PROVENTIL HFA;VENTOLIN HFA) 108 (90 Base) MCG/ACT inhaler Inhale 2 puffs into the lungs every 6 (six) hours as needed for wheezing or shortness of breath.   . ALPRAZolam (XANAX) 0.5 MG tablet Take 1 tablet (0.5 mg total) by mouth as directed. Take it only once a day as needed for severe panic symptoms   . ATROVENT HFA 17 MCG/ACT inhaler    . azelastine (ASTELIN) 0.1 % nasal spray    . azelastine (OPTIVAR) 0.05 % ophthalmic solution Place 1 drop into both eyes daily.   . Azelastine HCl 0.15 % SOLN Place 2 sprays into the nose 2 (two) times daily.    . Biotin 10000 MCG TABS  Take 10,000 mg by mouth daily.   . Botulinum Toxin Type A (BOTOX) 200 units SOLR INJECT 200 UNITS  INTRAMUSCULARLY EVERY 3  MONTHS (GIVEN AT MD OFFICE, DISCARD UNUSED)   . budesonide (PULMICORT) 1 MG/2ML nebulizer solution Inhale into the lungs.   . candesartan (ATACAND) 8 MG tablet Take by mouth.   . cetirizine (ZYRTEC) 10 MG tablet Take 2 tablets by mouth 2 (two) times daily.    . Cholecalciferol (VITAMIN D-1000 MAX ST) 1000 units tablet Take 1 tablet by mouth 2 (two) times daily.    Marland Kitchen CRANBERRY PO Take 1 capsule by mouth at bedtime.    . cyanocobalamin  (,VITAMIN B-12,) 1000 MCG/ML injection INJECT ONE ML INTO THE MUSCLE ONCE WEEKLY FOR FOUR WEEKS. THEN INJECT 1ML INTO THE MUSCLE ONCE MONTHY FOR FOUR MONTHS   . desvenlafaxine (PRISTIQ) 100 MG 24 hr tablet Take 1 tablet (100 mg total) by mouth daily.   Marland Kitchen EPINEPHrine 0.3 mg/0.3 mL IJ SOAJ injection Inject 0.3 mLs (0.3 mg total) into the muscle once.   . fluticasone (FLONASE) 50 MCG/ACT nasal spray Place 1 spray into both nostrils 2 (two) times daily.    . fluticasone (FLOVENT HFA) 220 MCG/ACT inhaler INHALE 2 PUFFS BY MOUTH TWICE DAILY   . Fluticasone-Salmeterol (ADVAIR) 250-50 MCG/DOSE AEPB Inhale 1 puff into the lungs 2 (two) times daily.   . hydrochlorothiazide (HYDRODIURIL) 25 MG tablet Take 25 mg by mouth daily.   . hydrOXYzine (ATARAX/VISTARIL) 50 MG tablet hydroxyzine HCl 50 mg tablet   . ipratropium (ATROVENT HFA) 17 MCG/ACT inhaler Inhale into the lungs.   Marland Kitchen ipratropium (ATROVENT) 0.03 % nasal spray Place into both nostrils.   Marland Kitchen levocetirizine (XYZAL) 5 MG tablet Take 2 tablets by mouth twice daily   . levothyroxine (SYNTHROID, LEVOTHROID) 50 MCG tablet Take 50 mcg daily, any generic manufacturer is okay but try to stay with same manufacturer as much as possible 09/28/2019: Takes 100 mcg every other day 50 mcg every other day  . magnesium oxide (MAG-OX) 400 MG tablet Take 400 mg by mouth daily.   . montelukast (SINGULAIR) 10 MG tablet Take 10 mg by mouth at bedtime.    . moxifloxacin (AVELOX) 400 MG tablet Take 400 mg by mouth daily.   . Multiple Vitamins-Minerals (ZINC PO) Take 1 tablet by mouth daily.   . nitrofurantoin, macrocrystal-monohydrate, (MACROBID) 100 MG capsule Take by mouth.   . nystatin (MYCOSTATIN) 100000 UNIT/ML suspension Take by mouth.   Marland Kitchen omalizumab (XOLAIR) 150 MG injection INJECT 300MG SUBCUTANEOUSLY EVERY 4 WEEKS (GIVEN AT PRESCRIBERS OFFICE)   . ondansetron (ZOFRAN-ODT) 4 MG disintegrating tablet Take by mouth.   . Pediatric Multiple Vit-C-FA (CHEWABLE VITE  CHILDRENS) CHEW Chew by mouth.   . potassium chloride (KLOR-CON) 10 MEQ tablet potassium chloride ER 10 mEq tablet,extended release   . predniSONE (DELTASONE) 10 MG tablet 60 mg on day 1, 50 mg on day 2, 40 mg on day 3, 30 mg on day 4, 20 mg on day 5, 10 mg on day 6 then stop   . RELION PEN NEEDLES 29G X 12MM MISC USE 1 PEN NEEDLE ONCE DAILY AS DIRECTED (Patient not taking: Reported on 08/29/2020)   . Semaglutide,0.25 or 0.5MG/DOS, (OZEMPIC, 0.25 OR 0.5 MG/DOSE,) 2 MG/1.5ML SOPN Start 0.25 mg/week and increase after one month to 0.5 mg/week as directed by MD (Patient not taking: Reported on 08/29/2020)   . SUPREP BOWEL PREP KIT 17.5-3.13-1.6 GM/177ML SOLN Take by mouth as directed. (Patient not taking: Reported  on 08/29/2020)   . vitamin B-12 (CYANOCOBALAMIN) 1000 MCG tablet Take 1,000 mcg by mouth daily.   . vitamin C (ASCORBIC ACID) 500 MG tablet Take 500 mg by mouth at bedtime.    No facility-administered encounter medications on file as of 03/15/2021.     Past Medical History:  Diagnosis Date  . Abnormal weight gain 08/05/2013  . Absence of interventricular septum 08/05/2013  . Allergic rhinitis 04/17/2016  . Allergic urticaria due to ingested food 02/09/2016  . Anxiety   . Asthma   . Cancer ([place])    melanoma-left eye  . Clinical depression 09/30/2013  . Complication of anesthesia    PT HAD TROUBLE BREATHING AFTER HYSTERECTOMY IN OCTOBER  . Depression   . Dizziness   . Excess weight 09/30/2013  . GERD (gastroesophageal reflux disease)   . Headache, migraine 09/30/2013  . Heart murmur   . Heavy drinker 04/11/2014   Overview:  Patient reports drinking 2-3 bottles of wine each weekend.    . History of methicillin resistant staphylococcus aureus (MRSA) 11/2015  . Infection of urinary tract 09/30/2013  . Irregular bleeding 09/30/2013  . Multiple allergies   . VSD (ventricular septal defect and aortic arch hypoplasia     Past Surgical History:  Procedure Laterality Date  . ABDOMINAL  HYSTERECTOMY    . BOTOX INJECTION N/A 10/23/2016   Procedure: BOTOX INJECTION;  Surgeon: Royston Cowper, MD;  Location: [facility] ORS;  Service: Urology;  Laterality: N/A;  . BREAST BIOPSY Left 11/13/13   benign  . CESAREAN SECTION    . COLONOSCOPY    . COLONOSCOPY WITH PROPOFOL N/A 07/04/2020   Procedure: COLONOSCOPY WITH PROPOFOL;  Surgeon: Lesly Rubenstein, MD;  Location[facility];  Service: Endoscopy;  Laterality: N/A;  . CORONARY ANGIOPLASTY    . CYSTOSCOPY N/A 10/23/2016   Procedure: CYSTOSCOPY;  Surgeon: Royston Cowper, MD;  Location: [facility] ORS;  Service: Urology;  Laterality: N/A;  . CYSTOSCOPY WITH HYDRODISTENSION AND BIOPSY    . LAPAROSCOPIC ASSISTED VAGINAL HYSTERECTOMY N/A 08/20/2016   Procedure: LAPAROSCOPIC ASSISTED VAGINAL HYSTERECTOMY;  Surgeon: Boykin Nearing, MD;  Location: [facility] ORS;  Service: Gynecology;  Laterality: N/A;  . LAPAROSCOPIC BILATERAL SALPINGECTOMY Bilateral 08/20/2016   Procedure: LAPAROSCOPIC BILATERAL SALPINGECTOMY;  Surgeon: Boykin Nearing, MD;  Location: [facility] ORS;  Service: Gynecology;  Laterality: Bilateral;  . NASAL SINUS SURGERY     x12    Family History  Problem Relation Age of Onset  . Breast cancer Maternal Aunt   . Breast cancer Paternal Aunt   . Breast cancer Maternal Grandmother   . Breast cancer Paternal Grandmother   . Breast cancer Paternal Aunt   . Bipolar disorder Paternal Aunt   . Kidney cancer Maternal Uncle   . Prostate cancer Paternal Grandfather   . Kidney disease Other        father's side  . Aneurysm Mother   . Liver disease Mother   . Drug abuse Mother   . Alcohol abuse Mother   . Anxiety disorder Mother   . Depression Mother   . Cancer Father   . Anxiety disorder Father   . Depression Father     Social History   Social History Narrative  . Not on file  Denies tobacco use  Review of Systems General: Denies fevers, chills, weight loss CV: Denies chest pain, shortness of breath,  palpitations  Physical Exam Vitals with BMI 07/04/2020 07/04/2020 09/10/2019  Height - '5\' 2"'  -  Weight - 166  lbs -  BMI - 52.77 -  Systolic 824 235 361  Diastolic 76 443 67  Pulse - 81 85  Some encounter information is confidential and restricted. Go to Review Flowsheets activity to see all data.    General:  No acute distress,  Alert and oriented, Non-Toxic, Normal speech and affect Breast: She has grade 3 ptosis.  Sternal notch to nipple is 33 cm bilaterally nipple to fold is 12 cm bilaterally.  I do not see any obvious scars or masses Abdomen: Abdomen is soft nontender.  I do not appreciate any hernias.  She has a lower transverse scar from her C-sections.  She has excess infra and supraumbilical skin.  There is an overhanging pannus with signs of chronic skin irritation of the crease.  She does have some component of intra-abdominal fat that contributes to her contour as well.  I expect she does have some rectus diastases.  Assessment/Plan The patient has bilateral symptomatic macromastia.  She is a good candidate for a breast reduction.  She is interested in pursuing surgical treatment.  She has tried supportive garments and fitted bras with no relief.  The details of breast reduction surgery were discussed.  I explained the procedure in detail along the with the expected scars.  The risks were discussed in detail and include bleeding, infection, damage to surrounding structures, need for additional procedures, nipple loss, change in nipple sensation, persistent pain, contour irregularities and asymmetries.  I explained that breast feeding is often not possible after breast reduction surgery.  We discussed the expected postoperative course with an overall recovery period of about 1 month.  She demonstrated full understanding of all risks.  We discussed her personal risk factors that include anesthesia risk due to heart conditions.  The patient is interested in pursuing surgical treatment.  I  anticipate approximately 450g of tissue removed from each side.  Regarding her abdomen I do think she is a good candidate for panniculectomy.  I think she would also benefit from liposuction of the upper abdomen with abdominal wall plication.  Given her symptoms and requirements for topical treatments for rashes I do think that she warrants treatment.  We discussed the risks of the procedure that include bleeding, infection, damage to surrounding structures and need for additional procedures.  We discussed the risk for potential potential contour irregularities.  I discussed the location and orientation of the scar.  I just discussed the expected postoperative recovery process which would be about 8 weeks of avoiding strenuous activity.  She is fully understanding and wants to move forward.   Cindra Presume 03/15/2021, 4:55 PM

## 2021-03-20 ENCOUNTER — Encounter: Payer: Self-pay | Admitting: Psychiatry

## 2021-03-20 ENCOUNTER — Other Ambulatory Visit: Payer: Self-pay

## 2021-03-20 ENCOUNTER — Telehealth (INDEPENDENT_AMBULATORY_CARE_PROVIDER_SITE_OTHER): Payer: BC Managed Care – PPO | Admitting: Psychiatry

## 2021-03-20 DIAGNOSIS — F411 Generalized anxiety disorder: Secondary | ICD-10-CM

## 2021-03-20 DIAGNOSIS — F40298 Other specified phobia: Secondary | ICD-10-CM | POA: Diagnosis not present

## 2021-03-20 DIAGNOSIS — G4701 Insomnia due to medical condition: Secondary | ICD-10-CM | POA: Diagnosis not present

## 2021-03-20 NOTE — Progress Notes (Signed)
Virtual Visit via Video Note  I connected with Kristine [name] on 03/20/21 at  2:40 PM EDT by a video enabled telemedicine application and verified that I am speaking with the correct person using two identifiers.  Location Provider Location : [facility] Patient Location : Work  Participants: Patient , Provider    I discussed the limitations of evaluation and management by telemedicine and the availability of in person appointments. The patient expressed understanding and agreed to proceed.    I discussed the assessment and treatment plan with the patient. The patient was provided an opportunity to ask questions and all were answered. The patient agreed with the plan and demonstrated an understanding of the instructions.   The patient was advised to call back or seek an in-person evaluation if the symptoms worsen or if the condition fails to improve as anticipated.   [facility] MD OP Progress Note  03/20/2021 3:25 PM Kristine [name]  MRN:  [id number]  Chief Complaint:  Chief Complaint    Follow-up; Anxiety     HPI: Kristine [name] is a 50 year old female, married, employed, lives in [place], has a history of GAD, insomnia, needle phobia, PTSD, TSH abnormalities, GERD was evaluated by telemedicine today.  Patient was last seen on 10/17/2020 for a follow-up appointment.  Patient today returns reporting she was referred to the office again by her primary care provider for a ketamine infusion.  Patient reports she was told by her primary care provider that this office offers it.  Patient denies any depressive symptoms.  She continues to have anxiety and reports she continues to feel nervous, anxious, restless and worries about different things all the time.  She continues to be on the Pristiq however she reports it does not help much.  She does not want to try any other medications and has failed multiple other medications in the past or develops side effects to it.  She is sensitive to  medications in general and had an allergic reactions to multiple medications. Genesight testing did not help since patient developed side effects or allergic reactions in spite of doing the same.  Patient denies any suicidality, homicidality or perceptual disturbances.  She reports work is going well.  She does report her relationship struggles with her daughter.  She reports she is unable to find time for psychotherapy sessions.  Patient denies any other concerns today.  Visit Diagnosis:    ICD-10-CM   1. GAD (generalized anxiety disorder)  F41.1   2. Insomnia due to medical condition  G47.01   3. Needle phobia  F40.298     Past Psychiatric History: I have reviewed past psychiatric history from progress note on 12/12/2018  Past Medical History:  Past Medical History:  Diagnosis Date  . Abnormal weight gain 08/05/2013  . Absence of interventricular septum 08/05/2013  . Allergic rhinitis 04/17/2016  . Allergic urticaria due to ingested food 02/09/2016  . Anxiety   . Asthma   . Cancer ([place])    melanoma-left eye  . Clinical depression 09/30/2013  . Complication of anesthesia    PT HAD TROUBLE BREATHING AFTER HYSTERECTOMY IN OCTOBER  . Depression   . Dizziness   . Excess weight 09/30/2013  . GERD (gastroesophageal reflux disease)   . Headache, migraine 09/30/2013  . Heart murmur   . Heavy drinker 04/11/2014   Overview:  Patient reports drinking 2-3 bottles of wine each weekend.    . History of methicillin resistant staphylococcus aureus (MRSA) 11/2015  . Infection of  urinary tract 09/30/2013  . Irregular bleeding 09/30/2013  . Multiple allergies   . VSD (ventricular septal defect and aortic arch hypoplasia     Past Surgical History:  Procedure Laterality Date  . ABDOMINAL HYSTERECTOMY    . BOTOX INJECTION N/A 10/23/2016   Procedure: BOTOX INJECTION;  Surgeon: Royston Cowper, MD;  Location: [facility] ORS;  Service: Urology;  Laterality: N/A;  . BREAST BIOPSY Left 11/13/13    benign  . CESAREAN SECTION    . COLONOSCOPY    . COLONOSCOPY WITH PROPOFOL N/A 07/04/2020   Procedure: COLONOSCOPY WITH PROPOFOL;  Surgeon: Lesly Rubenstein, MD;  Location: [facility] ENDOSCOPY;  Service: Endoscopy;  Laterality: N/A;  . CORONARY ANGIOPLASTY    . CYSTOSCOPY N/A 10/23/2016   Procedure: CYSTOSCOPY;  Surgeon: Royston Cowper, MD;  Location: [facility] ORS;  Service: Urology;  Laterality: N/A;  . CYSTOSCOPY WITH HYDRODISTENSION AND BIOPSY    . LAPAROSCOPIC ASSISTED VAGINAL HYSTERECTOMY N/A 08/20/2016   Procedure: LAPAROSCOPIC ASSISTED VAGINAL HYSTERECTOMY;  Surgeon: Boykin Nearing, MD;  Location: [facility] ORS;  Service: Gynecology;  Laterality: N/A;  . LAPAROSCOPIC BILATERAL SALPINGECTOMY Bilateral 08/20/2016   Procedure: LAPAROSCOPIC BILATERAL SALPINGECTOMY;  Surgeon: Boykin Nearing, MD;  Location: [facility] ORS;  Service: Gynecology;  Laterality: Bilateral;  . NASAL SINUS SURGERY     x12    Family Psychiatric History: I have reviewed family psychiatric history from my progress note on 12/12/2018  Family History:  Family History  Problem Relation Age of Onset  . Breast cancer Maternal Aunt   . Breast cancer Paternal Aunt   . Breast cancer Maternal Grandmother   . Breast cancer Paternal Grandmother   . Breast cancer Paternal Aunt   . Bipolar disorder Paternal Aunt   . Kidney cancer Maternal Uncle   . Prostate cancer Paternal Grandfather   . Kidney disease Other        father's side  . Aneurysm Mother   . Liver disease Mother   . Drug abuse Mother   . Alcohol abuse Mother   . Anxiety disorder Mother   . Depression Mother   . Cancer Father   . Anxiety disorder Father   . Depression Father     Social History: Reviewed social history from my progress note on 12/12/2018 Social History   Socioeconomic History  . Marital status: Married    Spouse name: greg   . Number of children: 2  . Years of education: Not on file  . Highest education level: Associate degree:  occupational, Hotel manager, or vocational program  Occupational History  . Not on file  Tobacco Use  . Smoking status: Former Smoker    Types: Cigarettes    Quit date: 11/12/2006    Years since quitting: 14.3  . Smokeless tobacco: Never Used  . Tobacco comment: quit 2008  Vaping Use  . Vaping Use: Never used  Substance and Sexual Activity  . Alcohol use: No    Alcohol/week: 0.0 standard drinks  . Drug use: No  . Sexual activity: Yes    Birth control/protection: None  Other Topics Concern  . Not on file  Social History Narrative  . Not on file   Social Determinants of Health   Financial Resource Strain: Not on file  Food Insecurity: Not on file  Transportation Needs: Not on file  Physical Activity: Not on file  Stress: Not on file  Social Connections: Not on file    Allergies:  Allergies  Allergen Reactions  . Bee Pollen Anaphylaxis  .  Bee Venom Anaphylaxis  . Eggs Or Egg-Derived Products Anaphylaxis  . Keflex [Cephalexin] Anaphylaxis  . Lac Bovis Anaphylaxis  . Lactase Anaphylaxis and Other (See Comments)  . Milk-Related Compounds Anaphylaxis  . Penicillins Anaphylaxis    Has patient had a PCN reaction causing immediate rash, facial/tongue/throat swelling, SOB or lightheadedness with hypotension: No Has patient had a PCN reaction causing severe rash involving mucus membranes or skin necrosis: No Has patient had a PCN reaction that required hospitalization No Has patient had a PCN reaction occurring within the last 10 years: Yes If all of the above answers are "NO", then may proceed with Cephalosporin use.   . Shellfish Allergy Anaphylaxis and Other (See Comments)  . Soy Allergy Anaphylaxis  . Topamax [Topiramate]     Diarrhea   . Wheat Bran Anaphylaxis  . Butalbital-Apap-Caffeine Other (See Comments)    Rebound headache Rebound headache  . Codeine     Other reaction(s): RASH  . Ferrous Gluconate Diarrhea    And vomiting And vomiting  . Gabapentin Other (See  Comments)  . Hydrocodone Hives  . Iron Diarrhea  . Meclizine     'Acts crazy', insomnia  . Red Dye Other (See Comments)  . Iodine Rash  . Latex Itching and Rash    Metabolic Disorder Labs: No results found for: HGBA1C, MPG No results found for: PROLACTIN No results found for: CHOL, TRIG, HDL, CHOLHDL, VLDL, LDLCALC Lab Results  Component Value Date   TSH 5.752 (H) 09/19/2017   TSH 1.190 09/04/2016    Therapeutic Level Labs: No results found for: LITHIUM No results found for: VALPROATE No components found for:  CBMZ  Current Medications: Current Outpatient Medications  Medication Sig Dispense Refill  . Fremanezumab-vfrm (AJOVY) 225 MG/1.5ML SOAJ     . albuterol (PROVENTIL HFA;VENTOLIN HFA) 108 (90 Base) MCG/ACT inhaler Inhale 2 puffs into the lungs every 6 (six) hours as needed for wheezing or shortness of breath. 1 Inhaler 2  . ALPRAZolam (XANAX) 0.5 MG tablet Take 1 tablet (0.5 mg total) by mouth as directed. Take it only once a day as needed for severe panic symptoms 10 tablet 0  . ATROVENT HFA 17 MCG/ACT inhaler     . azelastine (ASTELIN) 0.1 % nasal spray     . azelastine (OPTIVAR) 0.05 % ophthalmic solution Place 1 drop into both eyes daily.    . Azelastine HCl 0.15 % SOLN Place 2 sprays into the nose 2 (two) times daily.     . Biotin 10000 MCG TABS Take 10,000 mg by mouth daily.    . Botulinum Toxin Type A (BOTOX) 200 units SOLR INJECT 200 UNITS  INTRAMUSCULARLY EVERY 3  MONTHS (GIVEN AT MD OFFICE, DISCARD UNUSED)    . budesonide (PULMICORT) 1 MG/2ML nebulizer solution Inhale into the lungs.    . candesartan (ATACAND) 8 MG tablet Take by mouth.    . cetirizine (ZYRTEC) 10 MG tablet Take 2 tablets by mouth 2 (two) times daily.     . Cholecalciferol (VITAMIN D-1000 MAX ST) 1000 units tablet Take 1 tablet by mouth 2 (two) times daily.     Marland Kitchen CRANBERRY PO Take 1 capsule by mouth at bedtime.     . cyanocobalamin (,VITAMIN B-12,) 1000 MCG/ML injection INJECT ONE ML INTO THE  MUSCLE ONCE WEEKLY FOR FOUR WEEKS. THEN INJECT 1ML INTO THE MUSCLE ONCE MONTHY FOR FOUR MONTHS    . desvenlafaxine (PRISTIQ) 100 MG 24 hr tablet Take 1 tablet (100 mg total) by mouth daily. [place]  tablet 0  . EPINEPHrine 0.3 mg/0.3 mL IJ SOAJ injection Inject 0.3 mLs (0.3 mg total) into the muscle once. 1 Device 1  . fluticasone (FLONASE) 50 MCG/ACT nasal spray Place 1 spray into both nostrils 2 (two) times daily.     . fluticasone (FLOVENT HFA) 220 MCG/ACT inhaler INHALE 2 PUFFS BY MOUTH TWICE DAILY    . Fluticasone-Salmeterol (ADVAIR) 250-50 MCG/DOSE AEPB Inhale 1 puff into the lungs 2 (two) times daily.    . hydrochlorothiazide (HYDRODIURIL) 25 MG tablet Take 25 mg by mouth daily.    . hydrOXYzine (ATARAX/VISTARIL) 50 MG tablet hydroxyzine HCl 50 mg tablet    . ipratropium (ATROVENT HFA) 17 MCG/ACT inhaler Inhale into the lungs.    Marland Kitchen ipratropium (ATROVENT) 0.03 % nasal spray Place into both nostrils.    Marland Kitchen levocetirizine (XYZAL) 5 MG tablet Take 2 tablets by mouth twice daily    . levothyroxine (SYNTHROID, LEVOTHROID) 50 MCG tablet Take 50 mcg daily, any generic manufacturer is okay but try to stay with same manufacturer as much as possible    . magnesium oxide (MAG-OX) 400 MG tablet Take 400 mg by mouth daily.    . mometasone (ELOCON) 0.1 % ointment Apply topically.    . montelukast (SINGULAIR) 10 MG tablet Take 10 mg by mouth at bedtime.     . moxifloxacin (AVELOX) 400 MG tablet Take 400 mg by mouth daily.    . Multiple Vitamins-Minerals (ZINC PO) Take 1 tablet by mouth daily.    . nitrofurantoin, macrocrystal-monohydrate, (MACROBID) 100 MG capsule Take by mouth.    . nystatin (MYCOSTATIN) 100000 UNIT/ML suspension Take by mouth.    Marland Kitchen omalizumab (XOLAIR) 150 MG injection INJECT 300MG SUBCUTANEOUSLY EVERY 4 WEEKS (GIVEN AT PRESCRIBERS OFFICE)    . ondansetron (ZOFRAN-ODT) 4 MG disintegrating tablet Take by mouth.    . Pediatric Multiple Vit-C-FA (CHEWABLE VITE CHILDRENS) CHEW Chew by mouth.    .  potassium chloride (KLOR-CON) 10 MEQ tablet potassium chloride ER 10 mEq tablet,extended release    . predniSONE (DELTASONE) 10 MG tablet 60 mg on day 1, 50 mg on day 2, 40 mg on day 3, 30 mg on day 4, 20 mg on day 5, 10 mg on day 6 then stop    . RELION PEN NEEDLES 29G X 12MM MISC USE 1 PEN NEEDLE ONCE DAILY AS DIRECTED (Patient not taking: Reported on 08/29/2020)    . Semaglutide,0.25 or 0.5MG/DOS, (OZEMPIC, 0.25 OR 0.5 MG/DOSE,) 2 MG/1.5ML SOPN Start 0.25 mg/week and increase after one month to 0.5 mg/week as directed by MD (Patient not taking: Reported on 08/29/2020)    . SUPREP BOWEL PREP KIT 17.5-3.13-1.6 GM/177ML SOLN Take by mouth as directed. (Patient not taking: Reported on 08/29/2020)    . triamcinolone cream (KENALOG) 0.1 % Apply topically.    . vitamin B-12 (CYANOCOBALAMIN) 1000 MCG tablet Take 1,000 mcg by mouth daily.    . vitamin C (ASCORBIC ACID) 500 MG tablet Take 500 mg by mouth at bedtime.     No current facility-administered medications for this visit.     Musculoskeletal: Strength & Muscle Tone: UTA Gait & Station: UTA Patient leans: N/A  Psychiatric Specialty Exam: Review of Systems  Psychiatric/Behavioral: The patient is nervous/anxious.   All other systems reviewed and are negative.   Last menstrual period 07/17/2016.There is no height or weight on file to calculate BMI.  General Appearance: Casual  Eye Contact:  Fair  Speech:  Clear and Coherent  Volume:  Normal  Mood:  Anxious  Affect:  Congruent  Thought Process:  Goal Directed and Descriptions of Associations: Intact  Orientation:  Full (Time, Place, and Person)  Thought Content: Logical   Suicidal Thoughts:  No  Homicidal Thoughts:  No  Memory:  Immediate;   Fair Recent;   Fair Remote;   Fair  Judgement:  Fair  Insight:  Fair  Psychomotor Activity:  Normal  Concentration:  Concentration: Fair and Attention Span: Fair  Recall:  [employer] of Knowledge: Fair  Language: Fair  Akathisia:  No   Handed:  Right  AIMS (if indicated): UTA  Assets:  Communication Skills Desire for Improvement Housing Social Support  ADL's:  Intact  Cognition: WNL  Sleep:  Fair   Screenings: GAD-7   Flowsheet Row Video Visit from 03/20/2021 in [facility]  Total GAD-7 Score 17    PHQ2-9   Flowsheet Row Video Visit from 03/20/2021 in [facility]  PHQ-2 Total Score 0    Flowsheet Row Video Visit from 03/20/2021 in [facility] No Risk       Assessment and Plan: Kristine [name] is a 50 year old female who has a history of anxiety, insomnia, needle phobia, multiple drug allergies was evaluated by telemedicine today.  Patient returns reporting she wants ketamine and that she was referred for the same.  Discussed plan as noted below.  Plan GAD-unstable GAD-7 today equals 17 Pristiq 100 mg p.o. daily Will refer for psychotherapy sessions- provided information for [facility], [facility].  Needle phobia-chronic Continue CBT as needed  Insomnia- stable Continue sleep hygiene techniques  Provided information for ketamine treatment in the community, provided information for [facility] as well as [facility].  This note was generated in part or whole with voice recognition software. Voice recognition is usually quite accurate but there are transcription errors that can and very often do occur. I apologize for any typographical errors that were not detected and corrected.        Ursula Alert, MD 03/21/2021, 8:37 AM

## 2021-03-27 ENCOUNTER — Other Ambulatory Visit: Payer: Self-pay

## 2021-03-27 ENCOUNTER — Ambulatory Visit (INDEPENDENT_AMBULATORY_CARE_PROVIDER_SITE_OTHER): Payer: BC Managed Care – PPO | Admitting: Dermatology

## 2021-03-27 DIAGNOSIS — L03811 Cellulitis of head [any part, except face]: Secondary | ICD-10-CM

## 2021-03-27 DIAGNOSIS — L409 Psoriasis, unspecified: Secondary | ICD-10-CM | POA: Diagnosis not present

## 2021-03-27 DIAGNOSIS — Z8614 Personal history of Methicillin resistant Staphylococcus aureus infection: Secondary | ICD-10-CM | POA: Diagnosis not present

## 2021-03-27 MED ORDER — MUPIROCIN 2 % EX OINT: 1.0000 "application " | TOPICAL_OINTMENT | CUTANEOUS | 0 refills | Status: DC

## 2021-03-27 MED ORDER — DOXYCYCLINE MONOHYDRATE 100 MG PO CAPS: 100.0000 mg | ORAL_CAPSULE | Freq: Two times a day (BID) | ORAL | 0 refills | Status: AC

## 2021-03-27 NOTE — Patient Instructions (Signed)
Psoriasis is a chronic non-curable, but treatable genetic/hereditary disease that may have other systemic features affecting other organ systems such as joints (Psoriatic Arthritis). It is associated with an increased risk of inflammatory bowel disease, heart disease, non-alcoholic fatty liver disease, and depression.    Doxycycline should be taken with food to prevent nausea. Do not lay down for 30 minutes after taking. Be cautious with sun exposure and use good sun protection while on this medication. Pregnant women should not take this medication.   Recommend daily broad spectrum sunscreen SPF 30+ to sun-exposed areas, reapply every 2 hours as needed. Call for new or changing lesions.  Staying in the shade or wearing long sleeves, sun glasses (UVA+UVB protection) and wide brim hats (4-inch brim around the entire circumference of the hat) are also recommended for sun protection.   If you have any questions or concerns for your doctor, please call our main line at [phone] and press option 4 to reach your doctor's medical assistant. If no one answers, please leave a voicemail as directed and we will return your call as soon as possible. Messages left after 4 pm will be answered the following business day.   You may also send [place] a message via [place]. We typically respond to MyChart messages within 1-2 business days.  For prescription refills, please ask your pharmacy to contact our office. Our fax number is [phone].  If you have an urgent issue when the clinic is closed that cannot wait until the next business day, you can page your doctor at the number below.    Please note that while we do our best to be available for urgent issues outside of office hours, we are not available 24/7.   If you have an urgent issue and are unable to reach [place], you may choose to seek medical care at your doctor's office, retail clinic, urgent care center, or emergency room.  If you have a medical emergency,  please immediately call 911 or go to the emergency department.  Pager Numbers  - Dr. Nehemiah Massed: [phone]  - Dr. Laurence Ferrari: [phone]  - Dr. Nicole Kindred: [phone]  In the event of inclement weather, please call our main line at [phone] for an update on the status of any delays or closures.  Dermatology Medication Tips: Please keep the boxes that topical medications come in in order to help keep track of the instructions about where and how to use these. Pharmacies typically print the medication instructions only on the boxes and not directly on the medication tubes.   If your medication is too expensive, please contact our office at [phone] option 4 or send [place] a message through [place].   We are unable to tell what your co-pay for medications will be in advance as this is different depending on your insurance coverage. However, we may be able to find a substitute medication at lower cost or fill out paperwork to get insurance to cover a needed medication.   If a prior authorization is required to get your medication covered by your insurance company, please allow [place] 1-2 business days to complete this process.  Drug prices often vary depending on where the prescription is filled and some pharmacies may offer cheaper prices.  The website www.goodrx.com contains coupons for medications through different pharmacies. The prices here do not account for what the cost may be with help from insurance (it may be cheaper with your insurance), but the website can give you the price if you did not  use any insurance.  - You can print the associated coupon and take it with your prescription to the pharmacy.  - You may also stop by our office during regular business hours and pick up a GoodRx coupon card.  - If you need your prescription sent electronically to a different pharmacy, notify our office through [place] MyChart or by phone at [phone] option 4.  

## 2021-03-27 NOTE — Progress Notes (Signed)
   New Patient Visit  Subjective  Kristine [name] is a 50 y.o. female who presents for the following: Rash and New Patient (Initial Visit) (Patient here today as a new patient. She is here concerning a rash on hands, at bra line, abdomen, back of head near hairline, arms. Patient states rash started back in November. She has seen another dermatologist who diagnosed as contact dermatitis. Patient was given triamcinolone 0.1 and clobetasol ointment 0.05% and still having trouble. She is concerned it may be psoriasis. Patient states creams help a little but still has rash. ).  Patient reports family history of psoriasis. She also reports personal history of autoimmune disorder.   Objective  Well appearing patient in no apparent distress; mood and affect are within normal limits.  A focused examination was performed including scalp, bilateral arms, right axilla, bilateral hands, abdomen, neck. Relevant physical exam findings are noted in the Assessment and Plan.  Objective  Scalp, elbows, hands, right side: Redness and scale   Assessment & Plan  Psoriasis Scalp, elbows, hands, right side Chronic condition with duration or expected duration over one year. Condition is bothersome to patient. Currently flared. Psoriasis is a chronic non-curable, but treatable genetic/hereditary disease that may have other systemic features affecting other organ systems such as joints (Psoriatic Arthritis). It is associated with an increased risk of inflammatory bowel disease, heart disease, non-alcoholic fatty liver disease, and depression.    Patient has family history of psoriasis   Start [place] Cream  0.005 % apply to affected areas twice daily for psoriasis  3 samples given today  SDAS-1 01/2022  Calcipotriene-Betameth Diprop [facility]) 0.005-0.064 % CREAM - sent in to [place]    Discussed alternative treatment such as [place] [facility]  in future if patient is not doing well with samples. Pamphlet given in  office today.  Cellulitis of scalp History of MRSA with tenderness at scalp today Start Doxycycline 100 mg by mouth bid with food for 1 week.   Doxycycline should be taken with food to prevent nausea. Do not lay down for 30 minutes after taking. Be cautious with sun exposure and use good sun protection while on this medication. Pregnant women should not take this medication.   Start Mupirocin ointment 2% apply to any open sores for 1 - 3 times daily until healed.   doxycycline (MONODOX) 100 MG capsule - Scalp, elbows, hands, right side  mupirocin ointment (BACTROBAN) 2 % - Scalp, elbows, hands, right side  Return in about 4 weeks (around 04/24/2021) for psoriasis follow up.  IRuthell Rummage, CMA, am acting as scribe for Sarina Ser, MD.  Documentation: I have reviewed the above documentation for accuracy and completeness, and I agree with the above.  Sarina Ser, MD

## 2021-03-28 ENCOUNTER — Other Ambulatory Visit: Payer: Self-pay

## 2021-03-28 MED ORDER — WYNZORA 0.005-0.064 % EX CREA: 1.0000 "application " | TOPICAL_CREAM | Freq: Two times a day (BID) | CUTANEOUS | 1 refills | Status: DC

## 2021-03-28 NOTE — Progress Notes (Signed)
Kristine [name] was not sent in at office visit yesterday. RX sent in to [facility]. Patient advised of pharmacy change.

## 2021-04-04 ENCOUNTER — Encounter: Payer: Self-pay | Admitting: Dermatology

## 2021-04-17 ENCOUNTER — Encounter: Payer: Self-pay | Admitting: Surgical

## 2021-04-17 NOTE — Progress Notes (Signed)
Surgical Clearance has been received from Dr. Tressia Miners for patient's upcoming surgery b/l breast reduction, panniculectomy with upper abdomen lipo and plication with Dr. Claudia Desanctis .  Pt is medically cleared.

## 2021-05-01 ENCOUNTER — Other Ambulatory Visit: Payer: Self-pay | Admitting: Dermatology

## 2021-05-01 ENCOUNTER — Ambulatory Visit (INDEPENDENT_AMBULATORY_CARE_PROVIDER_SITE_OTHER): Payer: BC Managed Care – PPO | Admitting: Dermatology

## 2021-05-01 ENCOUNTER — Other Ambulatory Visit: Payer: Self-pay

## 2021-05-01 DIAGNOSIS — L03811 Cellulitis of head [any part, except face]: Secondary | ICD-10-CM

## 2021-05-01 DIAGNOSIS — L409 Psoriasis, unspecified: Secondary | ICD-10-CM | POA: Diagnosis not present

## 2021-05-01 DIAGNOSIS — L299 Pruritus, unspecified: Secondary | ICD-10-CM | POA: Diagnosis not present

## 2021-05-01 DIAGNOSIS — L820 Inflamed seborrheic keratosis: Secondary | ICD-10-CM

## 2021-05-01 NOTE — Patient Instructions (Signed)

## 2021-05-01 NOTE — Progress Notes (Signed)
   Follow-Up Visit   Subjective  Kristine [name] is a 50 y.o. female who presents for the following: Psoriasis (Patient needs refills of Wynzora and states that Mometasone 0.1% cream is not helpful and she continues to itch. She is interested in starting [place]) and hx of cellulitis in the scalp  (Patient has a few scabs of the scalp, but has otherwise resolved with Doxycycline.). Patient has a lesion on her R forearm that is irregular, and she is concerned about possible skin cancer.   The following portions of the chart were reviewed this encounter and updated as appropriate:   Tobacco  Allergies  Meds  Problems  Med Hx  Surg Hx  Fam Hx      Review of Systems:  No other skin or systemic complaints except as noted in HPI or Assessment and Plan.  Objective  Well appearing patient in no apparent distress; mood and affect are within normal limits.  A focused examination was performed including extremities, scalp, and trunk. Relevant physical exam findings are noted in the Assessment and Plan.   Assessment & Plan  Cellulitis of scalp Scalp  - Resolved after treatment with Doxycycline discussed risk of recurrence.  Psoriasis elbows, knees, scalp, ears With pruritis - not clearing with topical agents (Wynzora, Mometasone)  BSA - 7%  Start [place]  Psoriasis is a chronic non-curable, but treatable genetic/hereditary disease that may have other systemic features affecting other organ systems such as joints (Psoriatic Arthritis). It is associated with an increased risk of inflammatory bowel disease, heart disease, non-alcoholic fatty liver disease, and depression.    Sample pack given - titrate up to 30mg  po QD until follow up appointment. Side effects of Otezla (apremilast) include diarrhea, nausea, headache, upper respiratory infection, depression, and weight decrease (5-10%). It should only be taken by pregnant women after a discussion regarding risks and benefits  with their doctor. Goal is control of skin condition, not cure.  The use of [place] requires long term medication management, including periodic office visits.  Continue Wynzora to aa's BID PRN.  Related Medications mupirocin ointment (BACTROBAN) 2 % Apply 1 application topically See admin instructions. Apply 1 to 3 times daily to any open sores  Inflamed seborrheic keratosis R forearm x 3  Destruction of lesion - R forearm x 3 Complexity: simple   Destruction method: cryotherapy   Informed consent: discussed and consent obtained   Timeout:  patient name, date of birth, surgical site, and procedure verified Lesion destroyed using liquid nitrogen: Yes   Region frozen until ice ball extended beyond lesion: Yes   Outcome: patient tolerated procedure well with no complications   Post-procedure details: wound care instructions given    Return in about 1 month (around 05/31/2021) for psoriasis/Otezla .  Luther Redo, CMA, am acting as scribe for Sarina Ser, MD .  Documentation: I have reviewed the above documentation for accuracy and completeness, and I agree with the above.  Sarina Ser, MD

## 2021-05-04 ENCOUNTER — Encounter: Payer: Self-pay | Admitting: Dermatology

## 2021-05-12 HISTORY — PX: COMBINED REDUCTION MAMMAPLASTY W/ ABDOMINOPLASTY: SUR306

## 2021-05-16 ENCOUNTER — Ambulatory Visit (INDEPENDENT_AMBULATORY_CARE_PROVIDER_SITE_OTHER): Payer: BC Managed Care – PPO | Admitting: Surgical

## 2021-05-16 ENCOUNTER — Encounter: Payer: Self-pay | Admitting: Surgical

## 2021-05-16 ENCOUNTER — Other Ambulatory Visit: Payer: Self-pay

## 2021-05-16 VITALS — BP 120/76 | HR 82 | Ht 62.0 in | Wt 163.0 lb

## 2021-05-16 DIAGNOSIS — M545 Low back pain, unspecified: Secondary | ICD-10-CM

## 2021-05-16 DIAGNOSIS — M793 Panniculitis, unspecified: Secondary | ICD-10-CM

## 2021-05-16 DIAGNOSIS — Z719 Counseling, unspecified: Secondary | ICD-10-CM

## 2021-05-16 DIAGNOSIS — M4004 Postural kyphosis, thoracic region: Secondary | ICD-10-CM

## 2021-05-16 DIAGNOSIS — N62 Hypertrophy of breast: Secondary | ICD-10-CM

## 2021-05-16 DIAGNOSIS — M546 Pain in thoracic spine: Secondary | ICD-10-CM

## 2021-05-16 MED ORDER — HYDROCODONE-ACETAMINOPHEN 5-325 MG PO TABS: 1.0000 | ORAL_TABLET | Freq: Four times a day (QID) | ORAL | 0 refills | Status: AC | PRN

## 2021-05-16 MED ORDER — ONDANSETRON HCL 4 MG PO TABS: 4.0000 mg | ORAL_TABLET | Freq: Three times a day (TID) | ORAL | 0 refills | Status: DC | PRN

## 2021-05-16 NOTE — Progress Notes (Signed)
Patient ID: Kristine [name], female    DOB: 1971/07/23, 50 y.o.   MRN: [id number]  Chief Complaint  Patient presents with   Pre-op Exam       ICD-10-CM   1. Macromastia  N62     2. Panniculitis  M79.3     3. Back pain of thoracolumbar region  M54.50    M54.6     4. Postural kyphosis, thoracic region  M40.04        History of Present Illness: Kristine [name] is a 50 y.o.  female  with a history of macromastia and panniculitis.  She presents for preoperative evaluation for upcoming procedure, bilateral breast reduction and Infraumbilical panniculectomy with upper abdominal liposuction and plication, scheduled for 06/02/2021 with Dr. Claudia Desanctis.  The patient has not had problems with anesthesia. No history of DVT/PE.  No family history of DVT/PE.  No family or personal history of bleeding or clotting disorders.  Patient is not currently taking any blood thinners.  No history of CVA/MI.   Summary of Previous Visit: She is currently a triple D and wants to be around a D cup.  She has a history of a needle biopsy in 2015 in the left breast that was normal.  She does not smoke and she is not a diabetic.  She is up-to-date on her mammograms.  Anticipate 450 g of tissue removed from each side.  In regards to her abdomen she is bothered by the abdominal contour.  She has a history of 2 C-sections and a hysterectomy.  Job: Hairdresser  PMH Significant for: Generalized anxiety, psoriasis, asthma, GERD, history of MRSA. Patient is currently on [place] for psoriasis.  Reports she recently started this.  She also reports that she uses steroid creams frequently for psoriasis and sometimes has psoriasis within the breast folds.  She does not currently have any active psoriasis within her breast folds. She reports that her asthma has been well controlled and has no recent hospitalizations or issues.  She has been feeling well lately, no recent fevers, chills, nausea, vomiting, chest pain, shortness  of breath.  Past Medical History: Allergies: Allergies  Allergen Reactions   Bee Pollen Anaphylaxis   Bee Venom Anaphylaxis   Eggs Or Egg-Derived Products Anaphylaxis   Keflex [Cephalexin] Anaphylaxis   Lac Bovis Anaphylaxis   Lactase Anaphylaxis and Other (See Comments)   Milk-Related Compounds Anaphylaxis   Penicillins Anaphylaxis    Has patient had a PCN reaction causing immediate rash, facial/tongue/throat swelling, SOB or lightheadedness with hypotension: No Has patient had a PCN reaction causing severe rash involving mucus membranes or skin necrosis: No Has patient had a PCN reaction that required hospitalization No Has patient had a PCN reaction occurring within the last 10 years: Yes If all of the above answers are "NO", then may proceed with Cephalosporin use.    Shellfish Allergy Anaphylaxis and Other (See Comments)   Soy Allergy Anaphylaxis   Topamax [Topiramate]     Diarrhea    Wheat Bran Anaphylaxis   Butalbital-Apap-Caffeine Other (See Comments)    Rebound headache Rebound headache   Codeine     Other reaction(s): RASH   Ferrous Gluconate Diarrhea    And vomiting And vomiting   Gabapentin Other (See Comments)   Hydrocodone Hives   Iron Diarrhea   Meclizine     'Acts crazy', insomnia   Red Dye Other (See Comments)   Iodine Rash   Latex Itching and Rash    Current  Medications:  Current Outpatient Medications:    albuterol (PROVENTIL HFA;VENTOLIN HFA) 108 (90 Base) MCG/ACT inhaler, Inhale 2 puffs into the lungs every 6 (six) hours as needed for wheezing or shortness of breath., Disp: 1 Inhaler, Rfl: 2   ALPRAZolam (XANAX) 0.5 MG tablet, Take 1 tablet (0.5 mg total) by mouth as directed. Take it only once a day as needed for severe panic symptoms, Disp: 10 tablet, Rfl: 0   Apremilast (OTEZLA) 30 MG TABS, Take 1 mg by mouth daily., Disp: , Rfl:    ATROVENT HFA 17 MCG/ACT inhaler, , Disp: , Rfl:    azelastine (ASTELIN) 0.1 % nasal spray, , Disp: , Rfl:     azelastine (OPTIVAR) 0.05 % ophthalmic solution, Place 1 drop into both eyes daily., Disp: , Rfl:    Azelastine HCl 0.15 % SOLN, Place 2 sprays into the nose 2 (two) times daily. , Disp: , Rfl:    Biotin 10000 MCG TABS, Take 10,000 mg by mouth daily., Disp: , Rfl:    Botulinum Toxin Type A (BOTOX) 200 units SOLR, INJECT 200 UNITS  INTRAMUSCULARLY EVERY 3  MONTHS (GIVEN AT MD OFFICE, DISCARD UNUSED), Disp: , Rfl:    cetirizine (ZYRTEC) 10 MG tablet, Take 2 tablets by mouth 2 (two) times daily. , Disp: , Rfl:    CRANBERRY PO, Take 1 capsule by mouth at bedtime. , Disp: , Rfl:    cyanocobalamin (,VITAMIN B-12,) 1000 MCG/ML injection, INJECT ONE ML INTO THE MUSCLE ONCE WEEKLY FOR FOUR WEEKS. THEN INJECT 1ML INTO THE MUSCLE ONCE MONTHY FOR FOUR MONTHS, Disp: , Rfl:    desvenlafaxine (PRISTIQ) 100 MG 24 hr tablet, Take 1 tablet (100 mg total) by mouth daily., Disp: 90 tablet, Rfl: 0   EPINEPHrine 0.3 mg/0.3 mL IJ SOAJ injection, Inject 0.3 mLs (0.3 mg total) into the muscle once., Disp: 1 Device, Rfl: 1   fluticasone (FLONASE) 50 MCG/ACT nasal spray, Place 1 spray into both nostrils 2 (two) times daily. , Disp: , Rfl:    fluticasone (FLOVENT HFA) 220 MCG/ACT inhaler, INHALE 2 PUFFS BY MOUTH TWICE DAILY, Disp: , Rfl:    Fluticasone-Salmeterol (ADVAIR) 250-50 MCG/DOSE AEPB, Inhale 1 puff into the lungs 2 (two) times daily., Disp: , Rfl:    Fremanezumab-vfrm (AJOVY) 225 MG/1.5ML SOAJ, , Disp: , Rfl:    hydrochlorothiazide (HYDRODIURIL) 25 MG tablet, Take 25 mg by mouth daily., Disp: , Rfl:    ipratropium (ATROVENT) 0.03 % nasal spray, Place into both nostrils., Disp: , Rfl:    levocetirizine (XYZAL) 5 MG tablet, Take 2 tablets by mouth twice daily, Disp: , Rfl:    levothyroxine (SYNTHROID, LEVOTHROID) 50 MCG tablet, Take 50 mcg daily, any generic manufacturer is okay but try to stay with same manufacturer as much as possible, Disp: , Rfl:    magnesium oxide (MAG-OX) 400 MG tablet, Take 400 mg by mouth  daily., Disp: , Rfl:    mometasone (ELOCON) 0.1 % ointment, Apply topically., Disp: , Rfl:    montelukast (SINGULAIR) 10 MG tablet, Take 10 mg by mouth at bedtime. , Disp: , Rfl:    Multiple Vitamins-Minerals (ZINC PO), Take 1 tablet by mouth daily., Disp: , Rfl:    mupirocin ointment (BACTROBAN) 2 %, Apply 1 application topically See admin instructions. Apply 1 to 3 times daily to any open sores, Disp: 22 g, Rfl: 0   nitrofurantoin, macrocrystal-monohydrate, (MACROBID) 100 MG capsule, Take by mouth., Disp: , Rfl:    omalizumab (XOLAIR) 150 MG injection, INJECT 300MG  SUBCUTANEOUSLY  EVERY 4 WEEKS (GIVEN AT PRESCRIBERS OFFICE), Disp: , Rfl:    Pediatric Multiple Vit-C-FA (CHEWABLE VITE CHILDRENS) CHEW, Chew by mouth., Disp: , Rfl:    potassium chloride (KLOR-CON) 10 MEQ tablet, potassium chloride ER 10 mEq tablet,extended release, Disp: , Rfl:    RELION PEN NEEDLES 29G X 12MM MISC, USE 1 PEN NEEDLE ONCE DAILY AS DIRECTED, Disp: , Rfl:    Semaglutide,0.25 or 0.5MG /DOS, (OZEMPIC, 0.25 OR 0.5 MG/DOSE,) 2 MG/1.5ML SOPN, Start 0.25 mg/week and increase after one month to 0.5 mg/week as directed by MD, Disp: , Rfl:    triamcinolone cream (KENALOG) 0.1 %, Apply topically., Disp: , Rfl:    vitamin B-12 (CYANOCOBALAMIN) 1000 MCG tablet, Take 1,000 mcg by mouth daily., Disp: , Rfl:    vitamin C (ASCORBIC ACID) 500 MG tablet, Take 500 mg by mouth at bedtime., Disp: , Rfl:    WYNZORA 0.005-0.064 % CREA, APPLY ONE APPLICATION TOPICALLY TWO TIMES DAILY., Disp: 60 g, Rfl: 1   budesonide (PULMICORT) 1 MG/2ML nebulizer solution, Inhale into the lungs., Disp: , Rfl:    ipratropium (ATROVENT HFA) 17 MCG/ACT inhaler, Inhale into the lungs., Disp: , Rfl:   Past Medical Problems: Past Medical History:  Diagnosis Date   Abnormal weight gain 08/05/2013   Absence of interventricular septum 08/05/2013   Allergic rhinitis 04/17/2016   Allergic urticaria due to ingested food 02/09/2016   Anxiety    Asthma    Cancer ([place])     melanoma-left eye   Clinical depression 00/93/8182   Complication of anesthesia    PT HAD TROUBLE BREATHING AFTER HYSTERECTOMY IN OCTOBER   Depression    Dizziness    Excess weight 09/30/2013   GERD (gastroesophageal reflux disease)    Headache, migraine 09/30/2013   Heart murmur    Heavy drinker 04/11/2014   Overview:  Patient reports drinking 2-3 bottles of wine each weekend.     History of methicillin resistant staphylococcus aureus (MRSA) 11/2015   Infection of urinary tract 09/30/2013   Irregular bleeding 09/30/2013   Multiple allergies    VSD (ventricular septal defect and aortic arch hypoplasia     Past Surgical History: Past Surgical History:  Procedure Laterality Date   ABDOMINAL HYSTERECTOMY     BOTOX INJECTION N/A 10/23/2016   Procedure: BOTOX INJECTION;  Surgeon: Royston Cowper, MD;  Location: [facility];  Service: Urology;  Laterality: N/A;   BREAST BIOPSY Left 11/13/13   benign   CESAREAN SECTION     COLONOSCOPY     COLONOSCOPY WITH PROPOFOL N/A 07/04/2020   Procedure: COLONOSCOPY WITH PROPOFOL;  Surgeon: Lesly Rubenstein, MD;  Location: [facility];  Service: Endoscopy;  Laterality: N/A;   CORONARY ANGIOPLASTY     CYSTOSCOPY N/A 10/23/2016   Procedure: CYSTOSCOPY;  Surgeon: Royston Cowper, MD;  Location: [facility];  Service: Urology;  Laterality: N/A;   CYSTOSCOPY WITH HYDRODISTENSION AND BIOPSY     LAPAROSCOPIC ASSISTED VAGINAL HYSTERECTOMY N/A 08/20/2016   Procedure: LAPAROSCOPIC ASSISTED VAGINAL HYSTERECTOMY;  Surgeon: Boykin Nearing, MD;  Location: [facility];  Service: Gynecology;  Laterality: N/A;   LAPAROSCOPIC BILATERAL SALPINGECTOMY Bilateral 08/20/2016   Procedure: LAPAROSCOPIC BILATERAL SALPINGECTOMY;  Surgeon: Boykin Nearing, MD;  Location: [facility];  Service: Gynecology;  Laterality: Bilateral;   NASAL SINUS SURGERY     x12    Social History: Social History   Socioeconomic History   Marital status: Married    Spouse name: greg     Number of children: 2  Years of education: Not on file   Highest education level: Associate degree: occupational, Hotel manager, or vocational program  Occupational History   Not on file  Tobacco Use   Smoking status: Former    Pack years: 0.00    Types: Cigarettes    Quit date: 11/12/2006    Years since quitting: 14.5   Smokeless tobacco: Never   Tobacco comments:    quit 2008  Vaping Use   Vaping Use: Never used  Substance and Sexual Activity   Alcohol use: No    Alcohol/week: 0.0 standard drinks   Drug use: No   Sexual activity: Yes    Birth control/protection: None  Other Topics Concern   Not on file  Social History Narrative   Not on file   Social Determinants of Health   Financial Resource Strain: Not on file  Food Insecurity: Not on file  Transportation Needs: Not on file  Physical Activity: Not on file  Stress: Not on file  Social Connections: Not on file  Intimate Partner Violence: Not on file    Family History: Family History  Problem Relation Age of Onset   Breast cancer Maternal Aunt    Breast cancer Paternal Aunt    Breast cancer Maternal Grandmother    Breast cancer Paternal Grandmother    Breast cancer Paternal Aunt    Bipolar disorder Paternal Aunt    Kidney cancer Maternal Uncle    Prostate cancer Paternal Grandfather    Kidney disease Other        father's side   Aneurysm Mother    Liver disease Mother    Drug abuse Mother    Alcohol abuse Mother    Anxiety disorder Mother    Depression Mother    Cancer Father    Anxiety disorder Father    Depression Father     Review of Systems: Review of Systems  Constitutional: Negative.   Respiratory: Negative.    Cardiovascular: Negative.   Gastrointestinal: Negative.   Musculoskeletal: Negative.   Skin:  Positive for rash.  Neurological: Negative.    Physical Exam: Vital Signs BP 120/76 (BP Location: Left Arm, Patient Position: Sitting, Cuff Size: Normal)   Pulse 82   Ht 5\' 2"  (1.575 m)    Wt 163 lb (73.9 kg)   LMP 07/17/2016 (Approximate)   SpO2 96%   BMI 29.81 kg/m   Physical Exam Constitutional:      General: Not in acute distress.    Appearance: Normal appearance. Not ill-appearing.  HENT:     Head: Normocephalic and atraumatic.  Eyes:     Pupils: Pupils are equal, round Neck:     Musculoskeletal: Normal range of motion.  Cardiovascular:     Rate and Rhythm: Normal rate    Pulses: Normal pulses.  Pulmonary:     Effort: Pulmonary effort is normal. No respiratory distress.  Abdominal:     General: Abdomen is flat. There is no distension.  Musculoskeletal: Normal range of motion.  Skin:    General: Skin is warm and dry.     Findings: No erythema or rash.  Neurological:     General: No focal deficit present.     Mental Status: Alert and oriented to person, place, and time. Mental status is at baseline.     Motor: No weakness.  Psychiatric:        Mood and Affect: Mood normal.        Behavior: Behavior normal.    Assessment/Plan: The patient is scheduled  for bilateral breast reduction and infraumbilical panniculectomy with upper abdominoplasty add-on and plication and liposuction of abdomen with Dr. Claudia Desanctis.  Risks, benefits, and alternatives of procedure discussed, questions answered and consent obtained.    Smoking Status: Quit smoking 2008; Counseling Given?  N/A Last Mammogram: 02/27/2021; Results: Negative  Caprini Score: 4, moderate; Risk Factors include: Age, BMI greater than 25, and length of planned surgery. Recommendation for mechanical and pharmacological prophylaxis while hospitalized. Encourage early ambulation.   Pictures obtained:@Consult   Post-op Rx sent to pharmacy: Norco, Zofran Patient reports she has a history of itching with hydrocodone.  She reports that she is unable to tolerate oxycodone.  She reports that tramadol does not provide her any relief.  She reports that she is able to tolerate hydrocodone if she also takes it with an  allergy medicine.  She reports that she has never had any anaphylactic reaction to this.  She reports that she is comfortable taking this again for any severe postop pain.  Patient was provided with the breast reduction and General Surgical Risk consent document and Pain Medication Agreement prior to their appointment.  They had adequate time to read through the risk consent documents and Pain Medication Agreement. We also discussed them in person together during this preop appointment. All of their questions were answered to their satisfaction.  Recommended calling if they have any further questions.  Risk consent form and Pain Medication Agreement to be scanned into patient's chart.  The risk that can be encountered with breast reduction were discussed and include the following but not limited to these:  Breast asymmetry, fluid accumulation, firmness of the breast, inability to breast feed, loss of nipple or areola, skin loss, decrease or no nipple sensation, fat necrosis of the breast tissue, bleeding, infection, healing delay.  There are risks of anesthesia, changes to skin sensation and injury to nerves or blood vessels.  The muscle can be temporarily or permanently injured.  You may have an allergic reaction to tape, suture, glue, blood products which can result in skin discoloration, swelling, pain, skin lesions, poor healing.  Any of these can lead to the need for revisonal surgery or stage procedures.  A reduction has potential to interfere with diagnostic procedures.  Nipple or breast piercing can increase risks of infection.  This procedure is best done when the breast is fully developed.  Changes in the breast will continue to occur over time.  Pregnancy can alter the outcomes of previous breast reduction surgery, weight gain and weigh loss can also effect the long term appearance.   The risk that can be encountered for this procedure were discussed and include the following but not limited to  these: asymmetry, fluid accumulation, firmness of the tissue, skin loss, decrease or no sensation, fat necrosis, bleeding, infection, healing delay.  Deep vein thrombosis, cardiac and pulmonary complications are risks to any procedure.  There are risks of anesthesia, changes to skin sensation and injury to nerves or blood vessels.  The muscle can be temporarily or permanently injured.  You may have an allergic reaction to tape, suture, glue, blood products which can result in skin discoloration, swelling, pain, skin lesions, poor healing.  Any of these can lead to the need for revisonal surgery or stage procedures.  Weight gain and weigh loss can also effect the long term appearance. The results are not guaranteed to last a lifetime.  Future surgery may be required.    The risks that can be encountered with and after  liposuction were discussed and include the following but no limited to these:  Asymmetry, fluid accumulation, firmness of the area, fat necrosis with death of fat tissue, bleeding, infection, delayed healing, anesthesia risks, skin sensation changes, injury to structures including nerves, blood vessels, and muscles which may be temporary or permanent, allergies to tape, suture materials and glues, blood products, topical preparations or injected agents, skin and contour irregularities, skin discoloration and swelling, deep vein thrombosis, cardiac and pulmonary complications, pain, which may persist, persistent pain, recurrence of the lesion, poor healing of the incision, possible need for revisional surgery or staged procedures. Thiere can also be persistent swelling, poor wound healing, rippling or loose skin, worsening of cellulite, swelling, and thermal burn or heat injury from ultrasound with the ultrasound-assisted lipoplasty technique. Any change in weight fluctuations can alter the outcome.   Electronically signed by: Carola Rhine Lanett Lasorsa, PA-C 05/16/2021 4:24 PM

## 2021-05-16 NOTE — Addendum Note (Signed)
Addended byRoetta Sessions on: 05/16/2021 04:35 PM   Modules accepted: Orders

## 2021-05-18 ENCOUNTER — Other Ambulatory Visit: Payer: Self-pay

## 2021-05-18 ENCOUNTER — Telehealth: Payer: Self-pay | Admitting: Plastic Surgery

## 2021-05-18 MED ORDER — OTEZLA 30 MG PO TABS: 30.0000 mg | ORAL_TABLET | Freq: Two times a day (BID) | ORAL | 5 refills | Status: DC

## 2021-05-18 NOTE — Addendum Note (Signed)
Addended by: Johnsie Kindred R on: 05/18/2021 04:20 PM   Modules accepted: Orders

## 2021-05-18 NOTE — Telephone Encounter (Signed)
Patient called to ask about which medications she needs to stop taking before sx 7/22. Please call to advise [phone]. Thanks.

## 2021-05-18 NOTE — Progress Notes (Signed)
RX sent in to [facility] to be processed.

## 2021-05-19 ENCOUNTER — Telehealth: Payer: Self-pay | Admitting: Plastic Surgery

## 2021-05-19 NOTE — Telephone Encounter (Signed)
Patient called to ask about which medications she needs to stop taking before sx 7/22. Please call to advise [phone]. Thanks.

## 2021-05-19 NOTE — Telephone Encounter (Signed)
Matthew,PA-C called and spoke with the patient regarding the message below.//AB/CMA

## 2021-05-24 ENCOUNTER — Emergency Department
Admission: EM | Admit: 2021-05-24 | Discharge: 2021-05-24 | Disposition: A | Discharge status: Home / Self Care | Payer: BC Managed Care – PPO | Attending: Emergency Medicine | Admitting: Emergency Medicine

## 2021-05-24 ENCOUNTER — Encounter: Payer: Self-pay | Admitting: Emergency Medicine

## 2021-05-24 ENCOUNTER — Other Ambulatory Visit: Payer: Self-pay

## 2021-05-24 DIAGNOSIS — Z8541 Personal history of malignant neoplasm of cervix uteri: Secondary | ICD-10-CM | POA: Insufficient documentation

## 2021-05-24 DIAGNOSIS — Z85828 Personal history of other malignant neoplasm of skin: Secondary | ICD-10-CM | POA: Insufficient documentation

## 2021-05-24 DIAGNOSIS — Z79899 Other long term (current) drug therapy: Secondary | ICD-10-CM | POA: Diagnosis not present

## 2021-05-24 DIAGNOSIS — Z85840 Personal history of malignant neoplasm of eye: Secondary | ICD-10-CM | POA: Insufficient documentation

## 2021-05-24 DIAGNOSIS — K643 Fourth degree hemorrhoids: Secondary | ICD-10-CM | POA: Diagnosis not present

## 2021-05-24 DIAGNOSIS — Z7951 Long term (current) use of inhaled steroids: Secondary | ICD-10-CM | POA: Insufficient documentation

## 2021-05-24 DIAGNOSIS — J45909 Unspecified asthma, uncomplicated: Secondary | ICD-10-CM | POA: Diagnosis not present

## 2021-05-24 DIAGNOSIS — Z955 Presence of coronary angioplasty implant and graft: Secondary | ICD-10-CM | POA: Diagnosis not present

## 2021-05-24 DIAGNOSIS — Z91040 Latex allergy status: Secondary | ICD-10-CM | POA: Diagnosis not present

## 2021-05-24 DIAGNOSIS — Z87891 Personal history of nicotine dependence: Secondary | ICD-10-CM | POA: Diagnosis not present

## 2021-05-24 DIAGNOSIS — K6289 Other specified diseases of anus and rectum: Secondary | ICD-10-CM | POA: Diagnosis present

## 2021-05-24 MED ORDER — DIBUCAINE (PERIANAL) 1 % EX OINT: 1.0000 "application " | TOPICAL_OINTMENT | CUTANEOUS | 0 refills | Status: DC | PRN

## 2021-05-24 MED ORDER — POLYETHYLENE GLYCOL 3350 17 G PO PACK: 17.0000 g | PACK | Freq: Every day | ORAL | 0 refills | Status: DC

## 2021-05-24 MED ORDER — HYDROCORTISONE ACETATE 25 MG RE SUPP: 25.0000 mg | Freq: Two times a day (BID) | RECTAL | 1 refills | Status: AC

## 2021-05-24 NOTE — ED Triage Notes (Signed)
Pt comes into the ED via POV c/o hemorrhoids.  Pt states she has had some bright red blood when she wipes.  Pt in NAd at this time.

## 2021-05-24 NOTE — Discharge Instructions (Addendum)
Use Anusol twice daily for the next 6 days. Apply Dibucaine to rectum for pain relief. Take MiraLAX once daily.

## 2021-05-24 NOTE — ED Provider Notes (Signed)
[facility]EMERGENCY DEPARTMENT  ____________________________________________  Time seen: Approximately 8:05 PM  I have reviewed the triage vital signs and the nursing notes.   HISTORY  Chief Complaint Hemorrhoids   Historian Patient    HPI Kristine [name] is a 49 y.o. female presents to the emergency department with rectal pain and bright red blood per rectum.  Patient states that she has been constipated and is concerned for possible hemorrhoid.  No prior history of hemorrhoids in the past.  Patient denies history of anemia.  No other alleviating measures have been attempted.   Past Medical History:  Diagnosis Date   Abnormal weight gain 08/05/2013   Absence of interventricular septum 08/05/2013   Allergic rhinitis 04/17/2016   Allergic urticaria due to ingested food 02/09/2016   Anxiety    Asthma    Cancer ([place])    melanoma-left eye   Clinical depression 84/69/6295   Complication of anesthesia    PT HAD TROUBLE BREATHING AFTER HYSTERECTOMY IN OCTOBER   Depression    Dizziness    Excess weight 09/30/2013   GERD (gastroesophageal reflux disease)    Headache, migraine 09/30/2013   Heart murmur    Heavy drinker 04/11/2014   Overview:  Patient reports drinking 2-3 bottles of wine each weekend.     History of methicillin resistant staphylococcus aureus (MRSA) 11/2015   Infection of urinary tract 09/30/2013   Irregular bleeding 09/30/2013   Multiple allergies    VSD (ventricular septal defect and aortic arch hypoplasia      Immunizations up to date:  Yes.     Past Medical History:  Diagnosis Date   Abnormal weight gain 08/05/2013   Absence of interventricular septum 08/05/2013   Allergic rhinitis 04/17/2016   Allergic urticaria due to ingested food 02/09/2016   Anxiety    Asthma    Cancer ([place])    melanoma-left eye   Clinical depression 28/41/3244   Complication of anesthesia    PT HAD TROUBLE BREATHING AFTER HYSTERECTOMY IN OCTOBER   Depression    Dizziness     Excess weight 09/30/2013   GERD (gastroesophageal reflux disease)    Headache, migraine 09/30/2013   Heart murmur    Heavy drinker 04/11/2014   Overview:  Patient reports drinking 2-3 bottles of wine each weekend.     History of methicillin resistant staphylococcus aureus (MRSA) 11/2015   Infection of urinary tract 09/30/2013   Irregular bleeding 09/30/2013   Multiple allergies    VSD (ventricular septal defect and aortic arch hypoplasia     Patient Active Problem List   Diagnosis Date Noted   Sinusitis accessory 02/03/2021   GAD (generalized anxiety disorder) 06/22/2019   Insomnia due to medical condition 06/22/2019   Needle phobia 06/22/2019   Basal cell carcinoma (BCC) in situ of skin 03/14/2019   Bilateral carpal tunnel syndrome 08/27/2018   Intractable migraine with aura without status migrainosus 08/27/2018   Polyneuropathy 08/27/2018   Cervical cancer ([place]) 08/18/2018   Basal cell carcinoma, eyelid, left 02/14/2018   Chest pain 09/23/2017   Elevated TSH 07/03/2017   Chronic otitis externa of both ears 10/15/2016   Disequilibrium 09/19/2016   Menorrhagia with irregular cycle 09/17/2016   Chronic idiopathic urticaria 09/03/2016   Postoperative state 08/20/2016   Calcaneal spur 07/10/2016   Plantar fasciitis 07/10/2016   On prednisone therapy 06/20/2016   Allergic rhinitis 04/17/2016   Allergic urticaria due to ingested food 02/09/2016   Heavy drinker 04/11/2014   Depression 09/30/2013   Irregular bleeding 09/30/2013  Migraine headache 09/30/2013   Excess weight 09/30/2013   Frequent UTI 09/30/2013   Infection of urinary tract 09/30/2013   VSD (ventricular septal defect and aortic arch hypoplasia 08/05/2013   Abnormal weight gain 08/05/2013    Past Surgical History:  Procedure Laterality Date   ABDOMINAL HYSTERECTOMY     BOTOX INJECTION N/A 10/23/2016   Procedure: BOTOX INJECTION;  Surgeon: Royston Cowper, MD;  Location: [facility] ORS;  Service: Urology;   Laterality: N/A;   BREAST BIOPSY Left 11/13/13   benign   CESAREAN SECTION     COLONOSCOPY     COLONOSCOPY WITH PROPOFOL N/A 07/04/2020   Procedure: COLONOSCOPY WITH PROPOFOL;  Surgeon: Lesly Rubenstein, MD;  Location: [facility];  Service: Endoscopy;  Laterality: N/A;   CORONARY ANGIOPLASTY     CYSTOSCOPY N/A 10/23/2016   Procedure: CYSTOSCOPY;  Surgeon: Royston Cowper, MD;  Location: [facility] OR[facility];  Service: Urology;  Laterality: N/A;   CYSTOSCOPY WITH HYDRODISTENSION AND BIOPSY     LAPAROSCOPIC ASSISTED VAGINAL HYSTERECTOMY N/A 08/20/2016   Procedure: LAPAROSCOPIC ASSISTED VAGINAL HYSTERECTOMY;  Surgeon: Boykin Nearing, MD;  Location: [facility] ORS;  Service: Gynecology;  Laterality: N/A;   LAPAROSCOPIC BILATERAL SALPINGECTOMY Bilateral 08/20/2016   Procedure: LAPAROSCOPIC BILATERAL SALPINGECTOMY;  Surgeon: Boykin Nearing, MD;  Location: [facility] ORS;  Service: Gynecology;  Laterality: Bilateral;   NASAL SINUS SURGERY     x12    Prior to Admission medications   Medication Sig Start Date End Date Taking? Authorizing Provider  dibucaine (NUPERCAINAL) 1 % OINT Place 1 application rectally as needed for hemorrhoids. 05/24/21  Yes Vallarie Mare M, PA-C  hydrocortisone (ANUSOL-HC) 25 MG suppository Place 1 suppository (25 mg total) rectally 2 (two) times daily for 6 days. 05/24/21 05/30/21 Yes Vallarie Mare M, PA-C  polyethylene glycol (MIRALAX) 17 g packet Take 17 g by mouth daily. 05/24/21  Yes Vallarie Mare M, PA-C  albuterol (PROVENTIL HFA;VENTOLIN HFA) 108 (90 Base) MCG/ACT inhaler Inhale 2 puffs into the lungs every 6 (six) hours as needed for wheezing or shortness of breath. 04/09/17   Merlyn Lot, MD  ALPRAZolam Duanne Moron) 0.5 MG tablet Take 1 tablet (0.5 mg total) by mouth as directed. Take it only once a day as needed for severe panic symptoms 06/08/19   Ursula Alert, MD  Apremilast (OTEZLA) 30 MG TABS Take 1 tablet (30 mg total) by mouth 2 (two) times daily. 05/18/21   Ralene Bathe, MD  ATROVENT [facility] 17 MCG/ACT inhaler  11/30/19   [provider]  azelastine (ASTELIN) 0.1 % nasal spray  10/16/18   [provider]  azelastine (OPTIVAR) 0.05 % ophthalmic solution Place 1 drop into both eyes daily.    [provider]  Azelastine HCl 0.15 % SOLN Place 2 sprays into the nose 2 (two) times daily.     [provider]  Biotin 10000 MCG TABS Take 10,000 mg by mouth daily.    [provider]  Botulinum Toxin Type A (BOTOX) 200 units SOLR INJECT 200 UNITS  INTRAMUSCULARLY EVERY 3  MONTHS (GIVEN AT MD OFFICE, DISCARD UNUSED) 05/24/20   [provider]  budesonide (PULMICORT) 1 MG/2ML nebulizer solution Inhale into the lungs. 10/13/19 10/12/20  [provider]  cetirizine (ZYRTEC) 10 MG tablet Take 2 tablets by mouth 2 (two) times daily.     [provider]  CRANBERRY PO Take 1 capsule by mouth at bedtime.     [provider]  cyanocobalamin (,VITAMIN B-12,) 1000 MCG/ML injection  INJECT ONE ML INTO THE MUSCLE ONCE WEEKLY FOR FOUR WEEKS. THEN INJECT 1ML INTO THE MUSCLE ONCE MONTHY FOR FOUR MONTHS 02/05/19   [provider]  desvenlafaxine (PRISTIQ) 100 MG 24 hr tablet Take 1 tablet (100 mg total) by mouth daily. 01/16/21   Ursula Alert, MD  EPINEPHrine 0.3 mg/0.3 mL IJ SOAJ injection Inject 0.3 mLs (0.3 mg total) into the muscle once. 02/13/16   Nance Pear, MD  fluticasone (FLONASE) 50 MCG/ACT nasal spray Place 1 spray into both nostrils 2 (two) times daily.     [provider]  fluticasone (FLOVENT HFA) 220 MCG/ACT inhaler INHALE 2 PUFFS BY MOUTH TWICE DAILY 03/30/18   [provider]  Fluticasone-Salmeterol (ADVAIR) 250-50 MCG/DOSE AEPB Inhale 1 puff into the lungs 2 (two) times daily.    [provider]  Fremanezumab-vfrm (AJOVY) 225 MG/1.5ML SOAJ  01/27/21   [provider]  hydrochlorothiazide (HYDRODIURIL) 25 MG tablet Take 25 mg by mouth daily.     [provider]  ipratropium (ATROVENT HFA) 17 MCG/ACT inhaler Inhale into the lungs. 10/13/19 10/12/20  [provider]  ipratropium (ATROVENT) 0.03 % nasal spray Place into both nostrils. 05/24/20   [provider]  levocetirizine (XYZAL) 5 MG tablet Take 2 tablets by mouth twice daily 07/17/19   [provider]  levothyroxine (SYNTHROID, LEVOTHROID) 50 MCG tablet Take 50 mcg daily, any generic manufacturer is okay but try to stay with same manufacturer as much as possible 04/15/18   [provider]  magnesium oxide (MAG-OX) 400 MG tablet Take 400 mg by mouth daily.    [provider]  mometasone (ELOCON) 0.1 % ointment Apply topically. 02/20/21   [provider]  montelukast (SINGULAIR) 10 MG tablet Take 10 mg by mouth at bedtime.     [provider]  Multiple Vitamins-Minerals (ZINC PO) Take 1 tablet by mouth daily.    [provider]  mupirocin ointment (BACTROBAN) 2 % Apply 1 application topically See admin instructions. Apply 1 to 3 times daily to any open sores 03/27/21   Ralene Bathe, MD  nitrofurantoin, macrocrystal-monohydrate, (MACROBID) 100 MG capsule Take by mouth.    [provider]  omalizumab Arvid Right) 150 MG injection INJECT 300MG  SUBCUTANEOUSLY EVERY 4 WEEKS (GIVEN AT PRESCRIBERS OFFICE) 07/30/18   [provider]  ondansetron (ZOFRAN) 4 MG tablet Take 1 tablet (4 mg total) by mouth every 8 (eight) hours as needed for nausea or vomiting. 05/16/21   Scheeler, Carola Rhine, PA-C  Pediatric Multiple Vit-C-FA (CHEWABLE VITE CHILDRENS) CHEW Chew by mouth.    [provider]  potassium chloride (KLOR-CON) 10 MEQ tablet potassium chloride ER 10 mEq tablet,extended release    [provider]  RELION PEN NEEDLES 29G X 12MM MISC USE 1 PEN NEEDLE ONCE DAILY AS DIRECTED 12/05/18   [provider]  Semaglutide,0.25 or 0.5MG /DOS, (OZEMPIC, 0.25 OR 0.5 MG/DOSE,) 2 MG/1.5ML SOPN Start  0.25 mg/week and increase after one month to 0.5 mg/week as directed by MD 08/22/20 08/22/21  [provider]  triamcinolone cream (KENALOG) 0.1 % Apply topically. 11/17/20   [provider]  vitamin B-12 (CYANOCOBALAMIN) 1000 MCG tablet Take 1,000 mcg by mouth daily.    [provider]  vitamin C (ASCORBIC ACID) 500 MG tablet Take 500 mg by mouth at bedtime.    [provider]  WYNZORA 0.005-0.064 % CREA APPLY ONE APPLICATION TOPICALLY TWO TIMES DAILY. 05/01/21   Ralene Bathe, MD    Allergies Bee pollen,  Bee venom, Eggs or egg-derived products, Keflex [cephalexin], Lac bovis, Lactase, Milk-related compounds, Penicillins, Shellfish allergy, Soy allergy, Topamax [topiramate], Wheat bran, Butalbital-apap-caffeine, Codeine, Ferrous gluconate, Gabapentin, Hydrocodone, Iron, Meclizine, Red dye, Iodine, and Latex  Family History  Problem Relation Age of Onset   Breast cancer Maternal Aunt    Breast cancer Paternal Aunt    Breast cancer Maternal Grandmother    Breast cancer Paternal Grandmother    Breast cancer Paternal Aunt    Bipolar disorder Paternal Aunt    Kidney cancer Maternal Uncle    Prostate cancer Paternal Grandfather    Kidney disease Other        father's side   Aneurysm Mother    Liver disease Mother    Drug abuse Mother    Alcohol abuse Mother    Anxiety disorder Mother    Depression Mother    Cancer Father    Anxiety disorder Father    Depression Father     Social History Social History   Tobacco Use   Smoking status: Former    Pack years: 0.00    Types: Cigarettes    Quit date: 11/12/2006    Years since quitting: 14.5   Smokeless tobacco: Never   Tobacco comments:    quit 2008  Vaping Use   Vaping Use: Never used  Substance Use Topics   Alcohol use: No    Alcohol/week: 0.0 standard drinks   Drug use: No     Review of Systems  Constitutional: No fever/chills Eyes:  No discharge ENT: No upper respiratory  complaints. Respiratory: no cough. No SOB/ use of accessory muscles to breath Gastrointestinal: Patient has hemorrhoid.  Musculoskeletal: Negative for musculoskeletal pain. Skin: Negative for rash, abrasions, lacerations, ecchymosis.    ____________________________________________   PHYSICAL EXAM:  VITAL SIGNS: ED Triage Vitals  Enc Vitals Group     BP 05/24/21 1730 113/66     Pulse Rate 05/24/21 1730 82     Resp 05/24/21 1730 20     Temp 05/24/21 1730 98.4 F (36.9 C)     Temp Source 05/24/21 1730 Oral     SpO2 05/24/21 1730 99 %     Weight 05/24/21 1728 162 lb 14.7 oz (73.9 kg)     Height 05/24/21 1728 5\' 2"  (1.575 m)     Head Circumference --      Peak Flow --      Pain Score 05/24/21 1727 0     Pain Loc --      Pain Edu? --      Excl. in [place]? --      Constitutional: Alert and oriented. Well appearing and in no acute distress. Eyes: Conjunctivae are normal. PERRL. EOMI. Head: Atraumatic. ENT: Cardiovascular: Normal rate, regular rhythm. Normal S1 and S2.  Good peripheral circulation. Respiratory: Normal respiratory effort without tachypnea or retractions. Lungs CTAB. Good air entry to the bases with no decreased or absent breath sounds Gastrointestinal: Bowel sounds x 4 quadrants. Soft and nontender to palpation. No guarding or rigidity. No distention.  Patient has grade 4 nonthrombosed hemorrhoid. Musculoskeletal: Full range of motion to all extremities. No obvious deformities noted Neurologic:  Normal for age. No gross focal neurologic deficits are appreciated.  Skin:  Skin is warm, dry and intact. No rash noted. Psychiatric: Mood and affect are normal for age. Speech and behavior are normal.   ____________________________________________   LABS (all labs ordered are listed, but only abnormal results are displayed)  Labs Reviewed - No data to  display ____________________________________________  EKG   ____________________________________________  RADIOLOGY   No results found.  ____________________________________________    PROCEDURES  Procedure(s) performed:     Procedures     Medications - No data to display   ____________________________________________   INITIAL IMPRESSION / ASSESSMENT AND PLAN / ED COURSE  Pertinent labs & imaging results that were available during my care of the patient were reviewed by me and considered in my medical decision making (see chart for details).    Assessment and plan Grade 4 nonthrombosed hemorrhoid 50 year old female presents to the emerge [place] department with a grade 4 nonthrombosed hemorrhoid.  Patient was discharged with Anusol, Dibucaine and MiraLAX and advised to follow-up with general surgery if symptoms do not improve.  All patient questions were answered.      ____________________________________________  FINAL CLINICAL IMPRESSION(S) / ED DIAGNOSES  Final diagnoses:  Grade IV hemorrhoids      NEW MEDICATIONS STARTED DURING THIS VISIT:  ED Discharge Orders          Ordered    hydrocortisone (ANUSOL-HC) 25 MG suppository  2 times daily        05/24/21 1848    dibucaine (NUPERCAINAL) 1 % OINT  As needed        05/24/21 1848    polyethylene glycol (MIRALAX) 17 g packet  Daily        05/24/21 1848                This chart was dictated using voice recognition software/Dragon. Despite best efforts to proofread, errors can occur which can change the meaning. Any change was purely unintentional.     Karren Cobble 05/24/21 Fayne Mediate, MD 05/24/21 2316

## 2021-05-25 ENCOUNTER — Telehealth: Payer: Self-pay | Admitting: Plastic Surgery

## 2021-05-25 NOTE — Telephone Encounter (Signed)
Patient called to advise Dr. Claudia Desanctis of ED visit she had yesterday for hemorrhoids. ED staff did not think it would affect her upcoming surgery but that she should mention it to Dr. Claudia Desanctis and staff. Please call patient if there is an issue. Patient would like call back from nurse

## 2021-05-25 NOTE — Telephone Encounter (Signed)
I received a message from [place] in anesthesia/PAT at [facility] regarding the concern for surgery in relation to the recent ED visit. I have spoken with the patient's GI office, patient is scheduled to see Dr. Darron Doom on 8/2. Dr. Bary Castilla reviewed the ED note with his nurse Selinda Eon, and has indicated that we should be ok to proceed as planned, as long as we can try to keep the patient from becoming constipated. Dr. Claudia Desanctis was given this information and had me discuss the options with the patient. We offered to postpone the surgery until after her visit with Dr. Bary Castilla, but the patient declined. She stated that she "has a very high pain tolerance and did not even realize she was having any problem until her husband pointed it out." The patient indicated that she does not plan to take any pain medications, as she has an allergic/advserse reaction to most pain meds. She is more than happy to take stool softeners or other medications to help her from becoming constipated. She would like to proceed with the surgery as planned. Dr. Claudia Desanctis and the anesthesia/PAT team have been made aware of this information.

## 2021-05-28 ENCOUNTER — Encounter: Payer: Self-pay | Admitting: Plastic Surgery

## 2021-05-30 ENCOUNTER — Telehealth: Payer: Self-pay

## 2021-05-30 NOTE — Telephone Encounter (Signed)
Patient called to say that she is having surgery on Friday and she takes her last suppository today.  Patient would like to know if/when she needs to stop taking her Miralax.  Please call.

## 2021-05-30 NOTE — Telephone Encounter (Signed)
Returned patients call. She has recently developed hemorrhoids last week and has been using suppositories and is finished with dosage. Suggested to drink plenty of water, continue taking the Miralax, or use Milk of Magnesia especially when taking the Hydrocodone-Acetaminophen that can cause constipation. Patient understood and agree with plan.

## 2021-05-31 ENCOUNTER — Ambulatory Visit (INDEPENDENT_AMBULATORY_CARE_PROVIDER_SITE_OTHER): Payer: BC Managed Care – PPO | Admitting: Dermatology

## 2021-05-31 ENCOUNTER — Other Ambulatory Visit: Payer: Self-pay

## 2021-05-31 DIAGNOSIS — Z872 Personal history of diseases of the skin and subcutaneous tissue: Secondary | ICD-10-CM | POA: Diagnosis not present

## 2021-05-31 DIAGNOSIS — L578 Other skin changes due to chronic exposure to nonionizing radiation: Secondary | ICD-10-CM | POA: Diagnosis not present

## 2021-05-31 DIAGNOSIS — L409 Psoriasis, unspecified: Secondary | ICD-10-CM | POA: Diagnosis not present

## 2021-05-31 NOTE — Patient Instructions (Addendum)
If you have any questions or concerns for your doctor, please call our main line at [phone] and press option 4 to reach your doctor's medical assistant. If no one answers, please leave a voicemail as directed and we will return your call as soon as possible. Messages left after 4 pm will be answered the following business day.   You may also send us a message via MyChart. We typically respond to MyChart messages within 1-2 business days.  For prescription refills, please ask your pharmacy to contact our office. Our fax number is [phone].  If you have an urgent issue when the clinic is closed that cannot wait until the next business day, you can page your doctor at the number below.    Please note that while we do our best to be available for urgent issues outside of office hours, we are not available 24/7.   If you have an urgent issue and are unable to reach us, you may choose to seek medical care at your doctor's office, retail clinic, urgent care center, or emergency room.  If you have a medical emergency, please immediately call 911 or go to the emergency department.  Pager Numbers  - Dr. Kowalski: [phone]  - Dr. Moye: [phone]  - Dr. Stewart: [phone]  In the event of inclement weather, please call our main line at [phone] for an update on the status of any delays or closures.  Dermatology Medication Tips: Please keep the boxes that topical medications come in in order to help keep track of the instructions about where and how to use these. Pharmacies typically print the medication instructions only on the boxes and not directly on the medication tubes.   If your medication is too expensive, please contact our office at [phone] option 4 or send us a message through MyChart.   We are unable to tell what your co-pay for medications will be in advance as this is different depending on your insurance coverage. However, we may be able to find a substitute  medication at lower cost or fill out paperwork to get insurance to cover a needed medication.   If a prior authorization is required to get your medication covered by your insurance company, please allow us 1-2 business days to complete this process.  Drug prices often vary depending on where the prescription is filled and some pharmacies may offer cheaper prices.  The website www.goodrx.com contains coupons for medications through different pharmacies. The prices here do not account for what the cost may be with help from insurance (it may be cheaper with your insurance), but the website can give you the price if you did not use any insurance.  - You can print the associated coupon and take it with your prescription to the pharmacy.  - You may also stop by our office during regular business hours and pick up a GoodRx coupon card.  - If you need your prescription sent electronically to a different pharmacy, notify our office through [facility] MyChart or by phone at [phone] option 4.  Instructions for [facility] Medications  One or more of your medications was sent to the [facility] mail order compounding pharmacy. You will receive an email from them and can purchase the medicine through that link. It will then be mailed to your home at the address you confirmed. If for any reason you do not receive an email from them, please check your spam folder. If you still do not find the email,   please let us know. [facility] phone number is [phone].   

## 2021-05-31 NOTE — Progress Notes (Signed)
Follow-Up Visit   Subjective  Kristine [name] is a 50 y.o. female who presents for the following: Psoriasis (Elbows, knees, scalp, ears, hands, Improved 74m f/u Otezla 30mg  1 po qd, no s/e no headache, no nausea, no depression, no weight loss, no diarrhea,  Wynzora cr bid) and Hx of cellulitis (Scalp, was clear last visit but pt would like you to check again).  The following portions of the chart were reviewed this encounter and updated as appropriate:   Tobacco  Allergies  Meds  Problems  Med Hx  Surg Hx  Fam Hx     Review of Systems:  No other skin or systemic complaints except as noted in HPI or Assessment and Plan.  Objective  Well appearing patient in no apparent distress; mood and affect are within normal limits.  A focused examination was performed including face, scalp, elbows, knees, hands, flank. Relevant physical exam findings are noted in the Assessment and Plan.  scalp, ears, hands, elbows, knees Pinkness R waistline, crust and excoriations post scalp, pinkness R elbow,   face Actinic changes with minimal pink scaly macules   Assessment & Plan  Psoriasis scalp, ears, hands, elbows, knees  Psoriasis is a chronic non-curable, but treatable genetic/hereditary disease that may have other systemic features affecting other organ systems such as joints (Psoriatic Arthritis). It is associated with an increased risk of inflammatory bowel disease, heart disease, non-alcoholic fatty liver disease, and depression.     Increase to Otezla 30mg  1 po BID, Patient instructed on titrating up to bid, samples x 3 [id number], exp 11/11/22 Cont Wynzora cr bid Start Mupirocin oint qd to open sores (pt has prescription at home)  Side effects of Otezla (apremilast) include diarrhea, nausea, headache, upper respiratory infection, depression, and weight decrease (5-10%). It should only be taken by pregnant women after a discussion regarding risks and benefits with their doctor. Goal is  control of skin condition, not cure.  The use of [place] Nail requires long term medication management, including periodic office visits.   Related Medications mupirocin ointment (BACTROBAN) 2 % Apply 1 application topically See admin instructions. Apply 1 to 3 times daily to any open sores  Actinic skin damage face Actinic Damage - Severe, confluent actinic changes with pre-cancerous actinic keratoses  - Severe, chronic, not at goal, secondary to cumulative UV radiation exposure over time - diffuse scaly erythematous macules and papules with underlying dyspigmentation - Discussed Prescription "Field Treatment" for Severe, Chronic Confluent Actinic Changes with Pre-Cancerous Actinic Keratoses Field treatment involves treatment of an entire area of skin that has confluent Actinic Changes (Sun/ Ultraviolet light damage) and PreCancerous Actinic Keratoses by method of PhotoDynamic Therapy (PDT) and/or prescription Topical Chemotherapy agents such as 5-fluorouracil, 5-fluorouracil/calcipotriene, and/or imiquimod.  The purpose is to decrease the number of clinically evident and subclinical PreCancerous lesions to prevent progression to development of skin cancer by chemically destroying early precancer changes that may or may not be visible.  It has been shown to reduce the risk of developing skin cancer in the treated area. As a result of treatment, redness, scaling, crusting, and open sores may occur during treatment course. One or more than one of these methods may be used and may have to be used several times to control, suppress and eliminate the PreCancerous changes. Discussed treatment course, expected reaction, and possible side effects. - Recommend daily broad spectrum sunscreen SPF 30+ to sun-exposed areas, reapply every 2 hours as needed.  - Staying in the shade or wearing long  sleeves, sun glasses (UVA+UVB protection) and wide brim hats (4-inch brim around the entire circumference of the hat) are  also recommended. - Call for new or changing lesions.  - Start 5-fluorouracil/calcipotriene cream twice a day for 7 days to affected areas including face. Prescription sent to [facility]. Patient advised they will receive an email to purchase the medication online and have it sent to their home. Patient provided with handout reviewing treatment course and side effects and advised to call or message [place] on MyChart with any concerns.   Return in about 2 months (around 08/01/2021) for Psoriasis f/u, Actinic damage/ak f/u.  I, Othelia Pulling, RMA, am acting as scribe for Sarina Ser, MD . Documentation: I have reviewed the above documentation for accuracy and completeness, and I agree with the above.  Sarina Ser, MD

## 2021-06-01 ENCOUNTER — Encounter: Payer: Self-pay | Admitting: Dermatology

## 2021-06-08 ENCOUNTER — Encounter: Payer: BC Managed Care – PPO | Admitting: Plastic Surgery

## 2021-06-09 ENCOUNTER — Other Ambulatory Visit: Payer: Self-pay | Admitting: Plastic Surgery

## 2021-06-09 DIAGNOSIS — N62 Hypertrophy of breast: Secondary | ICD-10-CM | POA: Diagnosis not present

## 2021-06-09 DIAGNOSIS — M545 Low back pain, unspecified: Secondary | ICD-10-CM | POA: Diagnosis not present

## 2021-06-09 DIAGNOSIS — M546 Pain in thoracic spine: Secondary | ICD-10-CM | POA: Diagnosis not present

## 2021-06-09 DIAGNOSIS — M793 Panniculitis, unspecified: Secondary | ICD-10-CM | POA: Diagnosis not present

## 2021-06-09 DIAGNOSIS — M4004 Postural kyphosis, thoracic region: Secondary | ICD-10-CM | POA: Diagnosis not present

## 2021-06-09 HISTORY — PX: COSMETIC SURGERY: SHX468

## 2021-06-11 HISTORY — PX: REDUCTION MAMMAPLASTY: SUR839

## 2021-06-14 ENCOUNTER — Telehealth: Payer: Self-pay

## 2021-06-14 NOTE — Telephone Encounter (Signed)
Patient called to ask if she can loosen the thing around her chest because it feels like it's cutting her circulation off.  Please call.

## 2021-06-14 NOTE — Telephone Encounter (Signed)
Pt stated that she had SX this past Friday and it's the ace bandage that is too tight. Adv pt that she can loosen it but make sure she wraps it back snug in order to have the compression affect. Pt conveyed understanding.

## 2021-06-15 ENCOUNTER — Other Ambulatory Visit: Payer: Self-pay

## 2021-06-15 ENCOUNTER — Ambulatory Visit (INDEPENDENT_AMBULATORY_CARE_PROVIDER_SITE_OTHER): Payer: BC Managed Care – PPO | Admitting: Surgical

## 2021-06-15 DIAGNOSIS — M793 Panniculitis, unspecified: Secondary | ICD-10-CM

## 2021-06-15 DIAGNOSIS — N62 Hypertrophy of breast: Secondary | ICD-10-CM

## 2021-06-15 NOTE — Progress Notes (Addendum)
Patient is a 50 year old female here for follow-up after bilateral breast reduction and abdominoplasty with Dr. Claudia Desanctis on 06/02/2021.  She is approximately 2 weeks postop.  She presents today with her friend.  She reports that she is doing okay, having a lot of tenderness of her abdomen and bilateral breasts.  She reports that she has had some swelling of her lower extremities, but reports she has not been taking her normal "water pills".  She is not having any infectious symptoms.  She has been wearing compression garments.  Her JP drain is in place on the right side and has been outputting approximately 25 to 50 cc per 24 hours.  She is not having any fevers, chills, nausea, vomiting, chest pain or shortness of breath.  Chaperone present on exam Patient is well-developed, well-nourished, no acute distress Breathing is unlabored. On exam bilateral NAC's are viable, bilateral breast incisions are intact, no subcutaneous fluid collections are noted.  Some bruising noted of bilateral breasts.  On exam umbilicus is viable, umbilicus and abdominal incisions are intact.  She has some abdominal swelling and tightness, some bruising noted as well but overall this is as expected.  There is no skin necrosis noted.  No drainage or foul odors are noted Lower extremities with swelling.  Normal sensation and temperature intact distally.  No erythema.   Recommend restarting her HCTZ to help with swelling of her lower extremities, discussed with patient that swelling of lower extremities is fairly common after the procedure she had and to continue to monitor this.  We did discuss if she develops worsening symptoms or any other concerning symptoms to seek evaluation in the emergency room.  Recommend wearing compression socks and elevating legs as often as possible.  Recommend continue to ambulate approximately every 2 hours to decrease risk of DVT.  Patient is agreement and understanding of this.  Recommend continue to  wear compressive garment 24/7.  Avoid strenuous activities.  Follow-up in 1 week.  Right JP drain still in place, will plan to remove next week pending output. All of her questions were answered to her content.

## 2021-06-21 ENCOUNTER — Telehealth: Payer: Self-pay

## 2021-06-21 NOTE — Telephone Encounter (Signed)
Pt called office to inform us of a notice she received from [facility] that they needed to get in touch with her Drs office. I called [facility] and they stated to disregard letter and that nothing is needed at this time.

## 2021-06-23 ENCOUNTER — Other Ambulatory Visit: Payer: Self-pay

## 2021-06-23 ENCOUNTER — Ambulatory Visit (INDEPENDENT_AMBULATORY_CARE_PROVIDER_SITE_OTHER): Payer: BC Managed Care – PPO | Admitting: Physician Assistant

## 2021-06-23 ENCOUNTER — Telehealth: Payer: Self-pay

## 2021-06-23 DIAGNOSIS — Z9889 Other specified postprocedural states: Secondary | ICD-10-CM | POA: Insufficient documentation

## 2021-06-23 NOTE — Telephone Encounter (Signed)
Returned patients call. She as been bleeding since the drain was removed from her abdomen today. Advised to apply the opening with vaseline and change gauze or ABD pads as needed along with rest more over the weekend. Patient understood and agreed with plan.

## 2021-06-23 NOTE — Telephone Encounter (Signed)
Patient called to say that we took out her drain today and it's soaking through the bandage and her clothes and it's still coming out.  She isn't sure if this is a problem.  She told us that it's painful.  She isn't sure what to do.  Please call.

## 2021-06-23 NOTE — Progress Notes (Addendum)
Patient is a 50 year old female s/p bilateral breast reduction and abdominoplasty with Dr. Claudia Desanctis performed 06/02/2021.  She was seen in the office 06/15/2021 for her initial postop visit and was encouraged to continue wearing compressive garment at all times and to avoid any strenuous activities.  Plan for today's visit was removal of right-sided JP drain.  On examination, she endorses right lower quadrant abdominal discomfort at site of drain placement.  There is mild redness and tenderness, but no spreading induration, lymphangitis, significant purulent drainage, or other overt cellulitic changes.  She denies any fevers, chills, or other systemic symptoms at home.  She also states that her right breast appears to be asymmetric relative to her left breast, but was reassured that there is going to be settling to occur.  She also suspects that the Steri-Strips are playing a role in the asymmetry.  Chaperone present for my exam.  Incisions all appear to be intact.  No cellulitic changes.  Right-sided JP drain was removed without complication.  Plan is for Vaseline and 2 x 2 gauze at site where JP drain was removed today.  There were no overt cellulitic changes, low suspicion for infection at this time.  She does have a tube of mupirocin ointment at home which she can also apply if she is concerned, but informed her that it does not appear to be infected on today's encounter.  Recommended that she return to the clinic in 3 weeks.  She will remove all remaining Steri-Strips in approximately 1 week.  She inquired about scar cream, but discussed that this will come later.  No other complaints and all other questions answered.  Encouraged her to call the clinic if she develop any new questions or concerns.

## 2021-07-02 ENCOUNTER — Telehealth: Payer: Self-pay | Admitting: Plastic Surgery

## 2021-07-02 NOTE — Telephone Encounter (Signed)
The patient called with concerns about stretching out in the middle of the night.  She says that she is drinking a lot of water.  She is eating well.  Her bowels and her voiding seem to be normal.  Her pain is under control.  She does not see any fluid collections.  She does have some fluid retention in her lower extremities.  It improved a little bit when she started back her hydrochlorothiazide.  Recommended 2 to 400 mg of Motrin 2-3 times a day and getting some sugar-free Gatorade to help with electrolyte balance.  Also recommend she call her PCP first thing Monday for reevaluation of her hydrochlorothiazide and other medications.  She does have an appointment to see [place] next week and I encouraged her to keep that appointment.  Let us know if there is any other symptoms.  In the meantime she agrees with the plan and we remain available if needed.

## 2021-07-11 NOTE — Progress Notes (Signed)
Patient is a 50 year old female s/p bilateral breast reduction and abdominoplasty with Dr. Claudia Desanctis performed 06/02/2021 who presents to clinic for follow-up appointment.  Patient reports overall she is doing well in regards to healing, feels as if she is getting back to her normal self.  She does report that she has had a psoriasis flareup and is currently on some steroids p.o. for this.  She is not having any infectious symptoms.  She does report some tenderness to her right lateral breast incision/axilla.  She has some concerns about her breast symmetry, she feels as if the right nipple areolar complex is pointing to the right and the left is pointing forward.  She also feels as if they are different in size.  She is overall very happy with her abdominoplasty and reports her goal was to have a flat stomach and feels as if this goal has been reached.  Chaperone present on exam On exam bilateral breast incisions are intact, bilateral NAC's are viable.  No wounds are noted.  She does have some irritation within the right axilla, does not appear infected or any concern for cellulitic changes.  I do not palpate any subcutaneous fluid within either breast.  I do appreciate that the right nipple areolar complex is pointing in a more outward direction whereas the left nipple areolar complex is pointing anymore for direction.  Her umbilicus incision is intact and is viable.  Abdominal incision is intact and healing very nicely.  She has very good contour of her abdomen.  Recommend following up in a few weeks to reevaluate bilateral breasts.  Recommend continue to avoid strenuous activities.  We did discuss that use of steroids may delay her healing slightly.  No strenuous activities.  Avoid any abdominal exercises for at least 8 weeks after surgery.  Recommend calling with questions or concerns.  Pictures were taken and placed in the patient's chart with patient's permission.

## 2021-07-13 ENCOUNTER — Telehealth: Payer: Self-pay

## 2021-07-13 MED ORDER — PREDNISONE 20 MG PO TABS: 20.0000 mg | ORAL_TABLET | ORAL | 0 refills | Status: DC

## 2021-07-13 NOTE — Telephone Encounter (Signed)
Confirmed patient taking Otezla. Prednisone RX sent in and patient advised of directions.

## 2021-07-13 NOTE — Telephone Encounter (Signed)
Patient called regarding current Psoriasis flare. She is going through a lot of stress right now and the areas are not being controlled with medications. She is unable to sleep or tolerate pain. She has asked if you would send in a small prednisone taper to help get her through the holiday weekend.

## 2021-07-14 ENCOUNTER — Encounter: Payer: Self-pay | Admitting: Physician Assistant

## 2021-07-14 ENCOUNTER — Other Ambulatory Visit: Payer: Self-pay

## 2021-07-14 ENCOUNTER — Ambulatory Visit (INDEPENDENT_AMBULATORY_CARE_PROVIDER_SITE_OTHER): Payer: BC Managed Care – PPO | Admitting: Surgical

## 2021-07-14 DIAGNOSIS — Z9889 Other specified postprocedural states: Secondary | ICD-10-CM

## 2021-07-27 ENCOUNTER — Other Ambulatory Visit: Payer: Self-pay

## 2021-07-27 ENCOUNTER — Ambulatory Visit (INDEPENDENT_AMBULATORY_CARE_PROVIDER_SITE_OTHER): Payer: BC Managed Care – PPO | Admitting: Plastic Surgery

## 2021-07-27 ENCOUNTER — Encounter: Payer: Self-pay | Admitting: Plastic Surgery

## 2021-07-27 DIAGNOSIS — Z411 Encounter for cosmetic surgery: Secondary | ICD-10-CM

## 2021-07-27 DIAGNOSIS — N62 Hypertrophy of breast: Secondary | ICD-10-CM

## 2021-07-27 NOTE — Progress Notes (Signed)
6-weekPatient presents postop from abdominoplasty combined with bilateral breast reduction.  Overall she seems to be coming along okay.  She does feel like her left breast is a little bit bigger.  She also feels like the right nipple areolar complex is directed more laterally compared to the left side.  On exam all of her incisions look to be healing fine.  Her abdomen has a great contour.  Regarding her breast to my eye the volume on each side looks to be symmetric.  There is some angling of the nipple areolar complex on the right side and a more lateral vector.  Sternal notch to nipple distance is exactly 27 cm on both sides.  Remainder the incisions are intact with no healing issues.  I had a long discussion with the patient about how to proceed.  From the standpoint of the volume I do not believe that a revision related to the volume will be necessary.  It is likely that some additional settling out will take place in I do not see enough of a volume discrepancy at this point that would be worth pursuing.  Regarding the nipple areolar complex there is some lateral displacement of the nipple on the right side.  This was present preoperatively and was discussed ahead of time.  We also discussed how when the breasts started off fairly asymmetric which was the case for her it can be hard to achieve perfect symmetry.  I do think that the orientation of the nipple on the right side could be corrected with a crescent excision which would directed to the vector more anteriorly.  I will plan to see her 6 weeks and we can revisit this.  All of her questions were answered.

## 2021-07-31 ENCOUNTER — Encounter: Payer: Self-pay | Admitting: Dermatology

## 2021-07-31 ENCOUNTER — Other Ambulatory Visit: Payer: Self-pay

## 2021-07-31 ENCOUNTER — Ambulatory Visit (INDEPENDENT_AMBULATORY_CARE_PROVIDER_SITE_OTHER): Payer: BC Managed Care – PPO | Admitting: Dermatology

## 2021-07-31 DIAGNOSIS — L4050 Arthropathic psoriasis, unspecified: Secondary | ICD-10-CM | POA: Diagnosis not present

## 2021-07-31 DIAGNOSIS — L2089 Other atopic dermatitis: Secondary | ICD-10-CM

## 2021-07-31 DIAGNOSIS — L409 Psoriasis, unspecified: Secondary | ICD-10-CM

## 2021-07-31 DIAGNOSIS — L578 Other skin changes due to chronic exposure to nonionizing radiation: Secondary | ICD-10-CM

## 2021-07-31 MED ORDER — EUCRISA 2 % EX OINT: 1.0000 "application " | TOPICAL_OINTMENT | Freq: Two times a day (BID) | CUTANEOUS | 6 refills | Status: DC

## 2021-07-31 MED ORDER — WYNZORA 0.005-0.064 % EX CREA: 1.0000 "application " | TOPICAL_CREAM | Freq: Two times a day (BID) | CUTANEOUS | 6 refills | Status: DC

## 2021-07-31 NOTE — Patient Instructions (Signed)

## 2021-07-31 NOTE — Progress Notes (Signed)
   Follow-Up Visit   Subjective  Kristine [name] is a 50 y.o. female who presents for the following: Actinic Keratosis (Follow up of face - treated with 5FU/Calcipotriene cream but she did not have any reaction) and Psoriasis (Follow up - seems to have flared since she had surgery. She is taking Otezla 30 mg bid but she has run out of [place]).  The following portions of the chart were reviewed this encounter and updated as appropriate:   Tobacco  Allergies  Meds  Problems  Med Hx  Surg Hx  Fam Hx     Review of Systems:  No other skin or systemic complaints except as noted in HPI or Assessment and Plan.  Objective  Well appearing patient in no apparent distress; mood and affect are within normal limits.  A focused examination was performed including face, arms, back. Relevant physical exam findings are noted in the Assessment and Plan.  Pink patches of upper back and post shoulders   Assessment & Plan   Psoriasis of skin  - flared with Arthritis (+/- Psoriatic Arthritis?)  Flared secondary to recent stress and recent surgery  -  Recommend evaluation by a rheumatologist to determine if psoriatic arthritis vs osteo arthritis vs rheumatoid arthritis  Psoriasis is a chronic non-curable, but treatable genetic/hereditary disease that may have other systemic features affecting other organ systems such as joints (Psoriatic Arthritis). It is associated with an increased risk of inflammatory bowel disease, heart disease, non-alcoholic fatty liver disease, and depression.    Continue Otezla 30 mg bid until evaluation with rheumatologist. May consider biologic treatment in the future.  Continue Wynzora cream bid  Calcipotriene-Betameth Diprop (WYNZORA) 0.005-0.064 % CREA Apply 1 application topically in the morning and at bedtime.  Ambulatory referral to Rheumatology  Atopic dermatitis with History of allergies - She is currently on antihistimines - Xyzal, Zyrtec, Singulair and  Azelastin nasal spray  Atopic dermatitis (eczema) is a chronic, relapsing, pruritic condition that can significantly affect quality of life. It is often associated with allergic rhinitis and/or asthma and can require treatment with topical medications, phototherapy, or in severe cases a biologic medication called Dupixent in children and adults.    Start Eucrisa oint bid May consider Opzelura cream in the future  Crisaborole (EUCRISA) 2 % OINT Apply 1 application topically in the morning and at bedtime.  Actinic Damage - chronic, secondary to cumulative UV radiation exposure/sun exposure over time - diffuse scaly erythematous macules with underlying dyspigmentation - Recommend daily broad spectrum sunscreen SPF 30+ to sun-exposed areas, reapply every 2 hours as needed.  - Recommend staying in the shade or wearing long sleeves, sun glasses (UVA+UVB protection) and wide brim hats (4-inch brim around the entire circumference of the hat). - Call for new or changing lesions.  Return in about 3 months (around 10/30/2021).  I, Ashok Cordia, CMA, am acting as scribe for Sarina Ser, MD . Documentation: I have reviewed the above documentation for accuracy and completeness, and I agree with the above.  Sarina Ser, MD

## 2021-08-08 ENCOUNTER — Other Ambulatory Visit: Payer: Self-pay | Admitting: Physical Medicine & Rehabilitation

## 2021-08-08 DIAGNOSIS — M542 Cervicalgia: Secondary | ICD-10-CM

## 2021-08-14 ENCOUNTER — Other Ambulatory Visit: Payer: Self-pay | Admitting: Surgical

## 2021-08-18 ENCOUNTER — Ambulatory Visit
Admission: RE | Admit: 2021-08-18 | Discharge: 2021-08-18 | Disposition: A | Discharge status: Home / Self Care | Payer: BC Managed Care – PPO | Source: Ambulatory Visit | Attending: Physical Medicine & Rehabilitation | Admitting: Physical Medicine & Rehabilitation

## 2021-08-18 ENCOUNTER — Other Ambulatory Visit: Payer: Self-pay

## 2021-08-18 DIAGNOSIS — M542 Cervicalgia: Secondary | ICD-10-CM | POA: Diagnosis present

## 2021-08-31 ENCOUNTER — Encounter: Payer: Self-pay | Admitting: Plastic Surgery

## 2021-08-31 ENCOUNTER — Other Ambulatory Visit: Payer: Self-pay

## 2021-08-31 ENCOUNTER — Ambulatory Visit (INDEPENDENT_AMBULATORY_CARE_PROVIDER_SITE_OTHER): Payer: BC Managed Care – PPO | Admitting: Plastic Surgery

## 2021-08-31 DIAGNOSIS — N62 Hypertrophy of breast: Secondary | ICD-10-CM

## 2021-08-31 NOTE — Progress Notes (Signed)
   Referring Provider Gladstone Lighter, MD [place],  [place] [address]   CC:  Chief Complaint  Patient presents with   Follow-up      Kristine [name] is an 50 y.o. female.  HPI: Patient presents 3 months out from bilateral breast reduction abdominoplasty.  She has been concerned about there being more volume on the left side compared to the right.  She feels like the nipple position asymmetry has improved as the skin is softened.  She is interested in correcting the volume asymmetry primarily.  She feels like her left breast looks bigger in close.  We reviewed her operative notes showing 458 g removed from the right and 456 g removed from the left.  Review of Systems General: Denies fevers and chills  Physical Exam Vitals with BMI 05/24/2021 05/24/2021 05/24/2021  Height - - 5\' 2"   Weight - - 162 lbs 15 oz  BMI - - 42.68  Systolic 341 962 -  Diastolic 68 66 -  Pulse 78 82 -  Some encounter information is confidential and restricted. Go to Review Flowsheets activity to see all data.    General:  No acute distress,  Alert and oriented, Non-Toxic, Normal speech and affect Breast: All of her scars are healing nicely.  She generally appears to have a nice shape.  Sternal notch to nipple distance is 27 cm bilaterally.  Nipple position is at the most projecting part of the breast on both sides.  General nipple position relative to the breast mound may be slightly more lateral on the right compared to the left.  General volume evaluation looks fairly symmetric.  She may have slightly more tissue in the inferolateral area of the left breast. Abdomen: Abdominal contour is good.  Scar is healing nicely.  She points out a slightly higher endpoint of the scar on the right side compared to the left.  There are no dogears.  Assessment/Plan I had a long discussion with Kristine [name] about her options.  Ultimately I think her result overall is good.  I did point out that she had  asymmetries both in her breast and abdomen on her preoperative photographs and she is in agreement with that.  I do believe that there is slightly more volume on the left side compared to the right.  I do not feel that the difference is very much.  Possibly 20 to 30 g difference in overall volume.  I believe that the nipple position is close enough that any modifications might result in further asymmetries elsewhere.  I did offer to do a small revision on the left side to remove a little bit of excess tissue in the inferior lateral quadrant of the left breast.  She seemed interested in this and we will try to arrange it for her.  I did describe the risk to her that would include bleeding, infection, damage to surrounding structures need for additional procedures.  I did explain that the breast would never be completely symmetric but that this would likely make it closer.  She is understanding and wants to move forward.  Kristine [name] 08/31/2021, 2:07 PM

## 2021-09-08 ENCOUNTER — Telehealth (INDEPENDENT_AMBULATORY_CARE_PROVIDER_SITE_OTHER): Payer: BC Managed Care – PPO | Admitting: Plastic Surgery

## 2021-09-08 ENCOUNTER — Encounter: Payer: Self-pay | Admitting: Plastic Surgery

## 2021-09-08 DIAGNOSIS — N62 Hypertrophy of breast: Secondary | ICD-10-CM

## 2021-09-08 NOTE — Progress Notes (Signed)
   Subjective:    Patient ID: Kristine [name], female    DOB: 02-05-1971, 50 y.o.   MRN: [id number]  The patient is a 50 yrs old female joining me by televisit for a discussion.  She underwent bilateral breast reductions in July with Dr. Claudia Desanctis.  She has done well in her postop follow-up as far as healing.  She has some concerns about asymmetry.  She understands that the breasts are going to be slightly different but does not want there to be as much difference as she feels she currently has between the left and the right.  She feels the left breast still tries to come out of the bra.  She also does not like the difference in the height of the lateral incision from left to right.  I think her improvement from preop is significant.  I think that there may be some things possible to help her to feel better.  She admits she has a body image challenge.  This is understandable.   Review of Systems  Constitutional: Negative.   Eyes: Negative.   Respiratory: Negative.    Cardiovascular: Negative.   Gastrointestinal: Negative.   Endocrine: Negative.   Genitourinary: Negative.       Objective:   Physical Exam     Assessment & Plan:  I connected with  Kristine [name] on 09/08/21 by a video enabled telemedicine application and verified that I am speaking with the correct person using two identifiers. The patient was at work and I was at the office.  We spent 20 min in the following manner: review of chart, discussion of the last 5 months events, options for revision and documentation.   I discussed the limitations of evaluation and management by telemedicine. The patient expressed understanding and agreed to proceed.  I think a revision with the scars to bring them lower may be helpful and resection of a little bit more tissue on the left breast laterally may help to keep the bra in place better.  Optional revision of right areola location.  I will talk with Colletta [place] and see what is possible.  The  patient has a high anxiety to needles so it may not be possible to do in the office.

## 2021-10-09 ENCOUNTER — Other Ambulatory Visit: Payer: Self-pay | Admitting: Dermatology

## 2021-10-10 ENCOUNTER — Telehealth: Payer: Self-pay | Admitting: Plastic Surgery

## 2021-10-10 NOTE — Telephone Encounter (Signed)
Called patient to advise her that Dr. Claudia Desanctis said the area to excise is too large to be done in the office under local and it would be best suited for the patient to have the surgery under anesthesia. Patient will think about whether she wants to move forward with revision procedure.

## 2021-10-11 ENCOUNTER — Other Ambulatory Visit: Payer: Self-pay

## 2021-10-11 ENCOUNTER — Ambulatory Visit
Payer: BC Managed Care – PPO | Attending: Student in an Organized Health Care Education/Training Program | Admitting: Student in an Organized Health Care Education/Training Program

## 2021-10-11 ENCOUNTER — Encounter: Payer: Self-pay | Admitting: Student in an Organized Health Care Education/Training Program

## 2021-10-11 VITALS — BP 110/59 | HR 78 | Temp 97.2°F | Resp 16 | Ht 62.0 in | Wt 155.0 lb

## 2021-10-11 DIAGNOSIS — M5412 Radiculopathy, cervical region: Secondary | ICD-10-CM | POA: Insufficient documentation

## 2021-10-11 DIAGNOSIS — M47812 Spondylosis without myelopathy or radiculopathy, cervical region: Secondary | ICD-10-CM | POA: Insufficient documentation

## 2021-10-11 DIAGNOSIS — G894 Chronic pain syndrome: Secondary | ICD-10-CM | POA: Insufficient documentation

## 2021-10-11 DIAGNOSIS — M4802 Spinal stenosis, cervical region: Secondary | ICD-10-CM | POA: Insufficient documentation

## 2021-10-11 NOTE — Patient Instructions (Signed)
______________________________________________________________________  Preparing for Procedure with Sedation  NOTICE: Due to recent regulatory changes, starting on June 12, 2021, procedures requiring intravenous (IV) sedation will no longer be performed at the [facility].  These types of procedures are required to be performed at [facility] ambulatory surgery facility.  We are very sorry for the inconvenience.  Procedure appointments are limited to planned procedures: No Prescription Refills. No disability issues will be discussed. No medication changes will be discussed.  Instructions: Oral Intake: Do not eat or drink anything for at least 8 hours prior to your procedure. (Exception: Blood Pressure Medication. See below.) Transportation: A driver is required. You may not drive yourself after the procedure. Blood Pressure Medicine: Do not forget to take your blood pressure medicine with a sip of water the morning of the procedure. If your Diastolic (lower reading) is above 100 mmHg, elective cases will be cancelled/rescheduled. Blood thinners: These will need to be stopped for procedures. Notify our staff if you are taking any blood thinners. Depending on which one you take, there will be specific instructions on how and when to stop it. Diabetics on insulin: Notify the staff so that you can be scheduled 1st case in the morning. If your diabetes requires high dose insulin, take only  of your normal insulin dose the morning of the procedure and notify the staff that you have done so. Preventing infections: Shower with an antibacterial soap the morning of your procedure. Build-up your immune system: Take 1000 mg of Vitamin C with every meal (3 times a day) the day prior to your procedure. Antibiotics: Inform the staff if you have a condition or reason that requires you to take antibiotics before dental procedures. Pregnancy: If you are pregnant, call and cancel the procedure. Sickness: If  you have a cold, fever, or any active infections, call and cancel the procedure. Arrival: You must be in the facility at least 30 minutes prior to your scheduled procedure. Children: Do not bring children with you. Dress appropriately: Bring dark clothing that you would not mind if they get stained. Valuables: Do not bring any jewelry or valuables.  Reasons to call and reschedule or cancel your procedure: (Following these recommendations will minimize the risk of a serious complication.) Surgeries: Avoid having procedures within 2 weeks of any surgery. (Avoid for 2 weeks before or after any surgery). Flu Shots: Avoid having procedures within 2 weeks of a flu shots. (Avoid for 2 weeks before or after immunizations). Barium: Avoid having a procedure within 7-10 days after having had a radiological study involving the use of radiological contrast. (Myelograms, Barium swallow or enema study). Heart attacks: Avoid any elective procedures or surgeries for the initial 6 months after a "Myocardial Infarction" (Heart Attack). Blood thinners: It is imperative that you stop these medications before procedures. Let us know if you if you take any blood thinner.  Infection: Avoid procedures during or within two weeks of an infection (including chest colds or gastrointestinal problems). Symptoms associated with infections include: Localized redness, fever, chills, night sweats or profuse sweating, burning sensation when voiding, cough, congestion, stuffiness, runny nose, sore throat, diarrhea, nausea, vomiting, cold or Flu symptoms, recent or current infections. It is specially important if the infection is over the area that we intend to treat. Heart and lung problems: Symptoms that may suggest an active cardiopulmonary problem include: cough, chest pain, breathing difficulties or shortness of breath, dizziness, ankle swelling, uncontrolled high or unusually low blood pressure, and/or palpitations. If you are  experiencing any of these symptoms, cancel your procedure and contact your primary care physician for an evaluation.  Remember:  Regular Business hours are:  Monday to Thursday 8:00 AM to 4:00 PM  Provider's Schedule: Milinda Pointer, MD:  Procedure days: Tuesday and Thursday 7:30 AM to 4:00 PM  Gillis Santa, MD:  Procedure days: Monday and Wednesday 7:30 AM to 4:00 PM ______________________________________________________________________  Epidural Steroid Injection Patient Information  Description: The epidural space surrounds the nerves as they exit the spinal cord.  In some patients, the nerves can be compressed and inflamed by a bulging disc or a tight spinal canal (spinal stenosis).  By injecting steroids into the epidural space, we can bring irritated nerves into direct contact with a potentially helpful medication.  These steroids act directly on the irritated nerves and can reduce swelling and inflammation which often leads to decreased pain.  Epidural steroids may be injected anywhere along the spine and from the neck to the low back depending upon the location of your pain.   After numbing the skin with local anesthetic (like Novocaine), a small needle is passed into the epidural space slowly.  You may experience a sensation of pressure while this is being done.  The entire block usually last less than 10 minutes.  Conditions which may be treated by epidural steroids:  Low back and leg pain Neck and arm pain Spinal stenosis Post-laminectomy syndrome Herpes zoster (shingles) pain Pain from compression fractures  Preparation for the injection:  Do not eat any solid food or dairy products within 8 hours of your appointment.  You may drink clear liquids up to 3 hours before appointment.  Clear liquids include water, black coffee, juice or soda.  No milk or cream please. You may take your regular medication, including pain medications, with a sip of water before your  appointment  Diabetics should hold regular insulin (if taken separately) and take 1/2 normal NPH dos the morning of the procedure.  Carry some sugar containing items with you to your appointment. A driver must accompany you and be prepared to drive you home after your procedure.  Bring all your current medications with your. An IV may be inserted and sedation may be given at the discretion of the physician.   A blood pressure cuff, EKG and other monitors will often be applied during the procedure.  Some patients may need to have extra oxygen administered for a short period. You will be asked to provide medical information, including your allergies, prior to the procedure.  We must know immediately if you are taking blood thinners (like Coumadin/Warfarin)  Or if you are allergic to IV iodine contrast (dye). We must know if you could possible be pregnant.  Possible side-effects: Bleeding from needle site Infection (rare, may require surgery) Nerve injury (rare) Numbness & tingling (temporary) Difficulty urinating (rare, temporary) Spinal headache ( a headache worse with upright posture) Light -headedness (temporary) Pain at injection site (several days) Decreased blood pressure (temporary) Weakness in arm/leg (temporary) Pressure sensation in back/neck (temporary)  Call if you experience: Fever/chills associated with headache or increased back/neck pain. Headache worsened by an upright position. New onset weakness or numbness of an extremity below the injection site Hives or difficulty breathing (go to the emergency room) Inflammation or drainage at the infection site Severe back/neck pain Any new symptoms which are concerning to you  Please note:  Although the local anesthetic injected can often make your back or neck feel good for several hours after  the injection, the pain will likely return.  It takes 3-7 days for steroids to work in the epidural space.  You may not notice any pain  relief for at least that one week.  If effective, we will often do a series of three injections spaced 3-6 weeks apart to maximally decrease your pain.  After the initial series, we generally will wait several months before considering a repeat injection of the same type.  If you have any questions, please call [phone] [facility]

## 2021-10-11 NOTE — Progress Notes (Signed)
Safety precautions to be maintained throughout the outpatient stay will include: orient to surroundings, keep bed in low position, maintain call bell within reach at all times, provide assistance with transfer out of bed and ambulation.  

## 2021-10-11 NOTE — Progress Notes (Signed)
Patient: Kristine [name]  Service Category: E/M  Provider: Gillis Santa, MD  DOB: Jan 21, 1971  DOS: 10/11/2021  Referring Provider: Doyle Askew, MD  MRN: [id number]  Setting: Ambulatory outpatient  PCP: Gladstone Lighter, MD  Type: New Patient  Specialty: Interventional Pain Management    Location: Office  Delivery: Face-to-face     Primary Reason(s) for Visit: Encounter for initial evaluation of one or more chronic problems (new to examiner) potentially causing chronic pain, and posing a threat to normal musculoskeletal function. (Level of risk: High) CC: Neck Pain (Bilateral )  HPI  Kristine [name] is a 50 y.o. year old, female patient, who comes for the first time to our practice referred by Girtha Hake I, MD for our initial evaluation of her chronic pain. She has Depression; Allergic rhinitis; Heavy drinker; Irregular bleeding; Migraine headache; Excess weight; Frequent UTI; Infection of urinary tract; Allergic urticaria due to ingested food; VSD (ventricular septal defect and aortic arch hypoplasia; Abnormal weight gain; Postoperative state; Chronic idiopathic urticaria; Menorrhagia with irregular cycle; On prednisone therapy; Calcaneal spur; Elevated TSH; Chest pain; Cervical cancer ([place]); Bilateral carpal tunnel syndrome; Intractable migraine with aura without status migrainosus; Polyneuropathy; GAD (generalized anxiety disorder); Insomnia due to medical condition; Needle phobia; Basal cell carcinoma, eyelid, left; Sinusitis accessory; Plantar fasciitis; Disequilibrium; Chronic otitis externa of both ears; Basal cell carcinoma (BCC) in situ of skin; S/P bilateral breast reduction; Cervical radicular pain (R>L); Spinal stenosis in cervical region; and Cervical spondylosis on their problem list. Today she comes in for evaluation of her Neck Pain (Bilateral )  Pain Assessment: Location: Left, Right Neck Radiating: neck pain causing headaches. Onset: More than a month ago Duration: Chronic  pain Quality: Discomfort, Constant, Headache (feels like her shoulders are "hunched" time.) Severity: 5 /10 (subjective, self-reported pain score)  Effect on ADL: limited ROM Timing: Constant Modifying factors: botox injections, chiropractor, dry needling, massage BP: (!) 110/59  HR: 78  Onset and Duration: Gradual Cause of pain: Unknown Severity: No change since onset, NAS-11 at its worse: 10/10, NAS-11 at its best: 5/10, NAS-11 now: 5/10, and NAS-11 on the average: 5/10 Timing: Not influenced by the time of the day, During activity or exercise, After activity or exercise, and After a period of immobility Aggravating Factors: Bending, Climbing, Intercourse (sex), Kneeling, Lifiting, Motion, Prolonged sitting, Prolonged standing, Squatting, Stooping , Twisting, and Working Alleviating Factors: Biofeedback, Stretching, Cold packs, Hot packs, Resting, Sleeping, TENS, Relaxation therapy, Chiropractic manipulations, and Walking Associated Problems: Day-time cramps, Night-time cramps, Fatigue, Inability to concentrate, Numbness, Spasms, Swelling, Tingling, Weakness, Pain that wakes patient up, and Pain that does not allow patient to sleep Quality of Pain: Aching, Agonizing, Annoying, Burning, Constant, Intermittent, Cramping, Cruel, Deep, Disabling, Distressing, Dreadful, Dull, Exhausting, Fearful, Feeling of constriction, Feeling of weight, Getting longer, Getting shorter, Heavy, Horrible, Hot, Itching, Lancinating, Nagging, Numb, Pressure-like, Pulsating, Punishing, Sharp, Shooting, Sickening, Splitting, Stabbing, Superficial, Tender, Throbbing, Tingling, Tiring, Toothache-like, Uncomfortable, and Work related Previous Examinations or Tests: CT scan, MRI scan, X-rays, Nerve conduction test, Neurological evaluation, Neurosurgical evaluation, Orthopedic evaluation, Chiropractic evaluation, and Psychiatric evaluation Previous Treatments: Biofeedback, Chiropractic manipulations, Physical Therapy, Pool  exercises, Radiofrequency, Relaxation therapy, Steroid treatments by mouth, Strengthening exercises, Stretching exercises, TENS, Traction, and Trigger point injections  Kristine [name] is a pleasant 50 year old female being referred from Dr. Alba Destine with physical medicine and rehab for a fast-track cervical epidural steroid injection to be done under sedation.  Of note Kristine [name] has a history of chronic neck pain, no inciting or traumatic events  that radiates into bilateral shoulders and down to bilateral forearms and hands with her right side being worse.  She also has difficulty with fine motor control of her right hand.  She endorses numbness and tingling of her right fingers.  She has a C5, C6 disc herniation that is causing central canal and neuroforaminal stenosis.  She has tried physical therapy in the past, chiropractic therapy, pool therapy, various oral medications.  She does have a history of multiple allergies.  Meds   Current Outpatient Medications:    albuterol (PROVENTIL HFA;VENTOLIN HFA) 108 (90 Base) MCG/ACT inhaler, Inhale 2 puffs into the lungs every 6 (six) hours as needed for wheezing or shortness of breath., Disp: 1 Inhaler, Rfl: 2   ALPRAZolam (XANAX) 0.5 MG tablet, Take 1 tablet (0.5 mg total) by mouth as directed. Take it only once a day as needed for severe panic symptoms, Disp: 10 tablet, Rfl: 0   ATROVENT HFA 17 MCG/ACT inhaler, , Disp: , Rfl:    azelastine (ASTELIN) 0.1 % nasal spray, , Disp: , Rfl:    Biotin 10000 MCG TABS, Take 10,000 mg by mouth daily., Disp: , Rfl:    Calcipotriene-Betameth Diprop (WYNZORA) 0.005-0.064 % CREA, Apply 1 application topically in the morning and at bedtime., Disp: 60 g, Rfl: 6   cetirizine (ZYRTEC) 10 MG tablet, Take 2 tablets by mouth 2 (two) times daily. , Disp: , Rfl:    COSENTYX SENSOREADY PEN 150 MG/ML SOAJ, Inject 150 mg into the skin once a week. Once weekly x 3 weeks, skip week, then monthly, Disp: , Rfl:    CRANBERRY PO, Take 1 capsule by  mouth at bedtime. , Disp: , Rfl:    desvenlafaxine (PRISTIQ) 100 MG 24 hr tablet, Take 1 tablet (100 mg total) by mouth daily., Disp: 90 tablet, Rfl: 0   dibucaine (NUPERCAINAL) 1 % OINT, Place 1 application rectally as needed for hemorrhoids., Disp: 28 g, Rfl: 0   EPINEPHrine 0.3 mg/0.3 mL IJ SOAJ injection, Inject 0.3 mLs (0.3 mg total) into the muscle once., Disp: 1 Device, Rfl: 1   fexofenadine (ALLEGRA) 180 MG tablet, Take 180 mg by mouth as needed. Allegra D, Disp: , Rfl:    hydrochlorothiazide (HYDRODIURIL) 25 MG tablet, Take 25 mg by mouth daily., Disp: , Rfl:    levocetirizine (XYZAL) 5 MG tablet, Take 2 tablets by mouth twice daily, Disp: , Rfl:    levothyroxine (SYNTHROID, LEVOTHROID) 50 MCG tablet, Take 50 mcg daily, any generic manufacturer is okay but try to stay with same manufacturer as much as possible, Disp: , Rfl:    montelukast (SINGULAIR) 10 MG tablet, Take 10 mg by mouth at bedtime. , Disp: , Rfl:    nitrofurantoin, macrocrystal-monohydrate, (MACROBID) 100 MG capsule, Take by mouth., Disp: , Rfl:    omalizumab (XOLAIR) 150 MG injection, INJECT 300MG SUBCUTANEOUSLY EVERY 4 WEEKS (GIVEN AT PRESCRIBERS OFFICE), Disp: , Rfl:    ondansetron (ZOFRAN) 4 MG tablet, Take 1 tablet (4 mg total) by mouth every 8 (eight) hours as needed for nausea or vomiting., Disp: 20 tablet, Rfl: 0   OTEZLA 30 MG TABS, TAKE 1 TABLET BY MOUTH 2 TIMES A DAY., Disp: 60 tablet, Rfl: 1   polyethylene glycol (MIRALAX) 17 g packet, Take 17 g by mouth daily., Disp: 14 each, Rfl: 0   potassium chloride (KLOR-CON) 10 MEQ tablet, potassium chloride ER 10 mEq tablet,extended release, Disp: , Rfl:    RELION PEN NEEDLES 29G X 12MM MISC, USE 1 PEN NEEDLE  ONCE DAILY AS DIRECTED, Disp: , Rfl:    SPIRIVA RESPIMAT 1.25 MCG/ACT AERS, SMARTSIG:2 Puff(s) Via Inhaler Every Morning, Disp: , Rfl:    vitamin B-12 (CYANOCOBALAMIN) 1000 MCG tablet, Take 1,000 mcg by mouth daily., Disp: , Rfl:    vitamin C (ASCORBIC ACID) 500 MG  tablet, Take 500 mg by mouth at bedtime., Disp: , Rfl:    azelastine (OPTIVAR) 0.05 % ophthalmic solution, Place 1 drop into both eyes daily. (Patient not taking: Reported on 10/11/2021), Disp: , Rfl:    Azelastine HCl 0.15 % SOLN, Place 2 sprays into the nose 2 (two) times daily.  (Patient not taking: Reported on 10/11/2021), Disp: , Rfl:    Botulinum Toxin Type A (BOTOX) 200 units SOLR, INJECT 200 UNITS  INTRAMUSCULARLY EVERY 3  MONTHS (GIVEN AT MD OFFICE, DISCARD UNUSED) (Patient not taking: Reported on 10/11/2021), Disp: , Rfl:    budesonide (PULMICORT) 1 MG/2ML nebulizer solution, Inhale into the lungs., Disp: , Rfl:    clobetasol ointment (TEMOVATE) 0.05 %, APPLY TO AFFECTED AREA TWICE A DAY (Patient not taking: Reported on 10/11/2021), Disp: , Rfl:    Crisaborole (EUCRISA) 2 % OINT, Apply 1 application topically in the morning and at bedtime. (Patient not taking: Reported on 10/11/2021), Disp: 60 g, Rfl: 6   cyanocobalamin (,VITAMIN B-12,) 1000 MCG/ML injection, INJECT ONE ML INTO THE MUSCLE ONCE WEEKLY FOR FOUR WEEKS. THEN INJECT 1ML INTO THE MUSCLE ONCE MONTHY FOR FOUR MONTHS (Patient not taking: Reported on 10/11/2021), Disp: , Rfl:    fluticasone (FLONASE) 50 MCG/ACT nasal spray, Place 1 spray into both nostrils 2 (two) times daily.  (Patient not taking: Reported on 10/11/2021), Disp: , Rfl:    fluticasone (FLOVENT HFA) 220 MCG/ACT inhaler, INHALE 2 PUFFS BY MOUTH TWICE DAILY (Patient not taking: Reported on 10/11/2021), Disp: , Rfl:    Fluticasone-Salmeterol (ADVAIR) 250-50 MCG/DOSE AEPB, Inhale 1 puff into the lungs 2 (two) times daily. (Patient not taking: Reported on 10/11/2021), Disp: , Rfl:    Fremanezumab-vfrm (AJOVY) 225 MG/1.5ML SOAJ, , Disp: , Rfl:    ipratropium (ATROVENT HFA) 17 MCG/ACT inhaler, Inhale into the lungs., Disp: , Rfl:    ipratropium (ATROVENT) 0.03 % nasal spray, Place into both nostrils. (Patient not taking: Reported on 10/11/2021), Disp: , Rfl:    magnesium oxide  (MAG-OX) 400 MG tablet, Take 400 mg by mouth daily. (Patient not taking: Reported on 10/11/2021), Disp: , Rfl:    mometasone (ELOCON) 0.1 % ointment, Apply topically. (Patient not taking: Reported on 10/11/2021), Disp: , Rfl:    Multiple Vitamins-Minerals (ZINC PO), Take 1 tablet by mouth daily. (Patient not taking: Reported on 10/11/2021), Disp: , Rfl:    Pediatric Multiple Vit-C-FA (CHEWABLE VITE CHILDRENS) CHEW, Chew by mouth. (Patient not taking: Reported on 10/11/2021), Disp: , Rfl:    predniSONE (DELTASONE) 20 MG tablet, Take 1 tablet (20 mg total) by mouth as directed. Take 1 po qd for 3 days then take 1 po qod for two doses (Patient not taking: Reported on 10/11/2021), Disp: 5 tablet, Rfl: 0   triamcinolone cream (KENALOG) 0.1 %, Apply topically. (Patient not taking: Reported on 10/11/2021), Disp: , Rfl:   Imaging Review  Cervical Imaging: Cervical MR wo contrast: Results for orders placed during the hospital encounter of 08/18/21  MR CERVICAL SPINE WO CONTRAST  Narrative CLINICAL DATA:  Cervicalgia. Additional history provided: Patient reports chronic neck pain for greater than 10 years, no known injury. Associated occipital headaches.  EXAM: MRI CERVICAL SPINE WITHOUT CONTRAST  TECHNIQUE: Multiplanar, multisequence MR imaging of the cervical spine was performed. No intravenous contrast was administered.  COMPARISON:  No pertinent prior exams available for comparison.  FINDINGS: Alignment: Straightening of the expected cervical lordosis. Mild cervical levocurvature, possibly positional. No significant spondylolisthesis.  Vertebrae: Vertebral body height is maintained. Trace degenerative endplate edema at U2-G2.  Cord: No spinal cord signal abnormality is identified. Spinal cord flattening at C5-C6, as described below.  Posterior Fossa, vertebral arteries, paraspinal tissues: No abnormality identified within included portions of the posterior fossa. Flow voids  preserved within the imaged cervical vertebral arteries. Paraspinal soft tissues unremarkable.  Disc levels:  Mild-to-moderate disc degeneration at C5-C6. No more than mild disc degeneration at the remaining levels.  C2-C3: Mild facet arthrosis on the right. No significant disc herniation or stenosis.  C3-C4: Uncovertebral hypertrophy on the right. Mild facet arthrosis. No significant spinal canal stenosis. Mild right neural foraminal narrowing.  C4-C5: Shallow disc bulge. Minimal bilateral uncovertebral hypertrophy. No significant spinal canal or foraminal stenosis.  C5-C6: Posterior disc osteophyte complex with bilateral disc osteophyte ridge/uncinate hypertrophy. Severe spinal canal stenosis with moderate spinal cord flattening. No appreciable spinal cord signal abnormality. Severe bilateral neural foraminal narrowing.  C6-C7: Shallow disc bulge. Mild uncovertebral hypertrophy. No significant spinal canal or foraminal stenosis.  C7-T1: Mild facet arthrosis. No significant disc herniation or stenosis.  IMPRESSION: Cervical spondylosis, as outlined and with findings most notably as follows.  At C5-C6, there is mild-to-moderate disc degeneration with trace degenerative endplate edema. Posterior disc osteophyte complex with bilateral disc osteophyte ridge/uncinate hypertrophy. Severe spinal canal stenosis with moderate spinal cord flattening. Severe bilateral neural foraminal narrowing.  No significant spinal canal stenosis at the remaining levels. Uncovertebral hypertrophy contributes to mild right neural foraminal narrowing at C3-C4.  Straightening of the expected cervical lordosis.  Mild cervical levocurvature, possibly positional.   Electronically Signed By: Kellie Simmering D.O. On: 08/18/2021 18:17  Complexity Note: Imaging results reviewed. Results shared with Kristine [name], using Layman's terms.                         ROS  Cardiovascular: Heart murmur, Heart  valve problems, and Heart catheterization Pulmonary or Respiratory: Wheezing and difficulty taking a deep full breath (Asthma), Shortness of breath, Snoring , and Coughing up mucus (Bronchitis) Neurological: No reported neurological signs or symptoms such as seizures, abnormal skin sensations, urinary and/or fecal incontinence, being born with an abnormal open spine and/or a tethered spinal cord Psychological-Psychiatric: Anxiousness, Depressed, Prone to panicking, History of abuse, and Difficulty sleeping and or falling asleep Gastrointestinal: Vomiting blood (Ulcers), Reflux or heatburn, and Irregular, infrequent bowel movements (Constipation) Genitourinary: Peeing blood and Recurrent Urinary Tract infections Hematological: Weakness due to low blood hemoglobin or red blood cell count (Anemia) and Low platelet levels (Thrombocytopenia) Endocrine: Slow thyroid Rheumatologic: No reported rheumatological signs and symptoms such as fatigue, joint pain, tenderness, swelling, redness, heat, stiffness, decreased range of motion, with or without associated rash Musculoskeletal: Negative for myasthenia gravis, muscular dystrophy, multiple sclerosis or malignant hyperthermia Work History: Working full time  Allergies  Ms. Miotke is allergic to bee pollen, bee venom, eggs or egg-derived products, keflex [cephalexin], lac bovis, milk-related compounds, penicillins, shellfish allergy, soy allergy, tilactase, topamax [topiramate], wheat bran, butalbital-apap-caffeine, codeine, ferrous gluconate, gabapentin, hydrocodone, iron, meclizine, red dye, iodine, and latex.  Laboratory Chemistry Profile   Renal Lab Results  Component Value Date   BUN 17 09/19/2017   CREATININE 0.85 09/19/2017   BCR  27 (H) 04/17/2016   GFRAA >60 09/19/2017   GFRNONAA >60 09/19/2017   SPECGRAV 1.025 06/13/2016   PHUR 6.0 06/13/2016   PROTEINUR Negative 06/13/2016     Electrolytes Lab Results  Component Value Date   NA 137  09/19/2017   K 3.2 (L) 09/19/2017   CL 96 (L) 09/19/2017   CALCIUM 9.1 09/19/2017   MG 2.1 04/09/2017     Hepatic Lab Results  Component Value Date   AST 27 09/04/2016   ALT 32 09/04/2016   ALBUMIN 4.1 09/04/2016   ALKPHOS 44 09/04/2016     ID Lab Results  Component Value Date   SARSCOV2NAA NEGATIVE 06/30/2020   STAPHAUREUS NEGATIVE 10/10/2016   MRSAPCR NEGATIVE 10/10/2016   PREGTESTUR NEGATIVE 08/20/2016     Bone No results found for: VD25OH, [id number], XF8182XH3, ZJ6967EL3, 25OHVITD1, 25OHVITD2, 25OHVITD3, TESTOFREE, TESTOSTERONE   Endocrine Lab Results  Component Value Date   GLUCOSE 102 (H) 09/19/2017   GLUCOSEU Negative 06/13/2016   TSH 5.752 (H) 09/19/2017   FREET4 0.89 09/19/2017     Neuropathy No results found for: VITAMINB12, FOLATE, HGBA1C, HIV   CNS No results found for: COLORCSF, APPEARCSF, RBCCOUNTCSF, WBCCSF, POLYSCSF, LYMPHSCSF, EOSCSF, PROTEINCSF, GLUCCSF, JCVIRUS, CSFOLI, IGGCSF, LABACHR, ACETBL, LABACHR, ACETBL   Inflammation (CRP: Acute  ESR: Chronic) Lab Results  Component Value Date   ESRSEDRATE 11 09/04/2016     Rheumatology Lab Results  Component Value Date   ANA Negative 09/04/2016     Coagulation Lab Results  Component Value Date   INR 0.9 01/22/2012   LABPROT 12.7 01/22/2012   APTT 33.8 01/22/2012   PLT 352 09/19/2017     Cardiovascular Lab Results  Component Value Date   BNP 44 10/27/2012   CKTOTAL 164 08/26/2012   CKMB 2.2 08/26/2012   TROPONINI <0.03 09/19/2017   HGB 14.0 09/19/2017   HCT 41.6 09/19/2017     Screening Lab Results  Component Value Date   SARSCOV2NAA NEGATIVE 06/30/2020   STAPHAUREUS NEGATIVE 10/10/2016   MRSAPCR NEGATIVE 10/10/2016   PREGTESTUR NEGATIVE 08/20/2016     Cancer No results found for: CEA, CA125, LABCA2   Allergens No results found for: ALMOND, APPLE, ASPARAGUS, AVOCADO, BANANA, BARLEY, BASIL, BAYLEAF, GREENBEAN, LIMABEAN, WHITEBEAN, BEEFIGE, REDBEET, BLUEBERRY, BROCCOLI,  CABBAGE, MELON, CARROT, CASEIN, CASHEWNUT, CAULIFLOWER, CELERY     Note: Lab results reviewed.  Rafael Gonzalez  Drug: Kristine [name]  reports no history of drug use. Alcohol:  reports no history of alcohol use. Tobacco:  reports that she quit smoking about 14 years ago. Her smoking use included cigarettes. She has never used smokeless tobacco. Medical:  has a past medical history of Abnormal weight gain (08/05/2013), Absence of interventricular septum (08/05/2013), Allergic rhinitis (04/17/2016), Allergic urticaria due to ingested food (02/09/2016), Anxiety, Asthma, Cancer ([place]), Clinical depression (81/11/7508), Complication of anesthesia, Depression, Dizziness, Excess weight (09/30/2013), GERD (gastroesophageal reflux disease), Headache, migraine (09/30/2013), Heart murmur, Heavy drinker (04/11/2014), History of methicillin resistant staphylococcus aureus (MRSA) (11/2015), Infection of urinary tract (09/30/2013), Irregular bleeding (09/30/2013), Multiple allergies, and VSD (ventricular septal defect and aortic arch hypoplasia. Family: family history includes Alcohol abuse in her mother; Aneurysm in her mother; Anxiety disorder in her father and mother; Bipolar disorder in her paternal aunt; Breast cancer in her maternal aunt, maternal grandmother, paternal aunt, paternal aunt, and paternal grandmother; Cancer in her father; Depression in her father and mother; Drug abuse in her mother; Kidney cancer in her maternal uncle; Kidney disease in an other family member; Liver disease in her mother;  Prostate cancer in her paternal grandfather.  Past Surgical History:  Procedure Laterality Date   ABDOMINAL HYSTERECTOMY     BOTOX INJECTION N/A 10/23/2016   Procedure: BOTOX INJECTION;  Surgeon: Royston Cowper, MD;  Location: [facility];  Service: Urology;  Laterality: N/A;   BREAST BIOPSY Left 11/13/2013   benign   CESAREAN SECTION     COLONOSCOPY     COLONOSCOPY WITH PROPOFOL N/A 07/04/2020   Procedure: COLONOSCOPY WITH  PROPOFOL;  Surgeon: Lesly Rubenstein, MD;  Location: [facility];  Service: Endoscopy;  Laterality: N/A;   CORONARY ANGIOPLASTY     COSMETIC SURGERY  06/09/2021   tummy tuck   COSMETIC SURGERY     CYSTOSCOPY N/A 10/23/2016   Procedure: CYSTOSCOPY;  Surgeon: Royston Cowper, MD;  Location: [facility];  Service: Urology;  Laterality: N/A;   CYSTOSCOPY WITH HYDRODISTENSION AND BIOPSY     LAPAROSCOPIC ASSISTED VAGINAL HYSTERECTOMY N/A 08/20/2016   Procedure: LAPAROSCOPIC ASSISTED VAGINAL HYSTERECTOMY;  Surgeon: Boykin Nearing, MD;  Location: [facility];  Service: Gynecology;  Laterality: N/A;   LAPAROSCOPIC BILATERAL SALPINGECTOMY Bilateral 08/20/2016   Procedure: LAPAROSCOPIC BILATERAL SALPINGECTOMY;  Surgeon: Boykin Nearing, MD;  Location: [facility];  Service: Gynecology;  Laterality: Bilateral;   NASAL SINUS SURGERY     x12   Active Ambulatory Problems    Diagnosis Date Noted   Depression 09/30/2013   Allergic rhinitis 04/17/2016   Heavy drinker 04/11/2014   Irregular bleeding 09/30/2013   Migraine headache 09/30/2013   Excess weight 09/30/2013   Frequent UTI 09/30/2013   Infection of urinary tract 09/30/2013   Allergic urticaria due to ingested food 02/09/2016   VSD (ventricular septal defect and aortic arch hypoplasia 08/05/2013   Abnormal weight gain 08/05/2013   Postoperative state 08/20/2016   Chronic idiopathic urticaria 09/03/2016   Menorrhagia with irregular cycle 09/17/2016   On prednisone therapy 06/20/2016   Calcaneal spur 07/10/2016   Elevated TSH 07/03/2017   Chest pain 09/23/2017   Cervical cancer ([place]) 08/18/2018   Bilateral carpal tunnel syndrome 08/27/2018   Intractable migraine with aura without status migrainosus 08/27/2018   Polyneuropathy 08/27/2018   GAD (generalized anxiety disorder) 06/22/2019   Insomnia due to medical condition 06/22/2019   Needle phobia 06/22/2019   Basal cell carcinoma, eyelid, left 02/14/2018   Sinusitis accessory  02/03/2021   Plantar fasciitis 07/10/2016   Disequilibrium 09/19/2016   Chronic otitis externa of both ears 10/15/2016   Basal cell carcinoma (BCC) in situ of skin 03/14/2019   S/P bilateral breast reduction 06/23/2021   Cervical radicular pain (R>L) 10/11/2021   Spinal stenosis in cervical region 10/11/2021   Cervical spondylosis 10/11/2021   Resolved Ambulatory Problems    Diagnosis Date Noted   No Resolved Ambulatory Problems   Past Medical History:  Diagnosis Date   Absence of interventricular septum 08/05/2013   Anxiety    Asthma    Cancer ([place])    Clinical depression 79/43/2761   Complication of anesthesia    Dizziness    GERD (gastroesophageal reflux disease)    Headache, migraine 09/30/2013   Heart murmur    History of methicillin resistant staphylococcus aureus (MRSA) 11/2015   Multiple allergies    Constitutional Exam  General appearance: Well nourished, well developed, and well hydrated. In no apparent acute distress Vitals:   10/11/21 1322  BP: (!) 110/59  Pulse: 78  Resp: 16  Temp: (!) 97.2 F (36.2 C)  TempSrc: Temporal  SpO2: 100%  Weight: 155 lb (  70.3 kg)  Height: _0  (1.575 m)   BMI Assessment: Estimated body mass index is 28.35 kg/m as calculated from the following:   Height as of this encounter: _1  (1.575 m).   Weight as of this encounter: 155 lb (70.3 kg).  BMI interpretation table: BMI level Category Range association with higher incidence of chronic pain  <18 kg/m2 Underweight   18.5-24.9 kg/m2 Ideal body weight   25-29.9 kg/m2 Overweight Increased incidence by 20%  30-34.9 kg/m2 Obese (Class I) Increased incidence by 68%  35-39.9 kg/m2 Severe obesity (Class II) Increased incidence by 136%  >40 kg/m2 Extreme obesity (Class III) Increased incidence by 254%   Patient's current BMI Ideal Body weight  Body mass index is 28.35 kg/m. Ideal body weight: 50.1 kg (110 lb 7.2 oz) Adjusted ideal body weight: 58.2 kg (128 lb 4.3 oz)   BMI  Readings from Last 4 Encounters:  10/11/21 28.35 kg/m  05/24/21 29.80 kg/m  05/16/21 29.81 kg/m  07/04/20 30.36 kg/m   Wt Readings from Last 4 Encounters:  10/11/21 155 lb (70.3 kg)  05/24/21 162 lb 14.7 oz (73.9 kg)  05/16/21 163 lb (73.9 kg)  07/04/20 166 lb (75.3 kg)    Psych/Mental status: Alert, oriented x 3 (person, place, & time)       Eyes: PERLA Respiratory: No evidence of acute respiratory distress  Cervical Spine Area Exam  Skin & Axial Inspection: No masses, redness, edema, swelling, or associated skin lesions Alignment: Symmetrical Functional ROM: Pain restricted ROM      Stability: No instability detected Muscle Tone/Strength: Functionally intact. No obvious neuro-muscular anomalies detected. Sensory (Neurological): Dermatomal pain pattern Palpation: No palpable anomalies             Upper Extremity (UE) Exam    Side: Right upper extremity  Side: Left upper extremity  Skin & Extremity Inspection: Skin color, temperature, and hair growth are WNL. No peripheral edema or cyanosis. No masses, redness, swelling, asymmetry, or associated skin lesions. No contractures.  Skin & Extremity Inspection: Skin color, temperature, and hair growth are WNL. No peripheral edema or cyanosis. No masses, redness, swelling, asymmetry, or associated skin lesions. No contractures.  Functional ROM: Unrestricted ROM          Functional ROM: Unrestricted ROM          Muscle Tone/Strength: Functionally intact. No obvious neuro-muscular anomalies detected.  Muscle Tone/Strength: Functionally intact. No obvious neuro-muscular anomalies detected.  Sensory (Neurological): Dermatomal pain pattern          Sensory (Neurological): Unimpaired          Palpation: No palpable anomalies              Palpation: No palpable anomalies              Provocative Test(s):  Phalen's test: deferred Tinel's test: deferred Apley's scratch test (touch opposite shoulder):  Action 1 (Across chest): Decreased  ROM Action 2 (Overhead): Decreased ROM Action 3 (LB reach): deferred   Provocative Test(s):  Phalen's test: deferred Tinel's test: deferred Apley's scratch test (touch opposite shoulder):  Action 1 (Across chest): Decreased ROM Action 2 (Overhead): Decreased ROM Action 3 (LB reach): deferred    5 out of 5 strength bilateral upper extremity: Shoulder abduction, elbow flexion, elbow extension, thumb extension.   Assessment  Primary Diagnosis & Pertinent Problem List: The primary encounter diagnosis was Cervical radicular pain (R>L). Diagnoses of Neuroforaminal stenosis of cervical spine (C5/6), Spinal stenosis in cervical region (severe C5/6),  Cervical spondylosis, and Chronic pain syndrome were also pertinent to this visit.  Visit Diagnosis (New problems to examiner): 1. Cervical radicular pain (R>L)   2. Neuroforaminal stenosis of cervical spine (C5/6)   3. Spinal stenosis in cervical region (severe C5/6)   4. Cervical spondylosis   5. Chronic pain syndrome    Plan of Care (Initial workup plan)   Kristine [name] has a significant C5-C6 disc herniation resulting in central canal and neuroforaminal stenosis with associated radicular pain of her upper extremity.  She has exhausted conservative measures including physical therapy, aquatic therapy, medication management.  Discussed cervical epidural steroid injection with sedation, IV Versed in clinic.  Risks and benefits reviewed and patient would like to proceed.  Orders Placed This Encounter  Procedures   Cervical Epidural Injection    Sedation: IV Versed Purpose: Diagnostic/Therapeutic Indication(s): Radiculitis and cervicalgia associater with cervical degenerative disc disease.    Standing Status:   Future    Standing Expiration Date:   01/09/2022    Scheduling Instructions:     Procedure: Cervical Epidural Steroid Injection/Block     Level(s): C7-T1     Laterality: TBD     Timeframe: IV Versed    Order Specific Question:   Where  will this procedure be performed?    Answer:   [facility]    Comments:   Kristine [name]     Procedure Orders         Cervical Epidural Injection     I spent a total of 60 minutes reviewing chart data, face-to-face evaluation with the patient, counseling and coordination of care as detailed above.   Provider-requested follow-up: Return in about 2 weeks (around 10/25/2021) for C7/T1 ESI IV versed (in clinic).  Future Appointments  Date Time Provider [facility]  10/26/2021  3:45 PM Ralene Bathe, MD [facility] None    Note by: Gillis Santa, MD Date: 10/11/2021; Time: 3:15 PM

## 2021-10-12 ENCOUNTER — Telehealth: Payer: Self-pay

## 2021-10-12 NOTE — Telephone Encounter (Signed)
Called patient back, she was wondering if she could take her allergy medicines prior to procedure and I told  her that as long as she does okay taking them on an empty stomach she could take them.  Patient verbalizes u/o information.

## 2021-10-12 NOTE — Telephone Encounter (Signed)
I scheduled her for a cesi Monday and she has questions about taking her allergy medicine.

## 2021-10-16 ENCOUNTER — Ambulatory Visit (HOSPITAL_BASED_OUTPATIENT_CLINIC_OR_DEPARTMENT_OTHER): Payer: BC Managed Care – PPO | Admitting: Student in an Organized Health Care Education/Training Program

## 2021-10-16 ENCOUNTER — Ambulatory Visit
Admission: RE | Admit: 2021-10-16 | Discharge: 2021-10-16 | Disposition: A | Discharge status: Home / Self Care | Payer: BC Managed Care – PPO | Source: Ambulatory Visit | Attending: Student in an Organized Health Care Education/Training Program | Admitting: Student in an Organized Health Care Education/Training Program

## 2021-10-16 ENCOUNTER — Other Ambulatory Visit: Payer: Self-pay

## 2021-10-16 DIAGNOSIS — M4802 Spinal stenosis, cervical region: Secondary | ICD-10-CM

## 2021-10-16 DIAGNOSIS — G894 Chronic pain syndrome: Secondary | ICD-10-CM | POA: Diagnosis present

## 2021-10-16 DIAGNOSIS — M5412 Radiculopathy, cervical region: Secondary | ICD-10-CM | POA: Diagnosis present

## 2021-10-16 MED ORDER — LIDOCAINE HCL 2 % IJ SOLN
20.0000 mL | Freq: Once | INTRAMUSCULAR | Status: AC
  Administered 2021-10-16: 400 mg

## 2021-10-16 MED ORDER — LIDOCAINE HCL 2 % IJ SOLN
INTRAMUSCULAR | Status: AC
  Filled 2021-10-16: qty 10

## 2021-10-16 MED ORDER — SODIUM CHLORIDE 0.9% FLUSH
1.0000 mL | Freq: Once | INTRAVENOUS | Status: AC
  Administered 2021-10-16: 1 mL

## 2021-10-16 MED ORDER — SODIUM CHLORIDE (PF) 0.9 % IJ SOLN
INTRAMUSCULAR | Status: AC
  Filled 2021-10-16: qty 10

## 2021-10-16 MED ORDER — MIDAZOLAM HCL 5 MG/5ML IJ SOLN
INTRAMUSCULAR | Status: AC
  Filled 2021-10-16: qty 5

## 2021-10-16 MED ORDER — MIDAZOLAM HCL 5 MG/5ML IJ SOLN
0.5000 mg | Freq: Once | INTRAMUSCULAR | Status: AC
  Administered 2021-10-16: 1.5 mg via INTRAVENOUS

## 2021-10-16 MED ORDER — IOHEXOL 180 MG/ML  SOLN
10.0000 mL | Freq: Once | INTRAMUSCULAR | Status: AC
  Administered 2021-10-16: 0.5 mL via EPIDURAL

## 2021-10-16 MED ORDER — ROPIVACAINE HCL 2 MG/ML IJ SOLN
1.0000 mL | Freq: Once | INTRAMUSCULAR | Status: AC
  Administered 2021-10-16: 1 mL via EPIDURAL

## 2021-10-16 MED ORDER — ROPIVACAINE HCL 2 MG/ML IJ SOLN
INTRAMUSCULAR | Status: AC
  Filled 2021-10-16: qty 20

## 2021-10-16 MED ORDER — DEXAMETHASONE SODIUM PHOSPHATE 10 MG/ML IJ SOLN
INTRAMUSCULAR | Status: AC
  Filled 2021-10-16: qty 1

## 2021-10-16 MED ORDER — DEXAMETHASONE SODIUM PHOSPHATE 10 MG/ML IJ SOLN
10.0000 mg | Freq: Once | INTRAMUSCULAR | Status: AC
  Administered 2021-10-16: 10 mg

## 2021-10-16 NOTE — Patient Instructions (Signed)

## 2021-10-16 NOTE — Progress Notes (Signed)
Safety precautions to be maintained throughout the outpatient stay will include: orient to surroundings, keep bed in low position, maintain call bell within reach at all times, provide assistance with transfer out of bed and ambulation.  

## 2021-10-16 NOTE — Progress Notes (Signed)
PROVIDER NOTE: Interpretation of information contained herein should be left to medically-trained personnel. Specific patient instructions are provided elsewhere under "Patient Instructions" section of medical record. This document was created in part using STT-dictation technology, any transcriptional errors that may result from this process are unintentional.  Patient: Kristine [name] Type: Established DOB: 10-22-1971 MRN: [id number] PCP: Gladstone Lighter, MD  Service: Procedure DOS: 10/16/2021 Setting: Ambulatory Location: Ambulatory outpatient facility Delivery: Face-to-face Provider: Gillis Santa, MD Specialty: Interventional Pain Management Specialty designation: 09 Location: Outpatient facility Ref. Prov.: Gillis Santa, MD   Procedure [facility] )    Procedure: Interlaminar Cervical Epidural Steroid injection (ESI) Laterality: Right Level: C7-T1 Analgesia: Local anesthesia Sedation: minimal anxiolysis.IV versed   Imaging: Fluoroscopy-assisted  Purpose: Diagnostic/Therapeutic Indications: Cervicalgia, cervical radicular pain, degenerative disc disease, severe enough to impact quality of life or function.  NAS-11 score:   Pre-procedure: 7 /10   Post-procedure: 5 /10     1. Cervical radicular pain (R>L)   2. Spinal stenosis in cervical region (severe C5/6)   3. Neuroforaminal stenosis of cervical spine (C5/6)   4. Chronic pain syndrome    Pre-Procedure Preparation  Monitoring: As per clinic protocol. Respiration, ETCO2, SpO2, BP, heart rate and rhythm monitor placed and checked for adequate function  Risk Assessment: Vitals:  [id number] body mass index is 28.35 kg/m as calculated from the following:   Height as of this encounter: 5\' 2"  (1.575 m).   Weight as of this encounter: 155 lb (70.3 kg)., Rate:81ECG Heart Rate: 81, BP:121/64, Resp:16, Temp:(!) 96.6 F (35.9 C), SpO2:99 %  Allergies: She is allergic to bee pollen, bee venom, eggs or  egg-derived products, keflex [cephalexin], lac bovis, milk-related compounds, penicillins, shellfish allergy, soy allergy, tilactase, topamax [topiramate], wheat bran, butalbital-apap-caffeine, codeine, ferrous gluconate, gabapentin, hydrocodone, iron, meclizine, red dye, iodine, and latex.  Precautions: Latex-free protocol activated  Blood-thinner(s): None at this time  Coagulopathies: Reviewed. None identified.   Active Infection(s): Reviewed. None identified. Kristine [name] is afebrile   Location setting: Procedure suite Position: Prone, on modified reverse trendelenburg to facilitate breathing, with head in head-cradle. Pillows positioned under chest (below chin-level) with cervical spine flexed. Safety Precautions: Patient was assessed for positional comfort and pressure points before starting the procedure. Prepping solution: DuraPrep (Iodine Povacrylex [0.7% available iodine] and Isopropyl Alcohol, 74% w/w) Prep Area: Entire  cervicothoracic region Approach: percutaneous, paramedial Intended target: Posterior cervical epidural space Materials: Tray: Epidural Needle(s): Epidural (Tuohy) Qty: 1 Length: (16mm) 3.5-inch Gauge: 22G  3 cc solution made of 1 cc of preservative-free saline, 1 cc of 0.2% ropivacaine, 1 cc of Decadron 10 mg/cc.    Meds ordered this encounter  Medications   iohexol (OMNIPAQUE) 180 MG/ML injection 10 mL    Must be Myelogram-compatible. If not available, you may substitute with a water-soluble, non-ionic, hypoallergenic, myelogram-compatible radiological contrast medium.   lidocaine (XYLOCAINE) 2 % (with pres) injection 400 mg   midazolam (VERSED) 5 MG/5ML injection 0.5-2 mg    Make sure Flumazenil is available in the pyxis when using this medication. If oversedation occurs, administer 0.2 mg IV over 15 sec. If after 45 sec no response, administer 0.2 mg again over 1 min; may repeat at 1 min intervals; not to exceed 4 doses (1 mg)   ropivacaine (PF) 2 mg/mL  (0.2%) (NAROPIN) injection 1 mL   sodium chloride flush (NS) 0.9 % injection 1 mL   dexamethasone (DECADRON) injection 10 mg    Orders Placed This Encounter  Procedures  DG PAIN CLINIC C-ARM 1-60 MIN NO REPORT    Intraoperative interpretation by procedural physician at [place].    Standing Status:   Standing    Number of Occurrences:   1    Order Specific Question:   Reason for exam:    Answer:   Assistance in needle guidance and placement for procedures requiring needle placement in or near specific anatomical locations not easily accessible without such assistance.     Time-out: 1115 I initiated and conducted the "Time-out" before starting the procedure, as per protocol. The patient was asked to participate by confirming the accuracy of the "Time Out" information. Verification of the correct person, site, and procedure were performed and confirmed by me, the nursing staff, and the patient. "Time-out" conducted as per Joint Commission's Universal Protocol (UP.01.01.01). Procedure checklist: Completed   H&P (Pre-op  Assessment)  Kristine [name] is a 50 y.o. (year old), female patient, seen today for interventional treatment. She  has a past surgical history that includes Nasal sinus surgery; Colonoscopy; Cystoscopy with hydrodistension and biopsy; Cesarean section; Laparoscopic assisted vaginal hysterectomy (N/A, 08/20/2016); Laparoscopic bilateral salpingectomy (Bilateral, 08/20/2016); Coronary angioplasty; Cystoscopy (N/A, 10/23/2016); Botox injection (N/A, 10/23/2016); Colonoscopy with propofol (N/A, 07/04/2020); Abdominal hysterectomy; Breast biopsy (Left, 11/13/2013); Cosmetic surgery (06/09/2021); and Cosmetic surgery. Kristine [name] has a current medication list which includes the following prescription(s): albuterol, alprazolam, atrovent hfa, azelastine, biotin, wynzora, cetirizine, cosentyx sensoready pen, cranberry, eucrisa, desvenlafaxine, dibucaine, epinephrine, fexofenadine,  hydrochlorothiazide, levocetirizine, levothyroxine, montelukast, nitrofurantoin (macrocrystal-monohydrate), omalizumab, ondansetron, otezla, polyethylene glycol, potassium chloride, relion pen needles, spiriva respimat, vitamin b-12, vitamin c, azelastine, azelastine hcl, botox, budesonide, clobetasol ointment, cyanocobalamin, fluticasone, fluticasone, fluticasone-salmeterol, ajovy, ipratropium, ipratropium, magnesium oxide, mometasone, multiple vitamins-minerals, chewable vite childrens, prednisone, and triamcinolone cream. Her primarily concern today is the Neck Pain  She is allergic to bee pollen, bee venom, eggs or egg-derived products, keflex [cephalexin], lac bovis, milk-related compounds, penicillins, shellfish allergy, soy allergy, tilactase, topamax [topiramate], wheat bran, butalbital-apap-caffeine, codeine, ferrous gluconate, gabapentin, hydrocodone, iron, meclizine, red dye, iodine, and latex.   Last encounter: My last encounter with her was on 10/11/2021. Pertinent problems: Kristine [name] does not have any pertinent problems on file. Pain Assessment: Severity of Chronic pain is reported as a 7 /10. Location: Neck  /radiates ato the TMJ muscles and back of head. Onset: More than a month ago. Quality: Discomfort, Constant (tightness causes migraine). Timing: Constant. Modifying factor(s): heat, massage, botox injections, chiropractor. Vitals:  height is 5\' 2"  (1.575 m) and weight is 155 lb (70.3 kg). Her temporal temperature is 96.6 F (35.9 C) (abnormal). Her blood pressure is 123/55 (abnormal) and her pulse is 81. Her respiration is 16 and oxygen saturation is 100%.   Reason for encounter: Interventional pain management therapy due pain of at least four (4) weeks in duration, with to failure to respond to and/or inability to tolerate more conservative care.   Related imaging: Cervical MR wo contrast:  Results for orders placed during the hospital encounter of 08/18/21  MR CERVICAL SPINE WO  CONTRAST  Narrative CLINICAL DATA:  Cervicalgia. Additional history provided: Patient reports chronic neck pain for greater than 10 years, no known injury. Associated occipital headaches.  EXAM: MRI CERVICAL SPINE WITHOUT CONTRAST  TECHNIQUE: Multiplanar, multisequence MR imaging of the cervical spine was performed. No intravenous contrast was administered.  COMPARISON:  No pertinent prior exams available for comparison.  FINDINGS: Alignment: Straightening of the expected cervical lordosis. Mild cervical levocurvature, possibly positional. No significant spondylolisthesis.  Vertebrae: Vertebral  body height is maintained. Trace degenerative endplate edema at A8-T4.  Cord: No spinal cord signal abnormality is identified. Spinal cord flattening at C5-C6, as described below.  Posterior Fossa, vertebral arteries, paraspinal tissues: No abnormality identified within included portions of the posterior fossa. Flow voids preserved within the imaged cervical vertebral arteries. Paraspinal soft tissues unremarkable.  Disc levels:  Mild-to-moderate disc degeneration at C5-C6. No more than mild disc degeneration at the remaining levels.  C2-C3: Mild facet arthrosis on the right. No significant disc herniation or stenosis.  C3-C4: Uncovertebral hypertrophy on the right. Mild facet arthrosis. No significant spinal canal stenosis. Mild right neural foraminal narrowing.  C4-C5: Shallow disc bulge. Minimal bilateral uncovertebral hypertrophy. No significant spinal canal or foraminal stenosis.  C5-C6: Posterior disc osteophyte complex with bilateral disc osteophyte ridge/uncinate hypertrophy. Severe spinal canal stenosis with moderate spinal cord flattening. No appreciable spinal cord signal abnormality. Severe bilateral neural foraminal narrowing.  C6-C7: Shallow disc bulge. Mild uncovertebral hypertrophy. No significant spinal canal or foraminal stenosis.  C7-T1: Mild facet  arthrosis. No significant disc herniation or stenosis.  IMPRESSION: Cervical spondylosis, as outlined and with findings most notably as follows.  At C5-C6, there is mild-to-moderate disc degeneration with trace degenerative endplate edema. Posterior disc osteophyte complex with bilateral disc osteophyte ridge/uncinate hypertrophy. Severe spinal canal stenosis with moderate spinal cord flattening. Severe bilateral neural foraminal narrowing.  No significant spinal canal stenosis at the remaining levels. Uncovertebral hypertrophy contributes to mild right neural foraminal narrowing at C3-C4.  Straightening of the expected cervical lordosis.  Mild cervical levocurvature, possibly positional.   Electronically Signed By: Kellie Simmering D.O. On: 08/18/2021 18:17  Cervical MR wo contrast: No valid procedures specified.    Site Confirmation: Kristine [name] was asked to confirm the procedure and laterality before marking the site.  Consent: Before the procedure and under the influence of no sedative(s), amnesic(s), or anxiolytics, the patient was informed of the treatment options, risks and possible complications. To fulfill our ethical and legal obligations, as recommended by the American Medical Association's Code of Ethics, I have informed the patient of my clinical impression; the nature and purpose of the treatment or procedure; the risks, benefits, and possible complications of the intervention; the alternatives, including doing nothing; the risk(s) and benefit(s) of the alternative treatment(s) or procedure(s); and the risk(s) and benefit(s) of doing nothing. The patient was provided information about the general risks and possible complications associated with the procedure. These may include, but are not limited to: failure to achieve desired goals, infection, bleeding, organ or nerve damage, allergic reactions, paralysis, and death. In addition, the patient was informed of those risks and  complications associated to Spine-related procedures, such as failure to decrease pain; infection (i.e.: Meningitis, epidural or intraspinal abscess); bleeding (i.e.: epidural hematoma, subarachnoid hemorrhage, or any other type of intraspinal or peri-dural bleeding); organ or nerve damage (i.e.: Any type of peripheral nerve, nerve root, or spinal cord injury) with subsequent damage to sensory, motor, and/or autonomic systems, resulting in permanent pain, numbness, and/or weakness of one or several areas of the body; allergic reactions; (i.e.: anaphylactic reaction); and/or death. Furthermore, the patient was informed of those risks and complications associated with the medications. These include, but are not limited to: allergic reactions (i.e.: anaphylactic or anaphylactoid reaction(s)); adrenal axis suppression; blood sugar elevation that in diabetics may result in ketoacidosis or comma; water retention that in patients with history of congestive heart failure may result in shortness of breath, pulmonary edema, and decompensation with resultant  heart failure; weight gain; swelling or edema; medication-induced neural toxicity; particulate matter embolism and blood vessel occlusion with resultant organ, and/or nervous system infarction; and/or aseptic necrosis of one or more joints. Finally, the patient was informed that Medicine is not an exact science; therefore, there is also the possibility of unforeseen or unpredictable risks and/or possible complications that may result in a catastrophic outcome. The patient indicated having understood very clearly. We have given the patient no guarantees and we have made no promises. Enough time was given to the patient to ask questions, all of which were answered to the patient's satisfaction. Kristine [name] has indicated that she wanted to continue with the procedure. Attestation: I, the ordering provider, attest that I have discussed with the patient the benefits, risks,  side-effects, alternatives, likelihood of achieving goals, and potential problems during recovery for the procedure that I have provided informed consent.  Date  Time: 10/16/2021  9:51 AM    Description of procedure   Start Time: 1115 hrs  Local Anesthesia: Once the patient was positioned, prepped, and time-out was completed. The target area was identified located. The skin was marked with an approved surgical skin marker. Once marked, the skin (epidermis, dermis, and hypodermis), and deeper tissues (fat, connective tissue and muscle) were infiltrated with a small amount of a short-acting local anesthetic, loaded on a 10cc syringe with a 25G, 1.5-in  Needle. An appropriate amount of time was allowed for local anesthetics to take effect before proceeding to the next step. Local Anesthetic: Lidocaine 1-2% The unused portion of the local anesthetic was discarded in the proper designated containers. Safety Precautions: Aspiration looking for blood return was conducted prior to all injections. At no point did I inject any substances, as a needle was being advanced. Before injecting, the patient was told to immediately notify me if she was experiencing any new onset of "ringing in the ears, or metallic taste in the mouth". No attempts were made at seeking any paresthesias. Safe injection practices and needle disposal techniques used. Medications properly checked for expiration dates. SDV (single dose vial) medications used. After the completion of the procedure, all disposable equipment used was discarded in the proper designated medical waste containers.  Technical description: Protocol guidelines were followed. Using fluoroscopic guidance, the epidural needle was introduced through the skin, ipsilateral to the reported pain, and advanced to the target area. Posterior laminar os was contacted and the needle walked caudad, until the lamina was cleared. The ligamentum flavum was engaged and the epidural space  identified using "loss-of-resistance technique" with 2-3 ml of PF-NaCl (0.9% NSS), in a 5cc dedicated LOR syringe. See "Imaging guidance" below for use of contrast details.  Injection: Once satisfactory needle placement was confirmed, I proceeded to inject the desired solution in slow, incremental fashion, intermittently assessing for discomfort or any signs of abnormal or undesired spread of substance. Once completed, the needle was removed and disposed of, as per hospital protocols.   Vitals:   10/16/21 1115 10/16/21 1120 10/16/21 1124 10/16/21 1130  BP: 125/77 120/78 122/78 (!) 123/55  Pulse:      Resp: 16 18 16 16   Temp:      TempSrc:      SpO2: 100% 95% 98% 100%  Weight:      Height:        End Time: 1122 hrs  Once the entire procedure was completed, the treated area was cleaned, making sure to leave some of the prepping solution back to take advantage of  its long term bactericidal properties.   Imaging guidance  Type of Imaging Technique: Fluoroscopy Guidance (Spinal) Indication(s): Assistance in needle guidance and placement for procedures requiring needle placement in or near specific anatomical locations not easily accessible without such assistance. Exposure Time: Please see nurses notes for exact fluoroscopy time. Contrast: Before injecting any contrast, we confirmed that the patient did not have an allergy to iodine, shellfish, or radiological contrast. Once satisfactory needle placement was completed, radiological contrast was injected under continuous fluoroscopic guidance. Injection of contrast accomplished without complications. See chart for type and volume of contrast used. Fluoroscopic Guidance: I was personally present in the fluoroscopy suite, where the patient was placed in position for the procedure, over the fluoroscopy-compatible table. Fluoroscopy was manipulated, using "Tunnel Vision Technique", to obtain the best possible view of the target area, on the affected  side. Parallax error was corrected before commencing the procedure. A "direction-depth-direction" technique was used to introduce the needle under continuous pulsed fluoroscopic guidance. Once the target was reached, antero-posterior, oblique, and lateral fluoroscopic projection views were taken to confirm needle placement in all planes. Electronic images uploaded into EMR.  Interpretation: Successful epidural injection. Intraoperative imaging interpretation by performing Physician.    Post-op assessment  Post-procedure Vital Signs:  Pulse/HCG Rate: 8186 (NSR) Temp: (!) 96.6 F (35.9 C) Resp: 16 BP: (!) 123/55 SpO2: 100 %  EBL: None  Complications: No immediate post-treatment complications observed by team, or reported by patient.  Note: The patient tolerated the entire procedure well. A repeat set of vitals were taken after the procedure and the patient was kept under observation following institutional policy, for this type of procedure. Post-procedural neurological assessment was performed, showing return to baseline, prior to discharge. The patient was provided with post-procedure discharge instructions, including a section on how to identify potential problems. Should any problems arise concerning this procedure, the patient was given instructions to immediately contact us, at any time, without hesitation. In any case, we plan to contact the patient by telephone for a follow-up status report regarding this interventional procedure.  Comments:  No additional relevant information.   5 out of 5 strength bilateral upper extremity: Shoulder abduction, elbow flexion, elbow extension, thumb extension.   Plan of care    Medications administered: We administered iohexol, lidocaine, midazolam, ropivacaine (PF) 2 mg/mL (0.2%), sodium chloride flush, and dexamethasone.  Follow-up plan:   Return in about 5 weeks (around 11/20/2021) for Post Procedure Evaluation, virtual.      C7/T1 ESI  10/16/21   Recent Visits Date Type Provider Dept  10/11/21 Office Visit Gillis Santa, MD [facility]  Showing recent visits within past 90 days and meeting all other requirements Today's Visits Date Type Provider Dept  10/16/21 Procedure visit Gillis Santa, MD [facility]  Showing today's visits and meeting all other requirements Future Appointments Date Type Provider Dept  11/20/21 Appointment Gillis Santa, MD [facility]  Showing future appointments within next 90 days and meeting all other requirements  Disposition: Discharge home  Discharge (Date  Time): 10/16/2021;   hrs.   Primary Care Physician: Gladstone Lighter, MD Location: [facility] Note by: Gillis Santa, MD Date: 10/16/2021; Time: 11:41 AM  DISCLAIMER: Medicine is not an exact science. It has no guarantees or warranties. The decision to proceed with this intervention was based on the information collected from the patient. Conclusions were drawn from the patient's questionnaire, interview, and examination. Because information was provided in large part by the  patient, it cannot be guaranteed that it has not been purposely or unconsciously manipulated or altered. Every effort has been made to obtain as much accurate, relevant, available data as possible. Always take into account that the treatment will also be dependent on availability of resources and existing treatment guidelines, considered by other Pain Management Specialists as being common knowledge and practice, at the time of the intervention. It is also important to point out that variation in procedural techniques and pharmacological choices are the acceptable norm. For Medico-Legal review purposes, the indications, contraindications, technique, and results of the these procedures should only be evaluated, judged and interpreted by a Board-Certified Interventional Pain Specialist with extensive familiarity and  expertise in the same exact procedure and technique.

## 2021-10-17 ENCOUNTER — Telehealth: Payer: Self-pay

## 2021-10-17 NOTE — Telephone Encounter (Signed)
Call PP. Denies any needs at this time. Instructed to call if needed.

## 2021-10-19 ENCOUNTER — Telehealth: Payer: Self-pay

## 2021-10-19 NOTE — Telephone Encounter (Signed)
Pt wants a nurse call says the pain is far worse

## 2021-10-19 NOTE — Telephone Encounter (Signed)
Patient reassurred that this may be expected,as it has only been 3 days. Instructed that may take 4-10 days to observe steroid effects of decreased iinflammation. Instructed patient to document this on the post procedure diary. No neurological symptoms,

## 2021-10-26 ENCOUNTER — Ambulatory Visit: Payer: BC Managed Care – PPO | Admitting: Dermatology

## 2021-11-01 ENCOUNTER — Ambulatory Visit: Payer: BC Managed Care – PPO | Admitting: Dermatology

## 2021-11-08 ENCOUNTER — Ambulatory Visit (INDEPENDENT_AMBULATORY_CARE_PROVIDER_SITE_OTHER): Payer: BC Managed Care – PPO | Admitting: Dermatology

## 2021-11-08 ENCOUNTER — Other Ambulatory Visit: Payer: Self-pay

## 2021-11-08 DIAGNOSIS — L910 Hypertrophic scar: Secondary | ICD-10-CM

## 2021-11-08 DIAGNOSIS — L4050 Arthropathic psoriasis, unspecified: Secondary | ICD-10-CM

## 2021-11-08 DIAGNOSIS — L905 Scar conditions and fibrosis of skin: Secondary | ICD-10-CM

## 2021-11-08 DIAGNOSIS — L409 Psoriasis, unspecified: Secondary | ICD-10-CM | POA: Diagnosis not present

## 2021-11-08 NOTE — Progress Notes (Signed)
° °  Follow-Up Visit   Subjective  Kristine [name] is a 50 y.o. female who presents for the following: Psoriasis (With psoriatic arthritis. ).  Patient is using Wynzora Cream to affected areas. Her insurance wouldn't cover [place] Nail and Cosentyx (prescribed by her rheumatologist) at the same time. She had to d/c [facility] and is currently on maintenance dose of Cosentyx. Psoriasis was improved and almost clear while on [place]. Joints may be a little improved since starting Cosentyx, but rash coming back on wrist. No side effects from Cosentyx, no infections.  Patient would also like a recommendation of something to use for scars from recent surgery, July 2022.  The following portions of the chart were reviewed this encounter and updated as appropriate:       Review of Systems:  No other skin or systemic complaints except as noted in HPI or Assessment and Plan.  Objective  Well appearing patient in no apparent distress; mood and affect are within normal limits.  A focused examination was performed including face, extremities. Relevant physical exam findings are noted in the Assessment and Plan.  trunk, extremities Pink scaly patches of the left wrist; light pink scaly patch of the left chest.  bil hip Dyspigmented smooth linear plaque x 2.     Assessment & Plan  Psoriasis trunk, extremities  With associated arthritis, overall improved but not at goal for psoriasis, on Cosentyx for PsA, now off Otezla (per insurance) which worked better for skin  Psoriasis is a chronic non-curable, but treatable genetic/hereditary disease that may have other systemic features affecting other organ systems such as joints (Psoriatic Arthritis). It is associated with an increased risk of inflammatory bowel disease, heart disease, non-alcoholic fatty liver disease, and depression.    Continue Wynzora Cream Apply to AA qd/bid prn. Avoid face.   Continue Cosentyx injections SQ monthly as prescribed by  rheumatologist.   If psoriasis not clearing with Cosentyx only, discussed trying to get R[place] Nail approved in addition to Cosentyx.   Related Medications Calcipotriene-Betameth Diprop (WYNZORA) 0.005-0.064 % CREA Apply 1 application topically in the morning and at bedtime.  Scar bil hip  Hypertrophic, From recent surgery (05/2021)  Recommend Serica moisturizing scar formula cream every night or [facility] brand or Mederma silicone scar sheet every night for the first year after a scar appears to help with scar remodeling if desired. Scars remodel on their own for a full year.     Return in about 3 months (around 02/06/2022) for Psoriasis, TBSE.  IJamesetta Orleans, CMA, am acting as scribe for Brendolyn Patty, MD . Documentation: I have reviewed the above documentation for accuracy and completeness, and I agree with the above.  Brendolyn Patty MD

## 2021-11-08 NOTE — Patient Instructions (Signed)

## 2021-11-12 HISTORY — PX: CERVICAL FUSION: SHX112

## 2021-11-20 ENCOUNTER — Ambulatory Visit
Payer: BC Managed Care – PPO | Attending: Student in an Organized Health Care Education/Training Program | Admitting: Student in an Organized Health Care Education/Training Program

## 2021-11-20 ENCOUNTER — Other Ambulatory Visit: Payer: Self-pay

## 2021-11-20 ENCOUNTER — Encounter: Payer: Self-pay | Admitting: Student in an Organized Health Care Education/Training Program

## 2021-11-20 DIAGNOSIS — G894 Chronic pain syndrome: Secondary | ICD-10-CM

## 2021-11-20 DIAGNOSIS — M4802 Spinal stenosis, cervical region: Secondary | ICD-10-CM

## 2021-11-20 DIAGNOSIS — M47812 Spondylosis without myelopathy or radiculopathy, cervical region: Secondary | ICD-10-CM

## 2021-11-20 DIAGNOSIS — M5412 Radiculopathy, cervical region: Secondary | ICD-10-CM

## 2021-11-20 NOTE — Progress Notes (Signed)
Patient: Kristine [name]  Service Category: E/M  Provider: Gillis Santa, MD  DOB: 1971-10-23  DOS: 11/20/2021  Location: Office  MRN: [id number]  Setting: Ambulatory outpatient  Referring Provider: Gladstone Lighter, MD  Type: Established Patient  Specialty: Interventional Pain Management  PCP: Gladstone Lighter, MD  Location: Remote location  Delivery: TeleHealth     Virtual Encounter - Pain Management PROVIDER NOTE: Information contained herein reflects review and annotations entered in association with encounter. Interpretation of such information and data should be left to medically-trained personnel. Information provided to patient can be located elsewhere in the medical record under "Patient Instructions". Document created using STT-dictation technology, any transcriptional errors that may result from process are unintentional.    Contact & Pharmacy Preferred: [phone] Home: [phone] (home) Mobile: [phone] (mobile) E-mail: [email or web address]  [facility], [place] to [address] [place] [address] Phone: [phone] Fax: [place], [place] - [address] [place] [address] Phone: [phone] Fax: [phone], [facility] [address], [address] [place] [address] Phone: [phone] Fax: [phone]  [place], [place] [address] Phone: [phone] Fax: [phone]   Pre-screening  Kristine [name] offered "in-person" vs "virtual" encounter. She indicated preferring virtual for this encounter.   Reason COVID-19*   Social distancing based on CDC and AMA recommendations.   I contacted Kristine [name] on 11/20/2021 via telephone.      I clearly  identified myself as Gillis Santa, MD. I verified that I was speaking with the correct person using two identifiers (Name: Kristine [name], and date of birth: 22-Sep-1971).  Consent I sought verbal advanced consent from Kristine [name] for virtual visit interactions. I informed Kristine [name] of possible security and privacy concerns, risks, and limitations associated with providing "not-in-person" medical evaluation and management services. I also informed Ms. Sease of the availability of "in-person" appointments. Finally, I informed her that there would be a charge for the virtual visit and that she could be  personally, fully or partially, financially responsible for it. Kristine [name] expressed understanding and agreed to proceed.   Historic Elements   Kristine [name] is a 51 y.o. year old, female patient evaluated today after our last contact on 10/16/2021. Kristine [name]  has a past medical history of Abnormal weight gain (08/05/2013), Absence of interventricular septum (08/05/2013), Allergic rhinitis (04/17/2016), Allergic urticaria due to ingested food (02/09/2016), Anxiety, Asthma, Cancer ([place]), Clinical depression (99/83/3825), Complication of anesthesia, Depression, Dizziness, Excess weight (09/30/2013), GERD (gastroesophageal reflux disease), Headache, migraine (09/30/2013), Heart murmur, Heavy drinker (04/11/2014), History of methicillin resistant staphylococcus aureus (MRSA) (11/2015), Infection of urinary tract (09/30/2013), Irregular bleeding (09/30/2013), Multiple allergies, and VSD (ventricular septal defect and aortic arch hypoplasia. She also  has a past surgical history that includes Nasal sinus surgery; Colonoscopy; Cystoscopy with hydrodistension and biopsy; Cesarean section; Laparoscopic assisted vaginal hysterectomy (N/A, 08/20/2016); Laparoscopic bilateral salpingectomy (Bilateral, 08/20/2016); Coronary angioplasty; Cystoscopy (N/A, 10/23/2016); Botox injection (N/A, 10/23/2016); Colonoscopy with  propofol (N/A, 07/04/2020); Abdominal hysterectomy; Breast biopsy (Left, 11/13/2013); Cosmetic surgery (06/09/2021); and Cosmetic surgery. Kristine [name] has a current medication list which includes the following prescription(s): albuterol, alprazolam, atrovent hfa, azelastine, biotin, budesonide, wynzora, cetirizine, cosentyx sensoready pen, cranberry, eucrisa,  desvenlafaxine, dibucaine, epinephrine, fexofenadine, ipratropium, levocetirizine, levothyroxine, montelukast, nitrofurantoin (macrocrystal-monohydrate), omalizumab, ondansetron, otezla, polyethylene glycol, potassium chloride, relion pen needles, spiriva respimat, vitamin b-12, vitamin c, azelastine, azelastine hcl, botox, clobetasol ointment, cyanocobalamin, fluticasone, fluticasone, fluticasone-salmeterol, ajovy, hydrochlorothiazide, ipratropium, magnesium oxide, mometasone, multiple vitamins-minerals, chewable vite childrens, prednisone, and triamcinolone cream. She  reports that she quit smoking about 15 years ago. Her smoking use included cigarettes. She has never used smokeless tobacco. She reports that she does not drink alcohol and does not use drugs. Kristine [name] is allergic to bee pollen, bee venom, eggs or egg-derived products, keflex [cephalexin], lac bovis, milk-related compounds, penicillins, shellfish allergy, soy allergy, tilactase, topamax [topiramate], wheat bran, butalbital-apap-caffeine, codeine, ferrous gluconate, gabapentin, hydrocodone, iron, meclizine, red dye, iodine, and latex.   HPI  Today, she is being contacted for a post-procedure assessment.   Post-procedure evaluation    Procedure: Interlaminar Cervical Epidural Steroid injection (ESI) Laterality: Right Level: C7-T1 Analgesia: Local anesthesia Sedation: minimal anxiolysis.IV versed   Imaging: Fluoroscopy-assisted  Purpose: Diagnostic/Therapeutic Indications: Cervicalgia, cervical radicular pain, degenerative disc disease, severe enough to impact quality of life or  function.  NAS-11 score:   Pre-procedure: 7 /10   Post-procedure: 5 /10      Effectiveness:  Initial hour after procedure: 100 %  Subsequent 4-6 hours post-procedure: 100 %  Analgesia past initial 6 hours: 85 % (ongoing)  Ongoing improvement:  Analgesic:  <50%, up until last Tues when she went  to gym and felt like she aggravated her neck) Function: Kristine [name] reports improvement in function ROM: Kristine [name] reports improvement in ROM   Laboratory Chemistry Profile   Renal Lab Results  Component Value Date   BUN 17 09/19/2017   CREATININE 0.85 09/19/2017   BCR 27 (H) 04/17/2016   GFRAA >60 09/19/2017   GFRNONAA >60 09/19/2017    Hepatic Lab Results  Component Value Date   AST 27 09/04/2016   ALT 32 09/04/2016   ALBUMIN 4.1 09/04/2016   ALKPHOS 44 09/04/2016    Electrolytes Lab Results  Component Value Date   NA 137 09/19/2017   K 3.2 (L) 09/19/2017   CL 96 (L) 09/19/2017   CALCIUM 9.1 09/19/2017   MG 2.1 04/09/2017    Bone No results found for: VD25OH, VD125OH2TOT, SH7026VZ8, HY8502DX4, 25OHVITD1, 25OHVITD2, 25OHVITD3, TESTOFREE, TESTOSTERONE  Inflammation (CRP: Acute Phase) (ESR: Chronic Phase) Lab Results  Component Value Date   ESRSEDRATE 11 09/04/2016         Note: Above Lab results reviewed.   Assessment  The primary encounter diagnosis was Cervical radicular pain (R>L). Diagnoses of Spinal stenosis in cervical region (severe C5/6), Neuroforaminal stenosis of cervical spine (C5/6), Cervical spondylosis, and Chronic pain syndrome were also pertinent to this visit.  Plan of Care   Patient states that she was doing very well after her right C7-T1 epidural steroid injection on 10/16/2021.  Was endorsing approximately 85% pain relief and improvement in her cervical range of motion until last Tuesday when she went to the gym was working out and felt that she aggravated her neck.  Since then, she has been having increased cervical spine pain that is radiating  into her right arm similar in distribution and intensity to have it was prior to her cervical epidural steroid injection.  We discussed repeating right cervical ESI with IV Versed as she did previously.  Risks and benefits reviewed and patient would like to proceed.   Orders:  Orders Placed This Encounter  Procedures   Cervical Epidural Injection  Sedation: IV Versed Purpose: Diagnostic/Therapeutic Indication(s): Radiculitis and cervicalgia associater with cervical degenerative disc disease.    Standing Status:   Future    Standing Expiration Date:   02/18/2022    Scheduling Instructions:     Procedure: Cervical Epidural Steroid Injection/Block     Level(s): C7-T1     Laterality: TBD     Timeframe: As soon as schedule allows    Order Specific Question:   Where will this procedure be performed?    Answer:   [facility]    Comments:   Keneth Borg   Follow-up plan:   Return in about 1 week (around 11/27/2021) for R C7-T1 ESI (IV Versed in clinic).     C7/T1 ESI 10/16/21    Recent Visits Date Type Provider Dept  10/16/21 Procedure visit Gillis Santa, MD [facility]  10/11/21 Office Visit Gillis Santa, MD [facility]  Showing recent visits within past 90 days and meeting all other requirements Today's Visits Date Type Provider Dept  11/20/21 Office Visit Gillis Santa, MD [facility]  Showing today's visits and meeting all other requirements Future Appointments No visits were found meeting these conditions. Showing future appointments within next 90 days and meeting all other requirements  I discussed the assessment and treatment plan with the patient. The patient was provided an opportunity to ask questions and all were answered. The patient agreed with the plan and demonstrated an understanding of the instructions.  Patient advised to call back or seek an in-person evaluation if the symptoms or condition worsens.  Duration of encounter: 30  minutes.  Note by: Gillis Santa, MD Date: 11/20/2021; Time: 3:09 PM

## 2021-11-21 NOTE — Patient Instructions (Signed)
______________________________________________________________________  Preparing for Procedure with Sedation  NOTICE: Due to recent regulatory changes, starting on June 12, 2021, procedures requiring intravenous (IV) sedation will no longer be performed at the [facility].  These types of procedures are required to be performed at [facility] ambulatory surgery facility.  We are very sorry for the inconvenience.  Procedure appointments are limited to planned procedures: No Prescription Refills. No disability issues will be discussed. No medication changes will be discussed.  Instructions: Oral Intake: Do not eat or drink anything for at least 8 hours prior to your procedure. (Exception: Blood Pressure Medication. See below.) Transportation: A driver is required. You may not drive yourself after the procedure. Blood Pressure Medicine: Do not forget to take your blood pressure medicine with a sip of water the morning of the procedure. If your Diastolic (lower reading) is above 100 mmHg, elective cases will be cancelled/rescheduled. Blood thinners: These will need to be stopped for procedures. Notify our staff if you are taking any blood thinners. Depending on which one you take, there will be specific instructions on how and when to stop it. Diabetics on insulin: Notify the staff so that you can be scheduled 1st case in the morning. If your diabetes requires high dose insulin, take only  of your normal insulin dose the morning of the procedure and notify the staff that you have done so. Preventing infections: Shower with an antibacterial soap the morning of your procedure. Build-up your immune system: Take 1000 mg of Vitamin C with every meal (3 times a day) the day prior to your procedure. Antibiotics: Inform the staff if you have a condition or reason that requires you to take antibiotics before dental procedures. Pregnancy: If you are pregnant, call and cancel the procedure. Sickness: If  you have a cold, fever, or any active infections, call and cancel the procedure. Arrival: You must be in the facility at least 30 minutes prior to your scheduled procedure. Children: Do not bring children with you. Dress appropriately: Bring dark clothing that you would not mind if they get stained. Valuables: Do not bring any jewelry or valuables.  Reasons to call and reschedule or cancel your procedure: (Following these recommendations will minimize the risk of a serious complication.) Surgeries: Avoid having procedures within 2 weeks of any surgery. (Avoid for 2 weeks before or after any surgery). Flu Shots: Avoid having procedures within 2 weeks of a flu shots. (Avoid for 2 weeks before or after immunizations). Barium: Avoid having a procedure within 7-10 days after having had a radiological study involving the use of radiological contrast. (Myelograms, Barium swallow or enema study). Heart attacks: Avoid any elective procedures or surgeries for the initial 6 months after a "Myocardial Infarction" (Heart Attack). Blood thinners: It is imperative that you stop these medications before procedures. Let us know if you if you take any blood thinner.  Infection: Avoid procedures during or within two weeks of an infection (including chest colds or gastrointestinal problems). Symptoms associated with infections include: Localized redness, fever, chills, night sweats or profuse sweating, burning sensation when voiding, cough, congestion, stuffiness, runny nose, sore throat, diarrhea, nausea, vomiting, cold or Flu symptoms, recent or current infections. It is specially important if the infection is over the area that we intend to treat. Heart and lung problems: Symptoms that may suggest an active cardiopulmonary problem include: cough, chest pain, breathing difficulties or shortness of breath, dizziness, ankle swelling, uncontrolled high or unusually low blood pressure, and/or palpitations. If you are    experiencing any of these symptoms, cancel your procedure and contact your primary care physician for an evaluation.  Remember:  Regular Business hours are:  Monday to Thursday 8:00 AM to 4:00 PM  Provider's Schedule: Francisco Naveira, MD:  Procedure days: Tuesday and Thursday 7:30 AM to 4:00 PM  Bilal Lateef, MD:  Procedure days: Monday and Wednesday 7:30 AM to 4:00 PM ______________________________________________________________________   

## 2021-11-28 ENCOUNTER — Telehealth: Payer: Self-pay

## 2021-11-28 NOTE — Telephone Encounter (Signed)
The patients insurance is telling her that Dr. Holley Raring is out of network with them but Dr. Andree Elk is in network which makes no sense but that's what it is. She wants to know if Dr. Andree Elk can do the procedure for her. I told her I would need to discuss with both doctors and let her know.

## 2021-11-30 ENCOUNTER — Telehealth: Payer: Self-pay | Admitting: Student in an Organized Health Care Education/Training Program

## 2021-11-30 NOTE — Telephone Encounter (Signed)
Patient calling to see if you have heard from insurance for approval on a procedure

## 2021-12-18 ENCOUNTER — Ambulatory Visit
Admission: RE | Admit: 2021-12-18 | Discharge: 2021-12-18 | Disposition: A | Discharge status: Home / Self Care | Payer: BC Managed Care – PPO | Source: Ambulatory Visit | Attending: Student in an Organized Health Care Education/Training Program | Admitting: Student in an Organized Health Care Education/Training Program

## 2021-12-18 ENCOUNTER — Ambulatory Visit (HOSPITAL_BASED_OUTPATIENT_CLINIC_OR_DEPARTMENT_OTHER): Payer: BC Managed Care – PPO | Admitting: Student in an Organized Health Care Education/Training Program

## 2021-12-18 ENCOUNTER — Encounter: Payer: Self-pay | Admitting: Student in an Organized Health Care Education/Training Program

## 2021-12-18 ENCOUNTER — Other Ambulatory Visit: Payer: Self-pay

## 2021-12-18 DIAGNOSIS — G894 Chronic pain syndrome: Secondary | ICD-10-CM | POA: Insufficient documentation

## 2021-12-18 DIAGNOSIS — M4802 Spinal stenosis, cervical region: Secondary | ICD-10-CM | POA: Insufficient documentation

## 2021-12-18 DIAGNOSIS — M5412 Radiculopathy, cervical region: Secondary | ICD-10-CM | POA: Diagnosis present

## 2021-12-18 MED ORDER — IOHEXOL 180 MG/ML  SOLN
10.0000 mL | Freq: Once | INTRAMUSCULAR | Status: AC
  Administered 2021-12-18: 10 mL via EPIDURAL
  Filled 2021-12-18: qty 20

## 2021-12-18 MED ORDER — SODIUM CHLORIDE (PF) 0.9 % IJ SOLN
INTRAMUSCULAR | Status: AC
  Filled 2021-12-18: qty 10

## 2021-12-18 MED ORDER — ROPIVACAINE HCL 2 MG/ML IJ SOLN
1.0000 mL | Freq: Once | INTRAMUSCULAR | Status: AC
  Administered 2021-12-18: 1 mL via EPIDURAL

## 2021-12-18 MED ORDER — LIDOCAINE HCL 2 % IJ SOLN
20.0000 mL | Freq: Once | INTRAMUSCULAR | Status: AC
  Administered 2021-12-18: 400 mg
  Filled 2021-12-18: qty 20

## 2021-12-18 MED ORDER — DEXAMETHASONE SODIUM PHOSPHATE 10 MG/ML IJ SOLN
10.0000 mg | Freq: Once | INTRAMUSCULAR | Status: AC
  Administered 2021-12-18: 10 mg
  Filled 2021-12-18: qty 1

## 2021-12-18 MED ORDER — ROPIVACAINE HCL 2 MG/ML IJ SOLN
INTRAMUSCULAR | Status: AC
  Filled 2021-12-18: qty 20

## 2021-12-18 MED ORDER — MIDAZOLAM HCL 5 MG/5ML IJ SOLN
0.5000 mg | Freq: Once | INTRAMUSCULAR | Status: AC
  Administered 2021-12-18: 2 mg via INTRAVENOUS
  Filled 2021-12-18: qty 5

## 2021-12-18 MED ORDER — SODIUM CHLORIDE 0.9% FLUSH
1.0000 mL | Freq: Once | INTRAVENOUS | Status: AC
  Administered 2021-12-18: 1 mL

## 2021-12-18 NOTE — Patient Instructions (Signed)

## 2021-12-18 NOTE — Progress Notes (Signed)
Safety precautions to be maintained throughout the outpatient stay will include: orient to surroundings, keep bed in low position, maintain call bell within reach at all times, provide assistance with transfer out of bed and ambulation.  

## 2021-12-18 NOTE — Progress Notes (Signed)
PROVIDER NOTE: Interpretation of information contained herein should be left to medically-trained personnel. Specific patient instructions are provided elsewhere under "Patient Instructions" section of medical record. This document was created in part using STT-dictation technology, any transcriptional errors that may result from this process are unintentional.  Patient: Kristine [name] Type: Established DOB: [birth year] MRN: [id number] PCP: Gladstone Lighter, MD  Service: Procedure DOS: 12/18/2021 Setting: Ambulatory Location: Ambulatory outpatient facility Delivery: Face-to-face Provider: Gillis Santa, MD Specialty: Interventional Pain Management Specialty designation: 09 Location: Outpatient facility Ref. Prov.: Gillis Santa, MD   Procedure [facility] Interventional Pain Management )    Procedure: Interlaminar Cervical Epidural Steroid injection (ESI) #2 (#1 done 10/16/21) Laterality: Right Level: C7-T1 Analgesia: Local anesthesia Sedation: minimal anxiolysis.IV versed   Imaging: Fluoroscopy-assisted  Purpose: Diagnostic/Therapeutic Indications: Cervicalgia, cervical radicular pain, degenerative disc disease, severe enough to impact quality of life or function.  NAS-11 score:   Pre-procedure: 8 /10   Post-procedure: 1 /10     1. Cervical radicular pain (R>L)   2. Neuroforaminal stenosis of cervical spine (C5/6)   3. Chronic pain syndrome    Pre-Procedure Preparation  Monitoring: As per clinic protocol. Respiration, ETCO2, SpO2, BP, heart rate and rhythm monitor placed and checked for adequate function  Risk Assessment: Vitals:  TDH:RCBULAGTX body mass index is 28.35 kg/m as calculated from the following:   Height as of this encounter: 5\' 2"  (1.575 m).   Weight as of this encounter: 155 lb (70.3 kg)., Rate:78ECG Heart Rate: 74, BP:116/67, Resp:18, Temp:(!) 97 F (36.1 C), SpO2:99 %  Allergies: She is allergic to bee pollen, bee venom, eggs or egg-derived products, keflex  [cephalexin], lac bovis, milk-related compounds, penicillins, shellfish allergy, soy allergy, tilactase, topamax [topiramate], wheat bran, butalbital-apap-caffeine, codeine, ferrous gluconate, gabapentin, hydrocodone, iron, meclizine, red dye, iodine, and latex.  Precautions: Latex-free protocol activated  Blood-thinner(s): None at this time  Coagulopathies: Reviewed. None identified.   Active Infection(s): Reviewed. None identified. Kristine [name] is afebrile   Location setting: Procedure suite Position: Prone, on modified reverse trendelenburg to facilitate breathing, with head in head-cradle. Pillows positioned under chest (below chin-level) with cervical spine flexed. Safety Precautions: Patient was assessed for positional comfort and pressure points before starting the procedure. Prepping solution: DuraPrep (Iodine Povacrylex [0.7% available iodine] and Isopropyl Alcohol, 74% w/w) Prep Area: Entire  cervicothoracic region Approach: percutaneous, paramedial Intended target: Posterior cervical epidural space Materials: Tray: Epidural Needle(s): Epidural (Tuohy) Qty: 1 Length: (55mm) 3.5-inch Gauge: 22G  4 cc solution made of 2 cc of preservative-free saline, 1 cc of 0.2% ropivacaine, 1 cc of Decadron 10 mg/cc.    Meds ordered this encounter  Medications   iohexol (OMNIPAQUE) 180 MG/ML injection 10 mL    Must be Myelogram-compatible. If not available, you may substitute with a water-soluble, non-ionic, hypoallergenic, myelogram-compatible radiological contrast medium.   lidocaine (XYLOCAINE) 2 % (with pres) injection 400 mg   midazolam (VERSED) 5 MG/5ML injection 0.5-2 mg    Make sure Flumazenil is available in the pyxis when using this medication. If oversedation occurs, administer 0.2 mg IV over 15 sec. If after 45 sec no response, administer 0.2 mg again over 1 min; may repeat at 1 min intervals; not to exceed 4 doses (1 mg)   ropivacaine (PF) 2 mg/mL (0.2%) (NAROPIN) injection 1 mL    sodium chloride flush (NS) 0.9 % injection 1 mL   dexamethasone (DECADRON) injection 10 mg    Orders Placed This Encounter  Procedures   DG PAIN CLINIC C-ARM 1-60 MIN  NO REPORT    Intraoperative interpretation by procedural physician at [place].    Standing Status:   Standing    Number of Occurrences:   1    Order Specific Question:   Reason for exam:    Answer:   Assistance in needle guidance and placement for procedures requiring needle placement in or near specific anatomical locations not easily accessible without such assistance.     Time-out: 1033 I initiated and conducted the "Time-out" before starting the procedure, as per protocol. The patient was asked to participate by confirming the accuracy of the "Time Out" information. Verification of the correct person, site, and procedure were performed and confirmed by me, the nursing staff, and the patient. "Time-out" conducted as per Joint Commission's Universal Protocol (UP.01.01.01). Procedure checklist: Completed   H&P (Pre-op  Assessment)  Kristine [name] is a 51 y.o. (year old), female patient, seen today for interventional treatment. She  has a past surgical history that includes Nasal sinus surgery; Colonoscopy; Cystoscopy with hydrodistension and biopsy; Cesarean section; Laparoscopic assisted vaginal hysterectomy (N/A, 08/20/2016); Laparoscopic bilateral salpingectomy (Bilateral, 08/20/2016); Coronary angioplasty; Cystoscopy (N/A, 10/23/2016); Botox injection (N/A, 10/23/2016); Colonoscopy with propofol (N/A, 07/04/2020); Abdominal hysterectomy; Breast biopsy (Left, 11/13/2013); Cosmetic surgery (06/09/2021); and Cosmetic surgery. Kristine [name] has a current medication list which includes the following prescription(s): albuterol, alprazolam, atrovent hfa, azelastine, biotin, wynzora, cetirizine, cosentyx sensoready pen, cranberry, eucrisa, desvenlafaxine, dibucaine, epinephrine, fexofenadine, ipratropium, levocetirizine,  levothyroxine, montelukast, nitrofurantoin (macrocrystal-monohydrate), omalizumab, ondansetron, otezla, polyethylene glycol, potassium chloride, relion pen needles, spiriva respimat, vitamin b-12, vitamin c, azelastine, azelastine hcl, botox, budesonide, clobetasol ointment, cyanocobalamin, fluticasone, fluticasone, fluticasone-salmeterol, ajovy, hydrochlorothiazide, ipratropium, magnesium oxide, mometasone, multiple vitamins-minerals, ozempic (2 mg/dose), chewable vite childrens, prednisone, and triamcinolone cream. Her primarily concern today is the Neck Pain   She is allergic to bee pollen, bee venom, eggs or egg-derived products, keflex [cephalexin], lac bovis, milk-related compounds, penicillins, shellfish allergy, soy allergy, tilactase, topamax [topiramate], wheat bran, butalbital-apap-caffeine, codeine, ferrous gluconate, gabapentin, hydrocodone, iron, meclizine, red dye, iodine, and latex.   Last encounter: My last encounter with her was on 10/11/2021. Pertinent problems: Kristine [name] does not have any pertinent problems on file. Pain Assessment: Severity of Chronic pain is reported as a 8 /10. Location: Neck Right, Left, Posterior/Radaites from back of neck into back of head down to shoulder bilateral and down to arms bilateral.. Onset:  . Quality:  . Timing:  . Modifying factor(s): Advil, heating pad, and ice bu it does not help much. Vitals:  height is 5\' 2"  (1.575 m) and weight is 155 lb (70.3 kg). Her temporal temperature is 97 F (36.1 C) (abnormal). Her blood pressure is 106/59 (abnormal) and her pulse is 78. Her respiration is 20 and oxygen saturation is 96%.   Reason for encounter: Interventional pain management therapy due pain of at least four (4) weeks in duration, with to failure to respond to and/or inability to tolerate more conservative care.   Related imaging: Cervical MR wo contrast:  Results for orders placed during the hospital encounter of 08/18/21  MR CERVICAL SPINE WO  CONTRAST  Narrative CLINICAL DATA:  Cervicalgia. Additional history provided: Patient reports chronic neck pain for greater than 10 years, no known injury. Associated occipital headaches.  EXAM: MRI CERVICAL SPINE WITHOUT CONTRAST  TECHNIQUE: Multiplanar, multisequence MR imaging of the cervical spine was performed. No intravenous contrast was administered.  COMPARISON:  No pertinent prior exams available for comparison.  FINDINGS: Alignment: Straightening of the expected cervical lordosis. Mild cervical  levocurvature, possibly positional. No significant spondylolisthesis.  Vertebrae: Vertebral body height is maintained. Trace degenerative endplate edema at C6-C3.  Cord: No spinal cord signal abnormality is identified. Spinal cord flattening at C5-C6, as described below.  Posterior Fossa, vertebral arteries, paraspinal tissues: No abnormality identified within included portions of the posterior fossa. Flow voids preserved within the imaged cervical vertebral arteries. Paraspinal soft tissues unremarkable.  Disc levels:  Mild-to-moderate disc degeneration at C5-C6. No more than mild disc degeneration at the remaining levels.  C2-C3: Mild facet arthrosis on the right. No significant disc herniation or stenosis.  C3-C4: Uncovertebral hypertrophy on the right. Mild facet arthrosis. No significant spinal canal stenosis. Mild right neural foraminal narrowing.  C4-C5: Shallow disc bulge. Minimal bilateral uncovertebral hypertrophy. No significant spinal canal or foraminal stenosis.  C5-C6: Posterior disc osteophyte complex with bilateral disc osteophyte ridge/uncinate hypertrophy. Severe spinal canal stenosis with moderate spinal cord flattening. No appreciable spinal cord signal abnormality. Severe bilateral neural foraminal narrowing.  C6-C7: Shallow disc bulge. Mild uncovertebral hypertrophy. No significant spinal canal or foraminal stenosis.  C7-T1: Mild facet  arthrosis. No significant disc herniation or stenosis.  IMPRESSION: Cervical spondylosis, as outlined and with findings most notably as follows.  At C5-C6, there is mild-to-moderate disc degeneration with trace degenerative endplate edema. Posterior disc osteophyte complex with bilateral disc osteophyte ridge/uncinate hypertrophy. Severe spinal canal stenosis with moderate spinal cord flattening. Severe bilateral neural foraminal narrowing.  No significant spinal canal stenosis at the remaining levels. Uncovertebral hypertrophy contributes to mild right neural foraminal narrowing at C3-C4.  Straightening of the expected cervical lordosis.  Mild cervical levocurvature, possibly positional.   Electronically Signed By: Kellie Simmering D.O. On: 08/18/2021 18:17  Cervical MR wo contrast: No valid procedures specified.    Site Confirmation: Kristine [name] was asked to confirm the procedure and laterality before marking the site.  Consent: Before the procedure and under the influence of no sedative(s), amnesic(s), or anxiolytics, the patient was informed of the treatment options, risks and possible complications. To fulfill our ethical and legal obligations, as recommended by the American Medical Association's Code of Ethics, I have informed the patient of my clinical impression; the nature and purpose of the treatment or procedure; the risks, benefits, and possible complications of the intervention; the alternatives, including doing nothing; the risk(s) and benefit(s) of the alternative treatment(s) or procedure(s); and the risk(s) and benefit(s) of doing nothing. The patient was provided information about the general risks and possible complications associated with the procedure. These may include, but are not limited to: failure to achieve desired goals, infection, bleeding, organ or nerve damage, allergic reactions, paralysis, and death. In addition, the patient was informed of those risks and  complications associated to Spine-related procedures, such as failure to decrease pain; infection (i.e.: Meningitis, epidural or intraspinal abscess); bleeding (i.e.: epidural hematoma, subarachnoid hemorrhage, or any other type of intraspinal or peri-dural bleeding); organ or nerve damage (i.e.: Any type of peripheral nerve, nerve root, or spinal cord injury) with subsequent damage to sensory, motor, and/or autonomic systems, resulting in permanent pain, numbness, and/or weakness of one or several areas of the body; allergic reactions; (i.e.: anaphylactic reaction); and/or death. Furthermore, the patient was informed of those risks and complications associated with the medications. These include, but are not limited to: allergic reactions (i.e.: anaphylactic or anaphylactoid reaction(s)); adrenal axis suppression; blood sugar elevation that in diabetics may result in ketoacidosis or comma; water retention that in patients with history of congestive heart failure may result in  shortness of breath, pulmonary edema, and decompensation with resultant heart failure; weight gain; swelling or edema; medication-induced neural toxicity; particulate matter embolism and blood vessel occlusion with resultant organ, and/or nervous system infarction; and/or aseptic necrosis of one or more joints. Finally, the patient was informed that Medicine is not an exact science; therefore, there is also the possibility of unforeseen or unpredictable risks and/or possible complications that may result in a catastrophic outcome. The patient indicated having understood very clearly. We have given the patient no guarantees and we have made no promises. Enough time was given to the patient to ask questions, all of which were answered to the patient's satisfaction. Kristine [name] has indicated that she wanted to continue with the procedure. Attestation: I, the ordering provider, attest that I have discussed with the patient the benefits, risks,  side-effects, alternatives, likelihood of achieving goals, and potential problems during recovery for the procedure that I have provided informed consent.  Date   Time: 12/18/2021  9:58 AM    Description of procedure   Start Time: 1033 hrs  Local Anesthesia: Once the patient was positioned, prepped, and time-out was completed. The target area was identified located. The skin was marked with an approved surgical skin marker. Once marked, the skin (epidermis, dermis, and hypodermis), and deeper tissues (fat, connective tissue and muscle) were infiltrated with a small amount of a short-acting local anesthetic, loaded on a 10cc syringe with a 25G, 1.5-in  Needle. An appropriate amount of time was allowed for local anesthetics to take effect before proceeding to the next step. Local Anesthetic: Lidocaine 1-2% The unused portion of the local anesthetic was discarded in the proper designated containers. Safety Precautions: Aspiration looking for blood return was conducted prior to all injections. At no point did I inject any substances, as a needle was being advanced. Before injecting, the patient was told to immediately notify me if she was experiencing any new onset of "ringing in the ears, or metallic taste in the mouth". No attempts were made at seeking any paresthesias. Safe injection practices and needle disposal techniques used. Medications properly checked for expiration dates. SDV (single dose vial) medications used. After the completion of the procedure, all disposable equipment used was discarded in the proper designated medical waste containers.  Technical description: Protocol guidelines were followed. Using fluoroscopic guidance, the epidural needle was introduced through the skin, ipsilateral to the reported pain, and advanced to the target area. Posterior laminar os was contacted and the needle walked caudad, until the lamina was cleared. The ligamentum flavum was engaged and the epidural space  identified using loss-of-resistance technique with 2-3 ml of PF-NaCl (0.9% NSS), in a 5cc dedicated LOR syringe. See "Imaging guidance" below for use of contrast details.  Injection: Once satisfactory needle placement was confirmed, I proceeded to inject the desired solution in slow, incremental fashion, intermittently assessing for discomfort or any signs of abnormal or undesired spread of substance. Once completed, the needle was removed and disposed of, as per hospital protocols.   Vitals:   12/18/21 1034 12/18/21 1036 12/18/21 1041 12/18/21 1045  BP: 115/72 106/63 102/83 (!) 106/59  Pulse:      Resp: (!) 24 (!) 22 (!) 24 20  Temp:      TempSrc:      SpO2: 94% 97% 95% 96%  Weight:      Height:        End Time: 1040 hrs  Once the entire procedure was completed, the treated area was cleaned, making  sure to leave some of the prepping solution back to take advantage of its long term bactericidal properties.   Imaging guidance  Type of Imaging Technique: Fluoroscopy Guidance (Spinal) Indication(s): Assistance in needle guidance and placement for procedures requiring needle placement in or near specific anatomical locations not easily accessible without such assistance. Exposure Time: Please see nurses notes for exact fluoroscopy time. Contrast: Before injecting any contrast, we confirmed that the patient did not have an allergy to iodine, shellfish, or radiological contrast. Once satisfactory needle placement was completed, radiological contrast was injected under continuous fluoroscopic guidance. Injection of contrast accomplished without complications. See chart for type and volume of contrast used. Fluoroscopic Guidance: I was personally present in the fluoroscopy suite, where the patient was placed in position for the procedure, over the fluoroscopy-compatible table. Fluoroscopy was manipulated, using "Tunnel Vision Technique", to obtain the best possible view of the target area, on the  affected side. Parallax error was corrected before commencing the procedure. A "direction-depth-direction" technique was used to introduce the needle under continuous pulsed fluoroscopic guidance. Once the target was reached, antero-posterior, oblique, and lateral fluoroscopic projection views were taken to confirm needle placement in all planes. Electronic images uploaded into EMR.  Interpretation: Successful epidural injection. Intraoperative imaging interpretation by performing Physician.    Post-op assessment  Post-procedure Vital Signs:  Pulse/HCG Rate: 7877 (NSR) Temp: (!) 97 F (36.1 C) Resp: 20 BP: (!) 106/59 SpO2: 96 %  EBL: None  Complications: No immediate post-treatment complications observed by team, or reported by patient.  Note: The patient tolerated the entire procedure well. A repeat set of vitals were taken after the procedure and the patient was kept under observation following institutional policy, for this type of procedure. Post-procedural neurological assessment was performed, showing return to baseline, prior to discharge. The patient was provided with post-procedure discharge instructions, including a section on how to identify potential problems. Should any problems arise concerning this procedure, the patient was given instructions to immediately contact us, at any time, without hesitation. In any case, we plan to contact the patient by telephone for a follow-up status report regarding this interventional procedure.  Comments:  No additional relevant information.   5 out of 5 strength bilateral upper extremity: Shoulder abduction, elbow flexion, elbow extension, thumb extension.   Plan of care    Medications administered: We administered iohexol, lidocaine, midazolam, ropivacaine (PF) 2 mg/mL (0.2%), sodium chloride flush, and dexamethasone.  Follow-up plan:   Return in about 4 weeks (around 01/15/2022) for Post Procedure Evaluation, virtual.      C7/T1 ESI  10/16/21 (3 cc), 12/18/21 (4 cc)   Recent Visits Date Type Provider Dept  11/20/21 Office Visit Gillis Santa, MD [facility]  10/16/21 Procedure visit Gillis Santa, MD [facility]  10/11/21 Office Visit Gillis Santa, MD [facility]  Showing recent visits within past 90 days and meeting all other requirements Today's Visits Date Type Provider Dept  12/18/21 Procedure visit Gillis Santa, MD [facility]  Showing today's visits and meeting all other requirements Future Appointments No visits were found meeting these conditions. Showing future appointments within next 90 days and meeting all other requirements   Disposition: Discharge home  Discharge (Date   Time): 12/18/2021;   hrs.   Primary Care Physician: Gladstone Lighter, MD Location: [facility] Note by: Gillis Santa, MD Date: 12/18/2021; Time: 10:53 AM  DISCLAIMER: Medicine is not an exact science. It has no guarantees or warranties. The decision  to proceed with this intervention was based on the information collected from the patient. Conclusions were drawn from the patient's questionnaire, interview, and examination. Because information was provided in large part by the patient, it cannot be guaranteed that it has not been purposely or unconsciously manipulated or altered. Every effort has been made to obtain as much accurate, relevant, available data as possible. Always take into account that the treatment will also be dependent on availability of resources and existing treatment guidelines, considered by other Pain Management Specialists as being common knowledge and practice, at the time of the intervention. It is also important to point out that variation in procedural techniques and pharmacological choices are the acceptable norm. For Medico-Legal review purposes, the indications, contraindications, technique, and results of the these procedures should only be  evaluated, judged and interpreted by a Board-Certified Interventional Pain Specialist with extensive familiarity and expertise in the same exact procedure and technique.

## 2021-12-18 NOTE — Progress Notes (Signed)
1051 Dr Holley Raring in to assess patient. Kristine [name] able to move are up to about shoulder level without pain.

## 2021-12-19 ENCOUNTER — Telehealth: Payer: Self-pay

## 2021-12-19 NOTE — Telephone Encounter (Signed)
Post procedure phone call.  LM 

## 2022-01-16 ENCOUNTER — Encounter: Payer: Self-pay | Admitting: Student in an Organized Health Care Education/Training Program

## 2022-01-17 ENCOUNTER — Ambulatory Visit
Payer: BC Managed Care – PPO | Attending: Student in an Organized Health Care Education/Training Program | Admitting: Student in an Organized Health Care Education/Training Program

## 2022-01-17 ENCOUNTER — Other Ambulatory Visit: Payer: Self-pay

## 2022-01-17 DIAGNOSIS — M4802 Spinal stenosis, cervical region: Secondary | ICD-10-CM | POA: Diagnosis not present

## 2022-01-17 DIAGNOSIS — G894 Chronic pain syndrome: Secondary | ICD-10-CM | POA: Diagnosis not present

## 2022-01-17 DIAGNOSIS — M47812 Spondylosis without myelopathy or radiculopathy, cervical region: Secondary | ICD-10-CM | POA: Diagnosis not present

## 2022-01-17 DIAGNOSIS — M5412 Radiculopathy, cervical region: Secondary | ICD-10-CM

## 2022-01-17 NOTE — Progress Notes (Signed)
Patient: Kristine [name]  Service Category: E/M  Provider: Gillis Santa, MD  DOB: Dec 22, 1970  DOS: 01/17/2022  Location: Office  MRN: [id number]  Setting: Ambulatory outpatient  Referring Provider: Gladstone Lighter, MD  Type: Established Patient  Specialty: Interventional Pain Management  PCP: Gladstone Lighter, MD  Location: Remote location  Delivery: TeleHealth     Virtual Encounter - Pain Management PROVIDER NOTE: Information contained herein reflects review and annotations entered in association with encounter. Interpretation of such information and data should be left to medically-trained personnel. Information provided to patient can be located elsewhere in the medical record under "Patient Instructions". Document created using STT-dictation technology, any transcriptional errors that may result from process are unintentional.    Contact & Pharmacy Preferred: [phone] Home: [phone] (home) Mobile: [phone] (mobile) E-mail: [email or web address]  [facility], [place] [address] [place] [address] Phone: [phone] Fax: [place], [place] - [address] [place] [address] Phone: [phone] Fax: [phone], [place], [address] [address] Phone: [phone] Fax: [phone]  [place], [place] [address] Phone: [phone] Fax: [phone]   Pre-screening  Kristine [name] offered "in-person" vs "virtual" encounter. She indicated preferring virtual for this encounter.   Reason COVID-19*   Social distancing based on CDC and AMA recommendations.   I contacted Kristine [name] on 01/17/2022 via telephone.      I clearly  identified myself as Gillis Santa, MD. I verified that I was speaking with the correct person using two identifiers (Name: Kristine [name], and date of birth: [birth year]).  Consent I sought verbal advanced consent from Kristine [name] for virtual visit interactions. I informed Kristine [name] of possible security and privacy concerns, risks, and limitations associated with providing "not-in-person" medical evaluation and management services. I also informed Kristine [name] of the availability of "in-person" appointments. Finally, I informed her that there would be a charge for the virtual visit and that she could be  personally, fully or partially, financially responsible for it. Kristine [name] expressed understanding and agreed to proceed.   Historic Elements   Kristine [name] is a 51 y.o. year old, female patient evaluated today after our last contact on 12/18/2021. Kristine [name]  has a past medical history of Abnormal weight gain (08/05/2013), Absence of interventricular septum (08/05/2013), Allergic rhinitis (04/17/2016), Allergic urticaria due to ingested food (02/09/2016), Anxiety, Asthma, Cancer ([place]), Clinical depression (61/44/3154), Complication of anesthesia, Depression, Dizziness, Excess weight (09/30/2013), GERD (gastroesophageal reflux disease), Headache, migraine (09/30/2013), Heart murmur, Heavy drinker (04/11/2014), History of methicillin resistant staphylococcus aureus (MRSA) (11/2015), Infection of urinary tract (09/30/2013), Irregular bleeding (09/30/2013), Multiple allergies, and VSD (ventricular septal defect and aortic arch hypoplasia. She also  has a past surgical history that includes Nasal sinus surgery; Colonoscopy; Cystoscopy with hydrodistension and biopsy; Cesarean section; Laparoscopic assisted vaginal hysterectomy (N/A, 08/20/2016); Laparoscopic bilateral salpingectomy (Bilateral, 08/20/2016); Coronary angioplasty; Cystoscopy (N/A, 10/23/2016); Botox injection (N/A, 10/23/2016); Colonoscopy with  propofol (N/A, 07/04/2020); Abdominal hysterectomy; Breast biopsy (Left, 11/13/2013); Cosmetic surgery (06/09/2021); and Cosmetic surgery. Kristine [name] has a current medication list which includes the following prescription(s): albuterol, alprazolam, atrovent hfa, azelastine, biotin, wynzora, cetirizine, cosentyx sensoready pen, cranberry, eucrisa, desvenlafaxine,  dibucaine, epinephrine, fexofenadine, ipratropium, ipratropium, levocetirizine, levothyroxine, montelukast, nitrofurantoin (macrocrystal-monohydrate), omalizumab, ondansetron, otezla, ozempic (2 mg/dose), polyethylene glycol, potassium chloride, relion pen needles, spiriva respimat, triamcinolone cream, vitamin b-12, azelastine, azelastine hcl, botox, budesonide, clobetasol ointment, cyanocobalamin, fluticasone, fluticasone, fluticasone-salmeterol, ajovy, hydrochlorothiazide, magnesium oxide, mometasone, multiple vitamins-minerals, chewable vite childrens, prednisone, and vitamin c. She  reports that she quit smoking about 15 years ago. Her smoking use included cigarettes. She has never used smokeless tobacco. She reports that she does not drink alcohol and does not use drugs. Kristine [name] is allergic to bee pollen, bee venom, eggs or egg-derived products, keflex [cephalexin], lac bovis, milk-related compounds, penicillins, shellfish allergy, soy allergy, tilactase, topamax [topiramate], wheat bran, butalbital-apap-caffeine, codeine, ferrous gluconate, gabapentin, hydrocodone, iron, meclizine, red dye, iodine, and latex.   HPI  Today, she is being contacted for a post-procedure assessment.   Post-procedure evaluation    Procedure: Interlaminar Cervical Epidural Steroid injection (ESI) #2 (#1 done 10/16/21) Laterality: Right Level: C7-T1 Analgesia: Local anesthesia Sedation: minimal anxiolysis.IV versed   Imaging: Fluoroscopy-assisted  Purpose: Diagnostic/Therapeutic Indications: Cervicalgia, cervical radicular pain, degenerative disc disease,  severe enough to impact quality of life or function.  NAS-11 score:   Pre-procedure: 8 /10   Post-procedure: 1 /10      Effectiveness:  Initial hour after procedure: 75 %  Subsequent 4-6 hours post-procedure: 75 %  Analgesia past initial 6 hours: 75 % (lasting 2 weeks)  Ongoing improvement:  Analgesic:  currently at 20% Function: somewhat    Laboratory Chemistry Profile   Renal Lab Results  Component Value Date   BUN 17 09/19/2017   CREATININE 0.85 09/19/2017   BCR 27 (H) 04/17/2016   GFRAA >60 09/19/2017   GFRNONAA >60 09/19/2017    Hepatic Lab Results  Component Value Date   AST 27 09/04/2016   ALT 32 09/04/2016   ALBUMIN 4.1 09/04/2016   ALKPHOS 44 09/04/2016    Electrolytes Lab Results  Component Value Date   NA 137 09/19/2017   K 3.2 (L) 09/19/2017   CL 96 (L) 09/19/2017   CALCIUM 9.1 09/19/2017   MG 2.1 04/09/2017    Bone No results found for: VD25OH, TM226JF3LKT, GY5638LH7, DS2876OT1, 25OHVITD1, 25OHVITD2, 25OHVITD3, TESTOFREE, TESTOSTERONE  Inflammation (CRP: Acute Phase) (ESR: Chronic Phase) Lab Results  Component Value Date   ESRSEDRATE 11 09/04/2016         Note: Above Lab results reviewed.  CLINICAL DATA:  Cervicalgia. Additional history provided: Patient reports chronic neck pain for greater than 10 years, no known injury. Associated occipital headaches.   EXAM: MRI CERVICAL SPINE WITHOUT CONTRAST   TECHNIQUE: Multiplanar, multisequence MR imaging of the cervical spine was performed. No intravenous contrast was administered.   COMPARISON:  No pertinent prior exams available for comparison.   FINDINGS: Alignment: Straightening of the expected cervical lordosis. Mild cervical levocurvature, possibly positional. No significant spondylolisthesis.   Vertebrae: Vertebral body height is maintained. Trace degenerative endplate edema at X7-W6.   Cord: No spinal cord signal abnormality is identified. Spinal cord flattening at C5-C6, as  described below.   Posterior Fossa, vertebral arteries, paraspinal tissues: No abnormality identified within included portions of the posterior fossa. Flow voids preserved within the imaged cervical vertebral arteries. Paraspinal soft tissues unremarkable.   Disc levels:   Mild-to-moderate disc degeneration at C5-C6. No more than mild disc degeneration at the remaining levels.   C2-C3: Mild facet arthrosis on the right. No significant disc herniation or stenosis.   C3-C4: Uncovertebral hypertrophy on the right. Mild facet arthrosis.  No significant spinal canal stenosis. Mild right neural foraminal narrowing.   C4-C5: Shallow disc bulge. Minimal bilateral uncovertebral hypertrophy. No significant spinal canal or foraminal stenosis.   C5-C6: Posterior disc osteophyte complex with bilateral disc osteophyte ridge/uncinate hypertrophy. Severe spinal canal stenosis with moderate spinal cord flattening. No appreciable spinal cord signal abnormality. Severe bilateral neural foraminal narrowing.   C6-C7: Shallow disc bulge. Mild uncovertebral hypertrophy. No significant spinal canal or foraminal stenosis.   C7-T1: Mild facet arthrosis. No significant disc herniation or stenosis.   IMPRESSION: Cervical spondylosis, as outlined and with findings most notably as follows.   At C5-C6, there is mild-to-moderate disc degeneration with trace degenerative endplate edema. Posterior disc osteophyte complex with bilateral disc osteophyte ridge/uncinate hypertrophy. Severe spinal canal stenosis with moderate spinal cord flattening. Severe bilateral neural foraminal narrowing.   No significant spinal canal stenosis at the remaining levels. Uncovertebral hypertrophy contributes to mild right neural foraminal narrowing at C3-C4.   Straightening of the expected cervical lordosis.   Mild cervical levocurvature, possibly positional.     Electronically Signed   By: Kellie Simmering D.O.   On:  08/18/2021 18:17   Assessment  The primary encounter diagnosis was Cervical radicular pain (R>L). Diagnoses of Neuroforaminal stenosis of cervical spine (C5/6), Spinal stenosis in cervical region (severe C5/6), Cervical spondylosis, and Chronic pain syndrome were also pertinent to this visit.  Plan of Care  Problem-specific:  Cervical radicular pain (R>L) CLINICAL DATA:  Cervicalgia. Additional history provided: Patient reports chronic neck pain for greater than 10 years, no known injury. Associated occipital headaches.   EXAM: MRI CERVICAL SPINE WITHOUT CONTRAST   TECHNIQUE: Multiplanar, multisequence MR imaging of the cervical spine was performed. No intravenous contrast was administered.   COMPARISON:  No pertinent prior exams available for comparison.   FINDINGS: Alignment: Straightening of the expected cervical lordosis. Mild cervical levocurvature, possibly positional. No significant spondylolisthesis.   Vertebrae: Vertebral body height is maintained. Trace degenerative endplate edema at C3-K1.   Cord: No spinal cord signal abnormality is identified. Spinal cord flattening at C5-C6, as described below.   Posterior Fossa, vertebral arteries, paraspinal tissues: No abnormality identified within included portions of the posterior fossa. Flow voids preserved within the imaged cervical vertebral arteries. Paraspinal soft tissues unremarkable.   Disc levels:   Mild-to-moderate disc degeneration at C5-C6. No more than mild disc degeneration at the remaining levels.   C2-C3: Mild facet arthrosis on the right. No significant disc herniation or stenosis.   C3-C4: Uncovertebral hypertrophy on the right. Mild facet arthrosis. No significant spinal canal stenosis. Mild right neural foraminal narrowing.   C4-C5: Shallow disc bulge. Minimal bilateral uncovertebral hypertrophy. No significant spinal canal or foraminal stenosis.   C5-C6: Posterior disc osteophyte complex  with bilateral disc osteophyte ridge/uncinate hypertrophy. Severe spinal canal stenosis with moderate spinal cord flattening. No appreciable spinal cord signal abnormality. Severe bilateral neural foraminal narrowing.   C6-C7: Shallow disc bulge. Mild uncovertebral hypertrophy. No significant spinal canal or foraminal stenosis.   C7-T1: Mild facet arthrosis. No significant disc herniation or stenosis.   IMPRESSION: Cervical spondylosis, as outlined and with findings most notably as follows.   At C5-C6, there is mild-to-moderate disc degeneration with trace degenerative endplate edema. Posterior disc osteophyte complex with bilateral disc osteophyte ridge/uncinate hypertrophy. Severe spinal canal stenosis with moderate spinal cord flattening. Severe bilateral neural foraminal narrowing.   No significant spinal canal stenosis at the remaining levels. Uncovertebral hypertrophy contributes to mild right neural foraminal narrowing at C3-C4.  Unfortunately, no long-term benefit with 2 prior cervical epidural steroid injections, first 1 done on 10/16/2021 followed by her second one 12/18/21.   Referral to NSG to discuss surgical decompression    Orders:  Orders Placed This Encounter  Procedures   Ambulatory referral to Neurosurgery    Referral Priority:   Routine    Referral Type:   Surgical    Referral Reason:   Specialty Services Required    Referred to Provider:   Meade Maw, MD    Requested Specialty:   Neurosurgery    Number of Visits Requested:   1   Follow-up plan:   Return for PRN.     C7/T1 ESI 10/16/21 (3 cc), 12/18/21 (4 cc)    Recent Visits Date Type Provider Dept  12/18/21 Procedure visit Gillis Santa, MD [facility]  11/20/21 Office Visit Gillis Santa, MD [facility]  Showing recent visits within past 90 days and meeting all other requirements Today's Visits Date Type Provider Dept  01/17/22 Office Visit Gillis Santa, MD  [facility]  Showing today's visits and meeting all other requirements Future Appointments No visits were found meeting these conditions. Showing future appointments within next 90 days and meeting all other requirements  I discussed the assessment and treatment plan with the patient. The patient was provided an opportunity to ask questions and all were answered. The patient agreed with the plan and demonstrated an understanding of the instructions.  Patient advised to call back or seek an in-person evaluation if the symptoms or condition worsens.  Duration of encounter: 34mnutes.  Note by: BGillis Santa MD Date: 01/17/2022; Time: 9:00 AM

## 2022-01-17 NOTE — Patient Instructions (Signed)
You have been referred to neurosurgery. Someone from there will call you to schedule an appointment. ?

## 2022-01-17 NOTE — Assessment & Plan Note (Signed)
CLINICAL DATA:  Cervicalgia. Additional history provided: Patient ?reports chronic neck pain for greater than 10 years, no known ?injury. Associated occipital headaches. ?? ?EXAM: ?MRI CERVICAL SPINE WITHOUT CONTRAST ?? ?TECHNIQUE: ?Multiplanar, multisequence MR imaging of the cervical spine was ?performed. No intravenous contrast was administered. ?? ?COMPARISON:  No pertinent prior exams available for comparison. ?? ?FINDINGS: ?Alignment: Straightening of the expected cervical lordosis. Mild ?cervical levocurvature, possibly positional. No significant ?spondylolisthesis. ?? ?Vertebrae: Vertebral body height is maintained. Trace degenerative ?endplate edema at Y2-Q8. ?? ?Cord: No spinal cord signal abnormality is identified. Spinal cord ?flattening at C5-C6, as described below. ?? ?Posterior Fossa, vertebral arteries, paraspinal tissues: No ?abnormality identified within included portions of the posterior ?fossa. Flow voids preserved within the imaged cervical vertebral ?arteries. Paraspinal soft tissues unremarkable. ?? ?Disc levels: ?? ?Mild-to-moderate disc degeneration at C5-C6. No more than mild disc ?degeneration at the remaining levels. ?? ?C2-C3: Mild facet arthrosis on the right. No significant disc ?herniation or stenosis. ?? ?C3-C4: Uncovertebral hypertrophy on the right. Mild facet arthrosis. ?No significant spinal canal stenosis. Mild right neural foraminal ?narrowing. ?? ?C4-C5: Shallow disc bulge. Minimal bilateral uncovertebral ?hypertrophy. No significant spinal canal or foraminal stenosis. ?? ?C5-C6: Posterior disc osteophyte complex with bilateral disc ?osteophyte ridge/uncinate hypertrophy. Severe spinal canal stenosis ?with moderate spinal cord flattening. No appreciable spinal cord ?signal abnormality. Severe bilateral neural foraminal narrowing. ?? ?C6-C7: Shallow disc bulge. Mild uncovertebral hypertrophy. No ?significant spinal canal or foraminal stenosis. ?? ?C7-T1: Mild facet arthrosis.  No significant disc herniation or ?stenosis. ?? ?IMPRESSION: ?Cervical spondylosis, as outlined and with findings most notably as ?follows. ?? ?At C5-C6, there is mild-to-moderate disc degeneration with trace ?degenerative endplate edema. Posterior disc osteophyte complex with ?bilateral disc osteophyte ridge/uncinate hypertrophy. Severe spinal ?canal stenosis with moderate spinal cord flattening. Severe ?bilateral neural foraminal narrowing. ?? ?No significant spinal canal stenosis at the remaining levels. ?Uncovertebral hypertrophy contributes to mild right neural foraminal ?narrowing at C3-C4. ?? ?Unfortunately, no long-term benefit with 2 prior cervical epidural steroid injections, first 1 done on 10/16/2021 followed by her second one 12/18/21.  ? ?Referral to NSG to discuss surgical decompression ?

## 2022-01-25 ENCOUNTER — Other Ambulatory Visit: Payer: Self-pay | Admitting: Neurosurgery

## 2022-02-02 ENCOUNTER — Other Ambulatory Visit (HOSPITAL_COMMUNITY): Payer: Self-pay | Admitting: Neurosurgery

## 2022-02-02 ENCOUNTER — Other Ambulatory Visit: Payer: Self-pay | Admitting: Neurosurgery

## 2022-02-02 ENCOUNTER — Inpatient Hospital Stay: Admission: RE | Admit: 2022-02-02 | Payer: BC Managed Care – PPO | Source: Ambulatory Visit

## 2022-02-02 DIAGNOSIS — R159 Full incontinence of feces: Secondary | ICD-10-CM

## 2022-02-05 ENCOUNTER — Encounter
Admission: RE | Admit: 2022-02-05 | Discharge: 2022-02-05 | Disposition: A | Discharge status: Home / Self Care | Payer: BC Managed Care – PPO | Source: Ambulatory Visit | Attending: Neurosurgery | Admitting: Neurosurgery

## 2022-02-05 ENCOUNTER — Other Ambulatory Visit: Payer: Self-pay

## 2022-02-05 ENCOUNTER — Ambulatory Visit: Payer: BC Managed Care – PPO | Admitting: Dermatology

## 2022-02-05 ENCOUNTER — Ambulatory Visit
Admission: RE | Admit: 2022-02-05 | Discharge: 2022-02-05 | Disposition: A | Discharge status: Home / Self Care | Payer: BC Managed Care – PPO | Source: Ambulatory Visit | Attending: Neurosurgery | Admitting: Neurosurgery

## 2022-02-05 VITALS — BP 130/82 | HR 82 | Temp 98.2°F | Resp 14 | Ht 62.0 in | Wt 162.0 lb

## 2022-02-05 DIAGNOSIS — Q2542 Hypoplasia of aorta: Secondary | ICD-10-CM | POA: Diagnosis present

## 2022-02-05 DIAGNOSIS — R159 Full incontinence of feces: Secondary | ICD-10-CM | POA: Insufficient documentation

## 2022-02-05 DIAGNOSIS — Q210 Ventricular septal defect: Secondary | ICD-10-CM

## 2022-02-05 DIAGNOSIS — Z01812 Encounter for preprocedural laboratory examination: Secondary | ICD-10-CM | POA: Diagnosis present

## 2022-02-05 HISTORY — DX: Prediabetes: R73.03

## 2022-02-05 HISTORY — DX: Hypothyroidism, unspecified: E03.9

## 2022-02-05 HISTORY — DX: Pneumonia, unspecified organism: J18.9

## 2022-02-05 LAB — URINALYSIS, ROUTINE W REFLEX MICROSCOPIC
Bilirubin Urine: NEGATIVE
Glucose, UA: NEGATIVE mg/dL
Hgb urine dipstick: NEGATIVE
Ketones, ur: NEGATIVE mg/dL
Leukocytes,Ua: NEGATIVE
Nitrite: NEGATIVE
Protein, ur: NEGATIVE mg/dL
Specific Gravity, Urine: 1.02 (ref 1.005–1.030)
pH: 7 (ref 5.0–8.0)

## 2022-02-05 LAB — TYPE AND SCREEN
ABO/RH(D): A POS
Antibody Screen: NEGATIVE

## 2022-02-05 LAB — POTASSIUM: Potassium: 3.6 mmol/L (ref 3.5–5.1)

## 2022-02-05 LAB — SURGICAL PCR SCREEN
MRSA, PCR: POSITIVE — AB
Staphylococcus aureus: POSITIVE — AB

## 2022-02-05 MED ORDER — LACTATED RINGERS IV SOLN
1000.0000 mL | Freq: Once | INTRAVENOUS | Status: DC
  Filled 2022-02-05: qty 1000

## 2022-02-05 NOTE — Patient Instructions (Addendum)
Your procedure is scheduled on: Friday, March 31 ?Report to the Registration Desk on the 1st floor of the [facility]. ?To find out your arrival time, please call [phone] between 1PM - 3PM on: Thursday, March 30 ? ?REMEMBER: ?Instructions that are not followed completely may result in serious medical risk, up to and including death; or upon the discretion of your surgeon and anesthesiologist your surgery may need to be rescheduled. ? ?Do not eat food after midnight the night before surgery.  ?No gum chewing, lozengers or hard candies. ? ?You may however, drink CLEAR liquids up to 2 hours before you are scheduled to arrive for your surgery. Do not drink anything within 2 hours of your scheduled arrival time. ? ?Clear liquids include: ?- water  ?- apple juice without pulp ?- gatorade (not RED colors) ?- black coffee or tea (Do NOT add milk or creamers to the coffee or tea) ?Do NOT drink anything that is not on this list. ? ?TAKE THESE MEDICATIONS THE MORNING OF SURGERY WITH A SIP OF WATER: ? ?Desvenlafaxine (Pristiq) ?Levothyroxine ?Spiriva inhaler ? ?Use inhalers on the day of surgery and bring your albuterol inhaler with you to the hospital and use prior to surgery. ? ?According to Dr. Rhea Bleacher note: hold Cosentyx  until 3 weeks AFTER surgery. ? ?One week prior to surgery: ?Stop Anti-inflammatories (NSAIDS) such as Advil, Aleve, Ibuprofen, Motrin, Naproxen, Naprosyn and Aspirin based products such as Excedrin, Goodys Powder, BC Powder. ?Stop ANY OVER THE COUNTER supplements until after surgery. ?You may however, continue to take Tylenol if needed for pain up until the day of surgery. ? ?No Alcohol for 24 hours before or after surgery. ? ?No Smoking including e-cigarettes for 24 hours prior to surgery.  ?No chewable tobacco products for at least 6 hours prior to surgery.  ?No nicotine patches on the day of surgery. ? ?Do not use any "recreational" drugs for at least a week prior to your surgery.  ?Please  be advised that the combination of cocaine and anesthesia may have negative outcomes, up to and including death. ?If you test positive for cocaine, your surgery will be cancelled. ? ?On the morning of surgery brush your teeth with toothpaste and water, you may rinse your mouth with mouthwash if you wish. ?Do not swallow any toothpaste or mouthwash. ? ?Use CHG Soap as directed on instruction sheet. ? ?Do not wear jewelry, make-up, hairpins, clips or nail polish. ? ?Do not wear lotions, powders, or perfumes.  ? ?Do not shave body from the neck down 48 hours prior to surgery just in case you cut yourself which could leave a site for infection.  ?Also, freshly shaved skin may become irritated if using the CHG soap. ? ?Contact lenses, hearing aids and dentures may not be worn into surgery. ? ?Do not bring valuables to the hospital. [facility] is not responsible for any missing/lost belongings or valuables.  ? ?Notify your doctor if there is any change in your medical condition (cold, fever, infection). ? ?Wear comfortable clothing (specific to your surgery type) to the hospital. ? ?After surgery, you can help prevent lung complications by doing breathing exercises.  ?Take deep breaths and cough every 1-2 hours. Your doctor may order a device called an Incentive Spirometer to help you take deep breaths. ? ?If you are being discharged the day of surgery, you will not be allowed to drive home. ?You will need a responsible adult (18 years or older) to drive you home and  stay with you that night.  ? ?If you are taking public transportation, you will need to have a responsible adult (18 years or older) with you. ?Please confirm with your physician that it is acceptable to use public transportation.  ? ?Please call the [facility]. at [phone] if you have any questions about these instructions. ? ?Surgery Visitation Policy: ? ?Patients undergoing a surgery or procedure may have two family members or  support persons with them as long as the person is not COVID-19 positive or experiencing its symptoms.  ?

## 2022-02-05 NOTE — Progress Notes (Signed)
?  Perioperative Services ? ?Abnormal Lab Notification and Treatment Plan of Care ? ?Date: 02/05/22 ? ?Name: Kristine [name] ?MRN:   [id number] ? ?Re: Abnormal labs noted during PAT appointment ? ?Provider Notified: Meade Maw, MD ?Notification mode: Routed and/or faxed via [place] ? ?Labs of concern: ?Lab Results  ?Component Value Date  ? STAPHAUREUS POSITIVE (A) 02/05/2022  ? MRSAPCR POSITIVE (A) 02/05/2022  ?  ?Notes: Patient is scheduled for a C5-6 ANTERIOR CERVICAL DISCECTOMY AND FUSION (GLOBUS HEDRON) on 02/09/2022. She is scheduled to receive VANCOMYCIN pre-operatively. Surgical PCR (+) for MRSA; see above. ? ?PLANS:  ?Notify primary attending surgeon of (+) MRSA result.  Patient already ordered to receive preoperative vancomycin, therefore no further actions needed at this time. ? ?This is a Community education officer; no formal response is required. ? ?Honor Loh, MSN, APRN, FNP-C, CEN ?[place]  ?Peri-operative Services Nurse Practitioner ?Phone: [phone] ?02/05/22 12:58 PM ? ?

## 2022-02-08 MED ORDER — CHLORHEXIDINE GLUCONATE 0.12 % MT SOLN: 15.0000 mL | Freq: Once | OROMUCOSAL | Status: AC

## 2022-02-08 MED ORDER — VANCOMYCIN HCL 1250 MG/250ML IV SOLN
1250.0000 mg | INTRAVENOUS | Status: AC
  Administered 2022-02-09: 1250 mg via INTRAVENOUS
  Filled 2022-02-08: qty 250

## 2022-02-08 MED ORDER — ORAL CARE MOUTH RINSE: 15.0000 mL | Freq: Once | OROMUCOSAL | Status: AC

## 2022-02-08 MED ORDER — FAMOTIDINE 20 MG PO TABS
20.0000 mg | ORAL_TABLET | Freq: Once | ORAL | Status: AC
Start: 2022-02-08 — End: 2022-02-09

## 2022-02-08 MED ORDER — LACTATED RINGERS IV SOLN: INTRAVENOUS | Status: DC

## 2022-02-08 NOTE — Progress Notes (Signed)
Pharmacy Antibiotic Note ? ?Kristine [name] is a 51 y.o. female admitted on (Not on file) with surgical prophylaxis.  Pharmacy has been consulted for Vancomycin dosing. ? ?TBW = 73.5 kg  ? ?Plan: ?Vancomycin 1250 mg (15 mg/kg) IV X 1 60 min pre-op ordered for 3/31 @ 0500.  ? ?  ? ?No data recorded. ? ?No results for input(s): WBC, CREATININE, LATICACIDVEN, VANCOTROUGH, VANCOPEAK, VANCORANDOM, GENTTROUGH, GENTPEAK, GENTRANDOM, TOBRATROUGH, TOBRAPEAK, TOBRARND, AMIKACINPEAK, AMIKACINTROU, AMIKACIN in the last 168 hours.  ?CrCl cannot be calculated (Patient's most recent lab result is older than the maximum 21 days allowed.).   ? ?Allergies  ?Allergen Reactions  ? Bee Pollen Anaphylaxis  ? Bee Venom Anaphylaxis  ? Eggs Or Egg-Derived Products Anaphylaxis  ? Keflex [Cephalexin] Anaphylaxis  ? Lac Bovis Anaphylaxis  ? Milk-Related Compounds Anaphylaxis  ? Penicillins Anaphylaxis  ?  Has patient had a PCN reaction causing immediate rash, facial/tongue/throat swelling, SOB or lightheadedness with hypotension: No ?Has patient had a PCN reaction causing severe rash involving mucus membranes or skin necrosis: No ?Has patient had a PCN reaction that required hospitalization No ?Has patient had a PCN reaction occurring within the last 10 years: Yes ?If all of the above answers are "NO", then may proceed with Cephalosporin use. ?  ? Red Dye Shortness Of Breath  ? Shellfish Allergy Anaphylaxis and Other (See Comments)  ? Soy Allergy Anaphylaxis  ? Tilactase Anaphylaxis and Other (See Comments)  ? Topamax [Topiramate]   ?  Diarrhea   ? Wheat Bran Anaphylaxis  ? Yellow Dyes (Non-Tartrazine) Shortness Of Breath  ? Butalbital-Apap-Caffeine Other (See Comments)  ?  Rebound headache ?Rebound headache  ? Ferrous Gluconate Diarrhea and Nausea And Vomiting  ? Gabapentin Other (See Comments)  ? Hydrocodone Hives  ? Iron Diarrhea  ? Meclizine   ?  'Acts crazy', insomnia  ? Codeine Rash  ? Iodine Rash  ? Latex Itching and Rash   ? ? ?Antimicrobials this admission: ?  >>  ?  >>  ? ?Dose adjustments this admission: ? ? ?Microbiology results: ? BCx:  ? UCx:   ? Sputum:   ? MRSA PCR:  ? ?Thank you for allowing pharmacy to be a part of this patient?s care. ? ?Newell Wafer D ?02/08/2022 10:07 PM ? ?

## 2022-02-09 ENCOUNTER — Ambulatory Visit: Payer: BC Managed Care – PPO | Admitting: Urgent Care

## 2022-02-09 ENCOUNTER — Ambulatory Visit
Admission: RE | Admit: 2022-02-09 | Discharge: 2022-02-09 | Disposition: A | Discharge status: Home / Self Care | Payer: BC Managed Care – PPO | Attending: Neurosurgery | Admitting: Neurosurgery

## 2022-02-09 ENCOUNTER — Ambulatory Visit: Payer: BC Managed Care – PPO

## 2022-02-09 ENCOUNTER — Encounter
Admission: RE | Disposition: A | Discharge status: Home / Self Care | Payer: Self-pay | Source: Home / Self Care | Attending: Neurosurgery

## 2022-02-09 ENCOUNTER — Other Ambulatory Visit: Payer: Self-pay

## 2022-02-09 ENCOUNTER — Encounter: Payer: Self-pay | Admitting: Neurosurgery

## 2022-02-09 DIAGNOSIS — G992 Myelopathy in diseases classified elsewhere: Secondary | ICD-10-CM | POA: Diagnosis not present

## 2022-02-09 DIAGNOSIS — F419 Anxiety disorder, unspecified: Secondary | ICD-10-CM | POA: Diagnosis not present

## 2022-02-09 DIAGNOSIS — M4802 Spinal stenosis, cervical region: Secondary | ICD-10-CM | POA: Diagnosis present

## 2022-02-09 DIAGNOSIS — L4050 Arthropathic psoriasis, unspecified: Secondary | ICD-10-CM | POA: Diagnosis not present

## 2022-02-09 DIAGNOSIS — M2578 Osteophyte, vertebrae: Secondary | ICD-10-CM | POA: Diagnosis not present

## 2022-02-09 DIAGNOSIS — E039 Hypothyroidism, unspecified: Secondary | ICD-10-CM | POA: Diagnosis not present

## 2022-02-09 DIAGNOSIS — Z79899 Other long term (current) drug therapy: Secondary | ICD-10-CM | POA: Insufficient documentation

## 2022-02-09 DIAGNOSIS — F32A Depression, unspecified: Secondary | ICD-10-CM | POA: Diagnosis not present

## 2022-02-09 DIAGNOSIS — J45909 Unspecified asthma, uncomplicated: Secondary | ICD-10-CM | POA: Diagnosis not present

## 2022-02-09 HISTORY — PX: ANTERIOR CERVICAL DECOMP/DISCECTOMY FUSION: SHX1161

## 2022-02-09 SURGERY — ANTERIOR CERVICAL DECOMPRESSION/DISCECTOMY FUSION 1 LEVEL
Anesthesia: General

## 2022-02-09 MED ORDER — OXYCODONE-ACETAMINOPHEN 5-325 MG PO TABS
1.0000 | ORAL_TABLET | ORAL | Status: DC | PRN
Start: 2022-02-09 — End: 2022-02-09
  Administered 2022-02-09: 1 via ORAL

## 2022-02-09 MED ORDER — ACETAMINOPHEN 10 MG/ML IV SOLN
INTRAVENOUS | Status: DC | PRN
Start: 2022-02-09 — End: 2022-02-09
  Administered 2022-02-09: 1000 mg via INTRAVENOUS

## 2022-02-09 MED ORDER — DEXAMETHASONE SODIUM PHOSPHATE 10 MG/ML IJ SOLN
INTRAMUSCULAR | Status: DC | PRN
Start: 2022-02-09 — End: 2022-02-09
  Administered 2022-02-09: 10 mg via INTRAVENOUS

## 2022-02-09 MED ORDER — FENTANYL CITRATE (PF) 100 MCG/2ML IJ SOLN
25.0000 ug | INTRAMUSCULAR | Status: DC | PRN
  Administered 2022-02-09 (×3): 25 ug via INTRAVENOUS
  Administered 2022-02-09: 50 ug via INTRAVENOUS

## 2022-02-09 MED ORDER — ONDANSETRON HCL 4 MG/2ML IJ SOLN
INTRAMUSCULAR | Status: DC | PRN
  Administered 2022-02-09: 4 mg via INTRAVENOUS

## 2022-02-09 MED ORDER — PROPOFOL 500 MG/50ML IV EMUL
INTRAVENOUS | Status: DC | PRN
  Administered 2022-02-09: 100 ug/kg/min via INTRAVENOUS

## 2022-02-09 MED ORDER — CHLORHEXIDINE GLUCONATE 0.12 % MT SOLN
OROMUCOSAL | Status: AC
  Administered 2022-02-09: 15 mL via OROMUCOSAL
  Filled 2022-02-09: qty 15

## 2022-02-09 MED ORDER — THROMBIN 5000 UNITS EX SOLR
CUTANEOUS | Status: AC
Start: 2022-02-09 — End: ?
  Filled 2022-02-09: qty 5000

## 2022-02-09 MED ORDER — FAMOTIDINE 20 MG PO TABS
ORAL_TABLET | ORAL | Status: AC
  Administered 2022-02-09: 20 mg via ORAL
  Filled 2022-02-09: qty 1

## 2022-02-09 MED ORDER — PHENYLEPHRINE HCL-NACL 20-0.9 MG/250ML-% IV SOLN
INTRAVENOUS | Status: DC | PRN
  Administered 2022-02-09: 20 ug/min via INTRAVENOUS

## 2022-02-09 MED ORDER — MIDAZOLAM HCL 2 MG/2ML IJ SOLN
INTRAMUSCULAR | Status: DC | PRN
  Administered 2022-02-09: 2 mg via INTRAVENOUS

## 2022-02-09 MED ORDER — BUPIVACAINE-EPINEPHRINE (PF) 0.5% -1:200000 IJ SOLN
INTRAMUSCULAR | Status: AC
  Filled 2022-02-09: qty 30

## 2022-02-09 MED ORDER — FENTANYL CITRATE (PF) 100 MCG/2ML IJ SOLN
INTRAMUSCULAR | Status: AC
  Filled 2022-02-09: qty 2

## 2022-02-09 MED ORDER — FENTANYL CITRATE (PF) 250 MCG/5ML IJ SOLN
INTRAMUSCULAR | Status: AC
  Filled 2022-02-09: qty 5

## 2022-02-09 MED ORDER — FENTANYL CITRATE (PF) 100 MCG/2ML IJ SOLN
INTRAMUSCULAR | Status: DC | PRN
  Administered 2022-02-09: 50 ug via INTRAVENOUS
  Administered 2022-02-09: 100 ug via INTRAVENOUS

## 2022-02-09 MED ORDER — REMIFENTANIL HCL 1 MG IV SOLR
INTRAVENOUS | Status: AC
  Filled 2022-02-09: qty 1000

## 2022-02-09 MED ORDER — SURGIFLO WITH THROMBIN (HEMOSTATIC MATRIX KIT) OPTIME
TOPICAL | Status: DC | PRN
Start: 2022-02-09 — End: 2022-02-09
  Administered 2022-02-09: 1 via TOPICAL

## 2022-02-09 MED ORDER — ONDANSETRON HCL 4 MG/2ML IJ SOLN: 4.0000 mg | Freq: Once | INTRAMUSCULAR | Status: DC | PRN

## 2022-02-09 MED ORDER — MIDAZOLAM HCL 2 MG/2ML IJ SOLN
INTRAMUSCULAR | Status: AC
  Filled 2022-02-09: qty 2

## 2022-02-09 MED ORDER — OXYCODONE-ACETAMINOPHEN 5-325 MG PO TABS: 1.0000 | ORAL_TABLET | ORAL | 0 refills | Status: AC | PRN

## 2022-02-09 MED ORDER — GELATIN ABSORBABLE 12-7 MM EX MISC
CUTANEOUS | Status: AC
  Filled 2022-02-09: qty 1

## 2022-02-09 MED ORDER — 0.9 % SODIUM CHLORIDE (POUR BTL) OPTIME
TOPICAL | Status: DC | PRN
Start: 2022-02-09 — End: 2022-02-09
  Administered 2022-02-09: 500 mL

## 2022-02-09 MED ORDER — BUPIVACAINE-EPINEPHRINE (PF) 0.5% -1:200000 IJ SOLN
INTRAMUSCULAR | Status: DC | PRN
  Administered 2022-02-09: 6 mL

## 2022-02-09 MED ORDER — KETAMINE HCL 50 MG/5ML IJ SOSY
PREFILLED_SYRINGE | INTRAMUSCULAR | Status: AC
Start: 2022-02-09 — End: ?
  Filled 2022-02-09: qty 5

## 2022-02-09 MED ORDER — ATROPINE SULFATE 0.4 MG/ML IV SOLN
INTRAVENOUS | Status: AC
  Filled 2022-02-09: qty 1

## 2022-02-09 MED ORDER — MUPIROCIN 2 % EX OINT
1.0000 "application " | TOPICAL_OINTMENT | Freq: Once | CUTANEOUS | Status: AC
  Administered 2022-02-09: 1 via TOPICAL
  Filled 2022-02-09: qty 22

## 2022-02-09 MED ORDER — OXYCODONE HCL 5 MG PO TABS
ORAL_TABLET | ORAL | Status: AC
  Filled 2022-02-09: qty 1

## 2022-02-09 MED ORDER — PROPOFOL 10 MG/ML IV BOLUS
INTRAVENOUS | Status: DC | PRN
  Administered 2022-02-09: 150 mg via INTRAVENOUS

## 2022-02-09 MED ORDER — KETAMINE HCL 10 MG/ML IJ SOLN
INTRAMUSCULAR | Status: DC | PRN
  Administered 2022-02-09: 25 mg via INTRAVENOUS

## 2022-02-09 MED ORDER — REMIFENTANIL HCL 1 MG IV SOLR
INTRAVENOUS | Status: DC | PRN
  Administered 2022-02-09: .2 ug/kg/min via INTRAVENOUS

## 2022-02-09 MED ORDER — OXYCODONE-ACETAMINOPHEN 5-325 MG PO TABS
ORAL_TABLET | ORAL | Status: DC
Start: 2022-02-09 — End: 2022-02-09
  Filled 2022-02-09: qty 1

## 2022-02-09 MED ORDER — LIDOCAINE HCL (CARDIAC) PF 100 MG/5ML IV SOSY
PREFILLED_SYRINGE | INTRAVENOUS | Status: DC | PRN
  Administered 2022-02-09: 80 mg via INTRAVENOUS

## 2022-02-09 MED ORDER — OXYCODONE HCL 5 MG PO TABS
5.0000 mg | ORAL_TABLET | Freq: Once | ORAL | Status: AC
  Administered 2022-02-09: 5 mg via ORAL

## 2022-02-09 MED ORDER — METHOCARBAMOL 500 MG PO TABS: 500.0000 mg | ORAL_TABLET | Freq: Three times a day (TID) | ORAL | 0 refills | Status: DC | PRN

## 2022-02-09 MED ORDER — SUCCINYLCHOLINE CHLORIDE 200 MG/10ML IV SOSY
PREFILLED_SYRINGE | INTRAVENOUS | Status: DC | PRN
Start: 2022-02-09 — End: 2022-02-09
  Administered 2022-02-09: 100 mg via INTRAVENOUS

## 2022-02-09 MED ORDER — FENTANYL CITRATE (PF) 100 MCG/2ML IJ SOLN
INTRAMUSCULAR | Status: AC
  Administered 2022-02-09: 25 ug via INTRAVENOUS
  Filled 2022-02-09: qty 2

## 2022-02-09 MED ORDER — ATROPINE SULFATE 0.4 MG/ML IV SOLN
INTRAVENOUS | Status: DC | PRN
  Administered 2022-02-09: .2 mg via INTRAVENOUS

## 2022-02-09 MED ORDER — METHOCARBAMOL 500 MG PO TABS
ORAL_TABLET | ORAL | Status: AC
  Filled 2022-02-09: qty 1

## 2022-02-09 MED ORDER — ACETAMINOPHEN 10 MG/ML IV SOLN
INTRAVENOUS | Status: AC
  Filled 2022-02-09: qty 100

## 2022-02-09 SURGICAL SUPPLY — 60 items
ADH SKN CLS APL DERMABOND .7 (GAUZE/BANDAGES/DRESSINGS) ×1
AGENT HMST KT MTR STRL THRMB (HEMOSTASIS) ×1
APL PRP STRL LF DISP 70% ISPRP (MISCELLANEOUS) ×3
BUR NEURO DRILL SOFT 3.0X3.8M (BURR) ×2 IMPLANT
CHLORAPREP W/TINT 26 (MISCELLANEOUS) ×5 IMPLANT
COUNTER NEEDLE 20/40 LG (NEEDLE) ×2 IMPLANT
CUP MEDICINE 2OZ PLAST GRAD ST (MISCELLANEOUS) ×2 IMPLANT
DERMABOND ADVANCED (GAUZE/BANDAGES/DRESSINGS) ×1
DERMABOND ADVANCED .7 DNX12 (GAUZE/BANDAGES/DRESSINGS) ×1 IMPLANT
DRAPE C ARM PK CFD 31 SPINE (DRAPES) ×2 IMPLANT
DRAPE LAPAROTOMY 77X122 PED (DRAPES) ×2 IMPLANT
DRAPE MICROSCOPE SPINE 48X150 (DRAPES) ×3 IMPLANT
DRAPE SURG 17X11 SM STRL (DRAPES) ×8 IMPLANT
ELECT CAUTERY BLADE TIP 2.5 (TIP) ×2
ELECT REM PT RETURN 9FT ADLT (ELECTROSURGICAL) ×2
ELECTRODE CAUTERY BLDE TIP 2.5 (TIP) ×1 IMPLANT
ELECTRODE REM PT RTRN 9FT ADLT (ELECTROSURGICAL) ×1 IMPLANT
FEE INTRAOP CADWELL SUPPLY NCS (MISCELLANEOUS) ×1 IMPLANT
FEE INTRAOP MONITOR IMPULS NCS (MISCELLANEOUS) IMPLANT
GAUZE 4X4 16PLY ~~LOC~~+RFID DBL (SPONGE) ×2 IMPLANT
GLOVE SURG SYN 6.5 ES PF (GLOVE) ×2 IMPLANT
GLOVE SURG SYN 6.5 PF PI (GLOVE) ×1 IMPLANT
GLOVE SURG SYN 8.5  E (GLOVE) ×6
GLOVE SURG SYN 8.5 E (GLOVE) ×3 IMPLANT
GLOVE SURG SYN 8.5 PF PI (GLOVE) ×3 IMPLANT
GLOVE SURG UNDER POLY LF SZ6.5 (GLOVE) ×2 IMPLANT
GOWN SRG LRG LVL 4 IMPRV REINF (GOWNS) ×1 IMPLANT
GOWN SRG XL LVL 3 NONREINFORCE (GOWNS) ×1 IMPLANT
GOWN STRL NON-REIN TWL XL LVL3 (GOWNS) ×2
GOWN STRL REIN LRG LVL4 (GOWNS) ×2
GRADUATE 1200CC STRL 31836 (MISCELLANEOUS) ×2 IMPLANT
GRAFT DURAGEN MATRIX 1WX1L (Tissue) IMPLANT
INTRAOP CADWELL SUPPLY FEE NCS (MISCELLANEOUS) ×1
INTRAOP DISP SUPPLY FEE NCS (MISCELLANEOUS) ×2
INTRAOP MONITOR FEE IMPULS NCS (MISCELLANEOUS) ×1
INTRAOP MONITOR FEE IMPULSE (MISCELLANEOUS) ×2
KIT TURNOVER KIT A (KITS) ×2 IMPLANT
MANIFOLD NEPTUNE II (INSTRUMENTS) ×2 IMPLANT
MARKER SKIN DUAL TIP RULER LAB (MISCELLANEOUS) ×4 IMPLANT
NDL SAFETY ECLIPSE 18X1.5 (NEEDLE) ×1 IMPLANT
NEEDLE HYPO 18GX1.5 SHARP (NEEDLE) ×2
NEEDLE HYPO 22GX1.5 SAFETY (NEEDLE) ×2 IMPLANT
PACK LAMINECTOMY NEURO (CUSTOM PROCEDURE TRAY) ×2 IMPLANT
PAD ARMBOARD 7.5X6 YLW CONV (MISCELLANEOUS) ×4 IMPLANT
PIN CASPAR 14 (PIN) ×1 IMPLANT
PIN CASPAR 14MM (PIN) ×2 IMPLANT
PLATE ANT CERV XTEND 1 LV 12 (Plate) ×1 IMPLANT
PUTTY DBX 1CC (Putty) ×2 IMPLANT
PUTTY DBX 1CC DEPUY (Putty) IMPLANT
SCREW VAR 4.2 XD SELF DRILL 16 (Screw) ×4 IMPLANT
SPACER C HEDRON 12X14 7M 7D (Spacer) ×1 IMPLANT
SPONGE KITTNER 5P (MISCELLANEOUS) ×2 IMPLANT
STAPLER SKIN PROX 35W (STAPLE) IMPLANT
SURGIFLO W/THROMBIN 8M KIT (HEMOSTASIS) ×2 IMPLANT
SUT V-LOC 90 ABS DVC 3-0 CL (SUTURE) ×2 IMPLANT
SUT VIC AB 3-0 SH 8-18 (SUTURE) ×2 IMPLANT
SYR 30ML LL (SYRINGE) ×2 IMPLANT
TAPE CLOTH 3X10 WHT NS LF (GAUZE/BANDAGES/DRESSINGS) ×2 IMPLANT
TOWEL OR 17X26 4PK STRL BLUE (TOWEL DISPOSABLE) ×6 IMPLANT
TUBING CONNECTING 10 (TUBING) ×2 IMPLANT

## 2022-02-09 NOTE — Anesthesia Preprocedure Evaluation (Signed)
Anesthesia Evaluation  ?Patient identified by MRN, date of birth, ID band ?Patient awake ? ? ? ?Reviewed: ?Allergy & Precautions, H&P , NPO status , Patient's Chart, lab work & pertinent test results, reviewed documented beta blocker date and time  ? ?History of Anesthesia Complications ?Negative for: history of anesthetic complications ? ?Airway ?Mallampati: III ? ?TM Distance: >3 FB ?Neck ROM: Limited ? ? ? Dental ? ?(+) Poor Dentition ?  ?Pulmonary ?neg shortness of breath, asthma , former smoker,  ?Takes albuterol once per day, also on daily spireva. Not hospitalized since child. ?  ?Pulmonary exam normal ? ? ? ? ? ? ? Cardiovascular ?Exercise Tolerance: Good ?Normal cardiovascular exam+ Valvular Problems/Murmurs  ?Rhythm:regular Rate:Normal ? ?VSD with no other sx described except occassional palpitations. She says she sometimes passes out with heavy exertion.... ?She says she does follow regularly with a cardiologist who says they are monitoring the size of the VSD, nothing to do currently. ?Per last cardiology note 07/2021: ?"She reported symptoms that are potentially concerning for chronic heart failure (edema, DOE), however her exam and other history are reassuring. Her edema is in her hands and improves with working and movement.  ?Also on hydrochlorothiazide. None seen in lower extremities on exam. No orthopnea or PND.  ?Stable LV dimensions and function in the setting of known small VSD but none seen on last echo.  ?Chronic noncardiac chest pain stable. 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  ?Labs have recently been done in care everywhere.  ? ?VSD (ventricular septal defect and aortic arch hypoplasia)  ?Small perimembranous VSD without significant left-to-right shunting. Has been stable on serial TTE so far.  ?TTE most recently in November 2021 showed no VSD.  ?We will continue to follow with periodic echocardiograms every few years or when symptoms warrant." ?   ?Neuro/Psych ? Headaches, PSYCHIATRIC DISORDERS Anxiety Depression  Neuromuscular disease   ? GI/Hepatic ?Neg liver ROS, GERD  Medicated,  ?Endo/Other  ?Hypothyroidism  ? Renal/GU ?negative Renal ROS  ?negative genitourinary ?  ?Musculoskeletal ? ?(+) Arthritis ,  ? Abdominal ?  ?Peds ? Hematology ?negative hematology ROS ?(+)   ?Anesthesia Other Findings ?Past Medical History: ?08/05/2013: Abnormal weight gain ?08/05/2013: Absence of interventricular septum ?04/17/2016: Allergic rhinitis ?02/09/2016: Allergic urticaria due to ingested food ?No date: Anxiety ?No date: Asthma ?09/30/2013: Clinical depression ?No date: Complication of anesthesia ?    Comment:  PT HAD TROUBLE BREATHING AFTER HYSTERECTOMY IN OCTOBER ?No date: Depression ?No date: Dizziness ?09/30/2013: Excess weight ?No date: GERD (gastroesophageal reflux disease) ?09/30/2013: Headache, migraine ?No date: Heart murmur ?04/11/2014: Heavy drinker ?    Comment:  Overview:  Patient reports drinking 2-3 bottles of wine  ?             each weekend.   ?11/2015: History of methicillin resistant staphylococcus aureus (MRSA) ?09/30/2013: Infection of urinary tract ?09/30/2013: Irregular bleeding ?No date: Multiple allergies ?No date: VSD (ventricular septal defect and aortic arch hypoplasia ?Past Surgical History: ?10/23/2016: BOTOX INJECTION; N/A ?    Comment:  Procedure: BOTOX INJECTION;  Surgeon: Royston Cowper,  ?             MD;  Location: [facility] ORS;  Service: Urology;  Laterality:  ?             N/A; ?11/13/13: BREAST BIOPSY; Left ?    Comment:  benign ?No date: CESAREAN SECTION ?No date: COLONOSCOPY ?No date: CORONARY ANGIOPLASTY ?10/23/2016: CYSTOSCOPY; N/A ?    Comment:  Procedure:  CYSTOSCOPY;  Surgeon: Royston Cowper, MD;   ?             Location: [facility] OR[facility];  Service: Urology;  Laterality: N/A; ?No date: CYSTOSCOPY WITH HYDRODISTENSION AND BIOPSY ?08/20/2016: LAPAROSCOPIC ASSISTED VAGINAL HYSTERECTOMY; N/A ?    Comment:  Procedure: LAPAROSCOPIC ASSISTED VAGINAL  HYSTERECTOMY;   ?             Surgeon: Boykin Nearing, MD;  Location: [facility];  ?             Service: Gynecology;  Laterality: N/A; ?08/20/2016: LAPAROSCOPIC BILATERAL SALPINGECTOMY; Bilateral ?    Comment:  Procedure: LAPAROSCOPIC BILATERAL SALPINGECTOMY;   ?             Surgeon: Boykin Nearing, MD;  Location: [facility];  ?             Service: Gynecology;  Laterality: Bilateral; ?No date: NASAL SINUS SURGERY ?    Comment:  x12 ? ? Reproductive/Obstetrics ?negative OB ROS ? ?  ? ? ? ? ? ? ? ? ? ? ? ? ? ?  ?  ? ? ? ? ? ? ? ? ?Anesthesia Physical ? ?Anesthesia Plan ? ?ASA: 2 ? ?Anesthesia Plan: General  ? ?Post-op Pain Management: Ofirmev IV (intra-op)*, Ketamine IV* and Dilaudid IV  ? ?Induction: Intravenous ? ?PONV Risk Score and Plan: 4 or greater and Ondansetron, Dexamethasone, Midazolam, TIVA and Propofol infusion ? ?Airway Management Planned: Oral ETT and Video Laryngoscope Planned ? ?Additional Equipment: None ? ?Intra-op Plan:  ? ?Post-operative Plan: Extubation in OR ? ?Informed Consent: I have reviewed the patients History and Physical, chart, labs and discussed the procedure including the risks, benefits and alternatives for the proposed anesthesia with the patient or authorized representative who has indicated his/her understanding and acceptance.  ? ? ? ?Dental Advisory Given ? ?Plan Discussed with: CRNA ? ?Anesthesia Plan Comments: (Discussed risks of anesthesia with patient, including PONV, sore throat, lip/dental/eye damage. Rare risks discussed as well, such as cardiorespiratory and neurological sequelae, and allergic reactions. Discussed the role of CRNA in patient's perioperative care. Patient understands. ?Patient says she "woke up during surgery" before; on further investigation, it was during a colonoscopy, and patient does not remember this occurrence anymore. She did say she was traumatized though. I assured her this is full general anesthesia compared to a colonoscopy and the  incidence of true recall or awareness under anesthesia is exxceedingly rare.)  ? ? ? ? ? ? ?Anesthesia Quick Evaluation ? ?

## 2022-02-09 NOTE — Op Note (Signed)
Indications: Kristine [name] is suffering from Cervical myelopathy G95.9, Degenerative cervical spinal stenosis M48.02. she failed conservative management, and elected to proceed with surgery. ? ?Findings: cervical stenosis ? ?Preoperative Diagnosis: Cervical myelopathy G95.9, Degenerative cervical spinal stenosis M48.02 ?Postoperative Diagnosis: same ? ? ?EBL: 25 ml ?IVF: see AR ml ?Drains: none ?Disposition: Extubated and Stable to PACU ?Complications: none ? ?No foley catheter was placed. ? ? ?Preoperative Note:  ? ?Risks of surgery discussed include: infection, bleeding, stroke, coma, death, paralysis, CSF leak, nerve/spinal cord injury, numbness, tingling, weakness, complex regional pain syndrome, recurrent stenosis and/or disc herniation, vascular injury, development of instability, neck/back pain, need for further surgery, persistent symptoms, development of deformity, and the risks of anesthesia. The patient understood these risks and agreed to proceed. ? ?Operative Note:  ? ?Procedure:  ?1) Anterior cervical diskectomy and fusion at C5-6 ?2) Anterior cervical instrumentation at C5-6 using Globus Xtend ?3) Insertion of biomechanical device at C5-6 ?  ?Procedure: After obtaining informed consent, the patient taken to the operating room, placed in supine position, general anesthesia induced.  The patient had a small shoulder roll placed behind their shoulders.  The patient received preop antibiotics and IV Decadron.  The patient had a neck incision outlined, was prepped and draped in usual sterile fashion. The incision was injected with local anesthetic.   An incision was opened, dissection taken down medial to the carotid artery and jugular vein, lateral to the trachea and esophagus.  The prevertebral fascia was identified, and a localizing x-ray demonstrated the correct level.  The longus colli were dissected laterally, and self-retaining retractors placed to open the operative field. The microscope was  then brought into the field. ? ?With this complete, distractor pins were placed in the vertebral bodies of C5 and C6. The distractor was placed, and the annulus at C5/6 was opened using a bovie.  Curettes and pituitary rongeurs used to remove the majority of disk, then the drill was used to remove the posterior osteophyte and begin the foraminotomies. The nerve hook was used to elevate the posterior longitudinal ligament, which was then removed with Kerrison rongeurs. Bilateral foraminotomies were performed. The microblunt nerve hook could be passed out the foramen bilaterally.   Meticulous hemostasis was obtained.  A biomechanical device (Globus Hedron C 7 mm height x 14 mm width by 12 mm depth) was placed at C5/6. The device had been filled with allograft for aid in arthrodesis. ? ?The caspar distractor was removed, and bone wax used for hemostasis. A 12 mm Globus Xtend plate was chosen.  Two screws placed in each vertebral body, respectively making sure the screws were behind the locking mechanism.  Final AP and lateral radiographs were taken.  ? ?With everything in good position, the wound was irrigated copiously with bacitracin-containing solution and meticulous hemostasis obtained.  Wound was closed in 2 layers using interrupted inverted 3-0 Vicryl sutures.  The wound was dressed with dermabond, the head of bed at 30 degrees, taken to recovery room in stable condition.  No new postop neurological deficits were identified. ? ?Sponge and pattie counts were correct at the end of the procedure.  ? ?I performed the entire procedure with the assistance of Cooper Render PA as an Pensions consultant. ? ?Meade Maw MD ? ? ? ? ?

## 2022-02-09 NOTE — OR Nursing (Signed)
Upon arrival in [name] patient is crying and holding her head and states she often gets migraines and she feels like she may be getting one.  Lights off, music on, ice to forehead and healing touch session done.  She feel asleep after about 15 min.  Refuses Robaxin due to adverse reactions of leg jumping in the past. ?

## 2022-02-09 NOTE — Transfer of Care (Signed)
Immediate Anesthesia Transfer of Care Note ? ?Patient: Kristine [name] ? ?Procedure(s) Performed: C5-6 ANTERIOR CERVICAL DISCECTOMY AND FUSION (GLOBUS HEDRON) ? ?Patient Location: PACU ? ?Anesthesia Type:General ? ?Level of Consciousness: awake, drowsy and patient cooperative ? ?Airway & Oxygen Therapy: Patient Spontanous Breathing and Patient connected to face mask oxygen ? ?Post-op Assessment: Report given to RN and Post -op Vital signs reviewed and stable ? ?Post vital signs: Reviewed and stable ? ?Last Vitals:  ?Vitals Value Taken Time  ?BP 161/89 02/09/22 1220  ?Temp 36.3 ?C 02/09/22 1211  ?Pulse 88 02/09/22 1225  ?Resp 17 02/09/22 1225  ?SpO2 96 % 02/09/22 1225  ?Vitals shown include unvalidated device data. ? ?Last Pain:  ?Vitals:  ? 02/09/22 1211  ?TempSrc:   ?PainSc: 0-No pain  ?   ? ?  ? ?Complications: No notable events documented. ?

## 2022-02-09 NOTE — Progress Notes (Signed)
Patient MRSA swab positive, but patient allergic to iodine.  Notified Dr. Izora Ribas and he acknowledged and gave verbal order for Mupericin pre-op.  I acknowledged and placed the order. ?

## 2022-02-09 NOTE — H&P (Signed)
I have reviewed and confirmed my history and physical from 01/25/22 with no additions or changes. Plan for C5-6 ACDF.  Risks and benefits reviewed. ? ?Heart sounds normal no MRG. Chest Clear to Auscultation Bilaterally. ? ? ?  ? ? ?

## 2022-02-09 NOTE — Anesthesia Postprocedure Evaluation (Signed)
Anesthesia Post Note ? ?Patient: Kristine [name] ? ?Procedure(s) Performed: C5-6 ANTERIOR CERVICAL DISCECTOMY AND FUSION (GLOBUS HEDRON) ? ?Patient location during evaluation: PACU ?Anesthesia Type: General ?Level of consciousness: awake and alert ?Pain management: pain level controlled ?Vital Signs Assessment: post-procedure vital signs reviewed and stable ?Respiratory status: spontaneous breathing, nonlabored ventilation, respiratory function stable and patient connected to nasal cannula oxygen ?Cardiovascular status: blood pressure returned to baseline and stable ?Postop Assessment: no apparent nausea or vomiting ?Anesthetic complications: no ? ? ?No notable events documented. ? ? ?Last Vitals:  ?Vitals:  ? 02/09/22 1325 02/09/22 1341  ?BP:  (!) 154/85  ?Pulse: 93 90  ?Resp: 20 16  ?Temp:  (!) 36.1 ?C  ?SpO2: 92% 94%  ?  ?Last Pain:  ?Vitals:  ? 02/09/22 1341  ?TempSrc: Temporal  ?PainSc: 0-No pain  ? ? ?  ?  ?  ?  ?  ?  ? ?Arita Miss ? ? ? ? ?

## 2022-02-09 NOTE — Anesthesia Procedure Notes (Signed)
Procedure Name: Intubation ?Date/Time: 02/09/2022 10:27 AM ?Performed by: Lowry Bowl, CRNA ?Pre-anesthesia Checklist: Patient identified, Emergency Drugs available, Suction available and Patient being monitored ?Patient Re-evaluated:Patient Re-evaluated prior to induction ?Oxygen Delivery Method: Circle system utilized ?Preoxygenation: Pre-oxygenation with 100% oxygen ?Induction Type: IV induction and Cricoid Pressure applied ?Ventilation: Mask ventilation without difficulty ?Laryngoscope Size: 3 and McGraph ?Grade View: Grade I ?Tube type: Oral ?Tube size: 6.5 mm ?Number of attempts: 1 ?Airway Equipment and Method: Stylet and Video-laryngoscopy ?Placement Confirmation: ETT inserted through vocal cords under direct vision, positive ETCO2 and breath sounds checked- equal and bilateral ?Secured at: 20 cm ?Tube secured with: Tape ?Dental Injury: Teeth and Oropharynx as per pre-operative assessment  ?Comments: Head and neck kept neutral and stable throughout intubation ? ? ? ? ?

## 2022-02-09 NOTE — Discharge Summary (Signed)
Physician Discharge Summary  ?Patient ID: ?Kristine [name] ?MRN: [id number] ?DOB/AGE: 53-11-1970 53 y.o. ? ?Admit date: 02/09/2022 ?Discharge date: 02/09/2022 ? ?Admission Diagnoses: cervical myelopathy  ? ?Discharge Diagnoses:  ?Active Problems: ?  * No active hospital problems. * ? ? ?Discharged Condition: good ? ?Hospital Course:  ?Kristine [name] is a 51 y.o female s/p C5-6 ACDF for cervical myelopathy. She was monitored in the PACU post-op for 4 hours and ambulated, urinated, and tolerated PO intake prior to discharge. She was given prescriptions for medication for pain. ? ?Consults:  none  ? ?Significant Diagnostic Studies: none  ? ?Treatments: surgery: as above. Please see separately dictated operative report for further details ? ?Discharge Exam: ?Blood pressure 138/89, pulse 86, temperature (!) 97.4 ?F (36.3 ?C), temperature source Temporal, resp. rate 16, height '5\' 2"'$  (1.575 m), weight 73.5 kg, last menstrual period 07/17/2016, SpO2 96 %. ?RUE 4/5 biceps, 4+ triceps 5/5 deltoid, 4- IO, 4- HG ?LUE 5/5 except 4/5 IO and HG ? ?Disposition: Discharge disposition: 01-Home or Self Care ? ? ? ? ? ? ? ?Allergies as of 02/09/2022   ? ?   Reactions  ? Bee Pollen Anaphylaxis  ? Bee Venom Anaphylaxis  ? Eggs Or Egg-derived Products Anaphylaxis  ? Keflex [cephalexin] Anaphylaxis  ? Lac Bovis Anaphylaxis  ? Milk-related Compounds Anaphylaxis  ? Penicillins Anaphylaxis  ? Has patient had a PCN reaction causing immediate rash, facial/tongue/throat swelling, SOB or lightheadedness with hypotension: No ?Has patient had a PCN reaction causing severe rash involving mucus membranes or skin necrosis: No ?Has patient had a PCN reaction that required hospitalization No ?Has patient had a PCN reaction occurring within the last 10 years: Yes ?If all of the above answers are "NO", then may proceed with Cephalosporin use.  ? Red Dye Shortness Of Breath  ? Shellfish Allergy Anaphylaxis, Other (See Comments)  ? Soy Allergy Anaphylaxis  ?  Tilactase Anaphylaxis, Other (See Comments)  ? Topamax [topiramate]   ? Diarrhea   ? Wheat Bran Anaphylaxis  ? Yellow Dyes (non-tartrazine) Shortness Of Breath  ? Ferrous Gluconate Diarrhea, Nausea And Vomiting  ? Gabapentin Other (See Comments)  ? Hydrocodone Hives  ? Iron Diarrhea  ? Butalbital-apap-caffeine Other (See Comments)  ? Rebound headache ?Rebound headache  ? Codeine Rash  ? Iodine Rash  ? Latex Itching, Rash  ? Meclizine Other (See Comments)  ? 'Acts crazy', insomnia  ? Robaxin [methocarbamol] Other (See Comments)  ? All muscle relaxants cause her legs to jump  ? ?  ? ?  ?Medication List  ?  ? ?STOP taking these medications   ? ?Biotin 10000 MCG Tabs ?  ?Cosentyx 150 MG/ML Sosy ?Generic drug: Secukinumab ?  ? ?  ? ?TAKE these medications   ? ?albuterol 108 (90 Base) MCG/ACT inhaler ?Commonly known as: VENTOLIN HFA ?Inhale 2 puffs into the lungs every 6 (six) hours as needed for wheezing or shortness of breath. ?  ?ALPRAZolam 0.5 MG tablet ?Commonly known as: Xanax ?Take 1 tablet (0.5 mg total) by mouth as directed. Take it only once a day as needed for severe panic symptoms ?  ?azelastine 0.05 % ophthalmic solution ?Commonly known as: OPTIVAR ?Place 1 drop into both eyes daily as needed (matted eyes in the morning). ?  ?azelastine 0.1 % nasal spray ?Commonly known as: ASTELIN ?Place 1 spray into both nostrils 2 (two) times daily. ?  ?cetirizine 10 MG tablet ?Commonly known as: ZYRTEC ?Take 20 mg by mouth 2 (two) times daily. ?  ?  CRANBERRY PO ?Take 1 capsule by mouth at bedtime. ?  ?desvenlafaxine 100 MG 24 hr tablet ?Commonly known as: Pristiq ?Take 1 tablet (100 mg total) by mouth daily. ?  ?EPINEPHrine 0.3 mg/0.3 mL Soaj injection ?Commonly known as: EPI-PEN ?Inject 0.3 mLs (0.3 mg total) into the muscle once. ?  ?fexofenadine-pseudoephedrine 180-240 MG 24 hr tablet ?Commonly known as: ALLEGRA-D 24 ?Take 1 tablet by mouth daily as needed (allergies). ?  ?hydrochlorothiazide 25 MG tablet ?Commonly  known as: HYDRODIURIL ?Take 25 mg by mouth daily. ?  ?levocetirizine 5 MG tablet ?Commonly known as: XYZAL ?Take 10 mg by mouth 2 (two) times daily. ?  ?levothyroxine 50 MCG tablet ?Commonly known as: SYNTHROID ?Take 50-100 mcg by mouth See admin instructions. Take 100 mcg by mouth every other day, alternating with 50 mcg on alternate days. ?  ?methocarbamol 500 MG tablet ?Commonly known as: Robaxin ?Take 1 tablet (500 mg total) by mouth every 8 (eight) hours as needed for muscle spasms. ?  ?montelukast 10 MG tablet ?Commonly known as: SINGULAIR ?Take 10 mg by mouth at bedtime. ?  ?MULTIPLE VITAMIN PO ?Take 1 tablet by mouth daily. ?  ?nitrofurantoin (macrocrystal-monohydrate) 100 MG capsule ?Commonly known as: MACROBID ?Take 100 mg by mouth daily as needed (after being in water). ?  ?omalizumab 150 MG injection ?Commonly known as: Arvid Right ?Inject 300 mg into the skin every 21 ( twenty-one) days. ?  ?oxyCODONE-acetaminophen 5-325 MG tablet ?Commonly known as: Percocet ?Take 1 tablet by mouth every 4 (four) hours as needed for up to 5 days for severe pain. ?  ?polyethylene glycol 17 g packet ?Commonly known as: MiraLax ?Take 17 g by mouth daily. ?What changed: when to take this ?  ?potassium chloride 10 MEQ tablet ?Commonly known as: KLOR-CON ?Take 10 mEq by mouth daily as needed. Body cramping ?  ?Spiriva Respimat 1.25 MCG/ACT Aers ?Generic drug: Tiotropium Bromide Monohydrate ?Inhale 2 puffs into the lungs in the morning. ?  ?vitamin B-12 1000 MCG tablet ?Commonly known as: CYANOCOBALAMIN ?Take 1,000 mcg by mouth daily. ?  ?vitamin C 500 MG tablet ?Commonly known as: ASCORBIC ACID ?Take 500 mg by mouth at bedtime. ?  ?Wynzora 0.005-0.064 % Crea ?Generic drug: Calcipotriene-Betameth Diprop ?Apply 1 application topically in the morning and at bedtime. ?What changed:  ?when to take this ?reasons to take this ?  ? ?  ? ? Follow-up Information   ? ? Kristine Dicker, PA Follow up in 2 week(s).   ?Why: for incision  check. Appointment date and time should be on pre-op paperwork ?Contact information: ?[place] [address] ?[phone] ? ? ?  ?  ? ?  ?  ? ?  ? ? ?Signed: ?Kristine [name] ?02/09/2022, 5:04 PM ? ? ?

## 2022-02-09 NOTE — Discharge Instructions (Addendum)
?Your surgeon has performed an operation on your cervical spine (neck) to relieve pressure on the spinal cord and/or nerves. This involved making an incision in the front of your neck and removing one or more of the discs that support your spine. Next, a small piece of bone, a titanium plate, and screws were used to fuse two or more of the vertebrae (bones) together. ? ?The following are instructions to help in your recovery once you have been discharged from the hospital. Even if you feel well, it is important that you follow these activity guidelines. If you do not let your neck heal properly from the surgery, you can increase the chance of return of your symptoms and other complications. ? ?* Do not take anti-inflammatory medications for 3 months after surgery (naproxen [Aleve], ibuprofen [Advil, Motrin], etc.). These medications can prevent your bones from healing properly. ?*Hold Cosentyx for 3 weeks post-op ? ?Activity  ?  ?No bending, lifting, or twisting (?BLT?). Avoid lifting objects heavier than 10 pounds (gallon milk jug).  Where possible, avoid household activities that involve lifting, bending, reaching, pushing, or pulling such as laundry, vacuuming, grocery shopping, and childcare. Try to arrange for help from friends and family for these activities while your back heals. ? ?Increase physical activity slowly as tolerated.  Taking short walks is encouraged, but avoid strenuous exercise. Do not jog, run, bicycle, lift weights, or participate in any other exercises unless specifically allowed by your doctor. ? ?Talk to your doctor before resuming sexual activity. ? ?You should not drive until cleared by your doctor. ? ?Until released by your doctor, you should not return to work or school.  You should rest at home and let your body heal.  ? ?You may shower three days after your surgery.  After showering, lightly dab your incision dry. Do not take a tub bath or go swimming until approved by your doctor  at your follow-up appointment. ? ?If your doctor ordered a cervical collar (neck brace) for you, you should wear it whenever you are out of bed. You may remove it when lying down or sleeping, but you should wear it at all other times. Not all neck surgeries require a cervical collar. ? ?If you smoke, we strongly recommend that you quit.  Smoking has been proven to interfere with normal bone healing and will dramatically reduce the success rate of your surgery. Please contact [facility] (800-QUIT-NOW) and use the resources at www.[facility].com for assistance in stopping smoking. ? ?Surgical Incision ?  ? Keep your incision area clean and dry. ? ?Your incision was closed with Dermabond glue. The glue should begin to peel away within about a week. ?Diet          ? ?You may return to your usual diet. However, you may experience discomfort when swallowing in the first month after your surgery. This is normal. You may find that softer foods are more comfortable for you to swallow. Be sure to stay hydrated. ? ?When to Contact us ? ?You may experience pain in your neck and/or pain between your shoulder blades. This is normal and should improve in the next few weeks with the help of pain medication, muscle relaxers, and rest. Some patients report that a warm compress on the back of the neck or between the shoulder blades helps. ? ?However, should you experience any of the following, contact us immediately: ?New numbness or weakness ?Pain that is progressively getting worse, and is not relieved by your pain medication,  muscle relaxers, rest, and warm compresses ?Bleeding, redness, swelling, pain, or drainage from surgical incision ?Chills or flu-like symptoms ?Fever greater than 101.0 F (38.3 C) ?Inability to eat, drink fluids, or take medications ?Problems with bowel or bladder functions ?Difficulty breathing or shortness of breath ?Warmth, tenderness, or swelling in your calf ?Contact Information ?During office hours  (Monday-Friday 9 am to 5 pm), please call your physician at [phone] and ask for Berdine Addison ?After hours and weekends, please call [phone] and speak with the answering service, who will contact the doctor on call.  If that fails, call the [place] Operator at [phone] and ask for the Neurosurgery Resident On Call  ?For a life-threatening emergency, call 911 ?  ?AMBULATORY SURGERY  ?DISCHARGE INSTRUCTIONS ? ? ?The drugs that you were given will stay in your system until tomorrow so for the next 24 hours you should not: ? ?Drive an automobile ?Make any legal decisions ?Drink any alcoholic beverage ? ? ?You may resume regular meals tomorrow.  Today it is better to start with liquids and gradually work up to solid foods. ? ?You may eat anything you prefer, but it is better to start with liquids, then soup and crackers, and gradually work up to solid foods. ? ? ?Please notify your doctor immediately if you have any unusual bleeding, trouble breathing, redness and pain at the surgery site, drainage, fever, or pain not relieved by medication. ? ? ? ?Additional Instructions: ?May take '500mg'$  of tylenol every 8 hours to support percocet. ? ? ? ? ? ? ?Please contact your physician with any problems or Same Day Surgery at [phone], Monday through Friday 6 am to 4 pm, or [place] at [facility] number at [phone].  ?

## 2022-03-13 ENCOUNTER — Encounter: Payer: Self-pay | Admitting: Physical Therapy

## 2022-03-13 ENCOUNTER — Ambulatory Visit: Payer: BC Managed Care – PPO | Attending: Neurosurgery | Admitting: Physical Therapy

## 2022-03-13 DIAGNOSIS — M6281 Muscle weakness (generalized): Secondary | ICD-10-CM | POA: Insufficient documentation

## 2022-03-13 DIAGNOSIS — M542 Cervicalgia: Secondary | ICD-10-CM | POA: Diagnosis present
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
## 2022-03-14 ENCOUNTER — Encounter: Payer: Self-pay | Admitting: Physical Therapy

## 2022-03-14 ENCOUNTER — Ambulatory Visit: Payer: BC Managed Care – PPO | Admitting: Physical Therapy

## 2022-03-14 DIAGNOSIS — M542 Cervicalgia: Secondary | ICD-10-CM

## 2022-03-14 DIAGNOSIS — M6281 Muscle weakness (generalized): Secondary | ICD-10-CM

## 2022-03-14 NOTE — Therapy (Signed)
[place] [facility] [facility] ?[address] ?[place], [place], [address] ?Phone: [phone]   Fax:  [phone] ? ?Physical Therapy Treatment ? ?Patient Details  ?Name: Kristine [name] ?MRN: [id number] ?Date of Birth: Mar 10, 1971 ?No data recorded ? ?Encounter Date: 03/14/2022 ? ? PT End of Session - 03/14/22 1704   ? ? Visit Number 2   ? Number of Visits 13   ? Date for PT Re-Evaluation 04/24/22   ? PT Start Time 1350   ? PT Stop Time 1430   ? PT Time Calculation (min) 40 min   ? Activity Tolerance Patient limited by pain   ? Behavior During Therapy [facility] for tasks assessed/performed   ? ?  ?  ? ?  ? ? ?Past Medical History:  ?Diagnosis Date  ? Abnormal weight gain 08/05/2013  ? Absence of interventricular septum 08/05/2013  ? Allergic rhinitis 04/17/2016  ? Allergic urticaria due to ingested food 02/09/2016  ? Anxiety   ? Asthma   ? Cancer [facility])   ? melanoma-left eye and right hand  ? Clinical depression 09/30/2013  ? Complication of anesthesia   ? PT HAD TROUBLE BREATHING AFTER HYSTERECTOMY IN OCTOBER  ? Depression   ? Dizziness   ? Excess weight 09/30/2013  ? GERD (gastroesophageal reflux disease)   ? Headache, migraine 09/30/2013  ? Heart murmur   ? Heavy drinker 04/11/2014  ? Overview:  Patient reports drinking 2-3 bottles of wine each weekend.    ? History of methicillin resistant staphylococcus aureus (MRSA) 11/2015  ? Hypothyroidism   ? Infection of urinary tract 09/30/2013  ? Irregular bleeding 09/30/2013  ? Multiple allergies   ? Pneumonia   ? Pre-diabetes   ? VSD (ventricular septal defect and aortic arch hypoplasia   ? ? ?Past Surgical History:  ?Procedure Laterality Date  ? ANTERIOR CERVICAL DECOMP/DISCECTOMY FUSION N/A 02/09/2022  ? Procedure: C5-6 ANTERIOR CERVICAL DISCECTOMY AND FUSION (GLOBUS HEDRON);  Surgeon: Meade Maw, MD;  Location: [facility] ORS;  Service: Neurosurgery;  Laterality: N/A;  ? BLEPHAROPLASTY  2019  ? BOTOX INJECTION N/A 10/23/2016  ?  Procedure: BOTOX INJECTION;  Surgeon: Royston Cowper, MD;  Location: [facility] ORS;  Service: Urology;  Laterality: N/A;  ? BREAST BIOPSY Left 11/13/2013  ? benign  ? CESAREAN SECTION    ? x2  ? COLONOSCOPY  2006  ? COLONOSCOPY WITH PROPOFOL N/A 07/04/2020  ? Procedure: COLONOSCOPY WITH PROPOFOL;  Surgeon: Lesly Rubenstein, MD;  Location: [facility] ENDOSCOPY;  Service: Endoscopy;  Laterality: N/A;  ? COMBINED REDUCTION MAMMAPLASTY W/ ABDOMINOPLASTY Bilateral 05/2021  ? CORONARY ANGIOPLASTY  2013  ? COSMETIC SURGERY  06/09/2021  ? tummy tuck  ? CYSTOSCOPY N/A 10/23/2016  ? Procedure: CYSTOSCOPY;  Surgeon: Royston Cowper, MD;  Location: [facility] ORS;  Service: Urology;  Laterality: N/A;  ? CYSTOSCOPY WITH HYDRODISTENSION AND BIOPSY    ? ESOPHAGOGASTRODUODENOSCOPY    ? LAPAROSCOPIC ASSISTED VAGINAL HYSTERECTOMY N/A 08/20/2016  ? Procedure: LAPAROSCOPIC ASSISTED VAGINAL HYSTERECTOMY;  Surgeon: Boykin Nearing, MD;  Location: [facility] ORS;  Service: Gynecology;  Laterality: N/A;  ? LAPAROSCOPIC BILATERAL SALPINGECTOMY Bilateral 08/20/2016  ? Procedure: LAPAROSCOPIC BILATERAL SALPINGECTOMY;  Surgeon: Boykin Nearing, MD;  Location: [facility] ORS;  Service: Gynecology;  Laterality: Bilateral;  ? NASAL SINUS SURGERY    ? x12  ? TONSILLECTOMY    ? TUBAL LIGATION  2003  ? ? ?There were no vitals filed for this visit. ? ? Subjective Assessment - 03/14/22 1352   ? ?  Subjective Pt reports 7-8/10 pain in posterior neck and traps. She has a shooting pain behind her left ear that is causing her head to hurt more than usual. She had spots in her vision earlier - she suspects she will have a migraine later today.   ? Pertinent History Pt arrives to PT s/p ACDF surgery on 02/09/22 at level C5-C6 -- referred to PT specifically for dry needling. She states she has a phobia of needles however has had DN in the past. She was okay with DN of upper traps but did not handle DN to cervical region well and did not return to PT following that visit  (went to [facility]). Majority of pain is described in bilateral upper traps, bilateral SCM worst near mastoid process, posterior cervical region, and left jaw. She does report having TMJ. She also endorses frequent migraines; she does receive Botox injections every 3 months by Dr. Brigitte Pulse at [facility]. Her next scheduled injection is in June. She reports N&T in bilateral hands and feet for the last 6-7 years, she reports symptoms remain present following surgery. She continues to experience decreased dexterity in bilateral hands - states her husband helps with buttoning shorts/pants, tieing shoes and other dressing/hygiene activities. Before surgery,  she was dropping a lot of objects due to weak grip strength. Since surgery that has improved a lot. Prior to surgery, pt was visiting a chiropractor and massage therapist regularly to address chronic muscle tightness. She reports having severely limited cervical ROM for years and gaining ROM is not a goal of PT. Pt goal for PT is to be pain free. She reports being allergic to pain meds - causing skin irritation such as rashes and itching. She is refusing further meds related to pain control. Currently, pt is not sleeping well due to pain, unable to get comfortable in side lying and cannot breath due to sinuses when supine. 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 She has been having difficulty at work due to bilateral shoulder weakness and fatigue. PMH includes: VSD (ventricular septal defect), allergic rhinitis, UTI, depression, migraines, polyneuropathy, bilateral carpal tunnel syndrome, ADD, anxiety, basal cell carcinoma, fibroids, GERD.   ? Limitations Lifting;Other (comment);Reading;Writing;House hold activities   work  ? How long can you sit comfortably? unlimited   ? How long can you stand comfortably? unlimited   ? How long can you walk comfortably? not limited by cervical pain    ? Patient Stated Goals to be pain free and not have to take additional medications for pain control   ? Currently in Pain? Yes   ? Pain Score 8    ? Pain Location Neck   ? Pain Orientation Right;Left;Posterior   ? Pain Descriptors / Research scientist (life sciences);Sharp;Stabbing   ? Pain Type Chronic pain   ? Pain Onset More than a month ago   ? ?  ?  ? ?  ? ? ? ? ? ?OBECTIVE MEASURES: ? ?-Grip strength: LUE 55#, RUE 5# ?-NDI: 33/50 (66%) ?-FOTO: 33 ? ? ?INTERVENTIONS ? ?STM utilizing effleurage, p?trissage and sustained pressure trigger point release to bilateral traps and cervical paraspinals. Graded pressure, slowly increasing to pt tolerance.  ? ?Trigger Point Dry Needling (TDN), unbilled. Education performed with patient regarding potential benefit of TDN. Reviewed precautions and risks with patient. Pt provided verbal consent to treatment. TDN performed with 0.25 x 60 single needle placements with local twitch response (LTR). Pistoning technique  utilized; pt sensitive to pistoning so included multiple static holds for pt comfort. Muscles targeted: bilateral upper traps. ?Decreased sympathetic symptoms this session. Pt reporting pain following treatment. Limited tolerance demonstrated. ? ?Seated stretches to bilateral upper traps, PT guiding movement. Very limited ROM. Pt reporting pain. x45 seconds each. ? ? ? ? ?Clinical Impression: Pt limited by pain throughout session. Initiated with gentle STM to induce relaxation and decrease muscle tension. Mild improvement in bilateral traps; palpable trigger points remain throughout traps and cervical musculature. Pt requesting dry needling - traps became tense as soon as the needle was inserted reversing the work of the [employer]. PT discussed other treatment options and that pt may not be appropriate for DN. Pt states she reacts better when she takes Xanax but is not able to with driving and work. Pt agreeable to alternate methods next session without DN. Pt will benefit from skilled  PT services to address deficits and return to pain-free function at home and work.  ? ? ? ? ? ? ? ? ? ? ? ? ? ? ? ? PT Short Term Goals - 07/15/19 1752   ? ?  ? PT SHORT TERM GOAL #1  ? Title Patient w

## 2022-03-20 ENCOUNTER — Encounter: Payer: Self-pay | Admitting: Physical Therapy

## 2022-03-20 ENCOUNTER — Ambulatory Visit: Payer: BC Managed Care – PPO | Admitting: Physical Therapy

## 2022-03-20 DIAGNOSIS — M542 Cervicalgia: Secondary | ICD-10-CM

## 2022-03-20 DIAGNOSIS — M6281 Muscle weakness (generalized): Secondary | ICD-10-CM

## 2022-03-20 NOTE — Therapy (Signed)
[facility] ?[address] ?[place], [place], [address] ?Phone: [phone]   Fax:  [phone] ? ?Physical Therapy Treatment ? ?Patient Details  ?Name: Kristine [name] ?MRN: [id number] ?Date of Birth: December 24, 1970 ?No data recorded ? ?Encounter Date: 03/20/2022 ? ? PT End of Session - 03/20/22 1147   ? ? Visit Number 3   ? Number of Visits 13   ? Date for PT Re-Evaluation 04/24/22   ? Authorization Type 6/10 PN on 07/22/19   ? Authorization - Visit Number 3   ? Authorization - Number of Visits 13   ? Progress Note Due on Visit 10   ? PT Start Time 1130   ? PT Stop Time 1208   ? PT Time Calculation (min) 38 min   ? Activity Tolerance Patient limited by pain   ? Behavior During Therapy [facility] for tasks assessed/performed   ? ?  ?  ? ?  ? ? ?Past Medical History:  ?Diagnosis Date  ? Abnormal weight gain 08/05/2013  ? Absence of interventricular septum 08/05/2013  ? Allergic rhinitis 04/17/2016  ? Allergic urticaria due to ingested food 02/09/2016  ? Anxiety   ? Asthma   ? Cancer [facility])   ? melanoma-left eye and right hand  ? Clinical depression 09/30/2013  ? Complication of anesthesia   ? PT HAD TROUBLE BREATHING AFTER HYSTERECTOMY IN OCTOBER  ? Depression   ? Dizziness   ? Excess weight 09/30/2013  ? GERD (gastroesophageal reflux disease)   ? Headache, migraine 09/30/2013  ? Heart murmur   ? Heavy drinker 04/11/2014  ? Overview:  Patient reports drinking 2-3 bottles of wine each weekend.    ? History of methicillin resistant staphylococcus aureus (MRSA) 11/2015  ? Hypothyroidism   ? Infection of urinary tract 09/30/2013  ? Irregular bleeding 09/30/2013  ? Multiple allergies   ? Pneumonia   ? Pre-diabetes   ? VSD (ventricular septal defect and aortic arch hypoplasia   ? ? ?Past Surgical History:  ?Procedure Laterality Date  ? ANTERIOR CERVICAL DECOMP/DISCECTOMY FUSION N/A 02/09/2022  ? Procedure: C5-6 ANTERIOR CERVICAL DISCECTOMY AND FUSION (GLOBUS HEDRON);  Surgeon:  Meade Maw, MD;  Location: [facility] ORS;  Service: Neurosurgery;  Laterality: N/A;  ? BLEPHAROPLASTY  2019  ? BOTOX INJECTION N/A 10/23/2016  ? Procedure: BOTOX INJECTION;  Surgeon: Royston Cowper, MD;  Location: [facility] ORS;  Service: Urology;  Laterality: N/A;  ? BREAST BIOPSY Left 11/13/2013  ? benign  ? CESAREAN SECTION    ? x2  ? COLONOSCOPY  2006  ? COLONOSCOPY WITH PROPOFOL N/A 07/04/2020  ? Procedure: COLONOSCOPY WITH PROPOFOL;  Surgeon: Lesly Rubenstein, MD;  Location: [facility] ENDOSCOPY;  Service: Endoscopy;  Laterality: N/A;  ? COMBINED REDUCTION MAMMAPLASTY W/ ABDOMINOPLASTY Bilateral 05/2021  ? CORONARY ANGIOPLASTY  2013  ? COSMETIC SURGERY  06/09/2021  ? tummy tuck  ? CYSTOSCOPY N/A 10/23/2016  ? Procedure: CYSTOSCOPY;  Surgeon: Royston Cowper, MD;  Location: [facility] ORS;  Service: Urology;  Laterality: N/A;  ? CYSTOSCOPY WITH HYDRODISTENSION AND BIOPSY    ? ESOPHAGOGASTRODUODENOSCOPY    ? LAPAROSCOPIC ASSISTED VAGINAL HYSTERECTOMY N/A 08/20/2016  ? Procedure: LAPAROSCOPIC ASSISTED VAGINAL HYSTERECTOMY;  Surgeon: Boykin Nearing, MD;  Location: [facility] ORS;  Service: Gynecology;  Laterality: N/A;  ? LAPAROSCOPIC BILATERAL SALPINGECTOMY Bilateral 08/20/2016  ? Procedure: LAPAROSCOPIC BILATERAL SALPINGECTOMY;  Surgeon: Boykin Nearing, MD;  Location: [facility] ORS;  Service: Gynecology;  Laterality: Bilateral;  ? NASAL SINUS  SURGERY    ? x12  ? TONSILLECTOMY    ? TUBAL LIGATION  2003  ? ? ?There were no vitals filed for this visit. ? ? Subjective Assessment - 03/20/22 1131   ? ? Subjective Pt reports she can feel a migraine coming on and has "nerve pain" on her left side. Reports 7/10 pain today.   ? Pertinent History Pt arrives to PT s/p ACDF surgery on 02/09/22 at level C5-C6 -- referred to PT specifically for dry needling. She states she has a phobia of needles however has had DN in the past. She was okay with DN of upper traps but did not handle DN to cervical region well and did not return to PT  following that visit (went to [facility]). Majority of pain is described in bilateral upper traps, bilateral SCM worst near mastoid process, posterior cervical region, and left jaw. She does report having TMJ. She also endorses frequent migraines; she does receive Botox injections every 3 months by Dr. Brigitte Pulse at [facility]. Her next scheduled injection is in June. She reports N&T in bilateral hands and feet for the last 6-7 years, she reports symptoms remain present following surgery. She continues to experience decreased dexterity in bilateral hands - states her husband helps with buttoning shorts/pants, tieing shoes and other dressing/hygiene activities. Before surgery,  she was dropping a lot of objects due to weak grip strength. Since surgery that has improved a lot. Prior to surgery, pt was visiting a chiropractor and massage therapist regularly to address chronic muscle tightness. She reports having severely limited cervical ROM for years and gaining ROM is not a goal of PT. Pt goal for PT is to be pain free. She reports being allergic to pain meds - causing skin irritation such as rashes and itching. She is refusing further meds related to pain control. Currently, pt is not sleeping well due to pain, unable to get comfortable in side lying and cannot breath due to sinuses when supine. 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 She has been having difficulty at work due to bilateral shoulder weakness and fatigue. PMH includes: VSD (ventricular septal defect), allergic rhinitis, UTI, depression, migraines, polyneuropathy, bilateral carpal tunnel syndrome, ADD, anxiety, basal cell carcinoma, fibroids, GERD.   ? Limitations Lifting;Other (comment);Reading;Writing;House hold activities   ? How long can you sit comfortably? unlimited   ? How long can you stand comfortably? unlimited   ? How long can you walk comfortably? not limited by  cervical pain   ? Patient Stated Goals to be pain free and not have to take additional medications for pain control   ? Pain Onset More than a month ago   ? Pain Onset More than a month ago   ? ?  ?  ? ?  ? ? ? ? ? ?Manual ?STM with trigger point release to bilat UT and pec minor ?\ ?Ther-Ex ?Prone hands behind head scap retractions (elbow lift) 2x 8 with cuing for scapular retraction without elevation with good carry over ?Posterior shoulder rolls x12 ?UT stretch without UE "pulling" x15sec each side ?Thoracic ext over towel hands behind head x12 cuing for breath control with good carry over ?FWD ball roll sitting x12 with breath control ? ?Education on feedback loop of pain and completing therex to point of pain and not past for desensitization with understanding.  ? ? ? ? ? ? ? ? ? ? ? ? ? ? ? ? ? ? ? ? ? ? ?  PT Education - 03/20/22 1147   ? ? Education Details therex form/technqiue, posture   ? Person(s) Educated Patient   ? Methods Explanation;Demonstration;Verbal cues   ? Comprehension Verbalized understanding;Returned demonstration;Verbal cues required   ? ?  ?  ? ?  ? ? ? PT Short Term Goals - 07/15/19 1752   ? ?  ? PT SHORT TERM GOAL #1  ? Title Patient will be independent in home exercise program to improve strength/mobility for better functional independence with ADLs.   ? Baseline 7/6: HEP given 9/2: HEp compliant   ? Time 2   ? Period Weeks   ? Status Partially Met   ? Target Date 07/29/19   ? ?  ?  ? ?  ? ? ? ? PT Long Term Goals - 03/13/22 1702   ? ?  ? PT LONG TERM GOAL #1  ? Title Pt will decrease worst neck pain as reported on NPRS by at least 2 points in order to demonstrate clinically significant reduction in back pain.   ? Baseline 03/13/22: 10/10   ? Time 6   ? Period Weeks   ? Status New   ? Target Date 04/24/22   ?  ? PT LONG TERM GOAL #2  ? Title Pt will demonstrate decrease in NDI by at least 19% in order to demonstrate clinically significant reduction in disability related to neck  injury/pain   ? Baseline 03/13/22: next session   ? Time 6   ? Period Weeks   ? Status New   ? Target Date 04/24/22   ?  ? PT LONG TERM GOAL #3  ? Title Patient will increase FOTO score to equal to or greater th

## 2022-03-21 ENCOUNTER — Ambulatory Visit (INDEPENDENT_AMBULATORY_CARE_PROVIDER_SITE_OTHER): Payer: BC Managed Care – PPO | Admitting: Dermatology

## 2022-03-21 DIAGNOSIS — L409 Psoriasis, unspecified: Secondary | ICD-10-CM

## 2022-03-21 DIAGNOSIS — L4050 Arthropathic psoriasis, unspecified: Secondary | ICD-10-CM | POA: Diagnosis not present

## 2022-03-21 DIAGNOSIS — L821 Other seborrheic keratosis: Secondary | ICD-10-CM

## 2022-03-21 DIAGNOSIS — L820 Inflamed seborrheic keratosis: Secondary | ICD-10-CM | POA: Diagnosis not present

## 2022-03-21 DIAGNOSIS — L905 Scar conditions and fibrosis of skin: Secondary | ICD-10-CM

## 2022-03-21 DIAGNOSIS — Z79899 Other long term (current) drug therapy: Secondary | ICD-10-CM | POA: Diagnosis not present

## 2022-03-21 MED ORDER — WYNZORA 0.005-0.064 % EX CREA: 1.0000 "application " | TOPICAL_CREAM | Freq: Two times a day (BID) | CUTANEOUS | 6 refills | Status: DC

## 2022-03-21 NOTE — Patient Instructions (Signed)

## 2022-03-21 NOTE — Progress Notes (Signed)
   Follow-Up Visit   Subjective  Kristine [name] is a 51 y.o. female who presents for the following: Skin Problem (The patient has spots, moles and lesions to be evaluated, some may be new or changing and the patient has concerns that these could be cancer. ) and Psoriasis (8 months on Psoriasis on body, pt taking Consentyx once a month prescribed by her Rheumatologist for psoriatic arthritis and using Wynzora cream with a good response. ). Pt had disc surgery a few months ago now she has a scar on her chest.   The following portions of the chart were reviewed this encounter and updated as appropriate:   Tobacco  Allergies  Meds  Problems  Med Hx  Surg Hx  Fam Hx     Review of Systems:  No other skin or systemic complaints except as noted in HPI or Assessment and Plan.  Objective  Well appearing patient in no apparent distress; mood and affect are within normal limits.  A focused examination was performed including face,chest,back,arms,legs . Relevant physical exam findings are noted in the Assessment and Plan.  trunk,exts Pink scaly patches of the left wrist; light pink scaly patch of the left chest.  arms,chest Stuck-on, waxy, tan-brown papule  hips, chest Dyspigmented smooth macule or patch.    Assessment & Plan  Psoriasis of the skin with psoriatic arthritis of the joints. Currently on Cosentyx injectable biologic prescribed by rheumatologist. Patient has persistent skin psoriasis trunk,exts Chronic and persistent condition with duration or expected duration over one year. Condition is symptomatic/ bothersome to patient. Not currently at goal.  Psoriasis is a chronic non-curable, but treatable genetic/hereditary disease that may have other systemic features affecting other organ systems such as joints (Psoriatic Arthritis). It is associated with an increased risk of inflammatory bowel disease, heart disease, non-alcoholic fatty liver disease, and depression.    Long term  medication management.  Patient is using long term (months to years) prescription medication  to control their dermatologic condition.  These medications require periodic monitoring to evaluate for efficacy and side effects and may require periodic laboratory monitoring.  Cont Cosentyx injection once a month  May consider Vtama cream or Zoryve in the future.   We will need to see lab results at next office visit, TB skin test   Related Medications Calcipotriene-Betameth Diprop (WYNZORA) 0.005-0.064 % CREA Apply 1 application. topically in the morning and at bedtime.  Inflamed seborrheic keratosis arms,chest Pt decline treatment today, she is going out of town in 4 days   Scar hips, chest May consider Fraxel laser in the future with Sonia Baller  Samples of Serica given  Seborrheic Keratoses - Stuck-on, waxy, tan-brown papules and/or plaques  - Benign-appearing - Discussed benign etiology and prognosis. - Observe - Call for any changes  Return in about 6 months (around 09/21/2022) for Psoriasis, SKs.  I, Marye Round, CMA, am acting as scribe for Sarina Ser, MD .  Documentation: I have reviewed the above documentation for accuracy and completeness, and I agree with the above.  Sarina Ser, MD

## 2022-03-22 ENCOUNTER — Ambulatory Visit: Payer: BC Managed Care – PPO | Admitting: Physical Therapy

## 2022-03-26 ENCOUNTER — Encounter: Payer: BC Managed Care – PPO | Admitting: Physical Therapy

## 2022-03-28 ENCOUNTER — Encounter: Payer: BC Managed Care – PPO | Admitting: Physical Therapy

## 2022-04-02 ENCOUNTER — Ambulatory Visit: Payer: BC Managed Care – PPO

## 2022-04-02 DIAGNOSIS — M6281 Muscle weakness (generalized): Secondary | ICD-10-CM

## 2022-04-02 DIAGNOSIS — M542 Cervicalgia: Secondary | ICD-10-CM | POA: Diagnosis not present

## 2022-04-02 NOTE — Therapy (Signed)
[facility] [address] [place], [address] Phone: [phone]   Fax:  [phone]  Physical Therapy Treatment  Patient Details  Name: Kristine [name] MRN: [id number] Date of Birth: 1971/01/10 No data recorded  Encounter Date: 04/02/2022   PT End of Session - 04/02/22 1557     Visit Number 4    Number of Visits 13    Date for PT Re-Evaluation 04/24/22    Authorization Type 6/10 PN on 07/22/19    Authorization - Visit Number 4    Authorization - Number of Visits 13    Progress Note Due on Visit 10    PT Start Time 5397    PT Stop Time 6734    PT Time Calculation (min) 48 min    Activity Tolerance Patient limited by pain    Behavior During Therapy [facility] for tasks assessed/performed             Past Medical History:  Diagnosis Date   Abnormal weight gain 08/05/2013   Absence of interventricular septum 08/05/2013   Allergic rhinitis 04/17/2016   Allergic urticaria due to ingested food 02/09/2016   Anxiety    Asthma    Cancer ([place])    melanoma-left eye and right hand   Clinical depression 19/37/9024   Complication of anesthesia    PT HAD TROUBLE BREATHING AFTER HYSTERECTOMY IN OCTOBER   Depression    Dizziness    Excess weight 09/30/2013   GERD (gastroesophageal reflux disease)    Headache, migraine 09/30/2013   Heart murmur    Heavy drinker 04/11/2014   Overview:  Patient reports drinking 2-3 bottles of wine each weekend.     History of methicillin resistant staphylococcus aureus (MRSA) 11/2015   Hypothyroidism    Infection of urinary tract 09/30/2013   Irregular bleeding 09/30/2013   Multiple allergies    Pneumonia    Pre-diabetes    VSD (ventricular septal defect and aortic arch hypoplasia     Past Surgical History:  Procedure Laterality Date   ANTERIOR CERVICAL DECOMP/DISCECTOMY FUSION N/A 02/09/2022   Procedure: C5-6 ANTERIOR CERVICAL DISCECTOMY AND FUSION (GLOBUS HEDRON);   Surgeon: Meade Maw, MD;  Location: [facility] ORS;  Service: Neurosurgery;  Laterality: N/A;   BLEPHAROPLASTY  2019   BOTOX INJECTION N/A 10/23/2016   Procedure: BOTOX INJECTION;  Surgeon: Royston Cowper, MD;  Location: [facility] ORS;  Service: Urology;  Laterality: N/A;   BREAST BIOPSY Left 11/13/2013   benign   CESAREAN SECTION     x2   COLONOSCOPY  2006   COLONOSCOPY WITH PROPOFOL N/A 07/04/2020   Procedure: COLONOSCOPY WITH PROPOFOL;  Surgeon: Lesly Rubenstein, MD;  Location: [facility] ENDOSCOPY;  Service: Endoscopy;  Laterality: N/A;   COMBINED REDUCTION MAMMAPLASTY W/ ABDOMINOPLASTY Bilateral 05/2021   CORONARY ANGIOPLASTY  2013   COSMETIC SURGERY  06/09/2021   tummy tuck   CYSTOSCOPY N/A 10/23/2016   Procedure: CYSTOSCOPY;  Surgeon: Royston Cowper, MD;  Location: [facility] ORS;  Service: Urology;  Laterality: N/A;   CYSTOSCOPY WITH HYDRODISTENSION AND BIOPSY     ESOPHAGOGASTRODUODENOSCOPY     LAPAROSCOPIC ASSISTED VAGINAL HYSTERECTOMY N/A 08/20/2016   Procedure: LAPAROSCOPIC ASSISTED VAGINAL HYSTERECTOMY;  Surgeon: Boykin Nearing, MD;  Location: [facility] ORS;  Service: Gynecology;  Laterality: N/A;   LAPAROSCOPIC BILATERAL SALPINGECTOMY Bilateral 08/20/2016   Procedure: LAPAROSCOPIC BILATERAL SALPINGECTOMY;  Surgeon: Boykin Nearing, MD;  Location: [facility] ORS;  Service: Gynecology;  Laterality: Bilateral;   NASAL SINUS  SURGERY     x12   TONSILLECTOMY     TUBAL LIGATION  2003    There were no vitals filed for this visit.   Subjective Assessment - 04/02/22 1554     Subjective Pt notes she just came from neurology and noted that she has been cleared to have massage therapy.  She was also prescirbed a new medication for her migraines.  Pt continues to speak about her fear of DN and noting that she understands it helps, but still is somewhat concerned about having it performed.  Reports 7/10 pain today upon arrival.    Pertinent History Pt arrives to PT s/p ACDF surgery on 02/09/22  at level C5-C6 -- referred to PT specifically for dry needling. She states she has a phobia of needles however has had DN in the past. She was okay with DN of upper traps but did not handle DN to cervical region well and did not return to PT following that visit (went to [facility]). Majority of pain is described in bilateral upper traps, bilateral SCM worst near mastoid process, posterior cervical region, and left jaw. She does report having TMJ. She also endorses frequent migraines; she does receive Botox injections every 3 months by Dr. Brigitte Pulse at [facility]. Her next scheduled injection is in June. She reports N&T in bilateral hands and feet for the last 6-7 years, she reports symptoms remain present following surgery. She continues to experience decreased dexterity in bilateral hands - states her husband helps with buttoning shorts/pants, tieing shoes and other dressing/hygiene activities. Before surgery,  she was dropping a lot of objects due to weak grip strength. Since surgery that has improved a lot. Prior to surgery, pt was visiting a chiropractor and massage therapist regularly to address chronic muscle tightness. She reports having severely limited cervical ROM for years and gaining ROM is not a goal of PT. Pt goal for PT is to be pain free. She reports being allergic to pain meds - causing skin irritation such as rashes and itching. She is refusing further meds related to pain control. Currently, pt is not sleeping well due to pain, unable to get comfortable in side lying and cannot breath due to sinuses when supine. 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 She has been having difficulty at work due to bilateral shoulder weakness and fatigue. PMH includes: VSD (ventricular septal defect), allergic rhinitis, UTI, depression, migraines, polyneuropathy, bilateral carpal tunnel syndrome, ADD, anxiety, basal cell carcinoma,  fibroids, GERD.    Limitations Lifting;Other (comment);Reading;Writing;House hold activities    How long can you sit comfortably? unlimited    How long can you stand comfortably? unlimited    How long can you walk comfortably? not limited by cervical pain    Patient Stated Goals to be pain free and not have to take additional medications for pain control    Pain Onset More than a month ago    Pain Onset More than a month ago                    Manual  STM with trigger point release to bilat UT and pec minor Supine STM to cervical region to increase extensibility of the paraspinals Supine suboccipital release technique to decrease cervicalgia Supine manual traction performed in order to increase joint space in cervical region for pain relief Supine cervical upglides/downglides, 30 sec bouts Supine UT/Levator stretch, 30 sec bouts  to increase tissue extensibility of the cervical region   Ther-Ex  Posterior shoulder rolls x12 Thoracic ext over towel hands behind head 2x15 with cuing for proper breathing throughout FWD ball roll sitting x15 with cuing for proper breathing throughout           PT Short Term Goals - 07/15/19 1752       PT SHORT TERM GOAL #1   Title Patient will be independent in home exercise program to improve strength/mobility for better functional independence with ADLs.    Baseline 7/6: HEP given 9/2: HEp compliant    Time 2    Period Weeks    Status Partially Met    Target Date 07/29/19               PT Long Term Goals - 03/13/22 1702       PT LONG TERM GOAL #1   Title Pt will decrease worst neck pain as reported on NPRS by at least 2 points in order to demonstrate clinically significant reduction in back pain.    Baseline 03/13/22: 10/10    Time 6    Period Weeks    Status New    Target Date 04/24/22      PT LONG TERM GOAL #2   Title Pt will demonstrate decrease in NDI by at least 19% in order to demonstrate clinically  significant reduction in disability related to neck injury/pain    Baseline 03/13/22: next session    Time 6    Period Weeks    Status New    Target Date 04/24/22      PT LONG TERM GOAL #3   Title Patient will increase FOTO score to equal to or greater than __ to demonstrate statistically significant improvement in mobility and quality of life.    Baseline 03/13/22: next session    Time 6    Period Weeks    Status New    Target Date 04/24/22                   Plan - 04/02/22 1659     Clinical Impression Statement ?  ?Pt responded well to exercises today and manual therapy approach.  Pt did note some increased pain in the L SCM and platysma area with UT stretch and when performing towel stretch.  Will continue to monitor this tightness as therapy progresses to improved ROM and decrease painful symptom response with mobility.    Personal Factors and Comorbidities Comorbidity 3+;Fitness;Past/Current Experience;Profession;Social Background;Time since onset of injury/illness/exacerbation    Comorbidities cervical cancer, bilateral carpal tunnel syndrome, polyneuropathy, depression, migraines, VSD, ADD, anxiety, GERD    Examination-Activity Limitations Bathing;Reach Overhead;Bed Mobility;Dressing;Hygiene/Grooming;Lift    Examination-Participation Restrictions Cleaning;Community Activity;Interpersonal Relationship;Laundry;Shop;Yard Work;Occupation    Stability/Clinical Decision Making Evolving/Moderate complexity    Rehab Potential Fair    PT Frequency 2x / week    PT Duration 6 weeks    PT Treatment/Interventions ADLs/Self Care Home Management;Cryotherapy;Electrical Stimulation;Iontophoresis 106m/ml Dexamethasone;Moist Heat;Ultrasound;Therapeutic activities;Therapeutic exercise;Manual techniques;Passive range of motion;Dry needling;Energy conservation;Patient/family education;Biofeedback;Functional mobility training;Neuromuscular re-education;Vestibular    PT Next Visit Plan gentle STM,  (dry needling?), light stretching, grip strength interventions, scapular stability, cervical stability    PT Home Exercise Plan N/A    Consulted and Agree with Plan of Care Patient             Patient will benefit from skilled therapeutic intervention in order to improve the following deficits and impairments:  Decreased activity tolerance, Decreased endurance, Decreased strength, Impaired  perceived functional ability, Postural dysfunction, Improper body mechanics, Pain, Decreased range of motion, Hypomobility, Impaired sensation, Impaired flexibility  Visit Diagnosis: Cervicalgia  Muscle weakness (generalized)     Problem List Patient Active Problem List   Diagnosis Date Noted   Cervical radicular pain (R>L) 10/11/2021   Spinal stenosis in cervical region 10/11/2021   Cervical spondylosis 10/11/2021   S/P bilateral breast reduction 06/23/2021   Sinusitis accessory 02/03/2021   GAD (generalized anxiety disorder) 06/22/2019   Insomnia due to medical condition 06/22/2019   Needle phobia 06/22/2019   Basal cell carcinoma (BCC) in situ of skin 03/14/2019   Bilateral carpal tunnel syndrome 08/27/2018   Intractable migraine with aura without status migrainosus 08/27/2018   Polyneuropathy 08/27/2018   Cervical cancer ([place]) 08/18/2018   Basal cell carcinoma, eyelid, left 02/14/2018   Chest pain 09/23/2017   Elevated TSH 07/03/2017   Chronic otitis externa of both ears 10/15/2016   Disequilibrium 09/19/2016   Menorrhagia with irregular cycle 09/17/2016   Chronic idiopathic urticaria 09/03/2016   Postoperative state 08/20/2016   Calcaneal spur 07/10/2016   Plantar fasciitis 07/10/2016   On prednisone therapy 06/20/2016   Allergic rhinitis 04/17/2016   Allergic urticaria due to ingested food 02/09/2016   Heavy drinker 04/11/2014   Depression 09/30/2013   Irregular bleeding 09/30/2013   Migraine headache 09/30/2013   Excess weight 09/30/2013   Frequent UTI 09/30/2013    Infection of urinary tract 09/30/2013   VSD (ventricular septal defect and aortic arch hypoplasia 08/05/2013   Abnormal weight gain 08/05/2013    Kristine [name], PT, DPT 04/02/22, 5:19 PM   [facility] [address] [place], [address] Phone: [phone]   Fax:  [phone]  Name: Kristine [name] MRN: [id number] Date of Birth: 30-May-1971

## 2022-04-04 ENCOUNTER — Encounter: Payer: Self-pay | Admitting: Dermatology

## 2022-04-04 ENCOUNTER — Encounter: Payer: BC Managed Care – PPO | Admitting: Physical Therapy

## 2022-04-05 ENCOUNTER — Ambulatory Visit: Payer: BC Managed Care – PPO

## 2022-04-05 ENCOUNTER — Encounter: Payer: Self-pay | Admitting: Physical Therapy

## 2022-04-05 DIAGNOSIS — M542 Cervicalgia: Secondary | ICD-10-CM

## 2022-04-05 DIAGNOSIS — M6281 Muscle weakness (generalized): Secondary | ICD-10-CM

## 2022-04-05 NOTE — Therapy (Signed)
[facility] [address], [place], [address] Phone: [phone]   Fax:  [phone]  Physical Therapy Treatment  Patient Details  Name: Kristine [name] MRN: [id number] Date of Birth: Feb 07, 1971 No data recorded  Encounter Date: 04/05/2022   PT End of Session - 04/05/22 1300     Visit Number 5    Number of Visits 13    Date for PT Re-Evaluation 04/24/22    Authorization Type 6/10 PN on 07/22/19    Authorization - Visit Number 4    Authorization - Number of Visits 13    Progress Note Due on Visit 10    PT Start Time 1300    PT Stop Time 1345    PT Time Calculation (min) 45 min    Activity Tolerance Patient limited by pain    Behavior During Therapy [facility] for tasks assessed/performed             Past Medical History:  Diagnosis Date   Abnormal weight gain 08/05/2013   Absence of interventricular septum 08/05/2013   Allergic rhinitis 04/17/2016   Allergic urticaria due to ingested food 02/09/2016   Anxiety    Asthma    Cancer ([place])    melanoma-left eye and right hand   Clinical depression 35/32/9924   Complication of anesthesia    PT HAD TROUBLE BREATHING AFTER HYSTERECTOMY IN OCTOBER   Depression    Dizziness    Excess weight 09/30/2013   GERD (gastroesophageal reflux disease)    Headache, migraine 09/30/2013   Heart murmur    Heavy drinker 04/11/2014   Overview:  Patient reports drinking 2-3 bottles of wine each weekend.     History of methicillin resistant staphylococcus aureus (MRSA) 11/2015   Hypothyroidism    Infection of urinary tract 09/30/2013   Irregular bleeding 09/30/2013   Multiple allergies    Pneumonia    Pre-diabetes    VSD (ventricular septal defect and aortic arch hypoplasia     Past Surgical History:  Procedure Laterality Date   ANTERIOR CERVICAL DECOMP/DISCECTOMY FUSION N/A 02/09/2022   Procedure: C5-6 ANTERIOR CERVICAL DISCECTOMY AND FUSION (GLOBUS HEDRON);   Surgeon: Meade Maw, MD;  Location: [facility];  Service: Neurosurgery;  Laterality: N/A;   BLEPHAROPLASTY  2019   BOTOX INJECTION N/A 10/23/2016   Procedure: BOTOX INJECTION;  Surgeon: Royston Cowper, MD;  Location: [facility];  Service: Urology;  Laterality: N/A;   BREAST BIOPSY Left 11/13/2013   benign   CESAREAN SECTION     x2   COLONOSCOPY  2006   COLONOSCOPY WITH PROPOFOL N/A 07/04/2020   Procedure: COLONOSCOPY WITH PROPOFOL;  Surgeon: Lesly Rubenstein, MD;  Location: [facility];  Service: Endoscopy;  Laterality: N/A;   COMBINED REDUCTION MAMMAPLASTY W/ ABDOMINOPLASTY Bilateral 05/2021   CORONARY ANGIOPLASTY  2013   COSMETIC SURGERY  06/09/2021   tummy tuck   CYSTOSCOPY N/A 10/23/2016   Procedure: CYSTOSCOPY;  Surgeon: Royston Cowper, MD;  Location: [facility];  Service: Urology;  Laterality: N/A;   CYSTOSCOPY WITH HYDRODISTENSION AND BIOPSY     ESOPHAGOGASTRODUODENOSCOPY     LAPAROSCOPIC ASSISTED VAGINAL HYSTERECTOMY N/A 08/20/2016   Procedure: LAPAROSCOPIC ASSISTED VAGINAL HYSTERECTOMY;  Surgeon: Boykin Nearing, MD;  Location: [facility];  Service: Gynecology;  Laterality: N/A;   LAPAROSCOPIC BILATERAL SALPINGECTOMY Bilateral 08/20/2016   Procedure: LAPAROSCOPIC BILATERAL SALPINGECTOMY;  Surgeon: Boykin Nearing, MD;  Location: [facility];  Service: Gynecology;  Laterality: Bilateral;   NASAL SINUS  SURGERY     x12   TONSILLECTOMY     TUBAL LIGATION  2003    There were no vitals filed for this visit.   Subjective Assessment - 04/05/22 1301     Subjective Pt reports relief after last session during that day but pain returned the following day. Current pain 6-7/10 NPS.    Pertinent History Pt arrives to PT s/p ACDF surgery on 02/09/22 at level C5-C6 -- referred to PT specifically for dry needling. She states she has a phobia of needles however has had DN in the past. She was okay with DN of upper traps but did not handle DN to cervical region well and did not  return to PT following that visit (went to [facility]). Majority of pain is described in bilateral upper traps, bilateral SCM worst near mastoid process, posterior cervical region, and left jaw. She does report having TMJ. She also endorses frequent migraines; she does receive Botox injections every 3 months by Dr. Brigitte Pulse at [facility]. Her next scheduled injection is in June. She reports N&T in bilateral hands and feet for the last 6-7 years, she reports symptoms remain present following surgery. She continues to experience decreased dexterity in bilateral hands - states her husband helps with buttoning shorts/pants, tieing shoes and other dressing/hygiene activities. Before surgery,  she was dropping a lot of objects due to weak grip strength. Since surgery that has improved a lot. Prior to surgery, pt was visiting a chiropractor and massage therapist regularly to address chronic muscle tightness. She reports having severely limited cervical ROM for years and gaining ROM is not a goal of PT. Pt goal for PT is to be pain free. She reports being allergic to pain meds - causing skin irritation such as rashes and itching. She is refusing further meds related to pain control. Currently, pt is not sleeping well due to pain, unable to get comfortable in side lying and cannot breath due to sinuses when supine. 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 She has been having difficulty at work due to bilateral shoulder weakness and fatigue. PMH includes: VSD (ventricular septal defect), allergic rhinitis, UTI, depression, migraines, polyneuropathy, bilateral carpal tunnel syndrome, ADD, anxiety, basal cell carcinoma, fibroids, GERD.    Limitations Lifting;Other (comment);Reading;Writing;House hold activities    How long can you sit comfortably? unlimited    How long can you stand comfortably? unlimited    How long can you walk comfortably?  not limited by cervical pain    Patient Stated Goals to be pain free and not have to take additional medications for pain control    Currently in Pain? Yes    Pain Score 7     Pain Location Neck    Pain Orientation Right;Left    Pain Onset More than a month ago    Pain Onset More than a month ago             Manual therapy   STM with trigger point release to bilat UT for 3 minutes  Supine STM to cervical region to increase extensibility of the paraspinals: 5 minutes  Supine suboccipital release technique to decrease cervicalgia: 5 minutes   Supine manual traction performed in order to increase joint space in cervical region for pain relief: 3x30 sec  Supine cervical downglides grade 3 for improved cervical rotation and lateral flexion mobility: 2x15 second bouts C2-C7  Supine UT stretch bilat, 2x30  sec bouts to increase tissue extensibility of the cervical region        There.ex:  Supine chin tucks with two finger support on chin to prevent upper cervical flexion: 2x20  Seated isometrics: 2x8 sec holds flexion, extension, lateral flexion R/L. Good understanding of form/technique   Scap retractions at [facility]: 1x12, 15#, 2x12, 25#   Seated SNAGS R/L rotation: 3x10 sec holds R/L, VC's for form/technique with exercise    PT Education - 04/05/22 1259     Education Details form/technique with exercise    Person(s) Educated Patient    Methods Explanation;Demonstration;Verbal cues    Comprehension Verbalized understanding;Returned demonstration                 PT Long Term Goals - 03/13/22 1702       PT LONG TERM GOAL #1   Title Pt will decrease worst neck pain as reported on NPRS by at least 2 points in order to demonstrate clinically significant reduction in back pain.    Baseline 03/13/22: 10/10    Time 6    Period Weeks    Status New    Target Date 04/24/22      PT LONG TERM GOAL #2   Title Pt will demonstrate decrease in NDI by at least 19% in order to  demonstrate clinically significant reduction in disability related to neck injury/pain    Baseline 03/13/22: next session    Time 6    Period Weeks    Status New    Target Date 04/24/22      PT LONG TERM GOAL #3   Title Patient will increase FOTO score to equal to or greater than __ to demonstrate statistically significant improvement in mobility and quality of life.    Baseline 03/13/22: next session    Time 6    Period Weeks    Status New    Target Date 04/24/22                   Plan - 04/05/22 1350     Clinical Impression Statement Continuing POC with heavy focus on manual intervention followed by therapeutic exercise. Remains with significantly limited cervical stiffness in all planes. Pt remains with same pain levels post session. Will continue and benefit from PT to progress cervical mobility and decreased pain levels to improve QoL.    Personal Factors and Comorbidities Comorbidity 3+;Fitness;Past/Current Experience;Profession;Social Background;Time since onset of injury/illness/exacerbation    Comorbidities cervical cancer, bilateral carpal tunnel syndrome, polyneuropathy, depression, migraines, VSD, ADD, anxiety, GERD    Examination-Activity Limitations Bathing;Reach Overhead;Bed Mobility;Dressing;Hygiene/Grooming;Lift    Examination-Participation Restrictions Cleaning;Community Activity;Interpersonal Relationship;Laundry;Shop;Yard Work;Occupation    Stability/Clinical Decision Making Evolving/Moderate complexity    Rehab Potential Fair    PT Frequency 2x / week    PT Duration 6 weeks    PT Treatment/Interventions ADLs/Self Care Home Management;Cryotherapy;Electrical Stimulation;Iontophoresis '4mg'$ /ml Dexamethasone;Moist Heat;Ultrasound;Therapeutic activities;Therapeutic exercise;Manual techniques;Passive range of motion;Dry needling;Energy conservation;Patient/family education;Biofeedback;Functional mobility training;Neuromuscular re-education;Vestibular    PT Next Visit  Plan gentle STM, (dry needling?), light stretching, grip strength interventions, scapular stability, cervical stability    PT Home Exercise Plan N/A    Consulted and Agree with Plan of Care Patient             Patient will benefit from skilled therapeutic intervention in order to improve the following deficits and impairments:  Decreased activity tolerance, Decreased endurance, Decreased strength, Impaired perceived functional ability, Postural dysfunction, Improper body mechanics, Pain, Decreased range of motion, Hypomobility, Impaired sensation, Impaired  flexibility  Visit Diagnosis: Cervicalgia  Muscle weakness (generalized)     Problem List Patient Active Problem List   Diagnosis Date Noted   Cervical radicular pain (R>L) 10/11/2021   Spinal stenosis in cervical region 10/11/2021   Cervical spondylosis 10/11/2021   S/P bilateral breast reduction 06/23/2021   Sinusitis accessory 02/03/2021   GAD (generalized anxiety disorder) 06/22/2019   Insomnia due to medical condition 06/22/2019   Needle phobia 06/22/2019   Basal cell carcinoma (BCC) in situ of skin 03/14/2019   Bilateral carpal tunnel syndrome 08/27/2018   Intractable migraine with aura without status migrainosus 08/27/2018   Polyneuropathy 08/27/2018   Cervical cancer ([place]) 08/18/2018   Basal cell carcinoma, eyelid, left 02/14/2018   Chest pain 09/23/2017   Elevated TSH 07/03/2017   Chronic otitis externa of both ears 10/15/2016   Disequilibrium 09/19/2016   Menorrhagia with irregular cycle 09/17/2016   Chronic idiopathic urticaria 09/03/2016   Postoperative state 08/20/2016   Calcaneal spur 07/10/2016   Plantar fasciitis 07/10/2016   On prednisone therapy 06/20/2016   Allergic rhinitis 04/17/2016   Allergic urticaria due to ingested food 02/09/2016   Heavy drinker 04/11/2014   Depression 09/30/2013   Irregular bleeding 09/30/2013   Migraine headache 09/30/2013   Excess weight 09/30/2013   Frequent UTI  09/30/2013   Infection of urinary tract 09/30/2013   VSD (ventricular septal defect and aortic arch hypoplasia 08/05/2013   Abnormal weight gain 08/05/2013   Salem Caster. Fairly IV, PT, DPT Physical Therapist- [facility]  04/05/2022, 1:56 PM  [facility] [address] [place], [address] Phone: [phone]   Fax:  [phone]  Name: MACK THURMON MRN: [id number] Date of Birth: 09-25-1971

## 2022-04-10 ENCOUNTER — Ambulatory Visit: Payer: BC Managed Care – PPO | Admitting: Physical Therapy

## 2022-04-10 ENCOUNTER — Encounter: Payer: Self-pay | Admitting: Physical Therapy

## 2022-04-10 DIAGNOSIS — M542 Cervicalgia: Secondary | ICD-10-CM | POA: Diagnosis not present

## 2022-04-10 DIAGNOSIS — M6281 Muscle weakness (generalized): Secondary | ICD-10-CM

## 2022-04-10 NOTE — Therapy (Signed)
[facility] [address] [place], [address] Phone: [phone]   Fax:  [phone]  Physical Therapy Treatment  Patient Details  Name: Kristine [name] MRN: [id number] Date of Birth: [birth year] No data recorded  Encounter Date: 04/10/2022   PT End of Session - 04/10/22 1505     Visit Number 6    Number of Visits 13    Date for PT Re-Evaluation 04/24/22    Authorization Type 6/10 PN on 07/22/19    Authorization - Visit Number 6    Authorization - Number of Visits 13    Progress Note Due on Visit 10    PT Start Time 2423    PT Stop Time 1512    PT Time Calculation (min) 39 min    Activity Tolerance Patient limited by pain    Behavior During Therapy [facility] for tasks assessed/performed             Past Medical History:  Diagnosis Date   Abnormal weight gain 08/05/2013   Absence of interventricular septum 08/05/2013   Allergic rhinitis 04/17/2016   Allergic urticaria due to ingested food 02/09/2016   Anxiety    Asthma    Cancer ([place])    melanoma-left eye and right hand   Clinical depression 53/61/4431   Complication of anesthesia    PT HAD TROUBLE BREATHING AFTER HYSTERECTOMY IN OCTOBER   Depression    Dizziness    Excess weight 09/30/2013   GERD (gastroesophageal reflux disease)    Headache, migraine 09/30/2013   Heart murmur    Heavy drinker 04/11/2014   Overview:  Patient reports drinking 2-3 bottles of wine each weekend.     History of methicillin resistant staphylococcus aureus (MRSA) 11/2015   Hypothyroidism    Infection of urinary tract 09/30/2013   Irregular bleeding 09/30/2013   Multiple allergies    Pneumonia    Pre-diabetes    VSD (ventricular septal defect and aortic arch hypoplasia     Past Surgical History:  Procedure Laterality Date   ANTERIOR CERVICAL DECOMP/DISCECTOMY FUSION N/A 02/09/2022   Procedure: C5-6 ANTERIOR CERVICAL DISCECTOMY AND FUSION (GLOBUS HEDRON);   Surgeon: Meade Maw, MD;  Location: [facility];  Service: Neurosurgery;  Laterality: N/A;   BLEPHAROPLASTY  2019   BOTOX INJECTION N/A 10/23/2016   Procedure: BOTOX INJECTION;  Surgeon: Royston Cowper, MD;  Location: [facility];  Service: Urology;  Laterality: N/A;   BREAST BIOPSY Left 11/13/2013   benign   CESAREAN SECTION     x2   COLONOSCOPY  2006   COLONOSCOPY WITH PROPOFOL N/A 07/04/2020   Procedure: COLONOSCOPY WITH PROPOFOL;  Surgeon: Lesly Rubenstein, MD;  Location: [facility];  Service: Endoscopy;  Laterality: N/A;   COMBINED REDUCTION MAMMAPLASTY W/ ABDOMINOPLASTY Bilateral 05/2021   CORONARY ANGIOPLASTY  2013   COSMETIC SURGERY  06/09/2021   tummy tuck   CYSTOSCOPY N/A 10/23/2016   Procedure: CYSTOSCOPY;  Surgeon: Royston Cowper, MD;  Location: [facility];  Service: Urology;  Laterality: N/A;   CYSTOSCOPY WITH HYDRODISTENSION AND BIOPSY     ESOPHAGOGASTRODUODENOSCOPY     LAPAROSCOPIC ASSISTED VAGINAL HYSTERECTOMY N/A 08/20/2016   Procedure: LAPAROSCOPIC ASSISTED VAGINAL HYSTERECTOMY;  Surgeon: Boykin Nearing, MD;  Location: [facility];  Service: Gynecology;  Laterality: N/A;   LAPAROSCOPIC BILATERAL SALPINGECTOMY Bilateral 08/20/2016   Procedure: LAPAROSCOPIC BILATERAL SALPINGECTOMY;  Surgeon: Boykin Nearing, MD;  Location: [facility];  Service: Gynecology;  Laterality: Bilateral;   NASAL SINUS  SURGERY     x12   TONSILLECTOMY     TUBAL LIGATION  2003    There were no vitals filed for this visit.   Subjective Assessment - 04/10/22 1439     Subjective Patient reports she is having several falls but she doesn't tell her medical providers because it is embarassing. PT encourages patietn this nothing to be embarassed about! Reports mutliple falls in past 6 months, all occuring when she is walking and talking at the same time. Friend noticed her head is more forward, thinks this is why she is falling.  Denies LOC or hitting head from falling. this past  weekend fell walking and talking, fell forward and her friend helped her catch herself. Denies any dizziness when this happens. Reports her feet are completely numb and this has not improved since surgery. Reports 6/10 cervical pain today (pointing to UT area bilat).    Pertinent History Pt arrives to PT s/p ACDF surgery on 02/09/22 at level C5-C6 -- referred to PT specifically for dry needling. She states she has a phobia of needles however has had DN in the past. She was okay with DN of upper traps but did not handle DN to cervical region well and did not return to PT following that visit (went to [facility]). Majority of pain is described in bilateral upper traps, bilateral SCM worst near mastoid process, posterior cervical region, and left jaw. She does report having TMJ. She also endorses frequent migraines; she does receive Botox injections every 3 months by Dr. Brigitte Pulse at [facility]. Her next scheduled injection is in June. She reports N&T in bilateral hands and feet for the last 6-7 years, she reports symptoms remain present following surgery. She continues to experience decreased dexterity in bilateral hands - states her husband helps with buttoning shorts/pants, tieing shoes and other dressing/hygiene activities. Before surgery,  she was dropping a lot of objects due to weak grip strength. Since surgery that has improved a lot. Prior to surgery, pt was visiting a chiropractor and massage therapist regularly to address chronic muscle tightness. She reports having severely limited cervical ROM for years and gaining ROM is not a goal of PT. Pt goal for PT is to be pain free. She reports being allergic to pain meds - causing skin irritation such as rashes and itching. She is refusing further meds related to pain control. Currently, pt is not sleeping well due to pain, unable to get comfortable in side lying and cannot breath due to sinuses when supine. 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 She has been having difficulty at work due to bilateral shoulder weakness and fatigue. PMH includes: VSD (ventricular septal defect), allergic rhinitis, UTI, depression, migraines, polyneuropathy, bilateral carpal tunnel syndrome, ADD, anxiety, basal cell carcinoma, fibroids, GERD.    Limitations Lifting;Other (comment);Reading;Writing;House hold activities    How long can you sit comfortably? unlimited    How long can you stand comfortably? unlimited    How long can you walk comfortably? not limited by cervical pain    Patient Stated Goals to be pain free and not have to take additional medications for pain control    Pain Onset More than a month ago    Pain Onset More than a month ago             Manual STM with trigger point release to bilat UT and pec minor Supine STM to  cervical region to increase extensibility of the paraspinals Supine suboccipital release technique to decrease cervicalgia Supine manual traction performed in order to increase joint space in cervical region for pain relief Supine cervical upglides/downglides, 30 sec bouts Supine UT/Levator stretch, 30 sec bouts to increase tissue extensibility of the cervical region     Ther-Ex Nustep seat 6 UE 9 19mns L2 for gentle mobility and strengthening Prone scapular depression + retraction with bilat hand lift 2x 12 Prone chest lift (maintained scapular retraction + depression) 2x 10 with min lift to prevent LBP Seated rows 20# x10; 35# x10 with good carry over of initial cuing for proper technique Posterior shoulder rolls x12                              PT Education - 04/10/22 1503     Education Details therex form/technique    Person(s) Educated Patient    Methods Explanation;Demonstration;Verbal cues    Comprehension Verbalized understanding;Returned demonstration;Verbal cues required              PT Short Term  Goals - 07/15/19 1752       PT SHORT TERM GOAL #1   Title Patient will be independent in home exercise program to improve strength/mobility for better functional independence with ADLs.    Baseline 7/6: HEP given 9/2: HEp compliant    Time 2    Period Weeks    Status Partially Met    Target Date 07/29/19               PT Long Term Goals - 03/13/22 1702       PT LONG TERM GOAL #1   Title Pt will decrease worst neck pain as reported on NPRS by at least 2 points in order to demonstrate clinically significant reduction in back pain.    Baseline 03/13/22: 10/10    Time 6    Period Weeks    Status New    Target Date 04/24/22      PT LONG TERM GOAL #2   Title Pt will demonstrate decrease in NDI by at least 19% in order to demonstrate clinically significant reduction in disability related to neck injury/pain    Baseline 03/13/22: next session    Time 6    Period Weeks    Status New    Target Date 04/24/22      PT LONG TERM GOAL #3   Title Patient will increase FOTO score to equal to or greater than __ to demonstrate statistically significant improvement in mobility and quality of life.    Baseline 03/13/22: next session    Time 6    Period Weeks    Status New    Target Date 04/24/22                   Plan - 04/10/22 1516     Clinical Impression Statement Pt reports that she is needle phobic, but would like PT to push through this; PT educated patient that needle phobia is a contraindiciation of TDN, and that reduction of soft tissue tension can be obtained through manual techniques as well for paitent comfort with understanding. PT continued manual technique for soft tissue tension and pain reduction. Decreased tissue tension allowing for activition of scapular retractors and depressors with success. Patient motivated throughout session with good carry over of technique of therex, with good carry over of reduction of upper crossed posture following. Pt  demonstrating hip  flex throughout walking, contributing to "pitching forward" walking posture, and potential falls. PT advised that PT could focus on balance/hip mobility/strengtening following current PT episode with understanding. PT will continue progression as able.    Personal Factors and Comorbidities Comorbidity 3+;Fitness;Past/Current Experience;Profession;Social Background;Time since onset of injury/illness/exacerbation    Comorbidities cervical cancer, bilateral carpal tunnel syndrome, polyneuropathy, depression, migraines, VSD, ADD, anxiety, GERD    Examination-Activity Limitations Bathing;Reach Overhead;Bed Mobility;Dressing;Hygiene/Grooming;Lift    Examination-Participation Restrictions Cleaning;Community Activity;Interpersonal Relationship;Laundry;Shop;Yard Work;Occupation    Stability/Clinical Decision Making Evolving/Moderate complexity    Clinical Decision Making Moderate    Rehab Potential Fair    PT Frequency 2x / week    PT Duration 6 weeks    PT Treatment/Interventions ADLs/Self Care Home Management;Cryotherapy;Electrical Stimulation;Iontophoresis 29m/ml Dexamethasone;Moist Heat;Ultrasound;Therapeutic activities;Therapeutic exercise;Manual techniques;Passive range of motion;Dry needling;Energy conservation;Patient/family education;Biofeedback;Functional mobility training;Neuromuscular re-education;Vestibular    PT Next Visit Plan gentle STM, (dry needling?), light stretching, grip strength interventions, scapular stability, cervical stability    PT Home Exercise Plan N/A    Consulted and Agree with Plan of Care Patient             Patient will benefit from skilled therapeutic intervention in order to improve the following deficits and impairments:  Decreased activity tolerance, Decreased endurance, Decreased strength, Impaired perceived functional ability, Postural dysfunction, Improper body mechanics, Pain, Decreased range of motion, Hypomobility, Impaired sensation, Impaired  flexibility  Visit Diagnosis: Cervicalgia  Muscle weakness (generalized)     Problem List Patient Active Problem List   Diagnosis Date Noted   Cervical radicular pain (R>L) 10/11/2021   Spinal stenosis in cervical region 10/11/2021   Cervical spondylosis 10/11/2021   S/P bilateral breast reduction 06/23/2021   Sinusitis accessory 02/03/2021   GAD (generalized anxiety disorder) 06/22/2019   Insomnia due to medical condition 06/22/2019   Needle phobia 06/22/2019   Basal cell carcinoma (BCC) in situ of skin 03/14/2019   Bilateral carpal tunnel syndrome 08/27/2018   Intractable migraine with aura without status migrainosus 08/27/2018   Polyneuropathy 08/27/2018   [place] 08/18[place]   Basal cell carcinoma, eyelid, left 02/14/2018   Chest pain 09/23/2017   Elevated TSH 07/03/2017   Chronic otitis externa of both ears 10/15/2016   Disequilibrium 09/19/2016   Menorrhagia with irregular cycle 09/17/2016   Chronic idiopathic urticaria 09/03/2016   Postoperative state 08/20/2016   Calcaneal spur 07/10/2016   Plantar fasciitis 07/10/2016   On prednisone therapy 06/20/2016   Allergic rhinitis 04/17/2016   Allergic urticaria due to ingested food 02/09/2016   Heavy drinker 04/11/2014   Depression 09/30/2013   Irregular bleeding 09/30/2013   Migraine headache 09/30/2013   Excess weight 09/30/2013   Frequent UTI 09/30/2013   Infection of urinary tract 09/30/2013   VSD (ventricular septal defect and aortic arch hypoplasia 08/05/2013   Abnormal weight gain 08/05/2013   CDurwin RegesDPT CDurwin Reges PT 04/10/2022, 3:56 PM   [facility] [address] [place] [address]Phone: [phone]  Fax:  [phone] Name: Kristine UMEDAMRN: [id number] of Birth: [birth year]

## 2022-04-12 ENCOUNTER — Ambulatory Visit: Payer: BC Managed Care – PPO | Attending: Neurosurgery | Admitting: Physical Therapy

## 2022-04-12 ENCOUNTER — Encounter: Payer: Self-pay | Admitting: Physical Therapy

## 2022-04-12 DIAGNOSIS — M6281 Muscle weakness (generalized): Secondary | ICD-10-CM | POA: Diagnosis present

## 2022-04-12 DIAGNOSIS — M542 Cervicalgia: Secondary | ICD-10-CM | POA: Insufficient documentation

## 2022-04-12 NOTE — Therapy (Signed)
[facility] [address], [place], [address] Phone: [phone]   Fax:  [phone]  Physical Therapy Treatment  Patient Details  Name: Kristine [name] MRN: [id number] Date of Birth: 03-06-1971 No data recorded  Encounter Date: 04/12/2022   PT End of Session - 04/12/22 1309     Visit Number 7    Number of Visits 13    Date for PT Re-Evaluation 04/24/22    Authorization Type 6/10 PN on 07/22/19    Authorization - Visit Number 7    Authorization - Number of Visits 13    Progress Note Due on Visit 10    PT Start Time 3361    PT Stop Time 1341    PT Time Calculation (min) 38 min    Activity Tolerance Patient limited by pain    Behavior During Therapy [facility] for tasks assessed/performed             Past Medical History:  Diagnosis Date   Abnormal weight gain 08/05/2013   Absence of interventricular septum 08/05/2013   Allergic rhinitis 04/17/2016   Allergic urticaria due to ingested food 02/09/2016   Anxiety    Asthma    Cancer ([place])    melanoma-left eye and right hand   Clinical depression 22/44/9753   Complication of anesthesia    PT HAD TROUBLE BREATHING AFTER HYSTERECTOMY IN OCTOBER   Depression    Dizziness    Excess weight 09/30/2013   GERD (gastroesophageal reflux disease)    Headache, migraine 09/30/2013   Heart murmur    Heavy drinker 04/11/2014   Overview:  Patient reports drinking 2-3 bottles of wine each weekend.     History of methicillin resistant staphylococcus aureus (MRSA) 11/2015   Hypothyroidism    Infection of urinary tract 09/30/2013   Irregular bleeding 09/30/2013   Multiple allergies    Pneumonia    Pre-diabetes    VSD (ventricular septal defect and aortic arch hypoplasia     Past Surgical History:  Procedure Laterality Date   ANTERIOR CERVICAL DECOMP/DISCECTOMY FUSION N/A 02/09/2022   Procedure: C5-6 ANTERIOR CERVICAL DISCECTOMY AND FUSION (GLOBUS HEDRON);  Surgeon:  Meade Maw, MD;  Location: [facility];  Service: Neurosurgery;  Laterality: N/A;   BLEPHAROPLASTY  2019   BOTOX INJECTION N/A 10/23/2016   Procedure: BOTOX INJECTION;  Surgeon: Royston Cowper, MD;  Location: [facility];  Service: Urology;  Laterality: N/A;   BREAST BIOPSY Left 11/13/2013   benign   CESAREAN SECTION     x2   COLONOSCOPY  2006   COLONOSCOPY WITH PROPOFOL N/A 07/04/2020   Procedure: COLONOSCOPY WITH PROPOFOL;  Surgeon: Lesly Rubenstein, MD;  Location: [facility];  Service: Endoscopy;  Laterality: N/A;   COMBINED REDUCTION MAMMAPLASTY W/ ABDOMINOPLASTY Bilateral 05/2021   CORONARY ANGIOPLASTY  2013   COSMETIC SURGERY  06/09/2021   tummy tuck   CYSTOSCOPY N/A 10/23/2016   Procedure: CYSTOSCOPY;  Surgeon: Royston Cowper, MD;  Location: [facility];  Service: Urology;  Laterality: N/A;   CYSTOSCOPY WITH HYDRODISTENSION AND BIOPSY     ESOPHAGOGASTRODUODENOSCOPY     LAPAROSCOPIC ASSISTED VAGINAL HYSTERECTOMY N/A 08/20/2016   Procedure: LAPAROSCOPIC ASSISTED VAGINAL HYSTERECTOMY;  Surgeon: Boykin Nearing, MD;  Location: [facility];  Service: Gynecology;  Laterality: N/A;   LAPAROSCOPIC BILATERAL SALPINGECTOMY Bilateral 08/20/2016   Procedure: LAPAROSCOPIC BILATERAL SALPINGECTOMY;  Surgeon: Boykin Nearing, MD;  Location: [facility];  Service: Gynecology;  Laterality: Bilateral;   NASAL SINUS  SURGERY     x12   TONSILLECTOMY     TUBAL LIGATION  2003    There were no vitals filed for this visit.   Subjective Assessment - 04/12/22 1306     Subjective Patient reports stressed induced headache this am. Pt reports starting a new headache medication Sunday, that she does not think has started working yet. Reports bilat UT pain is 8/10. "Yesterday my back was sore between my shoulder blades, like I could tell I actually did something". Still having glove and sock hand and feet numbness/tingling.    Pertinent History Pt arrives to PT s/p ACDF surgery on 02/09/22 at  level C5-C6 -- referred to PT specifically for dry needling. She states she has a phobia of needles however has had DN in the past. She was okay with DN of upper traps but did not handle DN to cervical region well and did not return to PT following that visit (went to [facility]). Majority of pain is described in bilateral upper traps, bilateral SCM worst near mastoid process, posterior cervical region, and left jaw. She does report having TMJ. She also endorses frequent migraines; she does receive Botox injections every 3 months by Dr. Brigitte Pulse at [facility]. Her next scheduled injection is in June. She reports N&T in bilateral hands and feet for the last 6-7 years, she reports symptoms remain present following surgery. She continues to experience decreased dexterity in bilateral hands - states her husband helps with buttoning shorts/pants, tieing shoes and other dressing/hygiene activities. Before surgery,  she was dropping a lot of objects due to weak grip strength. Since surgery that has improved a lot. Prior to surgery, pt was visiting a chiropractor and massage therapist regularly to address chronic muscle tightness. She reports having severely limited cervical ROM for years and gaining ROM is not a goal of PT. Pt goal for PT is to be pain free. She reports being allergic to pain meds - causing skin irritation such as rashes and itching. She is refusing further meds related to pain control. Currently, pt is not sleeping well due to pain, unable to get comfortable in side lying and cannot breath due to sinuses when supine. 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 She has been having difficulty at work due to bilateral shoulder weakness and fatigue. PMH includes: VSD (ventricular septal defect), allergic rhinitis, UTI, depression, migraines, polyneuropathy, bilateral carpal tunnel syndrome, ADD, anxiety, basal cell carcinoma,  fibroids, GERD.    Limitations Lifting;Other (comment);Reading;Writing;House hold activities    How long can you sit comfortably? unlimited    How long can you stand comfortably? unlimited    How long can you walk comfortably? not limited by cervical pain    Patient Stated Goals to be pain free and not have to take additional medications for pain control    Pain Onset More than a month ago    Pain Onset More than a month ago                Manual STM with trigger point release to bilat UT and pec minor Supine STM to cervical region to increase extensibility of the paraspinals Supine suboccipital release technique to decrease cervicalgia Supine manual traction performed in order to increase joint space in cervical region for pain relief Supine cervical upglides/downglides, 30 sec bouts Supine UT/Levator stretch, 30 sec bouts to increase tissue extensibility of the cervical region  Ther-Ex Nustep seat 6 UE 9 63mns L2 for gentle mobility and strengthening Prone chest lift (maintained scapular retraction + depression) 2x 10/12 with min lift to prevent LBP Prone Y with very minimal lift 2x 12 with lift painful to LBP Y on wall x12 with excellent carry over of scapulohumeral rhythm without lumbar ext Standing bent over rows 10# DB each had 2x 10 with excellent carry over of demo with min cuing for set up position Posterior shoulder rolls x12                                    PT Education - 04/12/22 1309     Education Details therex form/technique    Person(s) Educated Patient    Methods Explanation;Demonstration;Verbal cues    Comprehension Returned demonstration;Verbalized understanding;Verbal cues required              PT Short Term Goals - 07/15/19 1752       PT SHORT TERM GOAL #1   Title Patient will be independent in home exercise program to improve strength/mobility for better functional independence with ADLs.    Baseline 7/6: HEP  given 9/2: HEp compliant    Time 2    Period Weeks    Status Partially Met    Target Date 07/29/19               PT Long Term Goals - 03/13/22 1702       PT LONG TERM GOAL #1   Title Pt will decrease worst neck pain as reported on NPRS by at least 2 points in order to demonstrate clinically significant reduction in back pain.    Baseline 03/13/22: 10/10    Time 6    Period Weeks    Status New    Target Date 04/24/22      PT LONG TERM GOAL #2   Title Pt will demonstrate decrease in NDI by at least 19% in order to demonstrate clinically significant reduction in disability related to neck injury/pain    Baseline 03/13/22: next session    Time 6    Period Weeks    Status New    Target Date 04/24/22      PT LONG TERM GOAL #3   Title Patient will increase FOTO score to equal to or greater than __ to demonstrate statistically significant improvement in mobility and quality of life.    Baseline 03/13/22: next session    Time 6    Period Weeks    Status New    Target Date 04/24/22                   Plan - 04/12/22 1404     Clinical Impression Statement PT continued therex progression for increased periscapular strengthening and postural awareness with success. Patient is able to comply with all cuing for proper technique of therex, and with multimodal cuing able to activiate periscapular musclulature without low back compensation/excessive ext. PT educated patietn on pain hypersensitivity (evident by inability to define laterality of pain, with diffuse pain response) and how to work on reducing pain cycle with good understanding. Pt with much better postural self awareness (reduced shoulder hiking) through therex with excellent motivation. PT will continue progression as able.    Personal Factors and Comorbidities Comorbidity 3+;Fitness;Past/Current Experience;Profession;Social Background;Time since onset of injury/illness/exacerbation    Comorbidities cervical cancer,  bilateral carpal tunnel syndrome, polyneuropathy, depression, migraines, VSD,  ADD, anxiety, GERD    Examination-Activity Limitations Bathing;Reach Overhead;Bed Mobility;Dressing;Hygiene/Grooming;Lift    Examination-Participation Restrictions Cleaning;Community Activity;Interpersonal Relationship;Laundry;Shop;Yard Work;Occupation    Stability/Clinical Decision Making Evolving/Moderate complexity    Clinical Decision Making Moderate    Rehab Potential Fair    PT Frequency 2x / week    PT Duration 6 weeks    PT Treatment/Interventions ADLs/Self Care Home Management;Cryotherapy;Electrical Stimulation;Iontophoresis 17m/ml Dexamethasone;Moist Heat;Ultrasound;Therapeutic activities;Therapeutic exercise;Manual techniques;Passive range of motion;Dry needling;Energy conservation;Patient/family education;Biofeedback;Functional mobility training;Neuromuscular re-education;Vestibular    PT Next Visit Plan gentle STM, (dry needling?), light stretching, grip strength interventions, scapular stability, cervical stability    PT Home Exercise Plan N/A    Consulted and Agree with Plan of Care Patient             Patient will benefit from skilled therapeutic intervention in order to improve the following deficits and impairments:  Decreased activity tolerance, Decreased endurance, Decreased strength, Impaired perceived functional ability, Postural dysfunction, Improper body mechanics, Pain, Decreased range of motion, Hypomobility, Impaired sensation, Impaired flexibility  Visit Diagnosis: Cervicalgia  Muscle weakness (generalized)     Problem List Patient Active Problem List   Diagnosis Date Noted   Cervical radicular pain (R>L) 10/11/2021   Spinal stenosis in cervical region 10/11/2021   Cervical spondylosis 10/11/2021   S/P bilateral breast reduction 06/23/2021   Sinusitis accessory 02/03/2021   GAD (generalized anxiety disorder) 06/22/2019   Insomnia due to medical condition 06/22/2019   Needle  phobia 06/22/2019   Basal cell carcinoma (BCC) in situ of skin 03/14/2019   Bilateral carpal tunnel syndrome 08/27/2018   Intractable migraine with aura without status migrainosus 08/27/2018   Polyneuropathy 08/27/2018   [place]ervical cancer ([place] 08/18/2018   Basal cell carcinoma, eyelid, left 02/14/2018   Chest pain 09/23/2017   Elevated TSH 07/03/2017   Chronic otitis externa of both ears 10/15/2016   Disequilibrium 09/19/2016   Menorrhagia with irregular cycle 09/17/2016   Chronic idiopathic urticaria 09/03/2016   Postoperative state 08/20/2016   Calcaneal spur 07/10/2016   Plantar fasciitis 07/10/2016   On prednisone therapy 06/20/2016   Allergic rhinitis 04/17/2016   Allergic urticaria due to ingested food 02/09/2016   Heavy drinker 04/11/2014   Depression 09/30/2013   Irregular bleeding 09/30/2013   Migraine headache 09/30/2013   Excess weight 09/30/2013   Frequent UTI 09/30/2013   Infection of urinary tract 09/30/2013   VSD (ventricular septal defect and aortic arch hypoplasia 08/05/2013   Abnormal weight gain 08/05/2013   CDurwin RegesDPT CDurwin Reges PT 04/12/2022, 2:19 PM  [facility] [address] [place] [address]Phone: [phone]  Fax:  [phone] Name: Kristine LUOMRN: [id number] of Birth: 6May 14, 1972

## 2022-04-16 ENCOUNTER — Ambulatory Visit: Payer: BC Managed Care – PPO | Admitting: Physical Therapy

## 2022-04-16 ENCOUNTER — Encounter: Payer: Self-pay | Admitting: Physical Therapy

## 2022-04-16 ENCOUNTER — Encounter: Payer: BC Managed Care – PPO | Admitting: Physical Therapy

## 2022-04-16 DIAGNOSIS — M542 Cervicalgia: Secondary | ICD-10-CM

## 2022-04-16 DIAGNOSIS — M6281 Muscle weakness (generalized): Secondary | ICD-10-CM

## 2022-04-16 NOTE — Therapy (Signed)
[facility] [address] [place], [address] Phone: [phone]   Fax:  [phone]  Physical Therapy Treatment  Patient Details  Name: Kristine [name] MRN: [id number] Date of Birth: 1971-09-27 No data recorded  Encounter Date: 04/16/2022   PT End of Session - 04/16/22 1052     Visit Number 8    Number of Visits 13    Date for PT Re-Evaluation 04/24/22    Authorization - Visit Number 8    Authorization - Number of Visits 13    Progress Note Due on Visit 10    PT Start Time 1046    PT Stop Time 1125    PT Time Calculation (min) 39 min    Activity Tolerance Patient limited by pain    Behavior During Therapy [facility] for tasks assessed/performed             Past Medical History:  Diagnosis Date   Abnormal weight gain 08/05/2013   Absence of interventricular septum 08/05/2013   Allergic rhinitis 04/17/2016   Allergic urticaria due to ingested food 02/09/2016   Anxiety    Asthma    Cancer ([place])    melanoma-left eye and right hand   Clinical depression 85/27/7824   Complication of anesthesia    PT HAD TROUBLE BREATHING AFTER HYSTERECTOMY IN OCTOBER   Depression    Dizziness    Excess weight 09/30/2013   GERD (gastroesophageal reflux disease)    Headache, migraine 09/30/2013   Heart murmur    Heavy drinker 04/11/2014   Overview:  Patient reports drinking 2-3 bottles of wine each weekend.     History of methicillin resistant staphylococcus aureus (MRSA) 11/2015   Hypothyroidism    Infection of urinary tract 09/30/2013   Irregular bleeding 09/30/2013   Multiple allergies    Pneumonia    Pre-diabetes    VSD (ventricular septal defect and aortic arch hypoplasia     Past Surgical History:  Procedure Laterality Date   ANTERIOR CERVICAL DECOMP/DISCECTOMY FUSION N/A 02/09/2022   Procedure: C5-6 ANTERIOR CERVICAL DISCECTOMY AND FUSION (GLOBUS HEDRON);  Surgeon: Meade Maw, MD;  Location: [facility]  ORS;  Service: Neurosurgery;  Laterality: N/A;   BLEPHAROPLASTY  2019   BOTOX INJECTION N/A 10/23/2016   Procedure: BOTOX INJECTION;  Surgeon: Royston Cowper, MD;  Location: [facility] ORS;  Service: Urology;  Laterality: N/A;   BREAST BIOPSY Left 11/13/2013   benign   CESAREAN SECTION     x2   COLONOSCOPY  2006   COLONOSCOPY WITH PROPOFOL N/A 07/04/2020   Procedure: COLONOSCOPY WITH PROPOFOL;  Surgeon: Lesly Rubenstein, MD;  Location: [facility] ENDOSCOPY;  Service: Endoscopy;  Laterality: N/A;   COMBINED REDUCTION MAMMAPLASTY W/ ABDOMINOPLASTY Bilateral 05/2021   CORONARY ANGIOPLASTY  2013   COSMETIC SURGERY  06/09/2021   tummy tuck   CYSTOSCOPY N/A 10/23/2016   Procedure: CYSTOSCOPY;  Surgeon: Royston Cowper, MD;  Location: [facility] ORS;  Service: Urology;  Laterality: N/A;   CYSTOSCOPY WITH HYDRODISTENSION AND BIOPSY     ESOPHAGOGASTRODUODENOSCOPY     LAPAROSCOPIC ASSISTED VAGINAL HYSTERECTOMY N/A 08/20/2016   Procedure: LAPAROSCOPIC ASSISTED VAGINAL HYSTERECTOMY;  Surgeon: Boykin Nearing, MD;  Location: [facility] ORS;  Service: Gynecology;  Laterality: N/A;   LAPAROSCOPIC BILATERAL SALPINGECTOMY Bilateral 08/20/2016   Procedure: LAPAROSCOPIC BILATERAL SALPINGECTOMY;  Surgeon: Boykin Nearing, MD;  Location: [facility] ORS;  Service: Gynecology;  Laterality: Bilateral;   NASAL SINUS SURGERY     x12   TONSILLECTOMY  TUBAL LIGATION  2003    There were no vitals filed for this visit.   Subjective Assessment - 04/16/22 1048     Subjective Pt reports some increased pain today, played cornhole this weekend. Reports her UT pain is 5/10 today, on both sides, no side worse than the other.    Pertinent History Pt arrives to PT s/p ACDF surgery on 02/09/22 at level C5-C6 -- referred to PT specifically for dry needling. She states she has a phobia of needles however has had DN in the past. She was okay with DN of upper traps but did not handle DN to cervical region well and did not return to PT  following that visit (went to [facility]). Majority of pain is described in bilateral upper traps, bilateral SCM worst near mastoid process, posterior cervical region, and left jaw. She does report having TMJ. She also endorses frequent migraines; she does receive Botox injections every 3 months by Dr. Brigitte Pulse at [facility]. Her next scheduled injection is in June. She reports N&T in bilateral hands and feet for the last 6-7 years, she reports symptoms remain present following surgery. She continues to experience decreased dexterity in bilateral hands - states her husband helps with buttoning shorts/pants, tieing shoes and other dressing/hygiene activities. Before surgery,  she was dropping a lot of objects due to weak grip strength. Since surgery that has improved a lot. Prior to surgery, pt was visiting a chiropractor and massage therapist regularly to address chronic muscle tightness. She reports having severely limited cervical ROM for years and gaining ROM is not a goal of PT. Pt goal for PT is to be pain free. She reports being allergic to pain meds - causing skin irritation such as rashes and itching. She is refusing further meds related to pain control. Currently, pt is not sleeping well due to pain, unable to get comfortable in side lying and cannot breath due to sinuses when supine. 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 She has been having difficulty at work due to bilateral shoulder weakness and fatigue. PMH includes: VSD (ventricular septal defect), allergic rhinitis, UTI, depression, migraines, polyneuropathy, bilateral carpal tunnel syndrome, ADD, anxiety, basal cell carcinoma, fibroids, GERD.    Limitations Lifting;Other (comment);Reading;Writing;House hold activities    How long can you sit comfortably? unlimited    How long can you stand comfortably? unlimited    How long can you walk comfortably? not limited by  cervical pain    Patient Stated Goals to be pain free and not have to take additional medications for pain control    Currently in Pain? Other (Comment)    Pain Onset More than a month ago    Pain Onset More than a month ago              Manual STM with trigger point release to bilat UT and pec minor Supine STM to cervical region to increase extensibility of the paraspinals Supine suboccipital release technique to decrease cervicalgia Supine manual traction performed in order to increase joint space in cervical region for pain relief Supine cervical upglides/downglides, 30 sec bouts Supine UT/Levator stretch, 30 sec bouts to increase tissue extensibility of the cervical region     Ther-Ex Nustep seat 6 UE 9 72mns L2 for gentle mobility and strengthening Prone chest lift with hands behind head 2x 10 Prone Y 3x 10/9/8 with good carry over of technique following min cuing Standing  alt bent over rows 10# DB each had 2x 12 (6 each side) with excellent carry over of demo with min cuing for set up position Deadlift with 10# DB in each hand 2x 8 with max cuing initially for scapular retraction without excessive elevation with excellent carry over Posterior shoulder rolls x12                          PT Education - 04/16/22 1051     Education Details therex form/technique    Person(s) Educated Patient    Methods Explanation;Demonstration;Verbal cues    Comprehension Verbalized understanding;Returned demonstration;Verbal cues required                 PT Long Term Goals - 03/13/22 1702       PT LONG TERM GOAL #1   Title Pt will decrease worst neck pain as reported on NPRS by at least 2 points in order to demonstrate clinically significant reduction in back pain.    Baseline 03/13/22: 10/10    Time 6    Period Weeks    Status New    Target Date 04/24/22      PT LONG TERM GOAL #2   Title Pt will demonstrate decrease in NDI by at least 19% in order to  demonstrate clinically significant reduction in disability related to neck injury/pain    Baseline 03/13/22: next session    Time 6    Period Weeks    Status New    Target Date 04/24/22      PT LONG TERM GOAL #3   Title Patient will increase FOTO score to equal to or greater than __ to demonstrate statistically significant improvement in mobility and quality of life.    Baseline 03/13/22: next session    Time 6    Period Weeks    Status New    Target Date 04/24/22                   Plan - 04/16/22 1121     Clinical Impression Statement PT continued therex progression for increased periscapular strengthening and restoration of efficient length-tension relationship with success. Patient is very motivated throughout session and able to comply with all cuing for proper technique of therex with continued visable increase in mobility, postural awareness, and self correction. PT will continue progression as able.    Personal Factors and Comorbidities Comorbidity 3+;Fitness;Past/Current Experience;Profession;Social Background;Time since onset of injury/illness/exacerbation    Comorbidities cervical cancer, bilateral carpal tunnel syndrome, polyneuropathy, depression, migraines, VSD, ADD, anxiety, GERD    Examination-Activity Limitations Bathing;Reach Overhead;Bed Mobility;Dressing;Hygiene/Grooming;Lift    Examination-Participation Restrictions Cleaning;Community Activity;Interpersonal Relationship;Laundry;Shop;Yard Work;Occupation    Stability/Clinical Decision Making Evolving/Moderate complexity    Clinical Decision Making Moderate    Rehab Potential Fair    PT Frequency 2x / week    PT Duration 6 weeks    PT Treatment/Interventions ADLs/Self Care Home Management;Cryotherapy;Electrical Stimulation;Iontophoresis '4mg'$ /ml Dexamethasone;Moist Heat;Ultrasound;Therapeutic activities;Therapeutic exercise;Manual techniques;Passive range of motion;Dry needling;Energy conservation;Patient/family  education;Biofeedback;Functional mobility training;Neuromuscular re-education;Vestibular    PT Next Visit Plan gentle STM, (dry needling?), light stretching, grip strength interventions, scapular stability, cervical stability    PT Home Exercise Plan N/A    Consulted and Agree with Plan of Care Patient             Patient will benefit from skilled therapeutic intervention in order to improve the following deficits and impairments:  Decreased activity tolerance, Decreased endurance, Decreased strength, Impaired perceived functional ability,  Postural dysfunction, Improper body mechanics, Pain, Decreased range of motion, Hypomobility, Impaired sensation, Impaired flexibility  Visit Diagnosis: Cervicalgia  Muscle weakness (generalized)     Problem List Patient Active Problem List   Diagnosis Date Noted   Cervical radicular pain (R>L) 10/11/2021   Spinal stenosis in cervical region 10/11/2021   Cervical spondylosis 10/11/2021   S/P bilateral breast reduction 06/23/2021   Sinusitis accessory 02/03/2021   GAD (generalized anxiety disorder) 06/22/2019   Insomnia due to medical condition 06/22/2019   Needle phobia 06/22/2019   Basal cell carcinoma (BCC) in situ of skin 03/14/2019   Bilateral carpal tunnel syndrome 08/27/2018   Intractable migraine with aura without status migrainosus 08/27/2018   Polyneuropathy 08/27/2018   Cervical cancer ([place]) 08/18/2018   Basal cell carcinoma, eyelid, left 02/14/2018   Chest pain 09/23/2017   Elevated TSH 07/03/2017   Chronic otitis externa of both ears 10/15/2016   Disequilibrium 09/19/2016   Menorrhagia with irregular cycle 09/17/2016   Chronic idiopathic urticaria 09/03/2016   Postoperative state 08/20/2016   Calcaneal spur 07/10/2016   Plantar fasciitis 07/10/2016   On prednisone therapy 06/20/2016   Allergic rhinitis 04/17/2016   Allergic urticaria due to ingested food 02/09/2016   Heavy drinker 04/11/2014   Depression 09/30/2013    Irregular bleeding 09/30/2013   Migraine headache 09/30/2013   Excess weight 09/30/2013   Frequent UTI 09/30/2013   Infection of urinary tract 09/30/2013   VSD (ventricular septal defect and aortic arch hypoplasia 08/05/2013   Abnormal weight gain 08/05/2013    Durwin Reges, PT 04/16/2022, 11:29 AM  [facility] [address], [place], [address] Phone: [phone]   Fax:  [phone]  Name: SHAVONNA CORELLA MRN: [id number] Date of Birth: March 06, 1971

## 2022-04-18 ENCOUNTER — Ambulatory Visit: Payer: BC Managed Care – PPO | Admitting: Physical Therapy

## 2022-04-24 ENCOUNTER — Ambulatory Visit: Payer: BC Managed Care – PPO | Admitting: Physical Therapy

## 2022-04-26 ENCOUNTER — Ambulatory Visit: Payer: BC Managed Care – PPO | Admitting: Physical Therapy

## 2022-04-27 ENCOUNTER — Other Ambulatory Visit: Payer: Self-pay | Admitting: Internal Medicine

## 2022-04-27 DIAGNOSIS — Z1231 Encounter for screening mammogram for malignant neoplasm of breast: Secondary | ICD-10-CM

## 2022-05-01 ENCOUNTER — Ambulatory Visit: Payer: BC Managed Care – PPO | Admitting: Physical Therapy

## 2022-05-03 ENCOUNTER — Encounter: Payer: BC Managed Care – PPO | Admitting: Physical Therapy

## 2022-05-09 ENCOUNTER — Encounter: Payer: Self-pay | Admitting: Physical Therapy

## 2022-05-09 ENCOUNTER — Ambulatory Visit: Payer: BC Managed Care – PPO | Admitting: Physical Therapy

## 2022-05-09 DIAGNOSIS — M542 Cervicalgia: Secondary | ICD-10-CM

## 2022-05-09 DIAGNOSIS — M6281 Muscle weakness (generalized): Secondary | ICD-10-CM

## 2022-05-09 NOTE — Therapy (Signed)
[facility] [address], [place], [address] Phone: [phone]   Fax:  [phone]  Physical Therapy Treatment  Patient Details  Name: Kristine [name] MRN: [id number] Date of Birth: 03-31-1971 No data recorded  Encounter Date: 05/09/2022   PT End of Session - 05/09/22 0904     Visit Number 9    Number of Visits 13    Date for PT Re-Evaluation 06/15/22    Authorization - Visit Number 9    Authorization - Number of Visits 13    Progress Note Due on Visit 10    PT Start Time 8768    PT Stop Time 0914    PT Time Calculation (min) 39 min    Activity Tolerance Patient limited by pain;Other (comment)   limited by vertigo symptoms   Behavior During Therapy [facility] for tasks assessed/performed             Past Medical History:  Diagnosis Date   Abnormal weight gain 08/05/2013   Absence of interventricular septum 08/05/2013   Allergic rhinitis 04/17/2016   Allergic urticaria due to ingested food 02/09/2016   Anxiety    Asthma    Cancer ([place])    melanoma-left eye and right hand   Clinical depression 11/57/2620   Complication of anesthesia    PT HAD TROUBLE BREATHING AFTER HYSTERECTOMY IN OCTOBER   Depression    Dizziness    Excess weight 09/30/2013   GERD (gastroesophageal reflux disease)    Headache, migraine 09/30/2013   Heart murmur    Heavy drinker 04/11/2014   Overview:  Patient reports drinking 2-3 bottles of wine each weekend.     History of methicillin resistant staphylococcus aureus (MRSA) 11/2015   Hypothyroidism    Infection of urinary tract 09/30/2013   Irregular bleeding 09/30/2013   Multiple allergies    Pneumonia    Pre-diabetes    VSD (ventricular septal defect and aortic arch hypoplasia     Past Surgical History:  Procedure Laterality Date   ANTERIOR CERVICAL DECOMP/DISCECTOMY FUSION N/A 02/09/2022   Procedure: C5-6 ANTERIOR CERVICAL DISCECTOMY AND FUSION (GLOBUS HEDRON);   Surgeon: Meade Maw, MD;  Location: [facility];  Service: Neurosurgery;  Laterality: N/A;   BLEPHAROPLASTY  2019   BOTOX INJECTION N/A 10/23/2016   Procedure: BOTOX INJECTION;  Surgeon: Royston Cowper, MD;  Location: [facility];  Service: Urology;  Laterality: N/A;   BREAST BIOPSY Left 11/13/2013   benign   CESAREAN SECTION     x2   COLONOSCOPY  2006   COLONOSCOPY WITH PROPOFOL N/A 07/04/2020   Procedure: COLONOSCOPY WITH PROPOFOL;  Surgeon: Lesly Rubenstein, MD;  Location: [facility];  Service: Endoscopy;  Laterality: N/A;   COMBINED REDUCTION MAMMAPLASTY W/ ABDOMINOPLASTY Bilateral 05/2021   CORONARY ANGIOPLASTY  2013   COSMETIC SURGERY  06/09/2021   tummy tuck   CYSTOSCOPY N/A 10/23/2016   Procedure: CYSTOSCOPY;  Surgeon: Royston Cowper, MD;  Location: [facility];  Service: Urology;  Laterality: N/A;   CYSTOSCOPY WITH HYDRODISTENSION AND BIOPSY     ESOPHAGOGASTRODUODENOSCOPY     LAPAROSCOPIC ASSISTED VAGINAL HYSTERECTOMY N/A 08/20/2016   Procedure: LAPAROSCOPIC ASSISTED VAGINAL HYSTERECTOMY;  Surgeon: Boykin Nearing, MD;  Location: [facility];  Service: Gynecology;  Laterality: N/A;   LAPAROSCOPIC BILATERAL SALPINGECTOMY Bilateral 08/20/2016   Procedure: LAPAROSCOPIC BILATERAL SALPINGECTOMY;  Surgeon: Boykin Nearing, MD;  Location: [facility];  Service: Gynecology;  Laterality: Bilateral;   NASAL SINUS SURGERY  x12   TONSILLECTOMY     TUBAL LIGATION  2003    There were no vitals filed for this visit.   Subjective Assessment - 05/09/22 0841     Subjective Patient reports she has been struggling with a migraine since being at the beach  for the past week, she has aura (spots and ear ringing) with this as well. She reports it is getting better, but half way through the day it comes back. She woke up with vertigo today. She reports neck pain due to having increased tension from this, pointing to both traps, reports this pain is "tension pain" and it is 5/10.     Pertinent History Pt arrives to PT s/p ACDF surgery on 02/09/22 at level C5-C6 -- referred to PT specifically for dry needling. She states she has a phobia of needles however has had DN in the past. She was okay with DN of upper traps but did not handle DN to cervical region well and did not return to PT following that visit (went to [facility]). Majority of pain is described in bilateral upper traps, bilateral SCM worst near mastoid process, posterior cervical region, and left jaw. She does report having TMJ. She also endorses frequent migraines; she does receive Botox injections every 3 months by Dr. Brigitte Pulse at [facility]. Her next scheduled injection is in June. She reports N&T in bilateral hands and feet for the last 6-7 years, she reports symptoms remain present following surgery. She continues to experience decreased dexterity in bilateral hands - states her husband helps with buttoning shorts/pants, tieing shoes and other dressing/hygiene activities. Before surgery,  she was dropping a lot of objects due to weak grip strength. Since surgery that has improved a lot. Prior to surgery, pt was visiting a chiropractor and massage therapist regularly to address chronic muscle tightness. She reports having severely limited cervical ROM for years and gaining ROM is not a goal of PT. Pt goal for PT is to be pain free. She reports being allergic to pain meds - causing skin irritation such as rashes and itching. She is refusing further meds related to pain control. Currently, pt is not sleeping well due to pain, unable to get comfortable in side lying and cannot breath due to sinuses when supine. 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 She has been having difficulty at work due to bilateral shoulder weakness and fatigue. PMH includes: VSD (ventricular septal defect), allergic rhinitis, UTI, depression, migraines, polyneuropathy,  bilateral carpal tunnel syndrome, ADD, anxiety, basal cell carcinoma, fibroids, GERD.    Limitations Lifting;Other (comment);Reading;Writing;House hold activities    How long can you sit comfortably? unlimited    How long can you stand comfortably? unlimited    How long can you walk comfortably? not limited by cervical pain    Patient Stated Goals to be pain free and not have to take additional medications for pain control    Pain Onset More than a month ago    Pain Onset More than a month ago               Manual STM with trigger point release to bilat UT and pec minor Supine STM to cervical region to increase extensibility of the paraspinals Supine suboccipital release technique to decrease cervicalgia Supine manual traction performed in order to increase joint space in cervical region for pain relief Supine cervical upglides/downglides, 30 sec bouts Supine UT/Levator stretch,  30 sec bouts to increase tissue extensibility of the cervical region     Ther-Ex Nustep seat 6 UE 9 84mns L2 for gentle mobility and strengthening Prone chest lift with hands behind head 2x 10 Seated AROM rotation R<>L with upper cervical flex x20 with increased motion following R rotation: 47d L rotation: 43d Posterior shoulder rolls x20 with focus on scap retraction + depression  UT stretch 30sec bilat Levator stretch 30sec bilat                         PT Education - 05/09/22 0850     Education Details therex form/technique    Person(s) Educated Patient    Methods Verbal cues;Explanation;Demonstration    Comprehension Returned demonstration;Verbalized understanding;Verbal cues required                 PT Long Term Goals - 03/13/22 1702       PT LONG TERM GOAL #1   Title Pt will decrease worst neck pain as reported on NPRS by at least 2 points in order to demonstrate clinically significant reduction in back pain.    Baseline 03/13/22: 10/10    Time 6    Period  Weeks    Status New    Target Date 04/24/22      PT LONG TERM GOAL #2   Title Pt will demonstrate decrease in NDI by at least 19% in order to demonstrate clinically significant reduction in disability related to neck injury/pain    Baseline 03/13/22: next session    Time 6    Period Weeks    Status New    Target Date 04/24/22      PT LONG TERM GOAL #3   Title Patient will increase FOTO score to equal to or greater than __ to demonstrate statistically significant improvement in mobility and quality of life.    Baseline 03/13/22: next session    Time 6    Period Weeks    Status New    Target Date 04/24/22                   Plan - 05/09/22 0908     Clinical Impression Statement PT progression inhibited by vertigo symptoms (thinks this is due to a sinus infection). PT encouraged pt to follow up with PCP about getting her sinus issues under control to reduce vertigo symptoms to relieve tension from stress/vertigo symptoms with understanding. PT utilized manual techniques to relieve muscle tension, and therex for maintenance of this. PT encouraged patient in HEP therex to maintain decreased tension with understanding. PT will reassess patient progress of orthopedic goals next session, with possible vestibular PT referral placement should this problem persist.    Personal Factors and Comorbidities Comorbidity 3+;Fitness;Past/Current Experience;Profession;Social Background;Time since onset of injury/illness/exacerbation    Comorbidities cervical cancer, bilateral carpal tunnel syndrome, polyneuropathy, depression, migraines, VSD, ADD, anxiety, GERD    Examination-Activity Limitations Bathing;Reach Overhead;Bed Mobility;Dressing;Hygiene/Grooming;Lift    Examination-Participation Restrictions Cleaning;Community Activity;Interpersonal Relationship;Laundry;Shop;Yard Work;Occupation    Stability/Clinical Decision Making Evolving/Moderate complexity    Clinical Decision Making Moderate    Rehab  Potential Fair    PT Frequency 2x / week    PT Duration 6 weeks    PT Treatment/Interventions ADLs/Self Care Home Management;Cryotherapy;Electrical Stimulation;Iontophoresis '4mg'$ /ml Dexamethasone;Moist Heat;Ultrasound;Therapeutic activities;Therapeutic exercise;Manual techniques;Passive range of motion;Dry needling;Energy conservation;Patient/family education;Biofeedback;Functional mobility training;Neuromuscular re-education;Vestibular    PT Next Visit Plan gentle STM, (dry needling?), light stretching, grip strength interventions, scapular stability, cervical stability  PT Home Exercise Plan N/A    Consulted and Agree with Plan of Care Patient             Patient will benefit from skilled therapeutic intervention in order to improve the following deficits and impairments:  Decreased activity tolerance, Decreased endurance, Decreased strength, Impaired perceived functional ability, Postural dysfunction, Improper body mechanics, Pain, Decreased range of motion, Hypomobility, Impaired sensation, Impaired flexibility  Visit Diagnosis: Cervicalgia  Muscle weakness (generalized)     Problem List Patient Active Problem List   Diagnosis Date Noted   Cervical radicular pain (R>L) 10/11/2021   Spinal stenosis in cervical region 10/11/2021   Cervical spondylosis 10/11/2021   S/P bilateral breast reduction 06/23/2021   Sinusitis accessory 02/03/2021   GAD (generalized anxiety disorder) 06/22/2019   Insomnia due to medical condition 06/22/2019   Needle phobia 06/22/2019   Basal cell carcinoma (BCC) in situ of skin 03/14/2019   Bilateral carpal tunnel syndrome 08/27/2018   Intractable migraine with aura without status migrainosus 08/27/2018   Polyneuropathy 08/27/2018   Cervical cancer ([place]) 08/18/2018   Basal cell carcinoma, eyelid, left 02/14/2018   Chest pain 09/23/2017   Elevated TSH 07/03/2017   Chronic otitis externa of both ears 10/15/2016   Disequilibrium 09/19/2016    Menorrhagia with irregular cycle 09/17/2016   Chronic idiopathic urticaria 09/03/2016   Postoperative state 08/20/2016   Calcaneal spur 07/10/2016   Plantar fasciitis 07/10/2016   On prednisone therapy 06/20/2016   Allergic rhinitis 04/17/2016   Allergic urticaria due to ingested food 02/09/2016   Heavy drinker 04/11/2014   Depression 09/30/2013   Irregular bleeding 09/30/2013   Migraine headache 09/30/2013   Excess weight 09/30/2013   Frequent UTI 09/30/2013   Infection of urinary tract 09/30/2013   VSD (ventricular septal defect and aortic arch hypoplasia 08/05/2013   Abnormal weight gain 08/05/2013   Kristine [name] DPT Kristine [name], PT 05/09/2022, 9:44 AM  [facility] [address] [place], [address] Phone: [phone]   Fax:  [phone]  Name: Kristine [name] MRN: [id number] Date of Birth: 05-Apr-1971

## 2022-05-11 ENCOUNTER — Encounter: Payer: BC Managed Care – PPO | Admitting: Physical Therapy

## 2022-05-14 ENCOUNTER — Encounter: Payer: BC Managed Care – PPO | Admitting: Physical Therapy

## 2022-05-17 ENCOUNTER — Ambulatory Visit: Payer: BC Managed Care – PPO | Attending: Neurosurgery | Admitting: Physical Therapy

## 2022-05-17 DIAGNOSIS — M6281 Muscle weakness (generalized): Secondary | ICD-10-CM

## 2022-05-17 DIAGNOSIS — R2689 Other abnormalities of gait and mobility: Secondary | ICD-10-CM | POA: Diagnosis present

## 2022-05-17 DIAGNOSIS — M542 Cervicalgia: Secondary | ICD-10-CM

## 2022-05-17 NOTE — Therapy (Signed)
[facility] [address], [place], [address] Phone: [phone]   Fax:  [phone]  Physical Therapy Treatment/Discharge Summary  Patient Details  Name: Kristine [name] MRN: [id number] Date of Birth: 10/21/1971 No data recorded  Encounter Date: 05/17/2022   PT End of Session - 05/17/22 1455     Visit Number 10    Number of Visits 13    Date for PT Re-Evaluation 06/15/22    Authorization Type 6/10 PN on 07/22/19    Authorization - Visit Number 10    Authorization - Number of Visits 13    Progress Note Due on Visit 10    PT Start Time 1435    PT Stop Time 1515    PT Time Calculation (min) 40 min    Activity Tolerance Patient limited by pain;Other (comment)    Behavior During Therapy WFL for tasks assessed/performed             Past Medical History:  Diagnosis Date   Abnormal weight gain 08/05/2013   Absence of interventricular septum 08/05/2013   Allergic rhinitis 04/17/2016   Allergic urticaria due to ingested food 02/09/2016   Anxiety    Asthma    Cancer ([place])    melanoma-left eye and right hand   Clinical depression 69/45/0388   Complication of anesthesia    PT HAD TROUBLE BREATHING AFTER HYSTERECTOMY IN OCTOBER   Depression    Dizziness    Excess weight 09/30/2013   GERD (gastroesophageal reflux disease)    Headache, migraine 09/30/2013   Heart murmur    Heavy drinker 04/11/2014   Overview:  Patient reports drinking 2-3 bottles of wine each weekend.     History of methicillin resistant staphylococcus aureus (MRSA) 11/2015   Hypothyroidism    Infection of urinary tract 09/30/2013   Irregular bleeding 09/30/2013   Multiple allergies    Pneumonia    Pre-diabetes    VSD (ventricular septal defect and aortic arch hypoplasia     Past Surgical History:  Procedure Laterality Date   ANTERIOR CERVICAL DECOMP/DISCECTOMY FUSION N/A 02/09/2022   Procedure: C5-6 ANTERIOR CERVICAL DISCECTOMY  AND FUSION (GLOBUS HEDRON);  Surgeon: Meade Maw, MD;  Location: [facility];  Service: Neurosurgery;  Laterality: N/A;   BLEPHAROPLASTY  2019   BOTOX INJECTION N/A 10/23/2016   Procedure: BOTOX INJECTION;  Surgeon: Royston Cowper, MD;  Location: [facility];  Service: Urology;  Laterality: N/A;   BREAST BIOPSY Left 11/13/2013   benign   CESAREAN SECTION     x2   COLONOSCOPY  2006   COLONOSCOPY WITH PROPOFOL N/A 07/04/2020   Procedure: COLONOSCOPY WITH PROPOFOL;  Surgeon: Lesly Rubenstein, MD;  Location: [facility];  Service: Endoscopy;  Laterality: N/A;   COMBINED REDUCTION MAMMAPLASTY W/ ABDOMINOPLASTY Bilateral 05/2021   CORONARY ANGIOPLASTY  2013   COSMETIC SURGERY  06/09/2021   tummy tuck   CYSTOSCOPY N/A 10/23/2016   Procedure: CYSTOSCOPY;  Surgeon: Royston Cowper, MD;  Location: [facility];  Service: Urology;  Laterality: N/A;   CYSTOSCOPY WITH HYDRODISTENSION AND BIOPSY     ESOPHAGOGASTRODUODENOSCOPY     LAPAROSCOPIC ASSISTED VAGINAL HYSTERECTOMY N/A 08/20/2016   Procedure: LAPAROSCOPIC ASSISTED VAGINAL HYSTERECTOMY;  Surgeon: Boykin Nearing, MD;  Location: [facility];  Service: Gynecology;  Laterality: N/A;   LAPAROSCOPIC BILATERAL SALPINGECTOMY Bilateral 08/20/2016   Procedure: LAPAROSCOPIC BILATERAL SALPINGECTOMY;  Surgeon: Boykin Nearing, MD;  Location: [facility];  Service: Gynecology;  Laterality: Bilateral;  NASAL SINUS SURGERY     x12   TONSILLECTOMY     TUBAL LIGATION  2003    There were no vitals filed for this visit.   Subjective Assessment - 05/18/22 0913     Subjective Pt reports she saw her rheumatoid doctor who started her on a medication that made her feel awful over the past week. She reports she is having minimal neck pain today, reporting 5/10 pain. Is having decreased migraines today, was having more with new medication. Not completing HEP due to being out of town. Reports she is reluctant to report that she fell twice while on  vacation. She reports not knowing what caused her to fall, each time she was walking "and just hit the ground". Endorses dizziness with 1 fall. Reports hitting her head with one fall, did not seek medical care, PT encouraged patient to seek medical care any time she falls and hits her head.    Pertinent History Pt arrives to PT s/p ACDF surgery on 02/09/22 at level C5-C6 -- referred to PT specifically for dry needling. She states she has a phobia of needles however has had DN in the past. She was okay with DN of upper traps but did not handle DN to cervical region well and did not return to PT following that visit (went to [facility]). Majority of pain is described in bilateral upper traps, bilateral SCM worst near mastoid process, posterior cervical region, and left jaw. She does report having TMJ. She also endorses frequent migraines; she does receive Botox injections every 3 months by Dr. Brigitte Pulse at [facility]. Her next scheduled injection is in June. She reports N&T in bilateral hands and feet for the last 6-7 years, she reports symptoms remain present following surgery. She continues to experience decreased dexterity in bilateral hands - states her husband helps with buttoning shorts/pants, tieing shoes and other dressing/hygiene activities. Before surgery,  she was dropping a lot of objects due to weak grip strength. Since surgery that has improved a lot. Prior to surgery, pt was visiting a chiropractor and massage therapist regularly to address chronic muscle tightness. She reports having severely limited cervical ROM for years and gaining ROM is not a goal of PT. Pt goal for PT is to be pain free. She reports being allergic to pain meds - causing skin irritation such as rashes and itching. She is refusing further meds related to pain control. Currently, pt is not sleeping well due to pain, unable to get comfortable in side lying and cannot breath due to sinuses when supine. 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 She has been having difficulty at work due to bilateral shoulder weakness and fatigue. PMH includes: VSD (ventricular septal defect), allergic rhinitis, UTI, depression, migraines, polyneuropathy, bilateral carpal tunnel syndrome, ADD, anxiety, basal cell carcinoma, fibroids, GERD.    Limitations Lifting;Other (comment);Reading;Writing;House hold activities    How long can you sit comfortably? unlimited    How long can you stand comfortably? unlimited    How long can you walk comfortably? not limited by cervical pain    Patient Stated Goals to be pain free and not have to take additional medications for pain control    Pain Onset More than a month ago    Pain Onset More than a month ago               Cervical ROM R/L Rotation : 50;  47d Lateral flex 19; 30d Flex WNL Ext 25%    Ther-Ex Nustep seat 6 UE 9 58mns L2 for gentle mobility and strengthening   PT reviewed the following HEP with patient with patient able to demonstrate a set of the following with min cuing for correction needed. PT educated patient on parameters of therex (how/when to inc/decrease intensity, frequency, rep/set range, stretch hold time, and purpose of therex) with verbalized understanding.   Access Code: [email or web address] - Prone Scapular Retraction Y  - 1 x weekly - 3 sets - 6-10 reps - Seated Shoulder Horizontal Abduction with Resistance  - 1 x weekly - 3 sets - 6-10 reps - Standing Shoulder Row with Anchored Resistance  - 1 x daily - 1 x weekly - 3 sets - 6-10 reps - Seated Cervical Sidebending Stretch  - 2 x daily - 7 x weekly - 30-60sec hold - Seated Levator Scapulae Stretch  - 2 x daily - 7 x weekly - 30-60sec hold                          PT Education - 05/17/22 1456     Education Details therex form/technique    Person(s) Educated Patient    Methods Explanation;Demonstration;Verbal cues     Comprehension Verbalized understanding;Returned demonstration;Verbal cues required                 PT Long Term Goals - 05/17/22 1446       PT LONG TERM GOAL #1   Title Pt will decrease worst neck pain as reported on NPRS by at least 2 points in order to demonstrate clinically significant reduction in back pain.    Baseline 03/13/22: 10/10 05/17/22 5/10    Time 6    Period Weeks    Status Achieved      PT LONG TERM GOAL #2   Title Pt will demonstrate decrease in NDI by at least 19% in order to demonstrate clinically significant reduction in disability related to neck injury/pain    Baseline 03/13/22: next session    Time 6    Period Weeks    Status Deferred      PT LONG TERM GOAL #3   Title Patient will increase FOTO score to equal to or greater than 47 to demonstrate statistically significant improvement in mobility and quality of life.    Baseline 03/13/22: next session; 05/17/22 56    Time 6    Period Weeks    Status Achieved                   Plan - 05/18/22 0915     Clinical Impression Statement PT reassessed goals this session where patietn has met goals to d/c PT for orthopedic neck issues. Patient with reduced neck pain and neck pain/mobility not inhibiting ADLs. paitent with some decreased mobility in rotation and ext, reports this is limited d/t "becoming dizzy" than true pain. patient continues to have UT tension and oceractivation with attempt at therex for scapular retractors, but is able to self identify and correct this very well. UT tension probably also made worse by migraines that patient is being followed by MD for. Patient is able to comply with cuing for proper technique of all HEP therex and verbalize understanding of parameters. Pt most inhibited in function by balance disturbances while walking, and is unable to pinpoint exactly what is making her fall, other than always during ambulation. PT and pt agree that  PT would be more effective to focus on  this at this time, as this is greatly impacting her QOL. PT reached out to referring MD for order to evaluate this next week. D/c for orthopedic cervical issues at this time.    Personal Factors and Comorbidities Comorbidity 3+;Fitness;Past/Current Experience;Profession;Social Background;Time since onset of injury/illness/exacerbation    Comorbidities cervical cancer, bilateral carpal tunnel syndrome, polyneuropathy, depression, migraines, VSD, ADD, anxiety, GERD    Examination-Activity Limitations Bathing;Reach Overhead;Bed Mobility;Dressing;Hygiene/Grooming;Lift    Examination-Participation Restrictions Cleaning;Community Activity;Interpersonal Relationship;Laundry;Shop;Yard Work;Occupation    Stability/Clinical Decision Making Evolving/Moderate complexity    Clinical Decision Making Moderate    Rehab Potential Fair    PT Frequency 2x / week    PT Duration 6 weeks    PT Treatment/Interventions ADLs/Self Care Home Management;Cryotherapy;Electrical Stimulation;Iontophoresis 88m/ml Dexamethasone;Moist Heat;Ultrasound;Therapeutic activities;Therapeutic exercise;Manual techniques;Passive range of motion;Dry needling;Energy conservation;Patient/family education;Biofeedback;Functional mobility training;Neuromuscular re-education;Vestibular    PT Next Visit Plan gentle STM, (dry needling?), light stretching, grip strength interventions, scapular stability, cervical stability    PT Home Exercise Plan N/A    Consulted and Agree with Plan of Care Patient             Patient will benefit from skilled therapeutic intervention in order to improve the following deficits and impairments:  Decreased activity tolerance, Decreased endurance, Decreased strength, Impaired perceived functional ability, Postural dysfunction, Improper body mechanics, Pain, Decreased range of motion, Hypomobility, Impaired sensation, Impaired flexibility  Visit Diagnosis: Cervicalgia  Muscle weakness  (generalized)     Problem List Patient Active Problem List   Diagnosis Date Noted   Cervical radicular pain (R>L) 10/11/2021   Spinal stenosis in cervical region 10/11/2021   Cervical spondylosis 10/11/2021   S/P bilateral breast reduction 06/23/2021   Sinusitis accessory 02/03/2021   GAD (generalized anxiety disorder) 06/22/2019   Insomnia due to medical condition 06/22/2019   Needle phobia 06/22/2019   Basal cell carcinoma (BCC) in situ of skin 03/14/2019   Bilateral carpal tunnel syndrome 08/27/2018   Intractable migraine with aura without status migrainosus 08/27/2018   Polyneuropathy 08/27/2018   [place]ical cancer [place] 08/18/2018   Basal cell carcinoma, eyelid, left 02/14/2018   Chest pain 09/23/2017   Elevated TSH 07/03/2017   Chronic otitis externa of both ears 10/15/2016   Disequilibrium 09/19/2016   Menorrhagia with irregular cycle 09/17/2016   Chronic idiopathic urticaria 09/03/2016   Postoperative state 08/20/2016   Calcaneal spur 07/10/2016   Plantar fasciitis 07/10/2016   On prednisone therapy 06/20/2016   Allergic rhinitis 04/17/2016   Allergic urticaria due to ingested food 02/09/2016   Heavy drinker 04/11/2014   Depression 09/30/2013   Irregular bleeding 09/30/2013   Migraine headache 09/30/2013   Excess weight 09/30/2013   Frequent UTI 09/30/2013   Infection of urinary tract 09/30/2013   VSD (ventricular septal defect and aortic arch hypoplasia 08/05/2013   Abnormal weight gain 08/05/2013   CDurwin RegesDPT CDurwin Reges PT 05/18/2022, 9:20 AM  [facility] [address] [place] [address]Phone: [phone]  Fax:  [phone] Name: Kristine TETERSMRN: [id number] of Birth: 607/01/1971

## 2022-05-18 ENCOUNTER — Encounter: Payer: Self-pay | Admitting: Physical Therapy

## 2022-05-18 ENCOUNTER — Telehealth: Payer: Self-pay

## 2022-05-18 DIAGNOSIS — R2689 Other abnormalities of gait and mobility: Secondary | ICD-10-CM

## 2022-05-18 NOTE — Telephone Encounter (Signed)
Received message from [facility] (pt's physical therapist) requesting an order for PT because Kristine [name] is having difficulty with balance and gait. OK to place order per Dr Izora Ribas.

## 2022-05-19 ENCOUNTER — Emergency Department
Admission: EM | Admit: 2022-05-19 | Discharge: 2022-05-19 | Disposition: A | Discharge status: Home / Self Care | Payer: BC Managed Care – PPO | Attending: Student in an Organized Health Care Education/Training Program | Admitting: Student in an Organized Health Care Education/Training Program

## 2022-05-19 ENCOUNTER — Other Ambulatory Visit: Payer: Self-pay

## 2022-05-19 ENCOUNTER — Encounter: Payer: Self-pay | Admitting: Emergency Medicine

## 2022-05-19 DIAGNOSIS — Z85840 Personal history of malignant neoplasm of eye: Secondary | ICD-10-CM | POA: Insufficient documentation

## 2022-05-19 DIAGNOSIS — Z85828 Personal history of other malignant neoplasm of skin: Secondary | ICD-10-CM | POA: Insufficient documentation

## 2022-05-19 DIAGNOSIS — J45909 Unspecified asthma, uncomplicated: Secondary | ICD-10-CM | POA: Diagnosis not present

## 2022-05-19 DIAGNOSIS — H5789 Other specified disorders of eye and adnexa: Secondary | ICD-10-CM | POA: Insufficient documentation

## 2022-05-19 DIAGNOSIS — J0140 Acute pansinusitis, unspecified: Secondary | ICD-10-CM | POA: Diagnosis not present

## 2022-05-19 DIAGNOSIS — R0981 Nasal congestion: Secondary | ICD-10-CM | POA: Diagnosis present

## 2022-05-19 DIAGNOSIS — E039 Hypothyroidism, unspecified: Secondary | ICD-10-CM | POA: Insufficient documentation

## 2022-05-19 MED ORDER — ONDANSETRON 4 MG PO TBDP
4.0000 mg | ORAL_TABLET | Freq: Once | ORAL | Status: AC
  Administered 2022-05-19: 4 mg via ORAL
  Filled 2022-05-19: qty 1

## 2022-05-19 MED ORDER — ONDANSETRON 4 MG PO TBDP: 4.0000 mg | ORAL_TABLET | Freq: Three times a day (TID) | ORAL | 0 refills | Status: AC | PRN

## 2022-05-19 MED ORDER — DOXYCYCLINE HYCLATE 100 MG PO CAPS: 100.0000 mg | ORAL_CAPSULE | Freq: Two times a day (BID) | ORAL | 0 refills | Status: DC

## 2022-05-19 MED ORDER — DEXAMETHASONE SODIUM PHOSPHATE 10 MG/ML IJ SOLN
10.0000 mg | Freq: Once | INTRAMUSCULAR | Status: AC
  Administered 2022-05-19: 10 mg via INTRAMUSCULAR
  Filled 2022-05-19: qty 1

## 2022-05-19 NOTE — ED Triage Notes (Signed)
Pt reports sinus infection for about 2 weeks. Pt reports sinus pressure and pain, nasal drainage, eye redness and drainage and feels bad. Pt reports OTC meds not helping.

## 2022-05-19 NOTE — ED Provider Notes (Signed)
[facility] Provider Note    Event Date/Time   First MD Initiated Contact with Patient 05/19/22 1443     (approximate)   History   Facial Pain, Nasal Congestion, and Eye Drainage   HPI  Kristine [name] is a 51 y.o. female presents to the emergency department for treatment and evaluation of sinus pain and pressure that has been present for the past 2 weeks.  She states that this has progressively worsened to the point of having a migraine headache.  She was given a Toradol shot and Phenergan shot yesterday by primary care without improvement.  She feels that she needs an antibiotic and may be steroid shot.  Past Medical History:  Diagnosis Date   Abnormal weight gain 08/05/2013   Absence of interventricular septum 08/05/2013   Allergic rhinitis 04/17/2016   Allergic urticaria due to ingested food 02/09/2016   Anxiety    Asthma    Cancer ([place])    melanoma-left eye and right hand   Clinical depression 35/46/5681   Complication of anesthesia    PT HAD TROUBLE BREATHING AFTER HYSTERECTOMY IN OCTOBER   Depression    Dizziness    Excess weight 09/30/2013   GERD (gastroesophageal reflux disease)    Headache, migraine 09/30/2013   Heart murmur    Heavy drinker 04/11/2014   Overview:  Patient reports drinking 2-3 bottles of wine each weekend.     History of methicillin resistant staphylococcus aureus (MRSA) 11/2015   Hypothyroidism    Infection of urinary tract 09/30/2013   Irregular bleeding 09/30/2013   Multiple allergies    Pneumonia    Pre-diabetes    VSD (ventricular septal defect and aortic arch hypoplasia      Physical Exam   Triage Vital Signs: ED Triage Vitals  Enc Vitals Group     BP 05/19/22 1439 124/75     Pulse Rate 05/19/22 1439 79     Resp 05/19/22 1439 16     Temp 05/19/22 1439 98.4 F (36.9 C)     Temp Source 05/19/22 1439 Oral     SpO2 05/19/22 1439 98 %     Weight 05/19/22 1436 163 lb 2.3 oz (74 kg)     Height 05/19/22  1436 '5\' 2"'$  (1.575 m)     Head Circumference --      Peak Flow --      Pain Score 05/19/22 1436 10     Pain Loc --      Pain Edu? --      Excl. in [place]? --     Most recent vital signs: Vitals:   05/19/22 1439  BP: 124/75  Pulse: 79  Resp: 16  Temp: 98.4 F (36.9 C)  SpO2: 98%    General: Awake, no distress.  CV:  Good peripheral perfusion.  Resp:  Normal effort.  Abd:  No distention.  Other:  Sinus tenderness bilateral.    ED Results / Procedures / Treatments   Labs (all labs ordered are listed, but only abnormal results are displayed) Labs Reviewed - No data to display   EKG  Not indicated.   RADIOLOGY  Not indicated  I have independently reviewed and interpreted imaging as well as reviewed report from radiology.  PROCEDURES:  Critical Care performed: No  Procedures   MEDICATIONS ORDERED IN ED:  Medications  dexamethasone (DECADRON) injection 10 mg (has no administration in time range)  ondansetron (ZOFRAN-ODT) disintegrating tablet 4 mg (has no administration in time range)  IMPRESSION / MDM / ASSESSMENT AND PLAN / ED COURSE   I reviewed the triage vital signs and the nursing notes.  Differential diagnosis includes, but is not limited to: Sinusitis, migraine, seasonal allergies  Patient's presentation is most consistent with acute, uncomplicated illness.  51 year old female presenting to the emergency department with sinus pain and pressure.  See HPI for further details.  Treatment options discussed with the patient.  She would prefer to have Decadron injection here instead of a taper Dosepak.  She has multiple drug allergies but has not had any adverse effects with doxycycline.  Prescription sent to her pharmacy.  She is also requesting prescription for Zofran which was also submitted.  She was discharged home with plan to follow-up with her primary care provider if not improving over the week.  If symptoms change or worsen and she is unable  to schedule appointment, she is to return to the emergency department.     FINAL CLINICAL IMPRESSION(S) / ED DIAGNOSES   Final diagnoses:  Acute pansinusitis, recurrence not specified     Rx / DC Orders   ED Discharge Orders          Ordered    doxycycline (VIBRAMYCIN) 100 MG capsule  2 times daily        05/19/22 1507    ondansetron (ZOFRAN-ODT) 4 MG disintegrating tablet  Every 8 hours PRN        05/19/22 1507             Note:  This document was prepared using Dragon voice recognition software and may include unintentional dictation errors.   Victorino Dike, FNP 05/19/22 1740    Merlyn Lot, MD 05/19/22 [phone]

## 2022-05-19 NOTE — ED Notes (Signed)
Pt refuse DC vitals and gave verbal consent to DC

## 2022-05-22 ENCOUNTER — Ambulatory Visit: Payer: BC Managed Care – PPO | Admitting: Physical Therapy

## 2022-05-22 ENCOUNTER — Encounter: Payer: Self-pay | Admitting: Physical Therapy

## 2022-05-22 DIAGNOSIS — M542 Cervicalgia: Secondary | ICD-10-CM | POA: Diagnosis not present

## 2022-05-22 DIAGNOSIS — R2689 Other abnormalities of gait and mobility: Secondary | ICD-10-CM

## 2022-05-22 NOTE — Therapy (Unsigned)
[facility] [address] [place], [address] Phone: [phone]   Fax:  [phone]  Physical Therapy Evaluation  Patient Details  Name: Kristine [name] MRN: [id number] Date of Birth: November 27, 1970 Referring Provider (PT): Izora Ribas   Encounter Date: 05/22/2022   PT End of Session - 05/22/22 1501     Visit Number 1    Number of Visits 18    Date for PT Re-Evaluation 07/20/22    Authorization - Visit Number 1    Progress Note Due on Visit 10    PT Start Time 1430    PT Stop Time 4034    PT Time Calculation (min) 45 min    Behavior During Therapy [facility] for tasks assessed/performed             Past Medical History:  Diagnosis Date   Abnormal weight gain 08/05/2013   Absence of interventricular septum 08/05/2013   Allergic rhinitis 04/17/2016   Allergic urticaria due to ingested food 02/09/2016   Anxiety    Asthma    Cancer ([place])    melanoma-left eye and right hand   Clinical depression 74/25/9563   Complication of anesthesia    PT HAD TROUBLE BREATHING AFTER HYSTERECTOMY IN OCTOBER   Depression    Dizziness    Excess weight 09/30/2013   GERD (gastroesophageal reflux disease)    Headache, migraine 09/30/2013   Heart murmur    Heavy drinker 04/11/2014   Overview:  Patient reports drinking 2-3 bottles of wine each weekend.     History of methicillin resistant staphylococcus aureus (MRSA) 11/2015   Hypothyroidism    Infection of urinary tract 09/30/2013   Irregular bleeding 09/30/2013   Multiple allergies    Pneumonia    Pre-diabetes    VSD (ventricular septal defect and aortic arch hypoplasia     Past Surgical History:  Procedure Laterality Date   ANTERIOR CERVICAL DECOMP/DISCECTOMY FUSION N/A 02/09/2022   Procedure: C5-6 ANTERIOR CERVICAL DISCECTOMY AND FUSION (GLOBUS HEDRON);  Surgeon: Meade Maw, MD;  Location: [facility];  Service: Neurosurgery;  Laterality: N/A;   BLEPHAROPLASTY   2019   BOTOX INJECTION N/A 10/23/2016   Procedure: BOTOX INJECTION;  Surgeon: Royston Cowper, MD;  Location: [facility];  Service: Urology;  Laterality: N/A;   BREAST BIOPSY Left 11/13/2013   benign   CESAREAN SECTION     x2   COLONOSCOPY  2006   COLONOSCOPY WITH PROPOFOL N/A 07/04/2020   Procedure: COLONOSCOPY WITH PROPOFOL;  Surgeon: Lesly Rubenstein, MD;  Location: [facility];  Service: Endoscopy;  Laterality: N/A;   COMBINED REDUCTION MAMMAPLASTY W/ ABDOMINOPLASTY Bilateral 05/2021   CORONARY ANGIOPLASTY  2013   COSMETIC SURGERY  06/09/2021   tummy tuck   CYSTOSCOPY N/A 10/23/2016   Procedure: CYSTOSCOPY;  Surgeon: Royston Cowper, MD;  Location: [facility];  Service: Urology;  Laterality: N/A;   CYSTOSCOPY WITH HYDRODISTENSION AND BIOPSY     ESOPHAGOGASTRODUODENOSCOPY     LAPAROSCOPIC ASSISTED VAGINAL HYSTERECTOMY N/A 08/20/2016   Procedure: LAPAROSCOPIC ASSISTED VAGINAL HYSTERECTOMY;  Surgeon: Boykin Nearing, MD;  Location: [facility];  Service: Gynecology;  Laterality: N/A;   LAPAROSCOPIC BILATERAL SALPINGECTOMY Bilateral 08/20/2016   Procedure: LAPAROSCOPIC BILATERAL SALPINGECTOMY;  Surgeon: Boykin Nearing, MD;  Location: [facility];  Service: Gynecology;  Laterality: Bilateral;   NASAL SINUS SURGERY     x12   TONSILLECTOMY     TUBAL LIGATION  2003    There were no vitals filed  for this visit.    Subjective Assessment - 05/22/22 1345     Pertinent History Pt is a 51 year old female familiar with this clinic, d/c'd last week for cervical orthopedic impairments, being evaluated today for balance issues with gait that are the most limiting factor to her QOL currently. Pt reports approx 20 falls in the last 6 months, all occuring when she is walking. She was having sinus issues previously that she reports was giving her vertigo symptoms that has been improving since recent visit to ER for antibiotics; reporting vertigo was a contributer to some her more recent  falls. Generally she thinks her falls occur because her "feet are getting crossed up". Pt feels as though she is scuffing her foot and reports she always falls forward. Has falls regardless of surface, is not worse/better in daytime or nighttime. Pt reports diminished sensation in bilat feet and ankle, d/t unexplained neuropathy (pt reports swelling d/t this). Fall frequency has not been impacted by previous cervical surgery. 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 Patient reports being fearful of falling, and this makes her not wanting to go out in the community. She can ambulate about 23mns if she is focuing on walking before she feels like she may trip. Pt has frequent migraines, reporting nearly every day with aura, but this improvig with sinus improvement as well. Pt denies N/V, B&B changes, unexplained weight fluctuation, saddle paresthesia, fever, night sweats, or unrelenting night pain at this time.    Limitations House hold activities    How long can you sit comfortably? unlimited    How long can you stand comfortably? unlimited    How long can you walk comfortably? 337m    Diagnostic tests none for this issue    Patient Stated Goals decrease fall risk                   OBJECTIVE  Musculoskeletal Tremor: Absent Bulk: Normal Tone: Normal, no clonus   Posture Pitched forward lower crossed   Gait Maintained lower crossed, nose over toes, near toe walking with minimal heel strike   Strength R/L STS: 9.54sec 4/4+ Ankle Plantarflexion 5/5 Ankle Dorsiflexion   NEUROLOGICAL: Cranial Nerves Visual acuity and visual fields are intact  Extraocular muscles are intact  Facial sensation is intact bilaterally  Facial strength is intact bilaterally  Hearing is normal as tested by gross conversation Palate elevates midline, normal phonation  Shoulder shrug strength is intact  Tongue protrudes midline   Sensation dermatomes  in tact C2-T2/L2-S2 respectively. With "sock pattern" diminished sensation bilat feet (neuropathy) Proprioception and hot/cold testing deferred on this date  Reflexes R/L 2+/2+ Knee Jerk (L3/4) Diminished bilat Ankle Jerk (S1/2)   Coordination/Cerebellar Finger to Nose: WNL Heel to Shin: WNL Rapid alternating movements: WNL Finger Opposition: WNL Pronator Drift: Negative   FUNCTIONAL OUTCOME MEASURES   Results Comments    FGA 21/30   10 Meter Gait Speed Self selected:  0.9567mFastest: 1.55m38melow community amb norms  5TSTS  9.54seconds      POSTURAL CONTROL TESTS   Modified Clinical Test of Sensory Interaction for Balance    (CTSIB):  CONDITION TIME STRATEGY SWAY  Eyes open, firm surface 30 seconds ankle   Eyes closed, firm surface 3 seconds stepping   Eyes open, foam surface 9 seconds MinA   Eyes closed, foam surface 3 seconds minA      Ther-Ex PT reviewed the following HEP with patient with  patient able to demonstrate a set of the following with min cuing for correction needed. PT educated patient on parameters of therex (how/when to inc/decrease intensity, frequency, rep/set range, stretch hold time, and purpose of therex) with verbalized understanding.  Toe raises sitting 3x 20 (attempted in standing, unable) Education on postural correction with carry over into ambulation with understanding             [facility] PT Assessment - 05/23/22 0001       Assessment   Medical Diagnosis balance disturbances with gait    Referring Provider (PT) Izora Ribas    Hand Dominance Right    Next MD Visit unknown    Prior Therapy yes      Balance Screen   Has the patient fallen in the past 6 months Yes    How many times? 20    Has the patient had a decrease in activity level because of a fear of falling?  Yes    Is the patient reluctant to leave their home because of a fear of falling?  No      Functional Gait  Assessment   Gait Level Surface Walks 20 ft in less  than 7 sec but greater than 5.5 sec, uses assistive device, slower speed, mild gait deviations, or deviates 6-10 in outside of the 12 in walkway width.    Change in Gait Speed Able to change speed, demonstrates mild gait deviations, deviates 6-10 in outside of the 12 in walkway width, or no gait deviations, unable to achieve a major change in velocity, or uses a change in velocity, or uses an assistive device.    Gait with Horizontal Head Turns Performs head turns smoothly with slight change in gait velocity (eg, minor disruption to smooth gait path), deviates 6-10 in outside 12 in walkway width, or uses an assistive device.    Gait with Vertical Head Turns Performs head turns with no change in gait. Deviates no more than 6 in outside 12 in walkway width.    Gait and Pivot Turn Pivot turns safely within 3 sec and stops quickly with no loss of balance.    Step Over Obstacle Is able to step over one shoe box (4.5 in total height) without changing gait speed. No evidence of imbalance.    Gait with Narrow Base of Support Ambulates 4-7 steps.    Gait with Eyes Closed Walks 20 ft, slow speed, abnormal gait pattern, evidence for imbalance, deviates 10-15 in outside 12 in walkway width. Requires more than 9 sec to ambulate 20 ft.    Ambulating Backwards Walks 20 ft, uses assistive device, slower speed, mild gait deviations, deviates 6-10 in outside 12 in walkway width.    Steps Alternating feet, no rail.    Total Score 21                Objective measurements completed on examination: See above findings.                PT Education - 05/22/22 1402     Education Details Patient was educated on diagnosis, anatomy and pathology involved, prognosis, role of PT, and was given an HEP, demonstrating exercise with proper form following verbal and tactile cues, and was given a paper hand out to continue exercise at home. Pt was educated on and agreed to plan of care.    Person(s) Educated  Patient    Methods Explanation;Demonstration;Verbal cues;Handout    Comprehension Verbalized understanding;Returned demonstration;Verbal cues required  PT Short Term Goals - 05/23/22 1445       PT SHORT TERM GOAL #1   Title Pt will be independent with HEP in order to improve strength and balance in order to decrease fall risk and improve function at home and work.    Baseline 05/23/22 HEP given    Time 4    Period Weeks    Status New               PT Long Term Goals - 05/23/22 1446       PT LONG TERM GOAL #1   Title Patient will increase FOTO score to 79 to demonstrate predicted increase in functional mobility to complete ADLs    Baseline 05/23/22 next visit      PT LONG TERM GOAL #2   Title Pt will increase 10MWT to at least 1.3ms in order to demonstrate clinically significant community ambulation speed.    Baseline 05/23/22 0.929m    Time 8    Period Weeks    Status New      PT LONG TERM GOAL #3   Title Pt will improve FGA to at least 22/30 points in order to demonstrate clinically significant idecreased risk for falls    Baseline 05/23/22 21/30    Time 8    Period Weeks    Status New                    Plan - 05/22/22 1502     Clinical Impression Statement Pt returning to this clinic for evaluation of balance/gait disturbance. Patient with long history of falls presenting with impairments in decreased dynamic balance, decreased static balance without visual input, decreased ankle DF strength, abnormal posture, abnormal gait, and fear of falling. Activity limitations in community ambulation distance, obstacle negotiation, and dual task ambulation inhibiting full participation in community ADLs. Pt will benefit from skilled PT to address aforementioned impairments to decrease fall risk and improve QOL    Personal Factors and Comorbidities Comorbidity 3+;Fitness;Past/Current Experience;Profession;Social Background;Time since onset of  injury/illness/exacerbation    Comorbidities cervical cancer, bilateral carpal tunnel syndrome, polyneuropathy, depression, migraines, VSD, ADD, anxiety, GERD    Examination-Activity Limitations Bathing;Reach Overhead;Bed Mobility;Dressing;Hygiene/Grooming;Lift    Examination-Participation Restrictions Cleaning;Community Activity;Interpersonal Relationship;Laundry;Shop;Yard Work;Occupation    Stability/Clinical Decision Making Evolving/Moderate complexity    Clinical Decision Making Moderate    Rehab Potential Fair    PT Frequency 2x / week    PT Duration 6 weeks    PT Treatment/Interventions ADLs/Self Care Home Management;Cryotherapy;Electrical Stimulation;Iontophoresis '4mg'$ /ml Dexamethasone;Moist Heat;Ultrasound;Therapeutic activities;Therapeutic exercise;Manual techniques;Passive range of motion;Dry needling;Energy conservation;Patient/family education;Biofeedback;Functional mobility training;Neuromuscular re-education;Vestibular;Stair training;Gait training    PT Next Visit Plan FOTOOOOO, hip ext strength assess; thomas test    PT Home Exercise Plan posture correction, toe raises    Consulted and Agree with Plan of Care Patient             Patient will benefit from skilled therapeutic intervention in order to improve the following deficits and impairments:  Decreased activity tolerance, Decreased endurance, Decreased strength, Impaired perceived functional ability, Postural dysfunction, Improper body mechanics, Pain, Decreased range of motion, Hypomobility, Impaired sensation, Impaired flexibility  Visit Diagnosis: Other abnormalities of gait and mobility     Problem List Patient Active Problem List   Diagnosis Date Noted   Cervical radicular pain (R>L) 10/11/2021   Spinal stenosis in cervical region 10/11/2021   Cervical spondylosis 10/11/2021   S/P bilateral breast reduction 06/23/2021   Sinusitis accessory 02/03/2021  GAD (generalized anxiety disorder) 06/22/2019    Insomnia due to medical condition 06/22/2019   Needle phobia 06/22/2019   Basal cell carcinoma (BCC) in situ of skin 03/14/2019   Bilateral carpal tunnel syndrome 08/27/2018   Intractable migraine with aura without status migrainosus 08/27/2018   Polyneuropathy 08/27/2018   Cervical cancer ([place]) 08/18/2018   Basal cell carcinoma, eyelid, left 02/14/2018   Chest pain 09/23/2017   Elevated TSH 07/03/2017   Chronic otitis externa of both ears 10/15/2016   Disequilibrium 09/19/2016   Menorrhagia with irregular cycle 09/17/2016   Chronic idiopathic urticaria 09/03/2016   Postoperative state 08/20/2016   Calcaneal spur 07/10/2016   Plantar fasciitis 07/10/2016   On prednisone therapy 06/20/2016   Allergic rhinitis 04/17/2016   Allergic urticaria due to ingested food 02/09/2016   Heavy drinker 04/11/2014   Depression 09/30/2013   Irregular bleeding 09/30/2013   Migraine headache 09/30/2013   Excess weight 09/30/2013   Frequent UTI 09/30/2013   Infection of urinary tract 09/30/2013   VSD (ventricular septal defect and aortic arch hypoplasia 08/05/2013   Abnormal weight gain 08/05/2013    Durwin Reges, PT 05/23/2022, 2:50 PM  [facility] [address] [place], [address] Phone: [phone]   Fax:  [phone]  Name: Kristine [name] MRN: [id number] Date of Birth: 03-09-1971

## 2022-05-24 ENCOUNTER — Ambulatory Visit: Payer: BC Managed Care – PPO | Admitting: Physical Therapy

## 2022-05-29 ENCOUNTER — Ambulatory Visit: Payer: BC Managed Care – PPO | Admitting: Physical Therapy

## 2022-05-29 ENCOUNTER — Encounter: Payer: Self-pay | Admitting: Physical Therapy

## 2022-05-29 DIAGNOSIS — M542 Cervicalgia: Secondary | ICD-10-CM | POA: Diagnosis not present

## 2022-05-29 DIAGNOSIS — R2689 Other abnormalities of gait and mobility: Secondary | ICD-10-CM

## 2022-05-29 NOTE — Therapy (Signed)
[facility] [address] [place], [address] Phone: [phone]   Fax:  [phone]  Physical Therapy Treatment  Patient Details  Name: Kristine [name] MRN: [id number] Date of Birth: [birth year] Referring Provider (PT): Izora Ribas   Encounter Date: 05/29/2022   PT End of Session - 06/14/22 0936     Number of Visits 18    Date for PT Re-Evaluation 07/20/22    Authorization Type 6/10 PN on 07/22/19    Authorization - Visit Number 2    Authorization - Number of Visits 13    Progress Note Due on Visit 10    Activity Tolerance Patient limited by pain;Other (comment)    Behavior During Therapy WFL for tasks assessed/performed              Past Medical History:  Diagnosis Date   Abnormal weight gain 08/05/2013   Absence of interventricular septum 08/05/2013   Allergic rhinitis 04/17/2016   Allergic urticaria due to ingested food 02/09/2016   Anxiety    Asthma    Cancer (Kristine [name])    melanoma-left eye and right hand   Clinical depression 76/19/5093   Complication of anesthesia    PT HAD TROUBLE BREATHING AFTER HYSTERECTOMY IN OCTOBER   Depression    Dizziness    Excess weight 09/30/2013   GERD (gastroesophageal reflux disease)    Headache, migraine 09/30/2013   Heart murmur    Heavy drinker 04/11/2014   Overview:  Patient reports drinking 2-3 bottles of wine each weekend.     History of methicillin resistant staphylococcus aureus (MRSA) 11/2015   Hypothyroidism    Infection of urinary tract 09/30/2013   Irregular bleeding 09/30/2013   Multiple allergies    Pneumonia    Pre-diabetes    VSD (ventricular septal defect and aortic arch hypoplasia     Past Surgical History:  Procedure Laterality Date   ANTERIOR CERVICAL DECOMP/DISCECTOMY FUSION N/A 02/09/2022   Procedure: C5-6 ANTERIOR CERVICAL DISCECTOMY AND FUSION (GLOBUS HEDRON);  Surgeon: Meade Maw, MD;  Location: [facility];  Service:  Neurosurgery;  Laterality: N/A;   BLEPHAROPLASTY  2019   BOTOX INJECTION N/A 10/23/2016   Procedure: BOTOX INJECTION;  Surgeon: Royston Cowper, MD;  Location: [facility];  Service: Urology;  Laterality: N/A;   BREAST BIOPSY Left 11/13/2013   benign   CESAREAN SECTION     x2   COLONOSCOPY  2006   COLONOSCOPY WITH PROPOFOL N/A 07/04/2020   Procedure: COLONOSCOPY WITH PROPOFOL;  Surgeon: Lesly Rubenstein, MD;  Location: [facility];  Service: Endoscopy;  Laterality: N/A;   COMBINED REDUCTION MAMMAPLASTY W/ ABDOMINOPLASTY Bilateral 05/2021   CORONARY ANGIOPLASTY  2013   COSMETIC SURGERY  06/09/2021   tummy tuck   CYSTOSCOPY N/A 10/23/2016   Procedure: CYSTOSCOPY;  Surgeon: Royston Cowper, MD;  Location: [facility];  Service: Urology;  Laterality: N/A;   CYSTOSCOPY WITH HYDRODISTENSION AND BIOPSY     ESOPHAGOGASTRODUODENOSCOPY     LAPAROSCOPIC ASSISTED VAGINAL HYSTERECTOMY N/A 08/20/2016   Procedure: LAPAROSCOPIC ASSISTED VAGINAL HYSTERECTOMY;  Surgeon: Boykin Nearing, MD;  Location: [facility];  Service: Gynecology;  Laterality: N/A;   LAPAROSCOPIC BILATERAL SALPINGECTOMY Bilateral 08/20/2016   Procedure: LAPAROSCOPIC BILATERAL SALPINGECTOMY;  Surgeon: Boykin Nearing, MD;  Location: [facility];  Service: Gynecology;  Laterality: Bilateral;   NASAL SINUS SURGERY     x12   TONSILLECTOMY     TUBAL LIGATION  2003    There were no vitals filed  for this visit.   Subjective Assessment - 06/14/22 0937     Subjective Pt reports some sinus pain today (between her eyebrows) 6/10. Reports antiiotics for her sinus issues are not helping her vestibular issues, and that she is getting dizzy anytime she moves her head. She thinks, and PT agrees that a vestibular consult might be the best avenue at this time. Has had one fall on beach last week, fell forward    Pertinent History Pt is a 51 year old female familiar with this clinic, d/c'd last week for cervical orthopedic impairments, being  evaluated today for balance issues with gait that are the most limiting factor to her QOL currently. Pt reports approx 20 falls in the last 6 months, all occuring when she is walking. She was having sinus issues previously that she reports was giving her vertigo symptoms that has been improving since recent visit to ER for antibiotics; reporting vertigo was a contributer to some her more recent falls. Generally she thinks her falls occur because her "feet are getting crossed up". Pt feels as though she is scuffing her foot and reports she always falls forward. Has falls regardless of surface, is not worse/better in daytime or nighttime. Pt reports diminished sensation in bilat feet and ankle, d/t unexplained neuropathy (pt reports swelling d/t this). Fall frequency has not been impacted by previous cervical surgery. 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 Patient reports being fearful of falling, and this makes her not wanting to go out in the community. She can ambulate about 5mns if she is focuing on walking before she feels like she may trip. Pt has frequent migraines, reporting nearly every day with aura, but this improvig with sinus improvement as well. Pt denies N/V, B&B changes, unexplained weight fluctuation, saddle paresthesia, fever, night sweats, or unrelenting night pain at this time.    Limitations House hold activities    How long can you sit comfortably? unlimited    How long can you stand comfortably? unlimited    How long can you walk comfortably? 359m    Diagnostic tests none for this issue    Patient Stated Goals decrease fall risk    Pain Onset More than a month ago    Pain Onset More than a month ago            Ther-Ex  Nustep seat 7 UE 9 L3 gentle rotation/strengthening Toe raise sitting x15 with minimal raise With 3# AW alt heel tap on cone on 12in step 2x 20 with CGA for safety; difficulty with R SLS > L, cuing for "light tap  on cone" with good carry over Hip ext active: ~10d bilat 4/5 MMT bilat, with "pull at front of hip: Bridge x12 *updated to HEP* Thomas stretch 68m90mbilat *updated to HEP* Heel walks 2x 60f63fait training Amb with 3# AW 300ft12frst 100ft 13fsing on heel strike, second 100ft n40flized gait; 3rd 100ft tr66f to normalize heel strike in gait) Pt able to comply with cuing for technique, patient reports some soreness at ant tib Amb with 3# AW 200ft wit2fn hurdle negotiation (7 hurdles per 100ft) CGA75f safety, cuing for controlled step over                            PT Short Term Goals - 05/23/22 1445       PT SHORT TERM GOAL #1  Title Pt will be independent with HEP in order to improve strength and balance in order to decrease fall risk and improve function at home and work.    Baseline 05/23/22 HEP given    Time 4    Period Weeks    Status New               PT Long Term Goals - 05/23/22 1446       PT LONG TERM GOAL #1   Title Patient will increase FOTO score to 79 to demonstrate predicted increase in functional mobility to complete ADLs    Baseline 05/23/22 next visit      PT LONG TERM GOAL #2   Title Pt will increase 10MWT to at least 1.73ms in order to demonstrate clinically significant community ambulation speed.    Baseline 05/23/22 0.966m    Time 8    Period Weeks    Status New      PT LONG TERM GOAL #3   Title Pt will improve FGA to at least 22/30 points in order to demonstrate clinically significant idecreased risk for falls    Baseline 05/23/22 21/30    Time 8    Period Weeks    Status New                   Plan - 06/14/22 0937     Clinical Impression Statement PT continued therex prgression for increased dynamic balance and hip/core strength and stability sieht success. Patient is able to comply with all cuing for proper technique of therex with good motivation throughout session. Pt with guarding needed for safety of  gait training, but able to complete obstacle negotiation without LOB. PT will continue progression as able.    Personal Factors and Comorbidities Comorbidity 3+;Fitness;Past/Current Experience;Profession;Social Background;Time since onset of injury/illness/exacerbation    Comorbidities cervical cancer, bilateral carpal tunnel syndrome, polyneuropathy, depression, migraines, VSD, ADD, anxiety, GERD    Examination-Activity Limitations Bathing;Reach Overhead;Bed Mobility;Dressing;Hygiene/Grooming;Lift    Examination-Participation Restrictions Cleaning;Community Activity;Interpersonal Relationship;Laundry;Shop;Yard Work;Occupation    Stability/Clinical Decision Making Evolving/Moderate complexity    Clinical Decision Making Moderate    Rehab Potential Fair    PT Frequency 2x / week    PT Duration 6 weeks    PT Treatment/Interventions ADLs/Self Care Home Management;Cryotherapy;Electrical Stimulation;Iontophoresis '4mg'$ /ml Dexamethasone;Moist Heat;Ultrasound;Therapeutic activities;Therapeutic exercise;Manual techniques;Passive range of motion;Dry needling;Energy conservation;Patient/family education;Biofeedback;Functional mobility training;Neuromuscular re-education;Vestibular;Stair training;Gait training    PT Next Visit Plan continue POC    PT Home Exercise Plan posture correction, heel raises    Consulted and Agree with Plan of Care Patient             Patient will benefit from skilled therapeutic intervention in order to improve the following deficits and impairments:  Decreased activity tolerance, Decreased endurance, Decreased strength, Impaired perceived functional ability, Postural dysfunction, Improper body mechanics, Pain, Decreased range of motion, Hypomobility, Impaired sensation, Impaired flexibility  Visit Diagnosis: Other abnormalities of gait and mobility     Problem List Patient Active Problem List   Diagnosis Date Noted   Cervical radicular pain (R>L) 10/11/2021   Spinal  stenosis in cervical region 10/11/2021   Cervical spondylosis 10/11/2021   S/P bilateral breast reduction 06/23/2021   Sinusitis accessory 02/03/2021   GAD (generalized anxiety disorder) 06/22/2019   Insomnia due to medical condition 06/22/2019   Needle phobia 06/22/2019   Basal cell carcinoma (BCC) in situ of skin 03/14/2019   Bilateral carpal tunnel syndrome 08/27/2018   Intractable migraine with aura without status migrainosus  08/27/2018   Polyneuropathy 08/27/2018   Cervical cancer ([place]) 08/18/2018   Basal cell carcinoma, eyelid, left 02/14/2018   Chest pain 09/23/2017   Elevated TSH 07/03/2017   Chronic otitis externa of both ears 10/15/2016   Disequilibrium 09/19/2016   Menorrhagia with irregular cycle 09/17/2016   Chronic idiopathic urticaria 09/03/2016   Postoperative state 08/20/2016   Calcaneal spur 07/10/2016   Plantar fasciitis 07/10/2016   On prednisone therapy 06/20/2016   Allergic rhinitis 04/17/2016   Allergic urticaria due to ingested food 02/09/2016   Heavy drinker 04/11/2014   Depression 09/30/2013   Irregular bleeding 09/30/2013   Migraine headache 09/30/2013   Excess weight 09/30/2013   Frequent UTI 09/30/2013   Infection of urinary tract 09/30/2013   VSD (ventricular septal defect and aortic arch hypoplasia 08/05/2013   Abnormal weight gain 08/05/2013   Durwin Reges DPT Durwin Reges, PT 06/14/2022, 9:42 AM  [facility] [address] [place], [address] Phone: [phone]   Fax:  [phone]  Name: NYCOLE KAWAHARA MRN: [id number] Date of Birth: [birth year]

## 2022-05-31 ENCOUNTER — Ambulatory Visit: Payer: BC Managed Care – PPO | Admitting: Physical Therapy

## 2022-05-31 ENCOUNTER — Encounter: Payer: Self-pay | Admitting: Physical Therapy

## 2022-05-31 DIAGNOSIS — M542 Cervicalgia: Secondary | ICD-10-CM | POA: Diagnosis not present

## 2022-05-31 DIAGNOSIS — R2689 Other abnormalities of gait and mobility: Secondary | ICD-10-CM

## 2022-05-31 NOTE — Therapy (Signed)
OUTPATIENT PHYSICAL THERAPY TREATMENT NOTE   Patient Name: Kristine [name] MRN: [id number] DOB:Mar 11, 1971, 51 y.o., female Today's Date: 05/31/2022  PCP: Gladstone Lighter MD REFERRING PROVIDER: Meade Maw MD   PT End of Session - 05/31/22 1435     Visit Number 3    Number of Visits 18    Date for PT Re-Evaluation 07/20/22    Authorization - Visit Number 3    Authorization - Number of Visits 13    Progress Note Due on Visit 10    PT Start Time 1430    PT Stop Time 1510    PT Time Calculation (min) 40 min    Activity Tolerance Patient limited by pain;Other (comment)    Behavior During Therapy WFL for tasks assessed/performed             Past Medical History:  Diagnosis Date   Abnormal weight gain 08/05/2013   Absence of interventricular septum 08/05/2013   Allergic rhinitis 04/17/2016   Allergic urticaria due to ingested food 02/09/2016   Anxiety    Asthma    Cancer ([place])    melanoma-left eye and right hand   Clinical depression 09/32/6712   Complication of anesthesia    PT HAD TROUBLE BREATHING AFTER HYSTERECTOMY IN OCTOBER   Depression    Dizziness    Excess weight 09/30/2013   GERD (gastroesophageal reflux disease)    Headache, migraine 09/30/2013   Heart murmur    Heavy drinker 04/11/2014   Overview:  Patient reports drinking 2-3 bottles of wine each weekend.     History of methicillin resistant staphylococcus aureus (MRSA) 11/2015   Hypothyroidism    Infection of urinary tract 09/30/2013   Irregular bleeding 09/30/2013   Multiple allergies    Pneumonia    Pre-diabetes    VSD (ventricular septal defect and aortic arch hypoplasia    Past Surgical History:  Procedure Laterality Date   ANTERIOR CERVICAL DECOMP/DISCECTOMY FUSION N/A 02/09/2022   Procedure: C5-6 ANTERIOR CERVICAL DISCECTOMY AND FUSION (GLOBUS HEDRON);  Surgeon: Meade Maw, MD;  Location: [facility];  Service: Neurosurgery;  Laterality: N/A;   BLEPHAROPLASTY  2019   BOTOX  INJECTION N/A 10/23/2016   Procedure: BOTOX INJECTION;  Surgeon: Royston Cowper, MD;  Location: [facility];  Service: Urology;  Laterality: N/A;   BREAST BIOPSY Left 11/13/2013   benign   CESAREAN SECTION     x2   COLONOSCOPY  2006   COLONOSCOPY WITH PROPOFOL N/A 07/04/2020   Procedure: COLONOSCOPY WITH PROPOFOL;  Surgeon: Lesly Rubenstein, MD;  Location: [facility];  Service: Endoscopy;  Laterality: N/A;   COMBINED REDUCTION MAMMAPLASTY W/ ABDOMINOPLASTY Bilateral 05/2021   CORONARY ANGIOPLASTY  2013   COSMETIC SURGERY  06/09/2021   tummy tuck   CYSTOSCOPY N/A 10/23/2016   Procedure: CYSTOSCOPY;  Surgeon: Royston Cowper, MD;  Location: [facility];  Service: Urology;  Laterality: N/A;   CYSTOSCOPY WITH HYDRODISTENSION AND BIOPSY     ESOPHAGOGASTRODUODENOSCOPY     LAPAROSCOPIC ASSISTED VAGINAL HYSTERECTOMY N/A 08/20/2016   Procedure: LAPAROSCOPIC ASSISTED VAGINAL HYSTERECTOMY;  Surgeon: Boykin Nearing, MD;  Location: [facility];  Service: Gynecology;  Laterality: N/A;   LAPAROSCOPIC BILATERAL SALPINGECTOMY Bilateral 08/20/2016   Procedure: LAPAROSCOPIC BILATERAL SALPINGECTOMY;  Surgeon: Boykin Nearing, MD;  Location: [facility];  Service: Gynecology;  Laterality: Bilateral;   NASAL SINUS SURGERY     x12   TONSILLECTOMY     TUBAL LIGATION  2003   Patient Active Problem List   Diagnosis Date  Noted   Cervical radicular pain (R>L) 10/11/2021   Spinal stenosis in cervical region 10/11/2021   Cervical spondylosis 10/11/2021   S/P bilateral breast reduction 06/23/2021   Sinusitis accessory 02/03/2021   GAD (generalized anxiety disorder) 06/22/2019   Insomnia due to medical condition 06/22/2019   Needle phobia 06/22/2019   Basal cell carcinoma (BCC) in situ of skin 03/14/2019   Bilateral carpal tunnel syndrome 08/27/2018   Intractable migraine with aura without status migrainosus 08/27/2018   Polyneuropathy 08/27/2018   Cervical cancer ([place]) 08/18/2018   Basal cell  carcinoma, eyelid, left 02/14/2018   Chest pain 09/23/2017   Elevated TSH 07/03/2017   Chronic otitis externa of both ears 10/15/2016   Disequilibrium 09/19/2016   Menorrhagia with irregular cycle 09/17/2016   Chronic idiopathic urticaria 09/03/2016   Postoperative state 08/20/2016   Calcaneal spur 07/10/2016   Plantar fasciitis 07/10/2016   On prednisone therapy 06/20/2016   Allergic rhinitis 04/17/2016   Allergic urticaria due to ingested food 02/09/2016   Heavy drinker 04/11/2014   Depression 09/30/2013   Irregular bleeding 09/30/2013   Migraine headache 09/30/2013   Excess weight 09/30/2013   Frequent UTI 09/30/2013   Infection of urinary tract 09/30/2013   VSD (ventricular septal defect and aortic arch hypoplasia 08/05/2013   Abnormal weight gain 08/05/2013    REFERRING DIAG: falls  THERAPY DIAG:  Other abnormalities of gait and mobility  Cervicalgia  Rationale for Evaluation and Treatment Rehabilitation  PERTINENT HISTORY: Pt is a 51 year old female familiar with this clinic, d/c'd last week for cervical orthopedic impairments, being evaluated today for balance issues with gait that are the most limiting factor to her QOL currently. Pt reports approx 20 falls in the last 6 months, all occuring when she is walking. She was having sinus issues previously that she reports was giving her vertigo symptoms that has been improving since recent visit to ER for antibiotics; reporting vertigo was a contributer to some her more recent falls. Generally she thinks her falls occur because her "feet are getting crossed up". Pt feels as though she is scuffing her foot and reports she always falls forward. Has falls regardless of surface, is not worse/better in daytime or nighttime. Pt reports diminished sensation in bilat feet and ankle, d/t unexplained neuropathy (pt reports swelling d/t this). Fall frequency has not been impacted by previous cervical surgery. 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 Patient reports being fearful of falling, and this makes her not wanting to go out in the community. She can ambulate about 36mns if she is focuing on walking before she feels like she may trip. Pt has frequent migraines, reporting nearly every day with aura, but this improvig with sinus improvement as well. Pt denies N/V, B&B changes, unexplained weight fluctuation, saddle paresthesia, fever, night sweats, or unrelenting night pain at this time.  PRECAUTIONS: none  SUBJECTIVE: Pt reports her dizziness with head turning is getting better, thinks antibiotics are helping. No falls since last visit. Reports compliance with HEP with no concerns.   PAIN:  Are you having pain? No     TODAY'S TREATMENT:  Ther-Ex  Nustep seat 7 UE 9 L3 gentle rotation/strengthening Bridge x10 with ease; attempted SL bridge with hamstring cramp; completed x12 glute set prior to each set and able to complete 2x 6 each side Hamstring stretch following 30sec Attempted bird dog hip ext; pt with limited motion; modified to prone alt hip ext alt x12 with  difficulty with hamstring activation as well Alt walking lunge x12 with SPC for balance  Step up onto 6in step with eccentric heel tap lower 2x 10 bilat with heavy cuing and demo for technique KB swing 20# 2x 8 with max cuing for glute activation with good carry over [employer] stretch x30sec bilat   PATIENT EDUCATION: Education details: therex form/technique Person educated: Patient Education method: Consulting civil engineer, Media planner, and Verbal cues Education comprehension: verbalized understanding, returned demonstration, and verbal cues required   HOME EXERCISE PROGRAM: SL Bridge  toe raises Thomas stretch   PT Short Term Goals - 05/23/22 1445       PT SHORT TERM GOAL #1   Title Pt will be independent with HEP in order to improve strength and balance in order to decrease fall risk and improve function at home  and work.    Baseline 05/23/22 HEP given    Time 4    Period Weeks    Status New              PT Long Term Goals - 05/23/22 1446       PT LONG TERM GOAL #1   Title Patient will increase FOTO score to 79 to demonstrate predicted increase in functional mobility to complete ADLs    Baseline 05/23/22 next visit      PT LONG TERM GOAL #2   Title Pt will increase 10MWT to at least 1.58ms in order to demonstrate clinically significant community ambulation speed.    Baseline 05/23/22 0.966m    Time 8    Period Weeks    Status New      PT LONG TERM GOAL #3   Title Pt will improve FGA to at least 22/30 points in order to demonstrate clinically significant idecreased risk for falls    Baseline 05/23/22 21/30    Time 8    Period Weeks    Status New              Plan - 05/31/22 1508     Clinical Impression Statement PT with postural focus this session for increased glute max/core activation and to lengthening lumbar extensors/hip flexors with success. Patietn with difficulty with glute activiation, is very hamstring dominant. Patient is very motivated throughout session with no increased pain throughout session. PT educated patietn on ant tilt posture and the lengt tension relationship of the pelvic with use of visual diagrams with understanding of corrections. PT will continue progression as able.    Personal Factors and Comorbidities Comorbidity 3+;Fitness;Past/Current Experience;Profession;Social Background;Time since onset of injury/illness/exacerbation    Comorbidities cervical cancer, bilateral carpal tunnel syndrome, polyneuropathy, depression, migraines, VSD, ADD, anxiety, GERD    Examination-Activity Limitations Bathing;Reach Overhead;Bed Mobility;Dressing;Hygiene/Grooming;Lift    Examination-Participation Restrictions Cleaning;Community Activity;Interpersonal Relationship;Laundry;Shop;Yard Work;Occupation    Stability/Clinical Decision Making Evolving/Moderate complexity     Clinical Decision Making Moderate    Rehab Potential Fair    PT Frequency 2x / week    PT Duration 6 weeks    PT Treatment/Interventions ADLs/Self Care Home Management;Cryotherapy;Electrical Stimulation;Iontophoresis '4mg'$ /ml Dexamethasone;Moist Heat;Ultrasound;Therapeutic activities;Therapeutic exercise;Manual techniques;Passive range of motion;Dry needling;Energy conservation;Patient/family education;Biofeedback;Functional mobility training;Neuromuscular re-education;Vestibular;Stair training;Gait training    PT Next Visit Plan continue POC    PT Home Exercise Plan posture correction, heel raises    Consulted and Agree with Plan of Care Patient             ChDurwin RegesPT  ChDurwin RegesPT 05/31/2022, 3:16 PM

## 2022-06-05 ENCOUNTER — Ambulatory Visit: Payer: BC Managed Care – PPO | Admitting: Physical Therapy

## 2022-06-06 ENCOUNTER — Other Ambulatory Visit: Payer: Self-pay

## 2022-06-06 ENCOUNTER — Telehealth: Payer: Self-pay

## 2022-06-06 DIAGNOSIS — L409 Psoriasis, unspecified: Secondary | ICD-10-CM

## 2022-06-06 NOTE — Telephone Encounter (Signed)
Patient's Kristine [name] is currently being denied. Patient must try and fail all three medications before coverage.  -Calcipotriene Oint/Solution WITH desoximetasone -Fluocinonide (no cream) -Henry Russel

## 2022-06-06 NOTE — Progress Notes (Signed)
ERROR

## 2022-06-07 ENCOUNTER — Encounter: Payer: BC Managed Care – PPO | Admitting: Physical Therapy

## 2022-06-07 MED ORDER — CALCIPOTRIENE 0.005 % EX OINT: TOPICAL_OINTMENT | CUTANEOUS | 0 refills | Status: DC

## 2022-06-07 MED ORDER — DESOXIMETASONE 0.05 % EX OINT: 1.0000 | TOPICAL_OINTMENT | CUTANEOUS | 0 refills | Status: DC

## 2022-06-07 NOTE — Telephone Encounter (Signed)
Prescriptions sent in

## 2022-06-12 ENCOUNTER — Encounter: Payer: BC Managed Care – PPO | Admitting: Physical Therapy

## 2022-06-12 ENCOUNTER — Ambulatory Visit: Payer: BC Managed Care – PPO | Admitting: Dermatology

## 2022-06-14 ENCOUNTER — Encounter: Payer: BC Managed Care – PPO | Admitting: Physical Therapy

## 2022-06-25 ENCOUNTER — Ambulatory Visit
Admission: RE | Admit: 2022-06-25 | Discharge: 2022-06-25 | Disposition: A | Discharge status: Home / Self Care | Payer: BC Managed Care – PPO | Source: Ambulatory Visit | Attending: Internal Medicine | Admitting: Internal Medicine

## 2022-06-25 DIAGNOSIS — Z1231 Encounter for screening mammogram for malignant neoplasm of breast: Secondary | ICD-10-CM | POA: Diagnosis present

## 2022-06-26 ENCOUNTER — Encounter: Payer: BC Managed Care – PPO | Admitting: Physical Therapy

## 2022-06-28 ENCOUNTER — Encounter: Payer: BC Managed Care – PPO | Admitting: Physical Therapy

## 2022-07-03 ENCOUNTER — Encounter: Payer: BC Managed Care – PPO | Admitting: Physical Therapy

## 2022-07-05 ENCOUNTER — Encounter: Payer: BC Managed Care – PPO | Admitting: Physical Therapy

## 2022-07-10 ENCOUNTER — Encounter: Payer: BC Managed Care – PPO | Admitting: Physical Therapy

## 2022-07-10 ENCOUNTER — Telehealth: Payer: Self-pay

## 2022-07-10 NOTE — Telephone Encounter (Signed)
-----   Message from Peggyann Shoals sent at 07/10/2022  2:04 PM EDT ----- Regarding: falling Contact: [phone] [id number] ACDF on 02/09/22// Patient has noticed that she is still falling and lately it has increased. Dr.Yarbrough did mention to her that she had something going on in her back but her neck was worse. She is still having numbness but Dr.Yarbrough told her that it might not go away. Should this be with Dr.Yarbrough or Stacy?

## 2022-07-11 NOTE — Telephone Encounter (Signed)
She confirmed appt with Stacy on 9/19 at 1pm

## 2022-07-12 ENCOUNTER — Encounter: Payer: BC Managed Care – PPO | Admitting: Physical Therapy

## 2022-07-30 NOTE — Progress Notes (Unsigned)
Referring Physician:  No referring provider defined for this encounter.  Primary Physician:  Gladstone Lighter, MD  History of Present Illness:  DOS: 02/09/22 C5-6 ACDF for cervical myelopathy  07/30/2022 Last seen by Dr. Izora Ribas on 05/08/22 and she was improving. She was in therapy for her balance.   She has constant LBP with no leg pain. Not sure how long she has been hurting. Left side is worse than right side. She has known neuropathy in her legs and both feet are numb. No specific alleviating/aggravating factors. She feels like she is walking "bent over." She does not feel like her balance is any better.   She has frequency of her bowels/bladder and sometimes does not make it to the bathroom in time. This is chronic.   History of psoriatic arthritis, migraines.   Conservative measures:  Physical therapy: doing PT for balance  Multimodal medical therapy including regular antiinflammatories: none  Injections: No epidural steroid injections  Past Surgery: ACDF C5-C6 02/09/22.    Review of Systems:  A 10 point review of systems is negative, except for the pertinent positives and negatives detailed in the HPI.  Past Medical History: Past Medical History:  Diagnosis Date   Abnormal weight gain 08/05/2013   Absence of interventricular septum 08/05/2013   Allergic rhinitis 04/17/2016   Allergic urticaria due to ingested food 02/09/2016   Anxiety    Asthma    Cancer ([place])    melanoma-left eye and right hand   Clinical depression 81/19/1478   Complication of anesthesia    PT HAD TROUBLE BREATHING AFTER HYSTERECTOMY IN OCTOBER   Depression    Dizziness    Excess weight 09/30/2013   GERD (gastroesophageal reflux disease)    Headache, migraine 09/30/2013   Heart murmur    Heavy drinker 04/11/2014   Overview:  Patient reports drinking 2-3 bottles of wine each weekend.     History of methicillin resistant staphylococcus aureus (MRSA) 11/2015   Hypothyroidism     Infection of urinary tract 09/30/2013   Irregular bleeding 09/30/2013   Multiple allergies    Pneumonia    Pre-diabetes    VSD (ventricular septal defect and aortic arch hypoplasia     Past Surgical History: Past Surgical History:  Procedure Laterality Date   ANTERIOR CERVICAL DECOMP/DISCECTOMY FUSION N/A 02/09/2022   Procedure: C5-6 ANTERIOR CERVICAL DISCECTOMY AND FUSION (GLOBUS HEDRON);  Surgeon: Meade Maw, MD;  Location: [facility];  Service: Neurosurgery;  Laterality: N/A;   BLEPHAROPLASTY  2019   BOTOX INJECTION N/A 10/23/2016   Procedure: BOTOX INJECTION;  Surgeon: Royston Cowper, MD;  Location: [facility];  Service: Urology;  Laterality: N/A;   BREAST BIOPSY Left 11/13/2013   benign   CESAREAN SECTION     x2   COLONOSCOPY  2006   COLONOSCOPY WITH PROPOFOL N/A 07/04/2020   Procedure: COLONOSCOPY WITH PROPOFOL;  Surgeon: Lesly Rubenstein, MD;  Location: [facility];  Service: Endoscopy;  Laterality: N/A;   COMBINED REDUCTION MAMMAPLASTY W/ ABDOMINOPLASTY Bilateral 05/2021   CORONARY ANGIOPLASTY  2013   COSMETIC SURGERY  06/09/2021   tummy tuck   CYSTOSCOPY N/A 10/23/2016   Procedure: CYSTOSCOPY;  Surgeon: Royston Cowper, MD;  Location: [facility];  Service: Urology;  Laterality: N/A;   CYSTOSCOPY WITH HYDRODISTENSION AND BIOPSY     ESOPHAGOGASTRODUODENOSCOPY     LAPAROSCOPIC ASSISTED VAGINAL HYSTERECTOMY N/A 08/20/2016   Procedure: LAPAROSCOPIC ASSISTED VAGINAL HYSTERECTOMY;  Surgeon: Boykin Nearing, MD;  Location: [facility];  Service: Gynecology;  Laterality:  N/A;   LAPAROSCOPIC BILATERAL SALPINGECTOMY Bilateral 08/20/2016   Procedure: LAPAROSCOPIC BILATERAL SALPINGECTOMY;  Surgeon: Boykin Nearing, MD;  Location: [facility];  Service: Gynecology;  Laterality: Bilateral;   NASAL SINUS SURGERY     x12   REDUCTION MAMMAPLASTY Bilateral 06/11/2021   TONSILLECTOMY     TUBAL LIGATION  2003    Allergies: Allergies as of 07/31/2022 - Review Complete  05/31/2022  Allergen Reaction Noted   Bee pollen Anaphylaxis 04/17/2016   Bee venom Anaphylaxis 04/17/2016   Eggs or egg-derived products Anaphylaxis 08/06/2015   Keflex [cephalexin] Anaphylaxis 08/06/2015   Milk (cow) Anaphylaxis    Milk-related compounds Anaphylaxis 08/06/2015   Penicillins Anaphylaxis 08/06/2015   Red dye Shortness Of Breath 06/17/2018   Shellfish allergy Anaphylaxis and Other (See Comments) 04/17/2016   Soy allergy Anaphylaxis 08/06/2015   Tilactase Anaphylaxis and Other (See Comments) 04/17/2016   Topamax [topiramate]  08/29/2020   Wheat bran Anaphylaxis 04/30/2016   Yellow dyes (non-tartrazine) Shortness Of Breath 01/30/2022   Ferrous gluconate Diarrhea and Nausea And Vomiting 08/05/2013   Gabapentin Other (See Comments) 11/03/2019   Hydrocodone Hives 04/17/2016   Iron Diarrhea 04/17/2016   Butalbital-apap-caffeine Other (See Comments) 02/04/2019   Codeine Rash 04/17/2016   Iodine Rash 08/20/2016   Latex Itching and Rash 04/17/2016   Meclizine Other (See Comments) 08/29/2020   Robaxin [methocarbamol] Other (See Comments) 02/09/2022    Medications: Outpatient Encounter Medications as of 07/31/2022  Medication Sig   albuterol (PROVENTIL HFA;VENTOLIN HFA) 108 (90 Base) MCG/ACT inhaler Inhale 2 puffs into the lungs every 6 (six) hours as needed for wheezing or shortness of breath.   ALPRAZolam (XANAX) 0.5 MG tablet Take 1 tablet (0.5 mg total) by mouth as directed. Take it only once a day as needed for severe panic symptoms   azelastine (ASTELIN) 0.1 % nasal spray Place 1 spray into both nostrils 2 (two) times daily.   azelastine (OPTIVAR) 0.05 % ophthalmic solution Place 1 drop into both eyes daily as needed (matted eyes in the morning).   calcipotriene (DOVONOX) 0.005 % ointment Apply topically as directed. Apply up to 5 days per week to AAs of psoriasis with Desoximetasone ointment   Calcipotriene-Betameth Diprop (WYNZORA) 0.005-0.064 % CREA Apply 1  application. topically in the morning and at bedtime.   cetirizine (ZYRTEC) 10 MG tablet Take 20 mg by mouth 2 (two) times daily.   CRANBERRY PO Take 1 capsule by mouth at bedtime.    Desoximetasone 0.05 % OINT Apply 1 Application topically as directed. Apply to AAs of psoriasis up to 5 days per week with calcipotriene ointment   desvenlafaxine (PRISTIQ) 100 MG 24 hr tablet Take 1 tablet (100 mg total) by mouth daily.   doxycycline (VIBRAMYCIN) 100 MG capsule Take 1 capsule (100 mg total) by mouth 2 (two) times daily.   EPINEPHrine 0.3 mg/0.3 mL IJ SOAJ injection Inject 0.3 mLs (0.3 mg total) into the muscle once.   fexofenadine-pseudoephedrine (ALLEGRA-D 24) 180-240 MG 24 hr tablet Take 1 tablet by mouth daily as needed (allergies).   hydrochlorothiazide (HYDRODIURIL) 25 MG tablet Take 25 mg by mouth daily.   levocetirizine (XYZAL) 5 MG tablet Take 10 mg by mouth 2 (two) times daily.   levothyroxine (SYNTHROID, LEVOTHROID) 50 MCG tablet Take 50-100 mcg by mouth See admin instructions. Take 100 mcg by mouth every other day, alternating with 50 mcg on alternate days.   methocarbamol (ROBAXIN) 500 MG tablet Take 1 tablet (500 mg total) by mouth every 8 (eight) hours  as needed for muscle spasms.   montelukast (SINGULAIR) 10 MG tablet Take 10 mg by mouth at bedtime.    MULTIPLE VITAMIN PO Take 1 tablet by mouth daily.   nitrofurantoin, macrocrystal-monohydrate, (MACROBID) 100 MG capsule Take 100 mg by mouth daily as needed (after being in water).   omalizumab (XOLAIR) 150 MG injection Inject 300 mg into the skin every 21 ( twenty-one) days.   ondansetron (ZOFRAN-ODT) 4 MG disintegrating tablet Take 1 tablet (4 mg total) by mouth every 8 (eight) hours as needed for nausea or vomiting.   polyethylene glycol (MIRALAX) 17 g packet Take 17 g by mouth daily. (Patient taking differently: Take 17 g by mouth 4 (four) times a week.)   potassium chloride (KLOR-CON) 10 MEQ tablet Take 10 mEq by mouth daily as  needed. Body cramping   SPIRIVA RESPIMAT 1.25 MCG/ACT AERS Inhale 2 puffs into the lungs in the morning.   vitamin B-12 (CYANOCOBALAMIN) 1000 MCG tablet Take 1,000 mcg by mouth daily.   vitamin C (ASCORBIC ACID) 500 MG tablet Take 500 mg by mouth at bedtime.   No facility-administered encounter medications on file as of 07/31/2022.    Social History: Social History   Tobacco Use   Smoking status: Former    Types: Cigarettes    Quit date: 11/12/2006    Years since quitting: 15.7   Smokeless tobacco: Never   Tobacco comments:    quit 2008  Vaping Use   Vaping Use: Never used  Substance Use Topics   Alcohol use: No    Alcohol/week: 0.0 standard drinks of alcohol   Drug use: No    Family Medical History: Family History  Problem Relation Age of Onset   Breast cancer Maternal Aunt    Breast cancer Paternal Aunt    Breast cancer Maternal Grandmother    Breast cancer Paternal Grandmother    Breast cancer Paternal Aunt    Bipolar disorder Paternal Aunt    Kidney cancer Maternal Uncle    Prostate cancer Paternal Grandfather    Kidney disease Other        father's side   Aneurysm Mother    Liver disease Mother    Drug abuse Mother    Alcohol abuse Mother    Anxiety disorder Mother    Depression Mother    Cancer Father    Anxiety disorder Father    Depression Father     Physical Examination: There were no vitals filed for this visit.  General: Patient is well developed, well nourished, calm, collected, and in no apparent distress. Attention to examination is appropriate.  Respiratory: Patient is breathing without any difficulty.   NEUROLOGICAL:     Awake, alert, oriented to person, place, and time.  Speech is clear and fluent. Fund of knowledge is appropriate.   Cranial Nerves: Pupils equal round and reactive to light.  Facial tone is symmetric.  Facial sensation is symmetric.  Limited ROM of lumbar spine with no pain  No abnormal lesions on exposed skin.    Strength: Side Biceps Triceps Deltoid Interossei Grip Wrist Ext. Wrist Flex.  R '4 4 4 5 4 5 5  '$ L '5 5 5 5 5 5 5   '$ Side Iliopsoas Quads Hamstring PF DF EHL  R '5 5 5 5 5 5  '$ L '5 5 5 5 5 5   '$ Reflexes are 2+ and symmetric at the biceps, triceps, brachioradialis, patella and achilles.   Hoffman's is absent.  Clonus is not present.  Bilateral upper and lower extremity sensation is diminished diffusely per patient. Worse in lower extremities.   She has point tenderness over left > right SI joint.   Gait is slow.   Medical Decision Making  Imaging: Lumbar MRI 02/05/22:  FINDINGS: Segmentation: Transitional lumbosacral anatomy. For the purposes of this dictation, L5 is partially sacralized.   Alignment:  No substantial sagittal subluxation.   Vertebrae: Vertebral body heights are maintained. No focal marrow edema to suggest acute fracture discitis/osteomyelitis. No suspicious bone lesion.   Conus medullaris and cauda equina: Conus extends to the L1-L2 level. Conus appears normal.   Paraspinal and other soft tissues: Unremarkable.   Disc levels:   T12-L1: No significant disc protrusion, foraminal stenosis, or canal stenosis.   L1-L2: No significant disc protrusion, foraminal stenosis, or canal stenosis.   L2-L3: Mild facet arthropathy. No significant disc protrusion, foraminal stenosis, or canal stenosis.   L3-L4: Mild facet arthropathy and slight disc bulging. Mild bilateral foraminal stenosis. No significant canal stenosis.   L4-L5: Mild facet arthropathy and slight disc bulging. No significant canal or foraminal stenosis.   L5-S1: Transitional anatomy. No significant canal or foraminal stenosis.   IMPRESSION: 1. Transitional lumbosacral anatomy. For the purposes of this dictation, L5 is partially sacralized. Correlation with radiographs is recommended prior to any operative intervention. 2. Lower lumbar facet arthropathy with mild bilateral foraminal stenosis  at L3-L4. No significant canal stenosis.     Electronically Signed   By: Margaretha Sheffield M.D.   On: 02/06/2022 13:20  I have personally reviewed the images and agree with the above interpretation.  Assessment and Plan: Ms. Amorin is a pleasant 51 y.o. female with constant LBP with no leg pain. Not sure how long she has been hurting. Left > right side. She has known neuropathy in her legs and both feet are numb.   Above lumbar MRI shows some facet hypertrophy and lumbar spondylosis, however I think most of her pain is from her SI joint. Also with increased pain in wrists and shoulders- may be flare of psoriatic arthritis.   Above treatment options discussed with patient and following plan made:   - Message sent to Dr. Posey Pronto with rheumatology at her request.  - Discussed possible PT for SI joint pain. She wants to hold off.  - Balance issues are multifactorial. She has neuropathy in both legs and feet are numb. She has not seen much improvement since cervical surgery. Will continue to follow.  - Keep follow up as scheduled with Dr. Izora Ribas in January for her cervical spine.   Thank you for involving me in the care of this patient.   Geronimo Boot PA-C Dept. of Neurosurgery

## 2022-07-31 ENCOUNTER — Encounter: Payer: Self-pay | Admitting: Orthopedic Surgery

## 2022-07-31 ENCOUNTER — Ambulatory Visit (INDEPENDENT_AMBULATORY_CARE_PROVIDER_SITE_OTHER): Payer: BC Managed Care – PPO | Admitting: Orthopedic Surgery

## 2022-07-31 VITALS — BP 116/78 | Ht 62.0 in | Wt 163.6 lb

## 2022-07-31 DIAGNOSIS — M47816 Spondylosis without myelopathy or radiculopathy, lumbar region: Secondary | ICD-10-CM | POA: Diagnosis not present

## 2022-07-31 DIAGNOSIS — M533 Sacrococcygeal disorders, not elsewhere classified: Secondary | ICD-10-CM | POA: Diagnosis not present

## 2022-09-24 ENCOUNTER — Ambulatory Visit: Payer: BC Managed Care – PPO | Admitting: Dermatology

## 2022-10-18 ENCOUNTER — Other Ambulatory Visit: Payer: Self-pay | Admitting: Neurology

## 2022-10-18 DIAGNOSIS — R55 Syncope and collapse: Secondary | ICD-10-CM

## 2022-10-29 ENCOUNTER — Ambulatory Visit (INDEPENDENT_AMBULATORY_CARE_PROVIDER_SITE_OTHER): Payer: BC Managed Care – PPO | Admitting: Dermatology

## 2022-10-29 ENCOUNTER — Ambulatory Visit
Admission: RE | Admit: 2022-10-29 | Discharge: 2022-10-29 | Disposition: A | Discharge status: Home / Self Care | Payer: BC Managed Care – PPO | Source: Ambulatory Visit | Attending: Neurology | Admitting: Neurology

## 2022-10-29 DIAGNOSIS — L820 Inflamed seborrheic keratosis: Secondary | ICD-10-CM

## 2022-10-29 DIAGNOSIS — R55 Syncope and collapse: Secondary | ICD-10-CM | POA: Diagnosis present

## 2022-10-29 DIAGNOSIS — L821 Other seborrheic keratosis: Secondary | ICD-10-CM

## 2022-10-29 MED ORDER — GADOBUTROL 1 MMOL/ML IV SOLN
7.0000 mL | Freq: Once | INTRAVENOUS | Status: AC | PRN
  Administered 2022-10-29: 7 mL via INTRAVENOUS

## 2022-10-29 NOTE — Progress Notes (Signed)
   Follow-Up Visit   Subjective  Kristine [name] is a 51 y.o. female who presents for the following: Skin Problem (Patient here today to have a spot checked at right forearm, itchy and bothersome. Has a few other spots).  The following portions of the chart were reviewed this encounter and updated as appropriate:   Tobacco  Allergies  Meds  Problems  Med Hx  Surg Hx  Fam Hx      Review of Systems:  No other skin or systemic complaints except as noted in HPI or Assessment and Plan.  Objective  Well appearing patient in no apparent distress; mood and affect are within normal limits.  A focused examination was performed including right arm. Relevant physical exam findings are noted in the Assessment and Plan.  Right Forearm Erythematous stuck-on, waxy papule or plaque    Assessment & Plan  Inflamed seborrheic keratosis Right Forearm  Symptomatic, irritating, patient would like treated.  Benign-appearing.  Observation.  Call clinic for new or changing lesions.    Prior to procedure, discussed risks of blister formation, small wound, skin dyspigmentation, or rare scar following cryotherapy. Recommend Vaseline ointment to treated areas while healing.   Destruction of lesion - Right Forearm  Destruction method: cryotherapy   Informed consent: discussed and consent obtained   Lesion destroyed using liquid nitrogen: Yes   Cryotherapy cycles:  2 Outcome: patient tolerated procedure well with no complications   Post-procedure details: wound care instructions given     Seborrheic Keratoses - Stuck-on, waxy, tan-brown papules and/or plaques  - Benign-appearing - Discussed benign etiology and prognosis. - Observe - Call for any changes   Return for ISK follow up 4-6 weeks.  Graciella Belton, RMA, am acting as scribe for Forest Gleason, MD .  Documentation: I have reviewed the above documentation for accuracy and completeness, and I agree with the above.  Forest Gleason, MD

## 2022-10-29 NOTE — Patient Instructions (Signed)
Cryotherapy Aftercare  Wash gently with soap and water everyday.   Apply Vaseline and Band-Aid daily until healed.     Due to recent changes in healthcare laws, you may see results of your pathology and/or laboratory studies on MyChart before the doctors have had a chance to review them. We understand that in some cases there may be results that are confusing or concerning to you. Please understand that not all results are received at the same time and often the doctors may need to interpret multiple results in order to provide you with the best plan of care or course of treatment. Therefore, we ask that you please give us 2 business days to thoroughly review all your results before contacting the office for clarification. Should we see a critical lab result, you will be contacted sooner.   If You Need Anything After Your Visit  If you have any questions or concerns for your doctor, please call our main line at [phone] and press option 4 to reach your doctor's medical assistant. If no one answers, please leave a voicemail as directed and we will return your call as soon as possible. Messages left after 4 pm will be answered the following business day.   You may also send us a message via MyChart. We typically respond to MyChart messages within 1-2 business days.  For prescription refills, please ask your pharmacy to contact our office. Our fax number is [phone].  If you have an urgent issue when the clinic is closed that cannot wait until the next business day, you can page your doctor at the number below.    Please note that while we do our best to be available for urgent issues outside of office hours, we are not available 24/7.   If you have an urgent issue and are unable to reach us, you may choose to seek medical care at your doctor's office, retail clinic, urgent care center, or emergency room.  If you have a medical emergency, please immediately call 911 or go to the  emergency department.  Pager Numbers  - Dr. Kowalski: [phone]  - Dr. Moye: [phone]  - Dr. Stewart: [phone]  In the event of inclement weather, please call our main line at [phone] for an update on the status of any delays or closures.  Dermatology Medication Tips: Please keep the boxes that topical medications come in in order to help keep track of the instructions about where and how to use these. Pharmacies typically print the medication instructions only on the boxes and not directly on the medication tubes.   If your medication is too expensive, please contact our office at [phone] option 4 or send us a message through MyChart.   We are unable to tell what your co-pay for medications will be in advance as this is different depending on your insurance coverage. However, we may be able to find a substitute medication at lower cost or fill out paperwork to get insurance to cover a needed medication.   If a prior authorization is required to get your medication covered by your insurance company, please allow us 1-2 business days to complete this process.  Drug prices often vary depending on where the prescription is filled and some pharmacies may offer cheaper prices.  The website www.goodrx.com contains coupons for medications through different pharmacies. The prices here do not account for what the cost may be with help from insurance (it may be cheaper with your insurance), but the website can   give you the price if you did not use any insurance.  - You can print the associated coupon and take it with your prescription to the pharmacy.  - You may also stop by our office during regular business hours and pick up a GoodRx coupon card.  - If you need your prescription sent electronically to a different pharmacy, notify our office through [facility] MyChart or by phone at [phone] option 4.     Si Usted Necesita Algo Despus de Su Visita  Tambin puede  enviarnos un mensaje a travs de MyChart. Por lo general respondemos a los mensajes de MyChart en el transcurso de 1 a 2 das hbiles.  Para renovar recetas, por favor pida a su farmacia que se ponga en contacto con nuestra oficina. Nuestro nmero de fax es el [phone].  Si tiene un asunto urgente cuando la clnica est cerrada y que no puede esperar hasta el siguiente da hbil, puede llamar/localizar a su doctor(a) al nmero que aparece a continuacin.   Por favor, tenga en cuenta que aunque hacemos todo lo posible para estar disponibles para asuntos urgentes fuera del horario de oficina, no estamos disponibles las 24 horas del da, los 7 das de la semana.   Si tiene un problema urgente y no puede comunicarse con nosotros, puede optar por buscar atencin mdica  en el consultorio de su doctor(a), en una clnica privada, en un centro de atencin urgente o en una sala de emergencias.  Si tiene una emergencia mdica, por favor llame inmediatamente al 911 o vaya a la sala de emergencias.  Nmeros de bper  - Dr. Kowalski: [phone]  - Dra. Moye: [phone]  - Dra. Stewart: [phone]  En caso de inclemencias del tiempo, por favor llame a nuestra lnea principal al [phone] para una actualizacin sobre el estado de cualquier retraso o cierre.  Consejos para la medicacin en dermatologa: Por favor, guarde las cajas en las que vienen los medicamentos de uso tpico para ayudarle a seguir las instrucciones sobre dnde y cmo usarlos. Las farmacias generalmente imprimen las instrucciones del medicamento slo en las cajas y no directamente en los tubos del medicamento.   Si su medicamento es muy caro, por favor, pngase en contacto con nuestra oficina llamando al [phone] y presione la opcin 4 o envenos un mensaje a travs de MyChart.   No podemos decirle cul ser su copago por los medicamentos por adelantado ya que esto es diferente dependiendo de la cobertura de su seguro.  Sin embargo, es posible que podamos encontrar un medicamento sustituto a menor costo o llenar un formulario para que el seguro cubra el medicamento que se considera necesario.   Si se requiere una autorizacin previa para que su compaa de seguros cubra su medicamento, por favor permtanos de 1 a 2 das hbiles para completar este proceso.  Los precios de los medicamentos varan con frecuencia dependiendo del lugar de dnde se surte la receta y alguna farmacias pueden ofrecer precios ms baratos.  El sitio web www.goodrx.com tiene cupones para medicamentos de diferentes farmacias. Los precios aqu no tienen en cuenta lo que podra costar con la ayuda del seguro (puede ser ms barato con su seguro), pero el sitio web puede darle el precio si no utiliz ningn seguro.  - Puede imprimir el cupn correspondiente y llevarlo con su receta a la farmacia.  - Tambin puede pasar por nuestra oficina durante el horario de atencin regular y recoger una tarjeta de cupones de GoodRx.  -   Si necesita que su receta se enve electrnicamente a una farmacia diferente, informe a nuestra oficina a travs de MyChart de [place] o por telfono llamando al [phone] y presione la opcin 4.  

## 2022-11-07 ENCOUNTER — Other Ambulatory Visit: Payer: Self-pay

## 2022-11-07 ENCOUNTER — Encounter: Payer: Self-pay | Admitting: Dermatology

## 2022-11-07 DIAGNOSIS — Z981 Arthrodesis status: Secondary | ICD-10-CM

## 2022-11-13 ENCOUNTER — Ambulatory Visit (INDEPENDENT_AMBULATORY_CARE_PROVIDER_SITE_OTHER): Payer: BC Managed Care – PPO | Admitting: Neurosurgery

## 2022-11-13 ENCOUNTER — Ambulatory Visit
Admission: RE | Admit: 2022-11-13 | Discharge: 2022-11-13 | Disposition: A | Discharge status: Home / Self Care | Payer: BC Managed Care – PPO | Attending: Neurosurgery | Admitting: Neurosurgery

## 2022-11-13 ENCOUNTER — Encounter: Payer: Self-pay | Admitting: Neurosurgery

## 2022-11-13 ENCOUNTER — Ambulatory Visit
Admission: RE | Admit: 2022-11-13 | Discharge: 2022-11-13 | Disposition: A | Discharge status: Home / Self Care | Payer: BC Managed Care – PPO | Source: Ambulatory Visit | Attending: Neurosurgery | Admitting: Neurosurgery

## 2022-11-13 VITALS — BP 148/90 | HR 72 | Temp 98.5°F | Wt 161.0 lb

## 2022-11-13 DIAGNOSIS — Z09 Encounter for follow-up examination after completed treatment for conditions other than malignant neoplasm: Secondary | ICD-10-CM

## 2022-11-13 DIAGNOSIS — Z981 Arthrodesis status: Secondary | ICD-10-CM | POA: Diagnosis present

## 2022-11-13 DIAGNOSIS — M533 Sacrococcygeal disorders, not elsewhere classified: Secondary | ICD-10-CM | POA: Diagnosis not present

## 2022-11-13 DIAGNOSIS — M47816 Spondylosis without myelopathy or radiculopathy, lumbar region: Secondary | ICD-10-CM | POA: Diagnosis not present

## 2022-11-13 NOTE — Progress Notes (Signed)
Referring Physician:  Gladstone Lighter, MD [place],  [place] [address]  Primary Physician:  Kristine Lighter, MD  History of Present Illness:  DOS: 02/09/22 C5-6 ACDF for cervical myelopathy  11/13/2022 [name] presents today with improvement in her symptoms from her neck.  She continues to have some symptoms of chronic myelopathy.  She is having severe migraine headache currently.   07/31/2022 (from [place] note) Last seen by Dr. Izora [name] on 05/08/22 and she was improving. She was in therapy for her balance.   She has constant LBP with no leg pain. Not sure how long she has been hurting. Left side is worse than right side. She has known neuropathy in her legs and both feet are numb. No specific alleviating/aggravating factors. She feels like she is walking "bent over." She does not feel like her balance is any better.   She has frequency of her bowels/bladder and sometimes does not make it to the bathroom in time. This is chronic.   History of psoriatic arthritis, migraines.   Conservative measures:  Physical therapy: doing PT for balance  Multimodal medical therapy including regular antiinflammatories: none  Injections: No epidural steroid injections  Past Surgery: ACDF C5-C6 02/09/22.    Review of Systems:  A 10 point review of systems is negative, except for the pertinent positives and negatives detailed in the HPI.  Past Medical History: Past Medical History:  Diagnosis Date   Abnormal weight gain 08/05/2013   Absence of interventricular septum 08/05/2013   Allergic rhinitis 04/17/2016   Allergic urticaria due to ingested food 02/09/2016   Anxiety    Asthma    Cancer ([place])    melanoma-left eye and right hand   Clinical depression 52/77/8242   Complication of anesthesia    PT HAD TROUBLE BREATHING AFTER HYSTERECTOMY IN OCTOBER   Depression    Dizziness    Excess weight 09/30/2013   GERD (gastroesophageal reflux disease)     Headache, migraine 09/30/2013   Heart murmur    Heavy drinker 04/11/2014   Overview:  Patient reports drinking 2-3 bottles of wine each weekend.     History of methicillin resistant staphylococcus aureus (MRSA) 11/2015   Hypothyroidism    Infection of urinary tract 09/30/2013   Irregular bleeding 09/30/2013   Multiple allergies    Pneumonia    Pre-diabetes    VSD (ventricular septal defect and aortic arch hypoplasia     Past Surgical History: Past Surgical History:  Procedure Laterality Date   ANTERIOR CERVICAL DECOMP/DISCECTOMY FUSION N/A 02/09/2022   Procedure: C5-6 ANTERIOR CERVICAL DISCECTOMY AND FUSION (GLOBUS HEDRON);  Surgeon: Kristine Maw, MD;  Location: [facility];  Service: Neurosurgery;  Laterality: N/A;   BLEPHAROPLASTY  2019   BOTOX INJECTION N/A 10/23/2016   Procedure: BOTOX INJECTION;  Surgeon: Royston Cowper, MD;  Location: [facility];  Service: Urology;  Laterality: N/A;   BREAST BIOPSY Left 11/13/2013   benign   CESAREAN SECTION     x2   COLONOSCOPY  2006   COLONOSCOPY WITH PROPOFOL N/A 07/04/2020   Procedure: COLONOSCOPY WITH PROPOFOL;  Surgeon: Lesly Rubenstein, MD;  Location: [facility];  Service: Endoscopy;  Laterality: N/A;   COMBINED REDUCTION MAMMAPLASTY W/ ABDOMINOPLASTY Bilateral 05/2021   CORONARY ANGIOPLASTY  2013   COSMETIC SURGERY  06/09/2021   tummy tuck   CYSTOSCOPY N/A 10/23/2016   Procedure: CYSTOSCOPY;  Surgeon: Royston Cowper, MD;  Location: [facility];  Service: Urology;  Laterality: N/A;   CYSTOSCOPY  WITH HYDRODISTENSION AND BIOPSY     ESOPHAGOGASTRODUODENOSCOPY     LAPAROSCOPIC ASSISTED VAGINAL HYSTERECTOMY N/A 08/20/2016   Procedure: LAPAROSCOPIC ASSISTED VAGINAL HYSTERECTOMY;  Surgeon: Boykin Nearing, MD;  Location: [facility];  Service: Gynecology;  Laterality: N/A;   LAPAROSCOPIC BILATERAL SALPINGECTOMY Bilateral 08/20/2016   Procedure: LAPAROSCOPIC BILATERAL SALPINGECTOMY;  Surgeon: Boykin Nearing, MD;   Location: [facility];  Service: Gynecology;  Laterality: Bilateral;   NASAL SINUS SURGERY     x12   REDUCTION MAMMAPLASTY Bilateral 06/11/2021   TONSILLECTOMY     TUBAL LIGATION  2003    Allergies: Allergies as of 11/13/2022 - Review Complete 11/13/2022  Allergen Reaction Noted   Bee pollen Anaphylaxis 04/17/2016   Bee venom Anaphylaxis 04/17/2016   Eggs or egg-derived products Anaphylaxis 08/06/2015   Keflex [cephalexin] Anaphylaxis 08/06/2015   Milk (cow) Anaphylaxis    Milk-related compounds Anaphylaxis 08/06/2015   Penicillins Anaphylaxis 08/06/2015   Red dye Shortness Of Breath 06/17/2018   Shellfish allergy Anaphylaxis and Other (See Comments) 04/17/2016   Soy allergy Anaphylaxis 08/06/2015   Tilactase Anaphylaxis and Other (See Comments) 04/17/2016   Topamax [topiramate]  08/29/2020   Wheat bran Anaphylaxis 04/30/2016   Yellow dyes (non-tartrazine) Shortness Of Breath 01/30/2022   Ferrous gluconate Diarrhea and Nausea And Vomiting 08/05/2013   Gabapentin Other (See Comments) 11/03/2019   Hydrocodone Hives 04/17/2016   Iron Diarrhea 04/17/2016   Butalbital-apap-caffeine Other (See Comments) 02/04/2019   Codeine Rash 04/17/2016   Iodine Rash 08/20/2016   Latex Itching and Rash 04/17/2016   Meclizine Other (See Comments) 08/29/2020   Robaxin [methocarbamol] Other (See Comments) 02/09/2022    Medications: Outpatient Encounter Medications as of 11/13/2022  Medication Sig   albuterol (PROVENTIL HFA;VENTOLIN HFA) 108 (90 Base) MCG/ACT inhaler Inhale 2 puffs into the lungs every 6 (six) hours as needed for wheezing or shortness of breath.   ALPRAZolam (XANAX) 0.5 MG tablet Take 1 tablet (0.5 mg total) by mouth as directed. Take it only once a day as needed for severe panic symptoms   azelastine (OPTIVAR) 0.05 % ophthalmic solution Place 1 drop into both eyes daily as needed (matted eyes in the morning).   Calcipotriene-Betameth Diprop (WYNZORA) 0.005-0.064 % CREA Apply 1  application. topically in the morning and at bedtime.   cetirizine (ZYRTEC) 10 MG tablet Take 20 mg by mouth 2 (two) times daily.   COSENTYX SENSOREADY PEN 150 MG/ML SOAJ SMARTSIG:SUB-Q   CRANBERRY PO Take 1 capsule by mouth at bedtime.    desvenlafaxine (PRISTIQ) 100 MG 24 hr tablet Take 1 tablet (100 mg total) by mouth daily.   EPINEPHrine 0.3 mg/0.3 mL IJ SOAJ injection Inject 0.3 mLs (0.3 mg total) into the muscle once.   hydrochlorothiazide (HYDRODIURIL) 25 MG tablet Take 25 mg by mouth daily.   levocetirizine (XYZAL) 5 MG tablet Take 10 mg by mouth 2 (two) times daily.   levothyroxine (SYNTHROID, LEVOTHROID) 50 MCG tablet Take 50-100 mcg by mouth See admin instructions. Take 100 mcg by mouth every other day, alternating with 50 mcg on alternate days.   montelukast (SINGULAIR) 10 MG tablet Take 10 mg by mouth at bedtime.    MULTIPLE VITAMIN PO Take 1 tablet by mouth daily.   nitrofurantoin, macrocrystal-monohydrate, (MACROBID) 100 MG capsule Take 100 mg by mouth daily as needed (after being in water).   omalizumab (XOLAIR) 150 MG injection Inject 300 mg into the skin every 21 ( twenty-one) days.   ondansetron (ZOFRAN-ODT) 4 MG disintegrating tablet Take 1 tablet (4  mg total) by mouth every 8 (eight) hours as needed for nausea or vomiting.   polyethylene glycol (MIRALAX) 17 g packet Take 17 g by mouth daily. (Patient taking differently: Take 17 g by mouth 4 (four) times a week.)   Semaglutide, 2 MG/DOSE, (OZEMPIC, 2 MG/DOSE,) 8 MG/3ML SOPN Inject 2 mg subcutaneously once a week   vitamin B-12 (CYANOCOBALAMIN) 1000 MCG tablet Take 1,000 mcg by mouth daily.   vitamin C (ASCORBIC ACID) 500 MG tablet Take 500 mg by mouth at bedtime.   No facility-administered encounter medications on file as of 11/13/2022.    Social History: Social History   Tobacco Use   Smoking status: Former    Types: Cigarettes    Quit date: 11/12/2006    Years since quitting: 16.0   Smokeless tobacco: Never   Tobacco  comments:    quit 2008  Vaping Use   Vaping Use: Never used  Substance Use Topics   Alcohol use: No    Alcohol/week: 0.0 standard drinks of alcohol   Drug use: No    Family Medical History: Family History  Problem Relation Age of Onset   Breast cancer Maternal Aunt    Breast cancer Paternal Aunt    Breast cancer Maternal Grandmother    Breast cancer Paternal Grandmother    Breast cancer Paternal Aunt    Bipolar disorder Paternal Aunt    Kidney cancer Maternal Uncle    Prostate cancer Paternal Grandfather    Kidney disease Other        father's side   Aneurysm Mother    Liver disease Mother    Drug abuse Mother    Alcohol abuse Mother    Anxiety disorder Mother    Depression Mother    Cancer Father    Anxiety disorder Father    Depression Father     Physical Examination: Vitals:   11/13/22 0924  BP: (!) 148/90  Pulse: 72  Temp: 98.5 F (36.9 C)    General: Patient is well developed, well nourished, calm, collected, and in no apparent distress. Attention to examination is appropriate.  Respiratory: Patient is breathing without any difficulty.   NEUROLOGICAL:     Awake, alert, oriented to person, place, and time.  Speech is clear and fluent. Fund of knowledge is appropriate.   Cranial Nerves: Pupils equal round and reactive to light.  Facial tone is symmetric.  Facial sensation is symmetric.    Strength: Side Biceps Triceps Deltoid Interossei Grip Wrist Ext. Wrist Flex.  R 4+ 5 4+ 5 4+ 5 5  L '5 5 5 5 5 5 5   '$ Side Iliopsoas Quads Hamstring PF DF EHL  R '5 5 5 5 5 5  '$ L '5 5 5 5 5 5   '$ Reflexes are 2+ and symmetric at the biceps, triceps, brachioradialis, patella and achilles.   Hoffman's is absent.  Clonus is not present.   Bilateral upper and lower extremity sensation is diminished diffusely per patient. Worse in lower extremities.   Medical Decision Making  Imaging: Lumbar MRI 02/05/22:  FINDINGS: Segmentation: Transitional lumbosacral anatomy. For  the purposes of this dictation, L5 is partially sacralized.   Alignment:  No substantial sagittal subluxation.   Vertebrae: Vertebral body heights are maintained. No focal marrow edema to suggest acute fracture discitis/osteomyelitis. No suspicious bone lesion.   Conus medullaris and cauda equina: Conus extends to the L1-L2 level. Conus appears normal.   Paraspinal and other soft tissues: Unremarkable.   Disc levels:  T12-L1: No significant disc protrusion, foraminal stenosis, or canal stenosis.   L1-L2: No significant disc protrusion, foraminal stenosis, or canal stenosis.   L2-L3: Mild facet arthropathy. No significant disc protrusion, foraminal stenosis, or canal stenosis.   L3-L4: Mild facet arthropathy and slight disc bulging. Mild bilateral foraminal stenosis. No significant canal stenosis.   L4-L5: Mild facet arthropathy and slight disc bulging. No significant canal or foraminal stenosis.   L5-S1: Transitional anatomy. No significant canal or foraminal stenosis.   IMPRESSION: 1. Transitional lumbosacral anatomy. For the purposes of this dictation, L5 is partially sacralized. Correlation with radiographs is recommended prior to any operative intervention. 2. Lower lumbar facet arthropathy with mild bilateral foraminal stenosis at L3-L4. No significant canal stenosis.     Electronically Signed   By: Margaretha Sheffield M.D.   On: 02/06/2022 13:20  Cervical spine x-rays from today show no evidence of complication.  I have personally reviewed the images and agree with the above interpretation.  Assessment and Plan: Kristine [name] is a pleasant 52 y.o. female with history of cervical myelopathy status post decompression fusion.  She is doing well from that.  She is having significant issues with migraine headaches.  She sees Dr. Manuella Ghazi for that.  I will reach out to him.  She also had a recent MRI scan which I think she may benefit from reviewing in person.  She has a  partially empty sella turcica.  She has some possible evidence of prior small strokes.    I will see her back on an as-needed basis.  Kristine Maw MD Dept. of Neurosurgery

## 2022-12-03 ENCOUNTER — Other Ambulatory Visit: Payer: Self-pay | Admitting: Student

## 2022-12-03 DIAGNOSIS — G43719 Chronic migraine without aura, intractable, without status migrainosus: Secondary | ICD-10-CM

## 2022-12-12 ENCOUNTER — Ambulatory Visit
Admission: RE | Admit: 2022-12-12 | Discharge: 2022-12-12 | Disposition: A | Discharge status: Home / Self Care | Payer: BC Managed Care – PPO | Source: Ambulatory Visit | Attending: Student | Admitting: Student

## 2022-12-12 ENCOUNTER — Ambulatory Visit: Payer: BC Managed Care – PPO | Admitting: Dermatology

## 2022-12-12 DIAGNOSIS — G43719 Chronic migraine without aura, intractable, without status migrainosus: Secondary | ICD-10-CM | POA: Diagnosis not present

## 2023-01-15 NOTE — Progress Notes (Unsigned)
Referring Physician:  No referring provider defined for this encounter.  Primary Physician:  Gladstone Lighter, MD  History of Present Illness:  DOS: 02/09/22 C5-6 ACDF for cervical myelopathy  She has a history of psoriatic arthritis, migraines.   Last seen by Dr. Izora Ribas on 11/13/22 and she was doing well. He advised her to follow up with Dr. Manuella Ghazi for her migraines.   She has known neuropathy in her legs and both feet are numb. Previous lumbar MRI showed some facet hypertrophy and lumbar spondylosis, however I thought most of her pain was from her SI joint. We discussed PT and she declined.   She is here for follow up.   Urinary incontinence?*** Per my last note, she has frequency of her bowels/bladder and sometimes does not make it to the bathroom in time. This is chronic.    Last seen by Dr. Izora Ribas on 05/08/22 and she was improving. She was in therapy for her balance.   She has constant LBP with no leg pain. Not sure how long she has been hurting. Left side is worse than right side. She has known neuropathy in her legs and both feet are numb. No specific alleviating/aggravating factors. She feels like she is walking "bent over." She does not feel like her balance is any better.   She has frequency of her bowels/bladder and sometimes does not make it to the bathroom in time. This is chronic.    Conservative measures:  Physical therapy: doing PT for balance *** Multimodal medical therapy including regular antiinflammatories: none  Injections: No epidural steroid injections  Past Surgery: ACDF C5-C6 02/09/22.    Review of Systems:  A 10 point review of systems is negative, except for the pertinent positives and negatives detailed in the HPI.  Past Medical History: Past Medical History:  Diagnosis Date   Abnormal weight gain 08/05/2013   Absence of interventricular septum 08/05/2013   Allergic rhinitis 04/17/2016   Allergic urticaria due to ingested food 02/09/2016    Anxiety    Asthma    Cancer ([place])    melanoma-left eye and right hand   Clinical depression [id number]   Complication of anesthesia    PT HAD TROUBLE BREATHING AFTER HYSTERECTOMY IN OCTOBER   Depression    Dizziness    Excess weight 09/30/2013   GERD (gastroesophageal reflux disease)    Headache, migraine 09/30/2013   Heart murmur    Heavy drinker 04/11/2014   Overview:  Patient reports drinking 2-3 bottles of wine each weekend.     History of methicillin resistant staphylococcus aureus (MRSA) 11/2015   Hypothyroidism    Infection of urinary tract 09/30/2013   Irregular bleeding 09/30/2013   Multiple allergies    Pneumonia    Pre-diabetes    VSD (ventricular septal defect and aortic arch hypoplasia     Past Surgical History: Past Surgical History:  Procedure Laterality Date   ANTERIOR CERVICAL DECOMP/DISCECTOMY FUSION N/A 02/09/2022   Procedure: C5-6 ANTERIOR CERVICAL DISCECTOMY AND FUSION (GLOBUS HEDRON);  Surgeon: Meade Maw, MD;  Location: [facility];  Service: Neurosurgery;  Laterality: N/A;   BLEPHAROPLASTY  2019   BOTOX INJECTION N/A 10/23/2016   Procedure: BOTOX INJECTION;  Surgeon: Royston Cowper, MD;  Location: [facility];  Service: Urology;  Laterality: N/A;   BREAST BIOPSY Left 11/13/2013   benign   CESAREAN SECTION     x2   COLONOSCOPY  2006   COLONOSCOPY WITH PROPOFOL N/A 07/04/2020   Procedure: COLONOSCOPY WITH PROPOFOL;  Surgeon:  Lesly Rubenstein, MD;  Location: [facility];  Service: Endoscopy;  Laterality: N/A;   COMBINED REDUCTION MAMMAPLASTY W/ ABDOMINOPLASTY Bilateral 05/2021   CORONARY ANGIOPLASTY  2013   COSMETIC SURGERY  06/09/2021   tummy tuck   CYSTOSCOPY N/A 10/23/2016   Procedure: CYSTOSCOPY;  Surgeon: Royston Cowper, MD;  Location: [facility];  Service: Urology;  Laterality: N/A;   CYSTOSCOPY WITH HYDRODISTENSION AND BIOPSY     ESOPHAGOGASTRODUODENOSCOPY     LAPAROSCOPIC ASSISTED VAGINAL HYSTERECTOMY N/A 08/20/2016   Procedure:  LAPAROSCOPIC ASSISTED VAGINAL HYSTERECTOMY;  Surgeon: Boykin Nearing, MD;  Location: [facility];  Service: Gynecology;  Laterality: N/A;   LAPAROSCOPIC BILATERAL SALPINGECTOMY Bilateral 08/20/2016   Procedure: LAPAROSCOPIC BILATERAL SALPINGECTOMY;  Surgeon: Boykin Nearing, MD;  Location: [facility];  Service: Gynecology;  Laterality: Bilateral;   NASAL SINUS SURGERY     x12   REDUCTION MAMMAPLASTY Bilateral 06/11/2021   TONSILLECTOMY     TUBAL LIGATION  2003    Allergies: Allergies as of 01/17/2023 - Review Complete 11/13/2022  Allergen Reaction Noted   Bee pollen Anaphylaxis 04/17/2016   Bee venom Anaphylaxis 04/17/2016   Eggs or egg-derived products Anaphylaxis 08/06/2015   Keflex [cephalexin] Anaphylaxis 08/06/2015   Milk (cow) Anaphylaxis    Milk-related compounds Anaphylaxis 08/06/2015   Penicillins Anaphylaxis 08/06/2015   Red dye Shortness Of Breath 06/17/2018   Shellfish allergy Anaphylaxis and Other (See Comments) 04/17/2016   Soy allergy Anaphylaxis 08/06/2015   Tilactase Anaphylaxis and Other (See Comments) 04/17/2016   Topamax [topiramate]  08/29/2020   Wheat bran Anaphylaxis 04/30/2016   Yellow dyes (non-tartrazine) Shortness Of Breath 01/30/2022   Ferrous gluconate Diarrhea and Nausea And Vomiting 08/05/2013   Gabapentin Other (See Comments) 11/03/2019   Hydrocodone Hives 04/17/2016   Iron Diarrhea 04/17/2016   Butalbital-apap-caffeine Other (See Comments) 02/04/2019   Codeine Rash 04/17/2016   Iodine Rash 08/20/2016   Latex Itching and Rash 04/17/2016   Meclizine Other (See Comments) 08/29/2020   Robaxin [methocarbamol] Other (See Comments) 02/09/2022    Medications: Outpatient Encounter Medications as of 01/17/2023  Medication Sig   albuterol (PROVENTIL HFA;VENTOLIN HFA) 108 (90 Base) MCG/ACT inhaler Inhale 2 puffs into the lungs every 6 (six) hours as needed for wheezing or shortness of breath.   ALPRAZolam (XANAX) 0.5 MG tablet Take 1 tablet (0.5  mg total) by mouth as directed. Take it only once a day as needed for severe panic symptoms   azelastine (OPTIVAR) 0.05 % ophthalmic solution Place 1 drop into both eyes daily as needed (matted eyes in the morning).   Calcipotriene-Betameth Diprop (WYNZORA) 0.005-0.064 % CREA Apply 1 application. topically in the morning and at bedtime.   cetirizine (ZYRTEC) 10 MG tablet Take 20 mg by mouth 2 (two) times daily.   COSENTYX SENSOREADY PEN 150 MG/ML SOAJ SMARTSIG:SUB-Q   CRANBERRY PO Take 1 capsule by mouth at bedtime.    desvenlafaxine (PRISTIQ) 100 MG 24 hr tablet Take 1 tablet (100 mg total) by mouth daily.   EPINEPHrine 0.3 mg/0.3 mL IJ SOAJ injection Inject 0.3 mLs (0.3 mg total) into the muscle once.   hydrochlorothiazide (HYDRODIURIL) 25 MG tablet Take 25 mg by mouth daily.   levocetirizine (XYZAL) 5 MG tablet Take 10 mg by mouth 2 (two) times daily.   levothyroxine (SYNTHROID, LEVOTHROID) 50 MCG tablet Take 50-100 mcg by mouth See admin instructions. Take 100 mcg by mouth every other day, alternating with 50 mcg on alternate days.   montelukast (SINGULAIR) 10 MG tablet Take 10  mg by mouth at bedtime.    MULTIPLE VITAMIN PO Take 1 tablet by mouth daily.   nitrofurantoin, macrocrystal-monohydrate, (MACROBID) 100 MG capsule Take 100 mg by mouth daily as needed (after being in water).   omalizumab (XOLAIR) 150 MG injection Inject 300 mg into the skin every 21 ( twenty-one) days.   ondansetron (ZOFRAN-ODT) 4 MG disintegrating tablet Take 1 tablet (4 mg total) by mouth every 8 (eight) hours as needed for nausea or vomiting.   polyethylene glycol (MIRALAX) 17 g packet Take 17 g by mouth daily. (Patient taking differently: Take 17 g by mouth 4 (four) times a week.)   Semaglutide, 2 MG/DOSE, (OZEMPIC, 2 MG/DOSE,) 8 MG/3ML SOPN Inject 2 mg subcutaneously once a week   vitamin B-12 (CYANOCOBALAMIN) 1000 MCG tablet Take 1,000 mcg by mouth daily.   vitamin C (ASCORBIC ACID) 500 MG tablet Take 500 mg by  mouth at bedtime.   No facility-administered encounter medications on file as of 01/17/2023.    Social History: Social History   Tobacco Use   Smoking status: Former    Types: Cigarettes    Quit date: 11/12/2006    Years since quitting: 16.1   Smokeless tobacco: Never   Tobacco comments:    quit 2008  Vaping Use   Vaping Use: Never used  Substance Use Topics   Alcohol use: No    Alcohol/week: 0.0 standard drinks of alcohol   Drug use: No    Family Medical History: Family History  Problem Relation Age of Onset   Breast cancer Maternal Aunt    Breast cancer Paternal Aunt    Breast cancer Maternal Grandmother    Breast cancer Paternal Grandmother    Breast cancer Paternal Aunt    Bipolar disorder Paternal Aunt    Kidney cancer Maternal Uncle    Prostate cancer Paternal Grandfather    Kidney disease Other        father's side   Aneurysm Mother    Liver disease Mother    Drug abuse Mother    Alcohol abuse Mother    Anxiety disorder Mother    Depression Mother    Cancer Father    Anxiety disorder Father    Depression Father     Physical Examination: There were no vitals filed for this visit.  General: Patient is well developed, well nourished, calm, collected, and in no apparent distress. Attention to examination is appropriate.  Respiratory: Patient is breathing without any difficulty.   NEUROLOGICAL:     Awake, alert, oriented to person, place, and time.  Speech is clear and fluent. Fund of knowledge is appropriate.   Cranial Nerves: Pupils equal round and reactive to light.  Facial tone is symmetric.  Facial sensation is symmetric.  Limited ROM of lumbar spine with no pain  No abnormal lesions on exposed skin.   Strength: Side Biceps Triceps Deltoid Interossei Grip Wrist Ext. Wrist Flex.  R '4 4 4 5 4 5 5  '$ L '5 5 5 5 5 5 5   '$ Side Iliopsoas Quads Hamstring PF DF EHL  R '5 5 5 5 5 5  '$ L '5 5 5 5 5 5   '$ Reflexes are 2+ and symmetric at the biceps, triceps,  brachioradialis, patella and achilles.   Hoffman's is absent.  Clonus is not present.   Bilateral upper and lower extremity sensation is diminished diffusely per patient. Worse in lower extremities.   She has point tenderness over left > right SI joint.   Gait  is slow.   Medical Decision Making  Imaging: None   Assessment and Plan: Ms. Faso is a pleasant 52 y.o. female with constant LBP with no leg pain. Not sure how long she has been hurting. Left > right side. She has known neuropathy in her legs and both feet are numb.   Above lumbar MRI shows some facet hypertrophy and lumbar spondylosis, however I think most of her pain is from her SI joint. Also with increased pain in wrists and shoulders- may be flare of psoriatic arthritis.   Above treatment options discussed with patient and following plan made:   - Message sent to Dr. Posey Pronto with rheumatology at her request.  - Discussed possible PT for SI joint pain. She wants to hold off.  - Balance issues are multifactorial. She has neuropathy in both legs and feet are numb. She has not seen much improvement since cervical surgery. Will continue to follow.  - Keep follow up as scheduled with Dr. Izora Ribas in January for her cervical spine.   Thank you for involving me in the care of this patient.   Geronimo Boot PA-C Dept. of Neurosurgery

## 2023-01-17 ENCOUNTER — Encounter: Payer: Self-pay | Admitting: Orthopedic Surgery

## 2023-01-17 ENCOUNTER — Ambulatory Visit (INDEPENDENT_AMBULATORY_CARE_PROVIDER_SITE_OTHER): Payer: BC Managed Care – PPO | Admitting: Orthopedic Surgery

## 2023-01-17 VITALS — BP 124/80 | Ht 62.0 in | Wt 174.6 lb

## 2023-01-17 DIAGNOSIS — M47816 Spondylosis without myelopathy or radiculopathy, lumbar region: Secondary | ICD-10-CM

## 2023-01-17 DIAGNOSIS — M5416 Radiculopathy, lumbar region: Secondary | ICD-10-CM

## 2023-01-17 DIAGNOSIS — M4726 Other spondylosis with radiculopathy, lumbar region: Secondary | ICD-10-CM

## 2023-01-17 NOTE — Patient Instructions (Signed)
It was so nice to see you today. Thank you so much for coming in.    You have wear and tear in your back (arthritis) and this is likely what is causing your pain.   Take the prednisone as prescribed by rheumatology. I think it will help.   I want you to see pain management (Dr. Holley Raring) to discuss possible lumbar injections. They should call you to schedule an appointment or you can call them at [phone].   I will see you back in 6 weeks. Please do not hesitate to call if you have any questions or concerns. You can also message me in [place].   Geronimo Boot PA-C [phone]

## 2023-02-01 ENCOUNTER — Encounter: Payer: Self-pay | Admitting: Student in an Organized Health Care Education/Training Program

## 2023-02-01 ENCOUNTER — Telehealth: Payer: Self-pay | Admitting: Neurosurgery

## 2023-02-01 NOTE — Telephone Encounter (Signed)
Pt called today and said that her appt for Pain Management was not until April 16 but that her pain was getting worse. She called [facility] (local to pt) and they can get her in sooner for Pain Management and asked if we could send the referral there instead.  [facility]  [address] [place], [place] [address] Phone:  [phone] Fax: [phone]

## 2023-02-01 NOTE — Telephone Encounter (Signed)
Patient notified referral was faxed.

## 2023-02-05 ENCOUNTER — Encounter: Payer: Self-pay | Admitting: Student in an Organized Health Care Education/Training Program

## 2023-02-05 ENCOUNTER — Ambulatory Visit
Payer: BC Managed Care – PPO | Attending: Student in an Organized Health Care Education/Training Program | Admitting: Student in an Organized Health Care Education/Training Program

## 2023-02-05 ENCOUNTER — Other Ambulatory Visit: Payer: Self-pay

## 2023-02-05 VITALS — BP 126/79 | HR 87 | Temp 97.4°F | Resp 18 | Ht 62.5 in | Wt 170.0 lb

## 2023-02-05 DIAGNOSIS — G894 Chronic pain syndrome: Secondary | ICD-10-CM | POA: Insufficient documentation

## 2023-02-05 DIAGNOSIS — M47816 Spondylosis without myelopathy or radiculopathy, lumbar region: Secondary | ICD-10-CM | POA: Insufficient documentation

## 2023-02-05 DIAGNOSIS — M48061 Spinal stenosis, lumbar region without neurogenic claudication: Secondary | ICD-10-CM | POA: Insufficient documentation

## 2023-02-05 NOTE — Patient Instructions (Signed)
____________________________________________________________________________________________  General Risks and Possible Complications  Patient Responsibilities: It is important that you read this as it is part of your informed consent. It is our duty to inform you of the risks and possible complications associated with treatments offered to you. It is your responsibility as a patient to read this and to ask questions about anything that is not clear or that you believe was not covered in this document.  Patient's Rights: You have the right to refuse treatment. You also have the right to change your mind, even after initially having agreed to have the treatment done. However, under this last option, if you wait until the last second to change your mind, you may be charged for the materials used up to that point.  Introduction: Medicine is not an exact science. Everything in Medicine, including the lack of treatment(s), carries the potential for danger, harm, or loss (which is by definition: Risk). In Medicine, a complication is a secondary problem, condition, or disease that can aggravate an already existing one. All treatments carry the risk of possible complications. The fact that a side effects or complications occurs, does not imply that the treatment was conducted incorrectly. It must be clearly understood that these can happen even when everything is done following the highest safety standards.  No treatment: You can choose not to proceed with the proposed treatment alternative. The "PRO(s)" would include: avoiding the risk of complications associated with the therapy. The "CON(s)" would include: not getting any of the treatment benefits. These benefits fall under one of three categories: diagnostic; therapeutic; and/or palliative. Diagnostic benefits include: getting information which can ultimately lead to improvement of the disease or symptom(s). Therapeutic benefits are those associated with  the successful treatment of the disease. Finally, palliative benefits are those related to the decrease of the primary symptoms, without necessarily curing the condition (example: decreasing the pain from a flare-up of a chronic condition, such as incurable terminal cancer).  General Risks and Complications: These are associated to most interventional treatments. They can occur alone, or in combination. They fall under one of the following six (6) categories: no benefit or worsening of symptoms; bleeding; infection; nerve damage; allergic reactions; and/or death. No benefits or worsening of symptoms: In Medicine there are no guarantees, only probabilities. No healthcare provider can ever guarantee that a medical treatment will work, they can only state the probability that it may. Furthermore, there is always the possibility that the condition may worsen, either directly, or indirectly, as a consequence of the treatment. Bleeding: This is more common if the patient is taking a blood thinner, either prescription or over the counter (example: Goody Powders, Fish oil, Aspirin, Garlic, etc.), or if suffering a condition associated with impaired coagulation (example: Hemophilia, cirrhosis of the liver, low platelet counts, etc.). However, even if you do not have one on these, it can still happen. If you have any of these conditions, or take one of these drugs, make sure to notify your treating physician. Infection: This is more common in patients with a compromised immune system, either due to disease (example: diabetes, cancer, human immunodeficiency virus [HIV], etc.), or due to medications or treatments (example: therapies used to treat cancer and rheumatological diseases). However, even if you do not have one on these, it can still happen. If you have any of these conditions, or take one of these drugs, make sure to notify your treating physician. Nerve Damage: This is more common when the treatment is an    invasive one, but it can also happen with the use of medications, such as those used in the treatment of cancer. The damage can occur to small secondary nerves, or to large primary ones, such as those in the spinal cord and brain. This damage may be temporary or permanent and it may lead to impairments that can range from temporary numbness to permanent paralysis and/or brain death. Allergic Reactions: Any time a substance or material comes in contact with our body, there is the possibility of an allergic reaction. These can range from a mild skin rash (contact dermatitis) to a severe systemic reaction (anaphylactic reaction), which can result in death. Death: In general, any medical intervention can result in death, most of the time due to an unforeseen complication. ____________________________________________________________________________________________    ______________________________________________________________________  Preparing for your procedure  Appointments: If you think you may not be able to keep your appointment, call 24-48 hours in advance to cancel. We need time to make it available to others.  During your procedure appointment there will be: No Prescription Refills. No disability issues to discussed. No medication changes or discussions.  Instructions: Food intake: Avoid eating anything solid for at least 8 hours prior to your procedure. Clear liquid intake: You may take clear liquids such as water up to 2 hours prior to your procedure. (No carbonated drinks. No soda.) Transportation: Unless otherwise stated by your physician, bring a driver. Morning Medicines: Except for blood thinners, take all of your other morning medications with a sip of water. Make sure to take your heart and blood pressure medicines. If your blood pressure's lower number is above 100, the case will be rescheduled. Blood thinners: Make sure to stop your blood thinners as instructed.  If you take  a blood thinner, but were not instructed to stop it, call our office [phone] and ask to talk to a nurse. Not stopping a blood thinner prior to certain procedures could lead to serious complications. Diabetics on insulin: Notify the staff so that you can be scheduled 1st case in the morning. If your diabetes requires high dose insulin, take only  of your normal insulin dose the morning of the procedure and notify the staff that you have done so. Preventing infections: Shower with an antibacterial soap the morning of your procedure.  Build-up your immune system: Take 1000 mg of Vitamin C with every meal (3 times a day) the day prior to your procedure. Antibiotics: Inform the nursing staff if you are taking any antibiotics or if you have any conditions that may require antibiotics prior to procedures. (Example: recent joint implants)   Pregnancy: If you are pregnant make sure to notify the nursing staff. Not doing so may result in injury to the fetus, including death.  Sickness: If you have a cold, fever, or any active infections, call and cancel or reschedule your procedure. Receiving steroids while having an infection may result in complications. Arrival: You must be in the facility at least 30 minutes prior to your scheduled procedure. Tardiness: Your scheduled time is also the cutoff time. If you do not arrive at least 15 minutes prior to your procedure, you will be rescheduled.  Children: Do not bring any children with you. Make arrangements to keep them home. Dress appropriately: There is always a possibility that your clothing may get soiled. Avoid long dresses. Valuables: Do not bring any jewelry or valuables.  Reasons to call and reschedule or cancel your procedure: (Following these recommendations will minimize the risk  of a serious complication.) Surgeries: Avoid having procedures within 2 weeks of any surgery. (Avoid for 2 weeks before or after any surgery). Flu Shots: Avoid having  procedures within 2 weeks of a flu shots or . (Avoid for 2 weeks before or after immunizations). Barium: Avoid having a procedure within 7-10 days after having had a radiological study involving the use of radiological contrast. (Myelograms, Barium swallow or enema study). Heart attacks: Avoid any elective procedures or surgeries for the initial 6 months after a "Myocardial Infarction" (Heart Attack). Blood thinners: It is imperative that you stop these medications before procedures. Let us know if you if you take any blood thinner.  Infection: Avoid procedures during or within two weeks of an infection (including chest colds or gastrointestinal problems). Symptoms associated with infections include: Localized redness, fever, chills, night sweats or profuse sweating, burning sensation when voiding, cough, congestion, stuffiness, runny nose, sore throat, diarrhea, nausea, vomiting, cold or Flu symptoms, recent or current infections. It is specially important if the infection is over the area that we intend to treat. Heart and lung problems: Symptoms that may suggest an active cardiopulmonary problem include: cough, chest pain, breathing difficulties or shortness of breath, dizziness, ankle swelling, uncontrolled high or unusually low blood pressure, and/or palpitations. If you are experiencing any of these symptoms, cancel your procedure and contact your primary care physician for an evaluation.  Remember:  Regular Business hours are:  Monday to Thursday 8:00 AM to 4:00 PM  Provider's Schedule: Milinda Pointer, MD:  Procedure days: Tuesday and Thursday 7:30 AM to 4:00 PM  Gillis Santa, MD:  Procedure days: Monday and Wednesday 7:30 AM to 4:00 PM  ______________________________________________________________________   Facet Blocks Patient Information  Description: The facets are joints in the spine between the vertebrae.  Like any joints in the body, facets can become irritated and painful.   Arthritis can also effect the facets.  By injecting steroids and local anesthetic in and around these joints, we can temporarily block the nerve supply to them.  Steroids act directly on irritated nerves and tissues to reduce selling and inflammation which often leads to decreased pain.  Facet blocks may be done anywhere along the spine from the neck to the low back depending upon the location of your pain.   After numbing the skin with local anesthetic (like Novocaine), a small needle is passed onto the facet joints under x-ray guidance.  You may experience a sensation of pressure while this is being done.  The entire block usually lasts about 15-25 minutes.   Conditions which may be treated by facet blocks:  Low back/buttock pain Neck/shoulder pain Certain types of headaches  Preparation for the injection:  Do not eat any solid food or dairy products within 8 hours of your appointment. You may drink clear liquid up to 3 hours before appointment.  Clear liquids include water, black coffee, juice or soda.  No milk or cream please. You may take your regular medication, including pain medications, with a sip of water before your appointment.  Diabetics should hold regular insulin (if taken separately) and take 1/2 normal NPH dose the morning of the procedure.  Carry some sugar containing items with you to your appointment. A driver must accompany you and be prepared to drive you home after your procedure. Bring all your current medications with you. An IV may be inserted and sedation may be given at the discretion of the physician. A blood pressure cuff, EKG and other monitors will  often be applied during the procedure.  Some patients may need to have extra oxygen administered for a short period. You will be asked to provide medical information, including your allergies and medications, prior to the procedure.  We must know immediately if you are taking blood thinners (like Coumadin/Warfarin) or if  you are allergic to IV iodine contrast (dye).  We must know if you could possible be pregnant.  Possible side-effects:  Bleeding from needle site Infection (rare, may require surgery) Nerve injury (rare) Numbness & tingling (temporary) Difficulty urinating (rare, temporary) Spinal headache (a headache worse with upright posture) Light-headedness (temporary) Pain at injection site (serveral days) Decreased blood pressure (rare, temporary) Weakness in arm/leg (temporary) Pressure sensation in back/neck (temporary)   Call if you experience:  Fever/chills associated with headache or increased back/neck pain Headache worsened by an upright position New onset, weakness or numbness of an extremity below the injection site Hives or difficulty breathing (go to the emergency room) Inflammation or drainage at the injection site(s) Severe back/neck pain greater than usual New symptoms which are concerning to you  Please note:  Although the local anesthetic injected can often make your back or neck feel good for several hours after the injection, the pain will likely return. It takes 3-7 days for steroids to work.  You may not notice any pain relief for at least one week.  If effective, we will often do a series of 2-3 injections spaced 3-6 weeks apart to maximally decrease your pain.  After the initial series, you may be a candidate for a more permanent nerve block of the facets.  If you have any questions, please call #[phone]) [facility]

## 2023-02-05 NOTE — Progress Notes (Signed)
PROVIDER NOTE: Information contained herein reflects review and annotations entered in association with encounter. Interpretation of such information and data should be left to medically-trained personnel. Information provided to patient can be located elsewhere in the medical record under "Patient Instructions". Document created using STT-dictation technology, any transcriptional errors that may result from process are unintentional.    Patient: Kristine [name]  Service Category: E/M  Provider: Gillis Santa, MD  DOB: [birth year]  DOS: 02/05/2023  Referring Provider: Jola Babinski  MRN: [id number]  Specialty: Interventional Pain Management  PCP: Gladstone Lighter, MD  Type: Established Patient  Setting: Ambulatory outpatient    Location: Office  Delivery: Face-to-face     HPI  Kristine [name], a 52 y.o. year old female, is here today because of her Lumbar facet arthropathy [M47.816]. Kristine [name] primary complain today is Back Pain (Low and center)  Pertinent problems: Kristine [name] does not have any pertinent problems on file. Pain Assessment: Severity of Chronic pain is reported as a 10-Worst pain ever/10. Location: Back Lower/occasionally left leg laterally through to foot. Onset: More than a month ago. Quality: Other (Comment), Spasm, Tender (pulling sensation). Timing: Constant. Modifying factor(s): heat, ice, tylenol, rest. Vitals:  height is 5' 2.5" (1.588 m) and weight is 170 lb (77.1 kg). Her temporal temperature is 97.4 F (36.3 C) (abnormal). Her blood pressure is 126/79 and her pulse is 87. Her respiration is 18 and oxygen saturation is 97%.  BMI: Estimated body mass index is 30.6 kg/m as calculated from the following:   Height as of this encounter: 5' 2.5" (1.588 m).   Weight as of this encounter: 170 lb (77.1 kg).   Reason for encounter: new problems.  I have previously been treating patient with C-ESI for cervical radiculopathy, previously done 12/18/21,  s/p c-fusion  3/23. Increased low back pain with occasional radiation into left leg in a dermatomal fashion States that lower back pain is more severe and frequent than the radiating left leg pain Also endorsing urinary incontinence associated with episodes of severe pain Scheduled for right knee replacement 02/2023    ROS  Constitutional: Denies any fever or chills Gastrointestinal: No reported hemesis, hematochezia, vomiting, or acute GI distress Musculoskeletal:  +LBP with occasional radiation into left leg Neurological: No reported episodes of acute onset apraxia, aphasia, dysarthria, agnosia, amnesia, paralysis, loss of coordination, or loss of consciousness  Medication Review  ALPRAZolam, Adalimumab, Calcipotriene-Betameth Diprop, Cranberry, EPINEPHrine, Multiple Vitamin, Secukinumab, Semaglutide (2 MG/DOSE), Ubrogepant, albuterol, ascorbic acid, azelastine, cetirizine, cyanocobalamin, desvenlafaxine, divalproex, hydrochlorothiazide, levocetirizine, levothyroxine, montelukast, nitrofurantoin (macrocrystal-monohydrate), omalizumab, ondansetron, and polyethylene glycol  History Review  Allergy: Kristine [name] is allergic to bee pollen, bee venom, egg-derived products, keflex [cephalexin], milk (cow), milk-related compounds, penicillins, red dye, shellfish allergy, soy allergy, tilactase, topamax [topiramate], wheat, yellow dyes (non-tartrazine), ferrous gluconate, gabapentin, hydrocodone, iron, butalbital-apap-caffeine, codeine, iodine, latex, meclizine, and robaxin [methocarbamol]. Drug: Kristine [name]  reports no history of drug use. Alcohol:  reports no history of alcohol use. Tobacco:  reports that she quit smoking about 16 years ago. Her smoking use included cigarettes. She has never used smokeless tobacco. Social: Kristine [name]  reports that she quit smoking about 16 years ago. Her smoking use included cigarettes. She has never used smokeless tobacco. She reports that she does not drink alcohol and does not  use drugs. Medical:  has a past medical history of Abnormal weight gain (08/05/2013), Absence of interventricular septum (08/05/2013), Allergic rhinitis (04/17/2016), Allergic urticaria due to ingested food (02/09/2016), Anxiety, Asthma,  Cancer [facility]), Clinical depression ([id number]), Complication of anesthesia, Depression, Dizziness, Excess weight (09/30/2013), GERD (gastroesophageal reflux disease), Headache, migraine (09/30/2013), Heart murmur, Heavy drinker (04/11/2014), History of methicillin resistant staphylococcus aureus (MRSA) (11/2015), Hypothyroidism, Infection of urinary tract (09/30/2013), Irregular bleeding (09/30/2013), Multiple allergies, Pneumonia, Pre-diabetes, and VSD (ventricular septal defect and aortic arch hypoplasia. Surgical: Kristine [name]  has a past surgical history that includes Nasal sinus surgery; Colonoscopy (2006); Cystoscopy with hydrodistension and biopsy; Cesarean section; Laparoscopic assisted vaginal hysterectomy (N/A, 08/20/2016); Laparoscopic bilateral salpingectomy (Bilateral, 08/20/2016); Coronary angioplasty (2013); Cystoscopy (N/A, 10/23/2016); Botox injection (N/A, 10/23/2016); Colonoscopy with propofol (N/A, 07/04/2020); Breast biopsy (Left, 11/13/2013); Cosmetic surgery (06/09/2021); Tubal ligation (2003); Blepharoplasty (2019); Combined reduction mammaplasty w/ abdominoplasty (Bilateral, 05/2021); Esophagogastroduodenoscopy; Tonsillectomy; Anterior cervical decomp/discectomy fusion (N/A, 02/09/2022); Reduction mammaplasty (Bilateral, 06/11/2021); and Cervical fusion (2023). Family: family history includes Alcohol abuse in her mother; Aneurysm in her mother; Anxiety disorder in her father and mother; Bipolar disorder in her paternal aunt; Breast cancer in her maternal aunt, maternal grandmother, paternal aunt, paternal aunt, and paternal grandmother; Cancer in her father; Depression in her father and mother; Drug abuse in her mother; Kidney cancer in her maternal uncle;  Kidney disease in an other family member; Liver disease in her mother; Prostate cancer in her paternal grandfather.  Laboratory Chemistry Profile   Renal Lab Results  Component Value Date   BUN 17 09/19/2017   CREATININE 0.85 09/19/2017   BCR 27 (H) 04/17/2016   GFRAA >60 09/19/2017   GFRNONAA >60 09/19/2017    Hepatic Lab Results  Component Value Date   AST 27 09/04/2016   ALT 32 09/04/2016   ALBUMIN 4.1 09/04/2016   ALKPHOS 44 09/04/2016    Electrolytes Lab Results  Component Value Date   NA 137 09/19/2017   K 3.6 02/05/2022   CL 96 (L) 09/19/2017   CALCIUM 9.1 09/19/2017   MG 2.1 04/09/2017    Bone No results found for: "VD25OH", "VD125OH2TOT", "[id number]", "[id number]", "25OHVITD1", "25OHVITD2", "25OHVITD3", "TESTOFREE", "TESTOSTERONE"  Inflammation (CRP: Acute Phase) (ESR: Chronic Phase) Lab Results  Component Value Date   ESRSEDRATE 11 09/04/2016         Note: Above Lab results reviewed.  Recent Imaging Review  MR ANGIO NECK WO CONTRAST CLINICAL DATA:  Provided history: Intractable chronic migraine without aura and without status migrainosus. Additional history provided by the scanning technologist: The patient reports progressively worsening chronic headaches, syncope, blurred vision.  EXAM: MRA NECK WITHOUT CONTRAST  TECHNIQUE: Angiographic images of the neck were acquired using MRA technique without intravenous contrast. Carotid stenosis measurements (when applicable) are obtained utilizing NASCET criteria, using the distal internal carotid diameter as the denominator.  COMPARISON:  Same day MRV head 12/12/2022. Brain MRI 10/29/2022.  FINDINGS: Mildly motion degraded exam.  Aortic arch: Common origin of the innominate and left common carotid arteries. No significant innominate artery stenosis. Non-contrast technique limits evaluation of the subclavian arteries.  Right carotid system: CCA and ICA patent within the neck without hemodynamically  significant stenosis (50% or greater).  Left carotid system: CCA and ICA patent within the neck without hemodynamically significant stenosis (50% or greater).  Vertebral arteries: Vertebral arteries codominant and patent within the neck without hemodynamically significant stenosis (50% or greater).  IMPRESSION: 1. Mildly motion degraded exam. 2. The common carotid, internal carotid and vertebral arteries are patent within the neck without hemodynamically significant stenosis.  Electronically Signed   By: Kellie Simmering D.O.   On: 12/12/2022 17:17 MR MRV HEAD WO CM CLINICAL DATA:  Provided history: Intractable chronic migraine without aura and without status migrainosus. Additional history provided by the scanning technologist: The patient reports chronic headaches, progressively worsening for 2-3 months. Syncope. Blurred vision.  EXAM: MR VENOGRAM HEAD WITHOUT CONTRAST  TECHNIQUE: Angiographic images of the intracranial venous structures were acquired using MRV technique without intravenous contrast.  COMPARISON:  Brain MRI 10/29/2022.  FINDINGS: The superior sagittal sinus, internal cerebral veins, vein of Galen, straight sinus, transverse sinuses, sigmoid sinuses and visualized jugular veins are patent. There is no appreciable intracranial venous thrombosis. No evidence of dural venous sinus stenosis.  IMPRESSION: 1. No evidence of intracranial venous thrombosis. 2. No evidence of dural venous sinus stenosis.  Electronically Signed   By: Kellie Simmering D.O.   On: 12/12/2022 17:09   CLINICAL DATA:  Low back pain radiates into bilateral legs for years. Patient reports unable to control bladder and bowel movements for the past 2-3 months.   EXAM: MRI LUMBAR SPINE WITHOUT CONTRAST   TECHNIQUE: Multiplanar, multisequence MR imaging of the lumbar spine was performed. No intravenous contrast was administered.   COMPARISON:  None.   FINDINGS: Segmentation:  Transitional lumbosacral anatomy. For the purposes of this dictation, L5 is partially sacralized.   Alignment:  No substantial sagittal subluxation.   Vertebrae: Vertebral body heights are maintained. No focal marrow edema to suggest acute fracture discitis/osteomyelitis. No suspicious bone lesion.   Conus medullaris and cauda equina: Conus extends to the L1-L2 level. Conus appears normal.   Paraspinal and other soft tissues: Unremarkable.   Disc levels:   T12-L1: No significant disc protrusion, foraminal stenosis, or canal stenosis.   L1-L2: No significant disc protrusion, foraminal stenosis, or canal stenosis.   L2-L3: Mild facet arthropathy. No significant disc protrusion, foraminal stenosis, or canal stenosis.   L3-L4: Mild facet arthropathy and slight disc bulging. Mild bilateral foraminal stenosis. No significant canal stenosis.   L4-L5: Mild facet arthropathy and slight disc bulging. No significant canal or foraminal stenosis.   L5-S1: Transitional anatomy. No significant canal or foraminal stenosis.   IMPRESSION: 1. Transitional lumbosacral anatomy. For the purposes of this dictation, L5 is partially sacralized. Correlation with radiographs is recommended prior to any operative intervention. 2. Lower lumbar facet arthropathy with mild bilateral foraminal stenosis at L3-L4. No significant canal stenosis.     Electronically Signed   By: Margaretha Sheffield M.D.   On: 02/06/2022 13:20    Note: Reviewed        Physical Exam  General appearance: Well nourished, well developed, and well hydrated. In no apparent acute distress Mental status: Alert, oriented x 3 (person, place, & time)       Respiratory: No evidence of acute respiratory distress Eyes: PERLA Vitals: BP 126/79   Pulse 87   Temp (!) 97.4 F (36.3 C) (Temporal)   Resp 18   Ht 5' 2.5" (1.588 m)   Wt 170 lb (77.1 kg)   LMP 07/17/2016 (Approximate)   SpO2 97%   BMI 30.60 kg/m  BMI: Estimated  body mass index is 30.6 kg/m as calculated from the following:   Height as of this encounter: 5' 2.5" (1.588 m).   Weight as of this encounter: 170 lb (77.1 kg). Ideal: Ideal body weight: 51.3 kg (112 lb 15.8 oz) Adjusted ideal body weight: 61.6 kg (135 lb 12.7 oz)  Lumbar Spine Area Exam  Skin & Axial Inspection: No masses, redness, or swelling Alignment: Symmetrical Functional ROM: Decreased ROM affecting both sides Stability: No instability detected Muscle Tone/Strength: Functionally  intact. No obvious neuro-muscular anomalies detected. Sensory (Neurological): Musculoskeletal pain pattern  Provocative Tests: Hyperextension/rotation test: (+) bilaterally for facet joint pain. Lumbar quadrant test (Kemp's test): (+) bilaterally for facet joint pain.  Gait & Posture Assessment  Ambulation: Unassisted Gait: Relatively normal for age and body habitus Posture: WNL  Lower Extremity Exam    Side: Right lower extremity  Side: Left lower extremity  Stability: No instability observed          Stability: No instability observed          Skin & Extremity Inspection: Skin color, temperature, and hair growth are WNL. No peripheral edema or cyanosis. No masses, redness, swelling, asymmetry, or associated skin lesions. No contractures.  Skin & Extremity Inspection: Skin color, temperature, and hair growth are WNL. No peripheral edema or cyanosis. No masses, redness, swelling, asymmetry, or associated skin lesions. No contractures.  Functional ROM: Pain restricted ROM for knee joint          Functional ROM: Unrestricted ROM                  Muscle Tone/Strength: Functionally intact. No obvious neuro-muscular anomalies detected.  Muscle Tone/Strength: Functionally intact. No obvious neuro-muscular anomalies detected.  Sensory (Neurological): Arthropathic arthralgia        Sensory (Neurological): Unimpaired        DTR: Patellar: deferred today Achilles: deferred today Plantar: deferred today   DTR: Patellar: deferred today Achilles: deferred today Plantar: deferred today  Palpation: No palpable anomalies  Palpation: No palpable anomalies    Assessment   Diagnosis Status  1. Lumbar facet arthropathy   2. Foraminal stenosis of lumbar region (LEFT, mild L3/4)   3. Chronic pain syndrome    Having a Flare-up Persistent Controlled   Updated Problems: Problem  Lumbar Facet Arthropathy  Foraminal stenosis of lumbar region (LEFT, mild L3/4)  Chronic Pain Syndrome    Plan of Kristine [name] has a history of greater than 3 months of moderate to severe pain which is resulted in functional impairment.  The patient has tried various conservative therapeutic options such as NSAIDs, Tylenol, muscle relaxants, physical therapy which was inadequately effective.  Patient's pain is predominantly axial with MRI & physical exam findings suggestive of facet arthropathy. Lumbar facet medial branch nerve blocks were discussed with the patient.  Risks and benefits were reviewed.  Patient would like to proceed with bilateral L2, L3, L4, L5 medial branch nerve block.    Orders:  Orders Placed This Encounter  Procedures   LUMBAR FACET(MEDIAL BRANCH NERVE BLOCK) MBNB    Standing Status:   Future    Standing Expiration Date:   05/08/2023    Scheduling Instructions:     Procedure: Lumbar facet block (AKA.: Lumbosacral medial branch nerve block)     Side: Bilateral     Level: L2-3, L3-4, L4-5,  Facets  L2, L3, L4, L5, Medial Branch)     Sedation: Patient's choice.     Timeframe: ASAA    Order Specific Question:   Where will this procedure be performed?    Answer:   [facility]   Follow-up plan:   Return in about 20 days (around 02/25/2023) for B/L L2, 3, 4, 5 MBNB #1, in clinic IV Versed.      C7/T1 ESI 10/16/21 (3 cc), 12/18/21 (4 cc)      Recent Visits No visits were found meeting these conditions. Showing recent visits within past 90 days and meeting all other  requirements Today's Visits Date Type Provider Dept  02/05/23 Office Visit Gillis Santa, MD [facility]  Showing today's visits and meeting all other requirements Future Appointments No visits were found meeting these conditions. Showing future appointments within next 90 days and meeting all other requirements  I discussed the assessment and treatment plan with the patient. The patient was provided an opportunity to ask questions and all were answered. The patient agreed with the plan and demonstrated an understanding of the instructions.  Patient advised to call back or seek an in-person evaluation if the symptoms or condition worsens.  Duration of encounter: 43minutes.  Total time on encounter, as per AMA guidelines included both the face-to-face and non-face-to-face time personally spent by the physician and/or other qualified health care professional(s) on the day of the encounter (includes time in activities that require the physician or other qualified health care professional and does not include time in activities normally performed by clinical staff). Physician's time may include the following activities when performed: Preparing to see the patient (e.g., pre-charting review of records, searching for previously ordered imaging, lab work, and nerve conduction tests) Review of prior analgesic pharmacotherapies. Reviewing PMP Interpreting ordered tests (e.g., lab work, imaging, nerve conduction tests) Performing post-procedure evaluations, including interpretation of diagnostic procedures Obtaining and/or reviewing separately obtained history Performing a medically appropriate examination and/or evaluation Counseling and educating the patient/family/caregiver Ordering medications, tests, or procedures Referring and communicating with other health care professionals (when not separately reported) Documenting clinical information in the electronic or other health  record Independently interpreting results (not separately reported) and communicating results to the patient/ family/caregiver Care coordination (not separately reported)  Note by: Gillis Santa, MD Date: 02/05/2023; Time: 2:22 PM

## 2023-02-14 ENCOUNTER — Encounter: Payer: Self-pay | Admitting: Student in an Organized Health Care Education/Training Program

## 2023-02-14 DIAGNOSIS — G894 Chronic pain syndrome: Secondary | ICD-10-CM

## 2023-02-14 DIAGNOSIS — M48061 Spinal stenosis, lumbar region without neurogenic claudication: Secondary | ICD-10-CM

## 2023-02-14 DIAGNOSIS — M47816 Spondylosis without myelopathy or radiculopathy, lumbar region: Secondary | ICD-10-CM

## 2023-02-15 ENCOUNTER — Telehealth: Payer: Self-pay | Admitting: Student in an Organized Health Care Education/Training Program

## 2023-02-15 NOTE — Telephone Encounter (Signed)
PT called stated that she is in a lot of pain and wants to know if she can take Prednisone until she comes in for the procedure on 02/25/23. PT stated that she had send him a msg on mychart too. Please give patient a call. TY

## 2023-02-18 ENCOUNTER — Telehealth: Payer: Self-pay | Admitting: Student in an Organized Health Care Education/Training Program

## 2023-02-18 MED ORDER — PREDNISONE 20 MG PO TABS: ORAL_TABLET | ORAL | 0 refills | Status: DC

## 2023-02-18 MED ORDER — PREDNISONE 20 MG PO TABS: ORAL_TABLET | ORAL | 0 refills | Status: AC

## 2023-02-18 NOTE — Telephone Encounter (Signed)
Patient spoke with her orthopedic surgeon and was told not to take the predinisone, he is sending in a script for tramadol to help her until her surgery on the 25th

## 2023-02-25 ENCOUNTER — Ambulatory Visit
Payer: BC Managed Care – PPO | Attending: Student in an Organized Health Care Education/Training Program | Admitting: Student in an Organized Health Care Education/Training Program

## 2023-02-25 ENCOUNTER — Ambulatory Visit
Admission: RE | Admit: 2023-02-25 | Discharge: 2023-02-25 | Disposition: A | Discharge status: Home / Self Care | Payer: BC Managed Care – PPO | Source: Ambulatory Visit | Attending: Student in an Organized Health Care Education/Training Program | Admitting: Student in an Organized Health Care Education/Training Program

## 2023-02-25 DIAGNOSIS — G894 Chronic pain syndrome: Secondary | ICD-10-CM | POA: Insufficient documentation

## 2023-02-25 DIAGNOSIS — M47816 Spondylosis without myelopathy or radiculopathy, lumbar region: Secondary | ICD-10-CM | POA: Insufficient documentation

## 2023-02-25 MED ORDER — LIDOCAINE HCL 2 % IJ SOLN
20.0000 mL | Freq: Once | INTRAMUSCULAR | Status: AC
  Administered 2023-02-25: 400 mg

## 2023-02-25 MED ORDER — ROPIVACAINE HCL 2 MG/ML IJ SOLN
9.0000 mL | Freq: Once | INTRAMUSCULAR | Status: AC
  Administered 2023-02-25: 9 mL via PERINEURAL

## 2023-02-25 MED ORDER — LACTATED RINGERS IV SOLN: Freq: Once | INTRAVENOUS | Status: AC

## 2023-02-25 MED ORDER — FENTANYL CITRATE (PF) 100 MCG/2ML IJ SOLN: 25.0000 ug | INTRAMUSCULAR | Status: DC | PRN

## 2023-02-25 MED ORDER — LIDOCAINE HCL 2 % IJ SOLN
INTRAMUSCULAR | Status: AC
  Filled 2023-02-25: qty 20

## 2023-02-25 MED ORDER — DEXAMETHASONE SODIUM PHOSPHATE 10 MG/ML IJ SOLN
10.0000 mg | Freq: Once | INTRAMUSCULAR | Status: AC
  Administered 2023-02-25: 10 mg

## 2023-02-25 MED ORDER — DEXAMETHASONE SODIUM PHOSPHATE 10 MG/ML IJ SOLN
INTRAMUSCULAR | Status: AC
  Filled 2023-02-25: qty 2

## 2023-02-25 MED ORDER — MIDAZOLAM HCL 2 MG/2ML IJ SOLN
INTRAMUSCULAR | Status: AC
  Filled 2023-02-25: qty 2

## 2023-02-25 MED ORDER — ROPIVACAINE HCL 2 MG/ML IJ SOLN
INTRAMUSCULAR | Status: AC
  Filled 2023-02-25: qty 20

## 2023-02-25 MED ORDER — MIDAZOLAM HCL 5 MG/5ML IJ SOLN
0.5000 mg | Freq: Once | INTRAMUSCULAR | Status: AC
  Administered 2023-02-25: 2 mg via INTRAVENOUS

## 2023-02-25 NOTE — Progress Notes (Signed)
Safety precautions to be maintained throughout the outpatient stay will include: orient to surroundings, keep bed in low position, maintain call bell within reach at all times, provide assistance with transfer out of bed and ambulation.  

## 2023-02-25 NOTE — Patient Instructions (Signed)

## 2023-02-25 NOTE — Progress Notes (Signed)
PROVIDER NOTE: Interpretation of information contained herein should be left to medically-trained personnel. Specific patient instructions are provided elsewhere under "Patient Instructions" section of medical record. This document was created in part using STT-dictation technology, any transcriptional errors that may result from this process are unintentional.  Patient: Kristine [name] Type: Established DOB: 1971/02/26 MRN: [id number] PCP: Enid Baas, MD  Service: Procedure DOS: 02/25/2023 Setting: Ambulatory Location: Ambulatory outpatient facility Delivery: Face-to-face Provider: Edward Jolly, MD Specialty: Interventional Pain Management Specialty designation: 09 Location: Outpatient facility Ref. Prov.: Edward Jolly, MD       Interventional Therapy   Procedure: Lumbar Facet, Medial Branch Block(s) #1  Laterality: Bilateral  Level: L2, L3, L4, and L5 Medial Branch Level(s). Injecting these levels blocks the L2-3, L3-4, and L4-5 lumbar facet joints. Imaging: Fluoroscopic guidance         Anesthesia: Local anesthesia (1-2% Lidocaine) Anxiolysis: IV Versed         DOS: 02/25/2023 Performed by: Edward Jolly, MD  Primary Purpose: Diagnostic/Therapeutic Indications: Low back pain severe enough to impact quality of life or function. 1. Lumbar facet arthropathy   2. Chronic pain syndrome    NAS-11 Pain score:   Pre-procedure: 7 /10   Post-procedure: 0-No pain/10     Position / Prep / Materials:  Position: Prone  Prep solution: DuraPrep (Iodine Povacrylex [0.7% available iodine] and Isopropyl Alcohol, 74% w/w) Area Prepped: Posterolateral Lumbosacral Spine (Wide prep: From the lower border of the scapula down to the end of the tailbone and from flank to flank.)  Materials:  Tray: Block Needle(s):  Type: Spinal  Gauge (G): 22  Length: 3.5-in Qty: 3      Pre-op H&P Assessment:  Kristine [name] is a 52 y.o. (year old), female patient, seen today for interventional  treatment. She  has a past surgical history that includes Nasal sinus surgery; Colonoscopy (2006); Cystoscopy with hydrodistension and biopsy; Cesarean section; Laparoscopic assisted vaginal hysterectomy (N/A, 08/20/2016); Laparoscopic bilateral salpingectomy (Bilateral, 08/20/2016); Coronary angioplasty (2013); Cystoscopy (N/A, 10/23/2016); Botox injection (N/A, 10/23/2016); Colonoscopy with propofol (N/A, 07/04/2020); Breast biopsy (Left, 11/13/2013); Cosmetic surgery (06/09/2021); Tubal ligation (2003); Blepharoplasty (2019); Combined reduction mammaplasty w/ abdominoplasty (Bilateral, 05/2021); Esophagogastroduodenoscopy; Tonsillectomy; Anterior cervical decomp/discectomy fusion (N/A, 02/09/2022); Reduction mammaplasty (Bilateral, 06/11/2021); and Cervical fusion (2023). Kristine [name] has a current medication list which includes the following prescription(s): albuterol, alprazolam, azelastine, cetirizine, cranberry, desvenlafaxine, epinephrine, hydrochlorothiazide, levocetirizine, levothyroxine, montelukast, multiple vitamin, nitrofurantoin (macrocrystal-monohydrate), omalizumab, ondansetron, ubrelvy, cyanocobalamin, ascorbic acid, polyethylene glycol, prednisone, and ozempic (2 mg/dose). Her primarily concern today is the Back Pain  Initial Vital Signs:  Pulse/HCG Rate: 84ECG Heart Rate: 76 Temp: (!) 97.2 F (36.2 C) Resp: 16 BP: 122/69 SpO2: 98 %  BMI: Estimated body mass index is 31.09 kg/m as calculated from the following:   Height as of this encounter:  (1.575 m).   Weight as of this encounter: 170 lb (77.1 kg).  Risk Assessment: Allergies: Reviewed. She is allergic to bee pollen, bee venom, egg-derived products, keflex [cephalexin], milk (cow), milk-related compounds, penicillins, red dye, shellfish allergy, soy allergy, tilactase, topamax [topiramate], wheat, yellow dyes (non-tartrazine), ferrous gluconate, gabapentin, hydrocodone, iron, butalbital-apap-caffeine, codeine, iodine, latex,  meclizine, and robaxin [methocarbamol].  Allergy Precautions: None required Coagulopathies: Reviewed. None identified.  Blood-thinner therapy: None at this time Active Infection(s): Reviewed. None identified. Kristine [name] is afebrile  Site Confirmation: Kristine [name] was asked to confirm the procedure and laterality before marking the site Procedure checklist: Completed Consent: Before the procedure and under the influence of  no sedative(s), amnesic(s), or anxiolytics, the patient was informed of the treatment options, risks and possible complications. To fulfill our ethical and legal obligations, as recommended by the American Medical Association's Code of Ethics, I have informed the patient of my clinical impression; the nature and purpose of the treatment or procedure; the risks, benefits, and possible complications of the intervention; the alternatives, including doing nothing; the risk(s) and benefit(s) of the alternative treatment(s) or procedure(s); and the risk(s) and benefit(s) of doing nothing. The patient was provided information about the general risks and possible complications associated with the procedure. These may include, but are not limited to: failure to achieve desired goals, infection, bleeding, organ or nerve damage, allergic reactions, paralysis, and death. In addition, the patient was informed of those risks and complications associated to Spine-related procedures, such as failure to decrease pain; infection (i.e.: Meningitis, epidural or intraspinal abscess); bleeding (i.e.: epidural hematoma, subarachnoid hemorrhage, or any other type of intraspinal or peri-dural bleeding); organ or nerve damage (i.e.: Any type of peripheral nerve, nerve root, or spinal cord injury) with subsequent damage to sensory, motor, and/or autonomic systems, resulting in permanent pain, numbness, and/or weakness of one or several areas of the body; allergic reactions; (i.e.: anaphylactic reaction); and/or  death. Furthermore, the patient was informed of those risks and complications associated with the medications. These include, but are not limited to: allergic reactions (i.e.: anaphylactic or anaphylactoid reaction(s)); adrenal axis suppression; blood sugar elevation that in diabetics may result in ketoacidosis or comma; water retention that in patients with history of congestive heart failure may result in shortness of breath, pulmonary edema, and decompensation with resultant heart failure; weight gain; swelling or edema; medication-induced neural toxicity; particulate matter embolism and blood vessel occlusion with resultant organ, and/or nervous system infarction; and/or aseptic necrosis of one or more joints. Finally, the patient was informed that Medicine is not an exact science; therefore, there is also the possibility of unforeseen or unpredictable risks and/or possible complications that may result in a catastrophic outcome. The patient indicated having understood very clearly. We have given the patient no guarantees and we have made no promises. Enough time was given to the patient to ask questions, all of which were answered to the patient's satisfaction. Kristine [name] has indicated that she wanted to continue with the procedure. Attestation: I, the ordering provider, attest that I have discussed with the patient the benefits, risks, side-effects, alternatives, likelihood of achieving goals, and potential problems during recovery for the procedure that I have provided informed consent. Date  Time: 02/25/2023  7:57 AM   Pre-Procedure Preparation:  Monitoring: As per clinic protocol. Respiration, ETCO2, SpO2, BP, heart rate and rhythm monitor placed and checked for adequate function Safety Precautions: Patient was assessed for positional comfort and pressure points before starting the procedure. Time-out: I initiated and conducted the "Time-out" before starting the procedure, as per protocol. The  patient was asked to participate by confirming the accuracy of the "Time Out" information. Verification of the correct person, site, and procedure were performed and confirmed by me, the nursing staff, and the patient. "Time-out" conducted as per Joint Commission's Universal Protocol (UP.01.01.01). Time: 0833 Start Time: 0833 hrs.  Description of Procedure:          Laterality: (see above) Targeted Levels: (see above)  Safety Precautions: Aspiration looking for blood return was conducted prior to all injections. At no point did we inject any substances, as a needle was being advanced. Before injecting, the patient was told to  immediately notify me if she was experiencing any new onset of "ringing in the ears, or metallic taste in the mouth". No attempts were made at seeking any paresthesias. Safe injection practices and needle disposal techniques used. Medications properly checked for expiration dates. SDV (single dose vial) medications used. After the completion of the procedure, all disposable equipment used was discarded in the proper designated medical waste containers. Local Anesthesia: Protocol guidelines were followed. The patient was positioned over the fluoroscopy table. The area was prepped in the usual manner. The time-out was completed. The target area was identified using fluoroscopy. A 12-in long, straight, sterile hemostat was used with fluoroscopic guidance to locate the targets for each level blocked. Once located, the skin was marked with an approved surgical skin marker. Once all sites were marked, the skin (epidermis, dermis, and hypodermis), as well as deeper tissues (fat, connective tissue and muscle) were infiltrated with a small amount of a short-acting local anesthetic, loaded on a 10cc syringe with a 25G, 1.5-in  Needle. An appropriate amount of time was allowed for local anesthetics to take effect before proceeding to the next step. Local Anesthetic: Lidocaine 2.0% The unused  portion of the local anesthetic was discarded in the proper designated containers. Technical description of process:  L2 Medial Branch Nerve Block (MBB): The target area for the L2 medial branch is at the junction of the postero-lateral aspect of the superior articular process and the superior, posterior, and medial edge of the transverse process of L3. Under fluoroscopic guidance, a Quincke needle was inserted until contact was made with os over the superior postero-lateral aspect of the pedicular shadow (target area). After negative aspiration for blood, 2mL of the nerve block solution was injected without difficulty or complication. The needle was removed intact. L3 Medial Branch Nerve Block (MBB): The target area for the L3 medial branch is at the junction of the postero-lateral aspect of the superior articular process and the superior, posterior, and medial edge of the transverse process of L4. Under fluoroscopic guidance, a Quincke needle was inserted until contact was made with os over the superior postero-lateral aspect of the pedicular shadow (target area). After negative aspiration for blood, 2mL of the nerve block solution was injected without difficulty or complication. The needle was removed intact. L4 Medial Branch Nerve Block (MBB): The target area for the L4 medial branch is at the junction of the postero-lateral aspect of the superior articular process and the superior, posterior, and medial edge of the transverse process of L5. Under fluoroscopic guidance, a Quincke needle was inserted until contact was made with os over the superior postero-lateral aspect of the pedicular shadow (target area). After negative aspiration for blood, 2mL of the nerve block solution was injected without difficulty or complication. The needle was removed intact. L5 Medial Branch Nerve Block (MBB): The target area for the L5 medial branch is at the junction of the postero-lateral aspect of the superior articular  process and the superior, posterior, and medial edge of the sacral ala. Under fluoroscopic guidance, a Quincke needle was inserted until contact was made with os over the superior postero-lateral aspect of the pedicular shadow (target area). After negative aspiration for blood, 2mL of the nerve block solution was injected without difficulty or complication. The needle was removed intact.   Once the entire procedure was completed, the treated area was cleaned, making sure to leave some of the prepping solution back to take advantage of its long term bactericidal properties.  Illustration of the posterior view of the lumbar spine and the posterior neural structures. Laminae of L2 through S1 are labeled. DPRL5, dorsal primary ramus of L5; DPRS1, dorsal primary ramus of S1; DPR3, dorsal primary ramus of L3; FJ, facet (zygapophyseal) joint L3-L4; I, inferior articular process of L4; LB1, lateral branch of dorsal primary ramus of L1; IAB, inferior articular branches from L3 medial branch (supplies L4-L5 facet joint); IBP, intermediate branch plexus; MB3, medial branch of dorsal primary ramus of L3; NR3, third lumbar nerve root; S, superior articular process of L5; SAB, superior articular branches from L4 (supplies L4-5 facet joint also); TP3, transverse process of L3.   Facet Joint Innervation (* possible contribution)  L1-2 T12, L1 (L2*)  Medial Branch  L2-3 L1, L2 (L3*)         "          "  L3-4 L2, L3 (L4*)         "          "  L4-5 L3, L4 (L5*)         "          "  L5-S1 L4, L5, S1          "          "    Vitals:   02/25/23 0839 02/25/23 0843 02/25/23 0846 02/25/23 0855  BP: (!) 85/48 (!) 89/55 122/78 117/67  Pulse:      Resp: 18 18 18 16   Temp:      SpO2: 95% 95% 95% 95%  Weight:      Height:         End Time: 0846 hrs.  Imaging Guidance (Spinal):          Type of Imaging Technique: Fluoroscopy Guidance (Spinal) Indication(s): Assistance in needle guidance and placement  for procedures requiring needle placement in or near specific anatomical locations not easily accessible without such assistance. Exposure Time: Please see nurses notes. Contrast: None used. Fluoroscopic Guidance: I was personally present during the use of fluoroscopy. "Tunnel Vision Technique" used to obtain the best possible view of the target area. Parallax error corrected before commencing the procedure. "Direction-depth-direction" technique used to introduce the needle under continuous pulsed fluoroscopy. Once target was reached, antero-posterior, oblique, and lateral fluoroscopic projection used confirm needle placement in all planes. Images permanently stored in EMR. Interpretation: No contrast injected. I personally interpreted the imaging intraoperatively. Adequate needle placement confirmed in multiple planes. Permanent images saved into the patient's record.  Post-operative Assessment:  Post-procedure Vital Signs:  Pulse/HCG Rate: 8471 Temp: (!) 97.2 F (36.2 C) Resp: 16 BP: 117/67 SpO2: 95 %  EBL: None  Complications: No immediate post-treatment complications observed by team, or reported by patient.  Note: The patient tolerated the entire procedure well. A repeat set of vitals were taken after the procedure and the patient was kept under observation following institutional policy, for this type of procedure. Post-procedural neurological assessment was performed, showing return to baseline, prior to discharge. The patient was provided with post-procedure discharge instructions, including a section on how to identify potential problems. Should any problems arise concerning this procedure, the patient was given instructions to immediately contact us, at any time, without hesitation. In any case, we plan to contact the patient by telephone for a follow-up status report regarding this interventional procedure.  Comments:  No additional relevant information.  Plan of Care (POC)   Orders:  Orders Placed This Encounter  Procedures  DG PAIN CLINIC C-ARM 1-60 MIN NO REPORT    Intraoperative interpretation by procedural physician at [facility].    Standing Status:   Standing    Number of Occurrences:   1    Order Specific Question:   Reason for exam:    Answer:   Assistance in needle guidance and placement for procedures requiring needle placement in or near specific anatomical locations not easily accessible without such assistance.     Medications ordered for procedure: Meds ordered this encounter  Medications   lidocaine (XYLOCAINE) 2 % (with pres) injection 400 mg   lactated ringers infusion   midazolam (VERSED) 5 MG/5ML injection 0.5-2 mg    Make sure Flumazenil is available in the pyxis when using this medication. If oversedation occurs, administer 0.2 mg IV over 15 sec. If after 45 sec no response, administer 0.2 mg again over 1 min; may repeat at 1 min intervals; not to exceed 4 doses (1 mg)   DISCONTD: fentaNYL (SUBLIMAZE) injection 25-50 mcg    Make sure Narcan is available in the pyxis when using this medication. In the event of respiratory depression (RR< 8/min): Titrate NARCAN (naloxone) in increments of 0.1 to 0.2 mg IV at 2-3 minute intervals, until desired degree of reversal.   dexamethasone (DECADRON) injection 10 mg   dexamethasone (DECADRON) injection 10 mg   ropivacaine (PF) 2 mg/mL (0.2%) (NAROPIN) injection 9 mL   ropivacaine (PF) 2 mg/mL (0.2%) (NAROPIN) injection 9 mL   Medications administered: We administered lidocaine, lactated ringers, midazolam, dexamethasone, dexamethasone, ropivacaine (PF) 2 mg/mL (0.2%), and ropivacaine (PF) 2 mg/mL (0.2%).  See the medical record for exact dosing, route, and time of administration.  Follow-up plan:   Return in about 4 weeks (around 03/25/2023) for VV PPE.       C7/T1 ESI 10/16/21 (3 cc), 12/18/21 (4 cc); B/L L2,3,4,5, MBNB       Recent Visits Date Type Provider Dept  02/05/23  Office Visit Edward Jolly, MD [facility]  Showing recent visits within past 90 days and meeting all other requirements Today's Visits Date Type Provider Dept  02/25/23 Procedure visit Edward Jolly, MD [facility]  Showing today's visits and meeting all other requirements Future Appointments Date Type Provider Dept  03/25/23 Appointment Edward Jolly, MD [facility]  Showing future appointments within next 90 days and meeting all other requirements  Disposition: Discharge home  Discharge (Date  Time): 02/25/2023; 0900 hrs.   Primary Care Physician: Enid Baas, MD Location: [facility] Note by: Edward Jolly, MD (TTS technology used. I apologize for any typographical errors that were not detected and corrected.) Date: 02/25/2023; Time: 9:31 AM  Disclaimer:  Medicine is not an Visual merchandiser. The only guarantee in medicine is that nothing is guaranteed. It is important to note that the decision to proceed with this intervention was based on the information collected from the patient. The Data and conclusions were drawn from the patient's questionnaire, the interview, and the physical examination. Because the information was provided in large part by the patient, it cannot be guaranteed that it has not been purposely or unconsciously manipulated. Every effort has been made to obtain as much relevant data as possible for this evaluation. It is important to note that the conclusions that lead to this procedure are derived in large part from the available data. Always take into account that the treatment will also be dependent on availability of resources and existing treatment guidelines,  considered by other Pain Management Practitioners as being common knowledge and practice, at the time of the intervention. For Medico-Legal purposes, it is also important to point out that variation in procedural techniques and pharmacological choices  are the acceptable norm. The indications, contraindications, technique, and results of the above procedure should only be interpreted and judged by a Board-Certified Interventional Pain Specialist with extensive familiarity and expertise in the same exact procedure and technique.

## 2023-02-26 ENCOUNTER — Ambulatory Visit: Payer: BC Managed Care – PPO | Admitting: Student in an Organized Health Care Education/Training Program

## 2023-02-26 ENCOUNTER — Telehealth: Payer: Self-pay

## 2023-02-26 NOTE — Telephone Encounter (Signed)
Post procedure follow up.  Patient states she Is doing ok.

## 2023-03-04 ENCOUNTER — Ambulatory Visit: Payer: BC Managed Care – PPO | Admitting: Orthopedic Surgery

## 2023-03-06 ENCOUNTER — Encounter: Payer: Self-pay | Admitting: Student in an Organized Health Care Education/Training Program

## 2023-03-11 ENCOUNTER — Telehealth: Payer: Self-pay | Admitting: Student in an Organized Health Care Education/Training Program

## 2023-03-11 NOTE — Telephone Encounter (Signed)
error 

## 2023-03-20 HISTORY — PX: KNEE SURGERY: SHX244

## 2023-03-22 NOTE — Progress Notes (Unsigned)
Spoke with patient re; BLF and her f/up appt for that on Monday 03/25/23.  She thinks she had good relief but states she is unable to remember.  Unable to find diary.  She is s/p knee replacement on 03/20/23.  Had to stay overnight d/t poor O2 Sats.  She was d/c on yesterday and is recovering at home.  I did encourage patient to try and find diary so that she will have some accurate information to convey to Dr Cherylann Ratel.  Patient states she will try.  Currently she is experiencing back pain d/t ambulating with walker and post surgical pain.

## 2023-03-25 ENCOUNTER — Ambulatory Visit
Payer: BC Managed Care – PPO | Attending: Student in an Organized Health Care Education/Training Program | Admitting: Student in an Organized Health Care Education/Training Program

## 2023-03-25 ENCOUNTER — Encounter: Payer: Self-pay | Admitting: Student in an Organized Health Care Education/Training Program

## 2023-03-25 DIAGNOSIS — M4802 Spinal stenosis, cervical region: Secondary | ICD-10-CM

## 2023-03-25 DIAGNOSIS — G894 Chronic pain syndrome: Secondary | ICD-10-CM

## 2023-03-25 DIAGNOSIS — M48061 Spinal stenosis, lumbar region without neurogenic claudication: Secondary | ICD-10-CM

## 2023-03-25 DIAGNOSIS — M47816 Spondylosis without myelopathy or radiculopathy, lumbar region: Secondary | ICD-10-CM

## 2023-03-25 NOTE — Progress Notes (Signed)
Patient: Kristine [name]  Service Category: E/M  Provider: Edward Jolly, MD  DOB: 1971/11/09  DOS: 03/25/2023  Location: Office  MRN: [id number]  Setting: Ambulatory outpatient  Referring Provider: Enid Baas, MD  Type: Established Patient  Specialty: Interventional Pain Management  PCP: Enid Baas, MD  Location: Remote location  Delivery: TeleHealth     Virtual Encounter - Pain Management PROVIDER NOTE: Information contained herein reflects review and annotations entered in association with encounter. Interpretation of such information and data should be left to medically-trained personnel. Information provided to patient can be located elsewhere in the medical record under "Patient Instructions". Document created using STT-dictation technology, any transcriptional errors that may result from process are unintentional.    Contact & Pharmacy Preferred: [phone] Home: [phone] (home) Mobile: [phone] (mobile) E-mail: [email or web address]  [id number] - [place], [place] - [address] [place] [address] Phone: [phone] Fax: [phone]   Pre-screening  Ms. Spratling offered "in-person" vs "virtual" encounter. She indicated preferring virtual for this encounter.   Reason COVID-19*  Social distancing based on CDC and AMA recommendations.   I contacted [facility] on 03/25/2023 via telephone.      I clearly identified myself as Edward Jolly, MD. I verified that I was speaking with the correct person using two identifiers (Name: Kristine [name], and date of birth: 08-30-1971).  Consent I sought verbal advanced consent from [name] for virtual visit interactions. I informed Kristine [name] of possible security and privacy concerns, risks, and limitations associated with providing "not-in-person" medical evaluation and management services. I also informed Kristine [name] of the availability of "in-person" appointments. Finally, I informed her  that there would be a charge for the virtual visit and that she could be  personally, fully or partially, financially responsible for it. Kristine [name] expressed understanding and agreed to proceed.   Historic Elements   Kristine [name] is a 52 y.o. year old, female patient evaluated today after our last contact on 03/11/2023. Kristine [name]  has a past medical history of Abnormal weight gain (08/05/2013), Absence of interventricular septum (08/05/2013), Allergic rhinitis (04/17/2016), Allergic urticaria due to ingested food (02/09/2016), Anxiety, Asthma, Cancer (HCC), Clinical depression (09/30/2013), Complication of anesthesia, Depression, Dizziness, Excess weight (09/30/2013), GERD (gastroesophageal reflux disease), Headache, migraine (09/30/2013), Heart murmur, Heavy drinker (04/11/2014), History of methicillin resistant staphylococcus aureus (MRSA) (11/2015), Hypothyroidism, Infection of urinary tract (09/30/2013), Irregular bleeding (09/30/2013), Multiple allergies, Pneumonia, Pre-diabetes, and VSD (ventricular septal defect and aortic arch hypoplasia. She also  has a past surgical history that includes Nasal sinus surgery; Colonoscopy (2006); Cystoscopy with hydrodistension and biopsy; Cesarean section; Laparoscopic assisted vaginal hysterectomy (N/A, 08/20/2016); Laparoscopic bilateral salpingectomy (Bilateral, 08/20/2016); Coronary angioplasty (2013); Cystoscopy (N/A, 10/23/2016); Botox injection (N/A, 10/23/2016); Colonoscopy with propofol (N/A, 07/04/2020); Breast biopsy (Left, 11/13/2013); Cosmetic surgery (06/09/2021); Tubal ligation (2003); Blepharoplasty (2019); Combined reduction mammaplasty w/ abdominoplasty (Bilateral, 05/2021); Esophagogastroduodenoscopy; Tonsillectomy; Anterior cervical decomp/discectomy fusion (N/A, 02/09/2022); Reduction mammaplasty (Bilateral, 06/11/2021); and Cervical fusion (2023). Kristine [name] has a current medication list which includes the following prescription(s):  albuterol, alprazolam, azelastine, cetirizine, cranberry, desvenlafaxine, epinephrine, hydrochlorothiazide, levocetirizine, levothyroxine, montelukast, multiple vitamin, nitrofurantoin (macrocrystal-monohydrate), omalizumab, ondansetron, polyethylene glycol, ozempic (2 mg/dose), ubrelvy, cyanocobalamin, and ascorbic acid. She  reports that she quit smoking about 16 years ago. Her smoking use included cigarettes. She has never used smokeless tobacco. She reports that she does not drink alcohol and does not use drugs. Kristine [name] is allergic to bee pollen, bee venom, egg-derived products, keflex [  cephalexin], milk (cow), milk-related compounds, penicillins, red dye, shellfish allergy, soy allergy, tilactase, topamax [topiramate], wheat, yellow dyes (non-tartrazine), ferrous gluconate, gabapentin, hydrocodone, iron, butalbital-apap-caffeine, codeine, iodine, latex, meclizine, and robaxin [methocarbamol].  BMI: Estimated body mass index is 31.09 kg/m as calculated from the following:   Height as of 02/25/23: 5\' 2"  (1.575 m).   Weight as of 02/25/23: 170 lb (77.1 kg). Last encounter: 02/05/2023. Last procedure: 02/25/2023.  HPI  Today, she is being contacted for a post-procedure assessment.   Post-procedure evaluation   Procedure: Lumbar Facet, Medial Branch Block(s) #1  Laterality: Bilateral  Level: L2, L3, L4, and L5 Medial Branch Level(s). Injecting these levels blocks the L2-3, L3-4, and L4-5 lumbar facet joints. Imaging: Fluoroscopic guidance         Anesthesia: Local anesthesia (1-2% Lidocaine) Anxiolysis: IV Versed         DOS: 02/25/2023 Performed by: Edward Jolly, MD  Primary Purpose: Diagnostic/Therapeutic Indications: Low back pain severe enough to impact quality of life or function. 1. Lumbar facet arthropathy   2. Chronic pain syndrome    NAS-11 Pain score:   Pre-procedure: 7 /10   Post-procedure: 0-No pain/10      Effectiveness:  Initial hour after procedure: 100 %  Subsequent  4-6 hours post-procedure: 100 %  Analgesia past initial 6 hours: 0 % (patient feels that she did get relief in her back, however she just had a knee replacement on Wednesday 03/20/23 and is having a hard time determining the relief from procedure.  unable to find diary)  Ongoing improvement:  Analgesic:  ?, unable to determine given acute pain from knee replacement surgery.     Laboratory Chemistry Profile   Renal Lab Results  Component Value Date   BUN 17 09/19/2017   CREATININE 0.85 09/19/2017   BCR 27 (H) 04/17/2016   GFRAA >60 09/19/2017   GFRNONAA >60 09/19/2017    Hepatic Lab Results  Component Value Date   AST 27 09/04/2016   ALT 32 09/04/2016   ALBUMIN 4.1 09/04/2016   ALKPHOS 44 09/04/2016    Electrolytes Lab Results  Component Value Date   NA 137 09/19/2017   K 3.6 02/05/2022   CL 96 (L) 09/19/2017   CALCIUM 9.1 09/19/2017   MG 2.1 04/09/2017    Bone No results found for: "VD25OH", "VD125OH2TOT", "ZO1096EA5", "WU9811BJ4", "25OHVITD1", "25OHVITD2", "25OHVITD3", "TESTOFREE", "TESTOSTERONE"  Inflammation (CRP: Acute Phase) (ESR: Chronic Phase) Lab Results  Component Value Date   ESRSEDRATE 11 09/04/2016         Note: Above Lab results reviewed.  Assessment  The primary encounter diagnosis was Lumbar facet arthropathy. Diagnoses of Foraminal stenosis of lumbar region (LEFT, mild L3/4), Neuroforaminal stenosis of cervical spine (C5/6), and Chronic pain syndrome were also pertinent to this visit.  Plan of Care  I instructed patient to reach out to [place] after she has recovered from her knee replacement surgery to discuss next steps regarding her low back.  Patient endorsed understanding.   Follow-up plan:   Return for patient will call to schedule F2F appt prn.      C7/T1 ESI 10/16/21 (3 cc), 12/18/21 (4 cc); B/L L2,3,4,5, MBNB    Recent Visits Date Type Provider Dept  02/25/23 Procedure visit Edward Jolly, MD [facility]  02/05/23 Office Visit  Edward Jolly, MD [facility]  Showing recent visits within past 90 days and meeting all other requirements Today's Visits Date Type Provider Dept  03/25/23 Office Visit Edward Jolly, MD [facility]  Showing today's visits and meeting all other requirements Future Appointments No visits were found meeting these conditions. Showing future appointments within next 90 days and meeting all other requirements  I discussed the assessment and treatment plan with the patient. The patient was provided an opportunity to ask questions and all were answered. The patient agreed with the plan and demonstrated an understanding of the instructions.  Patient advised to call back or seek an in-person evaluation if the symptoms or condition worsens.  Duration of encounter: .  Note by: Edward Jolly, MD Date: 03/25/2023; Time: 3:09 PM

## 2023-04-25 ENCOUNTER — Ambulatory Visit
Payer: BC Managed Care – PPO | Attending: Student in an Organized Health Care Education/Training Program | Admitting: Student in an Organized Health Care Education/Training Program

## 2023-04-25 ENCOUNTER — Encounter: Payer: Self-pay | Admitting: Student in an Organized Health Care Education/Training Program

## 2023-04-25 VITALS — BP 122/75 | HR 81 | Temp 97.5°F | Resp 17 | Ht 62.0 in | Wt 165.0 lb

## 2023-04-25 DIAGNOSIS — M48061 Spinal stenosis, lumbar region without neurogenic claudication: Secondary | ICD-10-CM | POA: Diagnosis present

## 2023-04-25 DIAGNOSIS — G8929 Other chronic pain: Secondary | ICD-10-CM | POA: Diagnosis present

## 2023-04-25 DIAGNOSIS — M5416 Radiculopathy, lumbar region: Secondary | ICD-10-CM | POA: Insufficient documentation

## 2023-04-25 DIAGNOSIS — G894 Chronic pain syndrome: Secondary | ICD-10-CM | POA: Insufficient documentation

## 2023-04-25 DIAGNOSIS — M4802 Spinal stenosis, cervical region: Secondary | ICD-10-CM

## 2023-04-25 NOTE — Patient Instructions (Signed)
GENERAL RISKS AND COMPLICATIONS ° °What are the risk, side effects and possible complications? °Generally speaking, most procedures are safe.  However, with any procedure there are risks, side effects, and the possibility of complications.  The risks and complications are dependent upon the sites that are lesioned, or the type of nerve block to be performed.  The closer the procedure is to the spine, the more serious the risks are.  Great care is taken when placing the radio frequency needles, block needles or lesioning probes, but sometimes complications can occur. °Infection: Any time there is an injection through the skin, there is a risk of infection.  This is why sterile conditions are used for these blocks.  There are four possible types of infection. °Localized skin infection. °Central Nervous System Infection-This can be in the form of Meningitis, which can be deadly. °Epidural Infections-This can be in the form of an epidural abscess, which can cause pressure inside of the spine, causing compression of the spinal cord with subsequent paralysis. This would require an emergency surgery to decompress, and there are no guarantees that the patient would recover from the paralysis. °Discitis-This is an infection of the intervertebral discs.  It occurs in about 1% of discography procedures.  It is difficult to treat and it may lead to surgery. ° °      2. Pain: the needles have to go through skin and soft tissues, will cause soreness. °      3. Damage to internal structures:  The nerves to be lesioned may be near blood vessels or   ° other nerves which can be potentially damaged. °      4. Bleeding: Bleeding is more common if the patient is taking blood thinners such as  aspirin, Coumadin, Ticiid, Plavix, etc., or if he/she have some genetic predisposition  such as hemophilia. Bleeding into the spinal canal can cause compression of the spinal  cord with subsequent paralysis.  This would require an emergency  surgery to  decompress and there are no guarantees that the patient would recover from the  paralysis. °      5. Pneumothorax:  Puncturing of a lung is a possibility, every time a needle is introduced in  the area of the chest or upper back.  Pneumothorax refers to free air around the  collapsed lung(s), inside of the thoracic cavity (chest cavity).  Another two possible  complications related to a similar event would include: Hemothorax and Chylothorax.   These are variations of the Pneumothorax, where instead of air around the collapsed  lung(s), you may have blood or chyle, respectively. °      6. Spinal headaches: They may occur with any procedures in the area of the spine. °      7. Persistent CSF (Cerebro-Spinal Fluid) leakage: This is a rare problem, but may occur  with prolonged intrathecal or epidural catheters either due to the formation of a fistulous  track or a dural tear. °      8. Nerve damage: By working so close to the spinal cord, there is always a possibility of  nerve damage, which could be as serious as a permanent spinal cord injury with  paralysis. °      9. Death:  Although rare, severe deadly allergic reactions known as "Anaphylactic  reaction" can occur to any of the medications used. °     10. Worsening of the symptoms:  We can always make thing worse. ° °What are the chances   of something like this happening? Chances of any of this occuring are extremely low.  By statistics, you have more of a chance of getting killed in a motor vehicle accident: while driving to the hospital than any of the above occurring .  Nevertheless, you should be aware that they are possibilities.  In general, it is similar to taking a shower.  Everybody knows that you can slip, hit your head and get killed.  Does that mean that you should not shower again?  Nevertheless always keep in mind that statistics do not mean anything if you happen to be on the wrong side of them.  Even if a procedure has a 1 (one) in a  1,000,000 (million) chance of going wrong, it you happen to be that one..Also, keep in mind that by statistics, you have more of a chance of having something go wrong when taking medications.  Who should not have this procedure? If you are on a blood thinning medication (e.g. Coumadin, Plavix, see list of "Blood Thinners"), or if you have an active infection going on, you should not have the procedure.  If you are taking any blood thinners, please inform your physician.  How should I prepare for this procedure? Do not eat or drink anything at least six hours prior to the procedure. Bring a driver with you .  It cannot be a taxi. Come accompanied by an adult that can drive you back, and that is strong enough to help you if your legs get weak or numb from the local anesthetic. Take all of your medicines the morning of the procedure with just enough water to swallow them. If you have diabetes, make sure that you are scheduled to have your procedure done first thing in the morning, whenever possible. If you have diabetes, take only half of your insulin dose and notify our nurse that you have done so as soon as you arrive at the clinic. If you are diabetic, but only take blood sugar pills (oral hypoglycemic), then do not take them on the morning of your procedure.  You may take them after you have had the procedure. Do not take aspirin or any aspirin-containing medications, at least eleven (11) days prior to the procedure.  They may prolong bleeding. Wear loose fitting clothing that may be easy to take off and that you would not mind if it got stained with Betadine or blood. Do not wear any jewelry or perfume Remove any nail coloring.  It will interfere with some of our monitoring equipment.  NOTE: Remember that this is not meant to be interpreted as a complete list of all possible complications.  Unforeseen problems may occur.  BLOOD THINNERS The following drugs contain aspirin or other products,  which can cause increased bleeding during surgery and should not be taken for 2 weeks prior to and 1 week after surgery.  If you should need take something for relief of minor pain, you may take acetaminophen which is found in Tylenol,m Datril, Anacin-3 and Panadol. It is not blood thinner. The products listed below are.  Do not take any of the products listed below in addition to any listed on your instruction sheet.  A.P.C or A.P.C with Codeine Codeine Phosphate Capsules #3 Ibuprofen Ridaura  ABC compound Congesprin Imuran rimadil  Advil Cope Indocin Robaxisal  Alka-Seltzer Effervescent Pain Reliever and Antacid Coricidin or Coricidin-D  Indomethacin Rufen  Alka-Seltzer plus Cold Medicine Cosprin Ketoprofen S-A-C Tablets  Anacin Analgesic Tablets or Capsules Coumadin   Korlgesic Salflex  Anacin Extra Strength Analgesic tablets or capsules CP-2 Tablets Lanoril Salicylate  Anaprox Cuprimine Capsules Levenox Salocol  Anexsia-D Dalteparin Magan Salsalate  Anodynos Darvon compound Magnesium Salicylate Sine-off  Ansaid Dasin Capsules Magsal Sodium Salicylate  Anturane Depen Capsules Marnal Soma  APF Arthritis pain formula Dewitt's Pills Measurin Stanback  Argesic Dia-Gesic Meclofenamic Sulfinpyrazone  Arthritis Bayer Timed Release Aspirin Diclofenac Meclomen Sulindac  Arthritis pain formula Anacin Dicumarol Medipren Supac  Analgesic (Safety coated) Arthralgen Diffunasal Mefanamic Suprofen  Arthritis Strength Bufferin Dihydrocodeine Mepro Compound Suprol  Arthropan liquid Dopirydamole Methcarbomol with Aspirin Synalgos  ASA tablets/Enseals Disalcid Micrainin Tagament  Ascriptin Doan's Midol Talwin  Ascriptin A/D Dolene Mobidin Tanderil  Ascriptin Extra Strength Dolobid Moblgesic Ticlid  Ascriptin with Codeine Doloprin or Doloprin with Codeine Momentum Tolectin  Asperbuf Duoprin Mono-gesic Trendar  Aspergum Duradyne Motrin or Motrin IB Triminicin  Aspirin plain, buffered or enteric coated  Durasal Myochrisine Trigesic  Aspirin Suppositories Easprin Nalfon Trillsate  Aspirin with Codeine Ecotrin Regular or Extra Strength Naprosyn Uracel  Atromid-S Efficin Naproxen Ursinus  Auranofin Capsules Elmiron Neocylate Vanquish  Axotal Emagrin Norgesic Verin  Azathioprine Empirin or Empirin with Codeine Normiflo Vitamin E  Azolid Emprazil Nuprin Voltaren  Bayer Aspirin plain, buffered or children's or timed BC Tablets or powders Encaprin Orgaran Warfarin Sodium  Buff-a-Comp Enoxaparin Orudis Zorpin  Buff-a-Comp with Codeine Equegesic Os-Cal-Gesic   Buffaprin Excedrin plain, buffered or Extra Strength Oxalid   Bufferin Arthritis Strength Feldene Oxphenbutazone   Bufferin plain or Extra Strength Feldene Capsules Oxycodone with Aspirin   Bufferin with Codeine Fenoprofen Fenoprofen Pabalate or Pabalate-SF   Buffets II Flogesic Panagesic   Buffinol plain or Extra Strength Florinal or Florinal with Codeine Panwarfarin   Buf-Tabs Flurbiprofen Penicillamine   Butalbital Compound Four-way cold tablets Penicillin   Butazolidin Fragmin Pepto-Bismol   Carbenicillin Geminisyn Percodan   Carna Arthritis Reliever Geopen Persantine   Carprofen Gold's salt Persistin   Chloramphenicol Goody's Phenylbutazone   Chloromycetin Haltrain Piroxlcam   Clmetidine heparin Plaquenil   Cllnoril Hyco-pap Ponstel   Clofibrate Hydroxy chloroquine Propoxyphen         Before stopping any of these medications, be sure to consult the physician who ordered them.  Some, such as Coumadin (Warfarin) are ordered to prevent or treat serious conditions such as "deep thrombosis", "pumonary embolisms", and other heart problems.  The amount of time that you may need off of the medication may also vary with the medication and the reason for which you were taking it.  If you are taking any of these medications, please make sure you notify your pain physician before you undergo any procedures.         Moderate Conscious  Sedation, Adult Sedation is the use of medicines to help you relax and not feel pain. Moderate conscious sedation is a type of sedation that makes you less alert than normal. You are still able to respond to instructions, touch, or both. This type of sedation is used during short medical and dental procedures. It is milder than deep sedation, which is a type of sedation you cannot be easily woken up from. It is also milder than general anesthesia, which is the use of medicines to make you fall asleep. Moderate conscious sedation lets you return to your normal activities sooner. Tell a health care provider about: Any allergies you have. All medicines you are taking, including vitamins, herbs, steroids, eye drops, creams, and over-the-counter medicines. Any problems you or family members have had with anesthesia.  Any bleeding problems you have. Any surgeries you have had. Any medical conditions you have. Whether you are pregnant or may be pregnant. Any recent alcohol, tobacco, or drug use. What are the risks? Your health care provider will talk with you about risks. These may include: Oversedation. This is when you get too much medicine. Nausea or vomiting. Allergic reaction to medicines. Trouble breathing. If this happens, a breathing tube may be used. It will be removed when you can breathe better on your own. Heart trouble. Lung trouble. Emergence delirium. This is when you feel confused while the sedation wears off. This gets better with time. What happens before the procedure? When to stop eating and drinking Follow instructions from your health care provider about what you may eat and drink. These may include: 8 hours before your procedure Stop eating most foods. Do not eat meat, fried foods, or fatty foods. Eat only light foods, such as toast or crackers. All liquids are okay except energy drinks and alcohol. 6 hours before your procedure Stop eating. Drink only clear liquids, such  as water, clear fruit juice, black coffee, plain tea, and sports drinks. Do not drink energy drinks or alcohol. 2 hours before your procedure Stop drinking all liquids. You may be allowed to take medicines with small sips of water. If you do not follow your health care provider's instructions, your procedure may be delayed or canceled. Medicines Ask your health care provider about: Changing or stopping your regular medicines. These include any diabetes medicines or blood thinners you take. Taking medicines such as aspirin and ibuprofen. These medicines can thin your blood. Do not take them unless your health care provider tells you to. Taking over-the-counter medicines, vitamins, herbs, and supplements. Tests and exams You may have an exam or testing. You may have a blood or urine sample taken. General instructions Do not use any products that contain nicotine or tobacco for at least 4 weeks before the procedure. These products include cigarettes, chewing tobacco, and vaping devices, such as e-cigarettes. If you need help quitting, ask your health care provider. If you will be going home right after the procedure, plan to have a responsible adult: Take you home from the hospital or clinic. You will not be allowed to drive. Care for you for the time you are told. What happens during the procedure?  You will be given the sedative. It may be given: As a pill you can take by mouth. It can also be put into the rectum. As a spray through the nose. As an injection into muscle. As an injection into a vein through an IV. You may be given oxygen as needed. Your blood pressure, heart rate, breathing rate, and blood oxygen level will be monitored during the procedure. The medical or dental procedure will be done. The procedure may vary among health care providers and hospitals. What happens after the procedure? Your blood pressure, heart rate, breathing rate, and blood oxygen level will be  monitored until you leave the hospital or clinic. You will get fluids through an IV as needed. Do not drive or operate machinery until your health care provider says that it is safe. This information is not intended to replace advice given to you by your health care provider. Make sure you discuss any questions you have with your health care provider. Document Revised: 05/14/2022 Document Reviewed: 05/14/2022 [employer]vier Patient Education  2024 [employer] Epidural Steroid Injection Patient Information  Description: The epidural space surrounds the nerves as  they exit the spinal cord.  In some patients, the nerves can be compressed and inflamed by a bulging disc or a tight spinal canal (spinal stenosis).  By injecting steroids into the epidural space, we can bring irritated nerves into direct contact with a potentially helpful medication.  These steroids act directly on the irritated nerves and can reduce swelling and inflammation which often leads to decreased pain.  Epidural steroids may be injected anywhere along the spine and from the neck to the low back depending upon the location of your pain.   After numbing the skin with local anesthetic (like Novocaine), a small needle is passed into the epidural space slowly.  You may experience a sensation of pressure while this is being done.  The entire block usually last less than 10 minutes.  Conditions which may be treated by epidural steroids:  Low back and leg pain Neck and arm pain Spinal stenosis Post-laminectomy syndrome Herpes zoster (shingles) pain Pain from compression fractures  Preparation for the injection:  Do not eat any solid food or dairy products within 8 hours of your appointment.  You may drink clear liquids up to 3 hours before appointment.  Clear liquids include water, black coffee, juice or soda.  No milk or cream please. You may take your regular medication, including pain medications, with a sip of water before your  appointment  Diabetics should hold regular insulin (if taken separately) and take 1/2 normal NPH dos the morning of the procedure.  Carry some sugar containing items with you to your appointment. A driver must accompany you and be prepared to drive you home after your procedure.  Bring all your current medications with your. An IV may be inserted and sedation may be given at the discretion of the physician.   A blood pressure cuff, EKG and other monitors will often be applied during the procedure.  Some patients may need to have extra oxygen administered for a short period. You will be asked to provide medical information, including your allergies, prior to the procedure.  We must know immediately if you are taking blood thinners (like Coumadin/Warfarin)  Or if you are allergic to IV iodine contrast (dye). We must know if you could possible be pregnant.  Possible side-effects: Bleeding from needle site Infection (rare, may require surgery) Nerve injury (rare) Numbness & tingling (temporary) Difficulty urinating (rare, temporary) Spinal headache ( a headache worse with upright posture) Light -headedness (temporary) Pain at injection site (several days) Decreased blood pressure (temporary) Weakness in arm/leg (temporary) Pressure sensation in back/neck (temporary)  Call if you experience: Fever/chills associated with headache or increased back/neck pain. Headache worsened by an upright position. New onset weakness or numbness of an extremity below the injection site Hives or difficulty breathing (go to the emergency room) Inflammation or drainage at the infection site Severe back/neck pain Any new symptoms which are concerning to you  Please note:  Although the local anesthetic injected can often make your back or neck feel good for several hours after the injection, the pain will likely return.  It takes 3-7 days for steroids to work in the epidural space.  You may not notice any pain  relief for at least that one week.  If effective, we will often do a series of three injections spaced 3-6 weeks apart to maximally decrease your pain.  After the initial series, we generally will wait several months before considering a repeat injection of the same type.  If you have any  questions, please call [phone] [facility]

## 2023-04-25 NOTE — Progress Notes (Signed)
Safety precautions to be maintained throughout the outpatient stay will include: orient to surroundings, keep bed in low position, maintain call bell within reach at all times, provide assistance with transfer out of bed and ambulation.  

## 2023-04-25 NOTE — Progress Notes (Signed)
PROVIDER NOTE: Information contained herein reflects review and annotations entered in association with encounter. Interpretation of such information and data should be left to medically-trained personnel. Information provided to patient can be located elsewhere in the medical record under "Patient Instructions". Document created using STT-dictation technology, any transcriptional errors that may result from process are unintentional.    Patient: Kristine [name]  Service Category: E/M  Provider: Edward Jolly, MD  DOB: Apr 26, 1971  DOS: 04/25/2023  Referring Provider: Enid Baas, MD  MRN: [id number]  Specialty: Interventional Pain Management  PCP: Enid Baas, MD  Type: Established Patient  Setting: Ambulatory outpatient    Location: Office  Delivery: Face-to-face     HPI  Kristine [name], a 52 y.o. year old female, is here today because of her Foraminal stenosis of lumbar region [M48.061]. Ms. Bey primary complain today is Back Pain   Pain Assessment: Severity of   is reported as a 2 /10. Location: Back Lower/down right leg to toes. Onset: More than a month ago. Quality: Aching, Burning, Throbbing. Timing: Constant. Modifying factor(s): heat, rest, meds. Vitals:  height is 5\' 2"  (1.575 m) and weight is 165 lb (74.8 kg). Her temporal temperature is 97.5 F (36.4 C) (abnormal). Her blood pressure is 122/75 and her pulse is 81. Her respiration is 17 and oxygen saturation is 94%.  BMI: Estimated body mass index is 30.18 kg/m as calculated from the following:   Height as of this encounter: 5\' 2"  (1.575 m).   Weight as of this encounter: 165 lb (74.8 kg). Last encounter: 03/25/2023. Last procedure: 02/25/2023.  Reason for encounter: patient-requested evaluation.   Patient presents today with increased low back pain with radiates down into her right lateral thigh to her right leg and toes.  Lumbar MRI from March 2023 shows mild bilateral foraminal stenosis at L3-L4. She states  that the radiating pain is bothersome for her and impacts her ADLs. Discussed lumbar epidural steroid injection, risk and benefits reviewed and patient would like to proceed.   ROS  Constitutional: Denies any fever or chills Gastrointestinal: No reported hemesis, hematochezia, vomiting, or acute GI distress Musculoskeletal:  Low back pain with radiation into right leg Neurological: No reported episodes of acute onset apraxia, aphasia, dysarthria, agnosia, amnesia, paralysis, loss of coordination, or loss of consciousness  Medication Review  ALPRAZolam, Cranberry, EPINEPHrine, Guselkumab, Multiple Vitamin, Semaglutide (2 MG/DOSE), Ubrogepant, albuterol, ascorbic acid, azelastine, cetirizine, cyanocobalamin, desvenlafaxine, hydrochlorothiazide, levocetirizine, levothyroxine, montelukast, nitrofurantoin (macrocrystal-monohydrate), omalizumab, ondansetron, and polyethylene glycol  History Review  Allergy: Ms. Regal is allergic to bee pollen, bee venom, egg-derived products, keflex [cephalexin], milk (cow), milk-related compounds, penicillins, red dye, shellfish allergy, soy allergy, tilactase, topamax [topiramate], wheat, yellow dyes (non-tartrazine), ferrous gluconate, gabapentin, hydrocodone, iron, butalbital-apap-caffeine, codeine, iodine, latex, meclizine, and robaxin [methocarbamol]. Drug: Ms. Zirkle  reports no history of drug use. Alcohol:  reports no history of alcohol use. Tobacco:  reports that she quit smoking about 16 years ago. Her smoking use included cigarettes. She has never used smokeless tobacco. Social: Ms. Brocious  reports that she quit smoking about 16 years ago. Her smoking use included cigarettes. She has never used smokeless tobacco. She reports that she does not drink alcohol and does not use drugs. Medical:  has a past medical history of Abnormal weight gain (08/05/2013), Absence of interventricular septum (08/05/2013), Allergic rhinitis (04/17/2016), Allergic urticaria due  to ingested food (02/09/2016), Anxiety, Asthma, Cancer (HCC), Clinical depression (09/30/2013), Complication of anesthesia, Depression, Dizziness, Excess weight (09/30/2013), GERD (gastroesophageal reflux disease),  Headache, migraine (09/30/2013), Heart murmur, Heavy drinker (04/11/2014), History of methicillin resistant staphylococcus aureus (MRSA) (11/2015), Hypothyroidism, Infection of urinary tract (09/30/2013), Irregular bleeding (09/30/2013), Multiple allergies, Pneumonia, Pre-diabetes, and VSD (ventricular septal defect and aortic arch hypoplasia. Surgical: Ms. Kritikos  has a past surgical history that includes Nasal sinus surgery; Colonoscopy (2006); Cystoscopy with hydrodistension and biopsy; Cesarean section; Laparoscopic assisted vaginal hysterectomy (N/A, 08/20/2016); Laparoscopic bilateral salpingectomy (Bilateral, 08/20/2016); Coronary angioplasty (2013); Cystoscopy (N/A, 10/23/2016); Botox injection (N/A, 10/23/2016); Colonoscopy with propofol (N/A, 07/04/2020); Breast biopsy (Left, 11/13/2013); Cosmetic surgery (06/09/2021); Tubal ligation (2003); Blepharoplasty (2019); Combined reduction mammaplasty w/ abdominoplasty (Bilateral, 05/2021); Esophagogastroduodenoscopy; Tonsillectomy; Anterior cervical decomp/discectomy fusion (N/A, 02/09/2022); Reduction mammaplasty (Bilateral, 06/11/2021); Cervical fusion (2023); and Knee surgery (Right, 03/20/2023). Family: family history includes Alcohol abuse in her mother; Aneurysm in her mother; Anxiety disorder in her father and mother; Bipolar disorder in her paternal aunt; Breast cancer in her maternal aunt, maternal grandmother, paternal aunt, paternal aunt, and paternal grandmother; Cancer in her father; Depression in her father and mother; Drug abuse in her mother; Kidney cancer in her maternal uncle; Kidney disease in an other family member; Liver disease in her mother; Prostate cancer in her paternal grandfather.  Laboratory Chemistry Profile    Renal Lab Results  Component Value Date   BUN 17 09/19/2017   CREATININE 0.85 09/19/2017   BCR 27 (H) 04/17/2016   GFRAA >60 09/19/2017   GFRNONAA >60 09/19/2017    Hepatic Lab Results  Component Value Date   AST 27 09/04/2016   ALT 32 09/04/2016   ALBUMIN 4.1 09/04/2016   ALKPHOS 44 09/04/2016    Electrolytes Lab Results  Component Value Date   NA 137 09/19/2017   K 3.6 02/05/2022   CL 96 (L) 09/19/2017   CALCIUM 9.1 09/19/2017   MG 2.1 04/09/2017    Bone No results found for: "VD25OH", "VD125OH2TOT", "WU9811BJ4", "NW2956OZ3", "25OHVITD1", "25OHVITD2", "25OHVITD3", "TESTOFREE", "TESTOSTERONE"  Inflammation (CRP: Acute Phase) (ESR: Chronic Phase) Lab Results  Component Value Date   ESRSEDRATE 11 09/04/2016         Note: Above Lab results reviewed.  Recent Imaging Review  DG PAIN CLIN[facility] C-ARM 1-60 MIN NO REPORT Fluoro was used, but no Radiologist interpretation will be provided.  Please refer to "NOTES" tab for provider progress note. Note: Reviewed        Physical Exam  General appearance: Well nourished, well developed, and well hydrated. In no apparent acute distress Mental status: Alert, oriented x 3 (person, place, & time)       Respiratory: No evidence of acute respiratory distress Eyes: PERLA Vitals: BP 122/75   Pulse 81   Temp (!) 97.5 F (36.4 C) (Temporal)   Resp 17   Ht 5\' 2"  (1.575 m)   Wt 165 lb (74.8 kg)   LMP 07/17/2016 (Approximate)   SpO2 94%   BMI 30.18 kg/m  BMI: Estimated body mass index is 30.18 kg/m as calculated from the following:   Height as of this encounter: 5\' 2"  (1.575 m).   Weight as of this encounter: 165 lb (74.8 kg). Ideal: Ideal body weight: 50.1 kg (110 lb 7.2 oz) Adjusted ideal body weight: 60 kg (132 lb 4.3 oz)  Low back pain with radiation into right leg  Assessment   Diagnosis Status  1. Foraminal stenosis of lumbar region   2. Chronic radicular lumbar pain   3. Neuroforaminal stenosis of cervical spine  (C5/6)   4. Chronic pain syndrome    Having a Flare-up  Having a Flare-up Controlled   Updated Problems: Problem  Chronic Radicular Lumbar Pain  Neuroforaminal Stenosis of Cervical Spine    Plan of Care  Plan for right L3-L4 ESI.   Orders:  Orders Placed This Encounter  Procedures   Lumbar Epidural Injection    Standing Status:   Future    Standing Expiration Date:   07/26/2023    Scheduling Instructions:     Procedure: Interlaminar Lumbar Epidural Steroid injection (LESI)            Laterality: Midline     Sedation: PO Valium     Timeframe: ASAA    Order Specific Question:   Where will this procedure be performed?    Answer:   [facility]   Follow-up plan:   Return in about 18 days (around 05/13/2023) for Right L3/4 ESI , in clinic (PO Valium).      C7/T1 ESI 10/16/21 (3 cc), 12/18/21 (4 cc); B/L L2,3,4,5, MBNB     Recent Visits Date Type Provider Dept  03/25/23 Office Visit Edward Jolly, MD [facility]  02/25/23 Procedure visit Edward Jolly, MD [facility]  02/05/23 Office Visit Edward Jolly, MD [facility]  Showing recent visits within past 90 days and meeting all other requirements Today's Visits Date Type Provider Dept  04/25/23 Office Visit Edward Jolly, MD [facility]  Showing today's visits and meeting all other requirements Future Appointments No visits were found meeting these conditions. Showing future appointments within next 90 days and meeting all other requirements  I discussed the assessment and treatment plan with the patient. The patient was provided an opportunity to ask questions and all were answered. The patient agreed with the plan and demonstrated an understanding of the instructions.  Patient advised to call back or seek an in-person evaluation if the symptoms or condition worsens.  Duration of encounter: .  Total time on encounter, as per AMA guidelines included both the  face-to-face and non-face-to-face time personally spent by the physician and/or other qualified health care professional(s) on the day of the encounter (includes time in activities that require the physician or other qualified health care professional and does not include time in activities normally performed by clinical staff). Physician's time may include the following activities when performed: Preparing to see the patient (e.g., pre-charting review of records, searching for previously ordered imaging, lab work, and nerve conduction tests) Review of prior analgesic pharmacotherapies. Reviewing PMP Interpreting ordered tests (e.g., lab work, imaging, nerve conduction tests) Performing post-procedure evaluations, including interpretation of diagnostic procedures Obtaining and/or reviewing separately obtained history Performing a medically appropriate examination and/or evaluation Counseling and educating the patient/family/caregiver Ordering medications, tests, or procedures Referring and communicating with other health care professionals (when not separately reported) Documenting clinical information in the electronic or other health record Independently interpreting results (not separately reported) and communicating results to the patient/ family/caregiver Care coordination (not separately reported)  Note by: Edward Jolly, MD Date: 04/25/2023; Time: 9:39 AM

## 2023-04-26 ENCOUNTER — Ambulatory Visit: Payer: BC Managed Care – PPO | Admitting: Orthopedic Surgery

## 2023-05-20 ENCOUNTER — Encounter: Payer: Self-pay | Admitting: Student in an Organized Health Care Education/Training Program

## 2023-05-20 ENCOUNTER — Ambulatory Visit: Payer: BC Managed Care – PPO | Admitting: Student in an Organized Health Care Education/Training Program

## 2023-05-20 ENCOUNTER — Ambulatory Visit
Admission: RE | Admit: 2023-05-20 | Discharge: 2023-05-20 | Disposition: A | Discharge status: Home / Self Care | Payer: BC Managed Care – PPO | Source: Ambulatory Visit | Attending: Student in an Organized Health Care Education/Training Program | Admitting: Student in an Organized Health Care Education/Training Program

## 2023-05-20 DIAGNOSIS — G8929 Other chronic pain: Secondary | ICD-10-CM | POA: Diagnosis present

## 2023-05-20 DIAGNOSIS — M48061 Spinal stenosis, lumbar region without neurogenic claudication: Secondary | ICD-10-CM

## 2023-05-20 DIAGNOSIS — M5416 Radiculopathy, lumbar region: Secondary | ICD-10-CM | POA: Diagnosis present

## 2023-05-20 DIAGNOSIS — M4802 Spinal stenosis, cervical region: Secondary | ICD-10-CM | POA: Insufficient documentation

## 2023-05-20 MED ORDER — DEXAMETHASONE SODIUM PHOSPHATE 10 MG/ML IJ SOLN
10.0000 mg | Freq: Once | INTRAMUSCULAR | Status: AC
  Administered 2023-05-20: 10 mg
  Filled 2023-05-20: qty 1

## 2023-05-20 MED ORDER — SODIUM CHLORIDE (PF) 0.9 % IJ SOLN
INTRAMUSCULAR | Status: AC
  Filled 2023-05-20: qty 10

## 2023-05-20 MED ORDER — SODIUM CHLORIDE 0.9% FLUSH
2.0000 mL | Freq: Once | INTRAVENOUS | Status: AC
  Administered 2023-05-20: 2 mL

## 2023-05-20 MED ORDER — LIDOCAINE HCL 2 % IJ SOLN
20.0000 mL | Freq: Once | INTRAMUSCULAR | Status: AC
  Administered 2023-05-20: 200 mg
  Filled 2023-05-20: qty 20

## 2023-05-20 MED ORDER — IOHEXOL 180 MG/ML  SOLN
10.0000 mL | Freq: Once | INTRAMUSCULAR | Status: AC
  Administered 2023-05-20: 10 mL via EPIDURAL
  Filled 2023-05-20: qty 20

## 2023-05-20 MED ORDER — DIAZEPAM 5 MG PO TABS
ORAL_TABLET | ORAL | Status: AC
  Filled 2023-05-20: qty 1

## 2023-05-20 MED ORDER — ROPIVACAINE HCL 2 MG/ML IJ SOLN
2.0000 mL | Freq: Once | INTRAMUSCULAR | Status: AC
  Administered 2023-05-20: 2 mL via EPIDURAL
  Filled 2023-05-20: qty 20

## 2023-05-20 MED ORDER — DIAZEPAM 5 MG PO TABS
5.0000 mg | ORAL_TABLET | ORAL | Status: AC
  Administered 2023-05-20: 5 mg via ORAL

## 2023-05-20 NOTE — Progress Notes (Signed)
PROVIDER NOTE: Interpretation of information contained herein should be left to medically-trained personnel. Specific patient instructions are provided elsewhere under "Patient Instructions" section of medical record. This document was created in part using STT-dictation technology, any transcriptional errors that may result from this process are unintentional.  Patient: Kristine [name] Type: Established DOB: Aug 10, 1971 MRN: [id number] PCP: Enid Baas, MD  Service: Procedure DOS: 05/20/2023 Setting: Ambulatory Location: Ambulatory outpatient facility Delivery: Face-to-face Provider: Edward Jolly, MD Specialty: Interventional Pain Management Specialty designation: 09 Location: Outpatient facility Ref. Prov.: Edward Jolly, MD       Interventional Therapy   Procedure: Lumbar epidural steroid injection (LESI) (interlaminar) #1    Laterality: Right   Level:  L3-4 Level.  Imaging: Fluoroscopic guidance         Anesthesia: Local anesthesia (1-2% Lidocaine) Anxiolysis: 5 mg PO Valium DOS: 05/20/2023  Performed by: Edward Jolly, MD  Purpose: Diagnostic/Therapeutic Indications: Lumbar radicular pain of intraspinal etiology of more than 4 weeks that has failed to respond to conservative therapy and is severe enough to impact quality of life or function. 1. Foraminal stenosis of lumbar region   2. Neuroforaminal stenosis of cervical spine (C5/6)   3. Chronic radicular lumbar pain    NAS-11 Pain score:   Pre-procedure: 7 /10   Post-procedure: 0-No pain/10      Position / Prep / Materials:  Position: Prone w/ head of the table raised (slight reverse trendelenburg) to facilitate breathing.  Prep solution: DuraPrep (Iodine Povacrylex [0.7% available iodine] and Isopropyl Alcohol, 74% w/w) Prep Area: Entire Posterior Lumbar Region from lower scapular tip down to mid buttocks area and from flank to flank. Materials:  Tray: Epidural tray Needle(s):  Type: Epidural needle (Tuohy) Gauge  (G):  22 Length: Regular (3.5-in) Qty: 1   H&P (Pre-op Assessment):  Kristine [name] is a 52 y.o. (year old), female patient, seen today for interventional treatment. She  has a past surgical history that includes Nasal sinus surgery; Colonoscopy (2006); Cystoscopy with hydrodistension and biopsy; Cesarean section; Laparoscopic assisted vaginal hysterectomy (N/A, 08/20/2016); Laparoscopic bilateral salpingectomy (Bilateral, 08/20/2016); Coronary angioplasty (2013); Cystoscopy (N/A, 10/23/2016); Botox injection (N/A, 10/23/2016); Colonoscopy with propofol (N/A, 07/04/2020); Breast biopsy (Left, 11/13/2013); Cosmetic surgery (06/09/2021); Tubal ligation (2003); Blepharoplasty (2019); Combined reduction mammaplasty w/ abdominoplasty (Bilateral, 05/2021); Esophagogastroduodenoscopy; Tonsillectomy; Anterior cervical decomp/discectomy fusion (N/A, 02/09/2022); Reduction mammaplasty (Bilateral, 06/11/2021); Cervical fusion (2023); and Knee surgery (Right, 03/20/2023). Kristine [name] has a current medication list which includes the following prescription(s): albuterol, alprazolam, cetirizine, desvenlafaxine, divalproex, epinephrine, guselkumab, hydrochlorothiazide, levocetirizine, levothyroxine, montelukast, multiple vitamin, nitrofurantoin (macrocrystal-monohydrate), omalizumab, ondansetron, ubrelvy, cyanocobalamin, ascorbic acid, azelastine, cranberry, polyethylene glycol, and ozempic (2 mg/dose). Her primarily concern today is the Back Pain  Initial Vital Signs:  Pulse/HCG Rate: 84ECG Heart Rate: 88 Temp: (!) 97.2 F (36.2 C) Resp: 17 BP: 130/64 SpO2: 98 %  BMI: Estimated body mass index is 30.18 kg/m as calculated from the following:   Height as of this encounter: 5\' 2"  (1.575 m).   Weight as of this encounter: 165 lb (74.8 kg).  Risk Assessment: Allergies: Reviewed. She is allergic to bee pollen, bee venom, egg-derived products, keflex [cephalexin], milk (cow), milk-related compounds, penicillins, red dye,  shellfish allergy, soy allergy, tilactase, topamax [topiramate], wheat, yellow dyes (non-tartrazine), ferrous gluconate, gabapentin, hydrocodone, iron, butalbital-apap-caffeine, codeine, iodine, latex, meclizine, and robaxin [methocarbamol].  Allergy Precautions: None required Coagulopathies: Reviewed. None identified.  Blood-thinner therapy: None at this time Active Infection(s): Reviewed. None identified. Kristine [name] is afebrile  Site Confirmation: Kristine [name] was  asked to confirm the procedure and laterality before marking the site Procedure checklist: Completed Consent: Before the procedure and under the influence of no sedative(s), amnesic(s), or anxiolytics, the patient was informed of the treatment options, risks and possible complications. To fulfill our ethical and legal obligations, as recommended by the American Medical Association's Code of Ethics, I have informed the patient of my clinical impression; the nature and purpose of the treatment or procedure; the risks, benefits, and possible complications of the intervention; the alternatives, including doing nothing; the risk(s) and benefit(s) of the alternative treatment(s) or procedure(s); and the risk(s) and benefit(s) of doing nothing. The patient was provided information about the general risks and possible complications associated with the procedure. These may include, but are not limited to: failure to achieve desired goals, infection, bleeding, organ or nerve damage, allergic reactions, paralysis, and death. In addition, the patient was informed of those risks and complications associated to Spine-related procedures, such as failure to decrease pain; infection (i.e.: Meningitis, epidural or intraspinal abscess); bleeding (i.e.: epidural hematoma, subarachnoid hemorrhage, or any other type of intraspinal or peri-dural bleeding); organ or nerve damage (i.e.: Any type of peripheral nerve, nerve root, or spinal cord injury) with subsequent  damage to sensory, motor, and/or autonomic systems, resulting in permanent pain, numbness, and/or weakness of one or several areas of the body; allergic reactions; (i.e.: anaphylactic reaction); and/or death. Furthermore, the patient was informed of those risks and complications associated with the medications. These include, but are not limited to: allergic reactions (i.e.: anaphylactic or anaphylactoid reaction(s)); adrenal axis suppression; blood sugar elevation that in diabetics may result in ketoacidosis or comma; water retention that in patients with history of congestive heart failure may result in shortness of breath, pulmonary edema, and decompensation with resultant heart failure; weight gain; swelling or edema; medication-induced neural toxicity; particulate matter embolism and blood vessel occlusion with resultant organ, and/or nervous system infarction; and/or aseptic necrosis of one or more joints. Finally, the patient was informed that Medicine is not an exact science; therefore, there is also the possibility of unforeseen or unpredictable risks and/or possible complications that may result in a catastrophic outcome. The patient indicated having understood very clearly. We have given the patient no guarantees and we have made no promises. Enough time was given to the patient to ask questions, all of which were answered to the patient's satisfaction. Ms. Tack has indicated that she wanted to continue with the procedure. Attestation: I, the ordering provider, attest that I have discussed with the patient the benefits, risks, side-effects, alternatives, likelihood of achieving goals, and potential problems during recovery for the procedure that I have provided informed consent. Date  Time: 05/20/2023 10:37 AM   Pre-Procedure Preparation:  Monitoring: As per clinic protocol. Respiration, ETCO2, SpO2, BP, heart rate and rhythm monitor placed and checked for adequate function Safety Precautions:  Patient was assessed for positional comfort and pressure points before starting the procedure. Time-out: I initiated and conducted the "Time-out" before starting the procedure, as per protocol. The patient was asked to participate by confirming the accuracy of the "Time Out" information. Verification of the correct person, site, and procedure were performed and confirmed by me, the nursing staff, and the patient. "Time-out" conducted as per Joint Commission's Universal Protocol (UP.01.01.01). Time: 1125 Start Time: 1125 hrs.  Description/Narrative of Procedure:          Target: Epidural space via interlaminar opening, initially targeting the lower laminar border of the superior vertebral body. Region: Lumbar Approach:  Percutaneous paravertebral  Rationale (medical necessity): procedure needed and proper for the diagnosis and/or treatment of the patient's medical symptoms and needs. Procedural Technique Safety Precautions: Aspiration looking for blood return was conducted prior to all injections. At no point did we inject any substances, as a needle was being advanced. No attempts were made at seeking any paresthesias. Safe injection practices and needle disposal techniques used. Medications properly checked for expiration dates. SDV (single dose vial) medications used. Description of the Procedure: Protocol guidelines were followed. The procedure needle was introduced through the skin, ipsilateral to the reported pain, and advanced to the target area. Bone was contacted and the needle walked caudad, until the lamina was cleared. The epidural space was identified using "loss-of-resistance technique" with 2-3 ml of PF-NaCl (0.9% NSS), in a 5cc LOR glass syringe.  5 cc solution made of 2 cc of preservative-free saline, 2 cc of 0.2% ropivacaine, 1 cc of Decadron 10 mg/cc.   Vitals:   05/20/23 1112 05/20/23 1114 05/20/23 1118 05/20/23 1130  BP: (!) 146/117 133/83 139/73 112/77  Pulse:      Resp: (!)  21 11 14 15   Temp:      TempSrc:      SpO2: 100% 99% 98% 99%  Weight:      Height:        Start Time: 1125 hrs. End Time: 1128 hrs.  Imaging Guidance (Spinal):          Type of Imaging Technique: Fluoroscopy Guidance (Spinal) Indication(s): Assistance in needle guidance and placement for procedures requiring needle placement in or near specific anatomical locations not easily accessible without such assistance. Exposure Time: Please see nurses notes. Contrast: Before injecting any contrast, we confirmed that the patient did not have an allergy to iodine, shellfish, or radiological contrast. Once satisfactory needle placement was completed at the desired level, radiological contrast was injected. Contrast injected under live fluoroscopy. No contrast complications. See chart for type and volume of contrast used. Fluoroscopic Guidance: I was personally present during the use of fluoroscopy. "Tunnel Vision Technique" used to obtain the best possible view of the target area. Parallax error corrected before commencing the procedure. "Direction-depth-direction" technique used to introduce the needle under continuous pulsed fluoroscopy. Once target was reached, antero-posterior, oblique, and lateral fluoroscopic projection used confirm needle placement in all planes. Images permanently stored in EMR. Interpretation: I personally interpreted the imaging intraoperatively. Adequate needle placement confirmed in multiple planes. Appropriate spread of contrast into desired area was observed. No evidence of afferent or efferent intravascular uptake. No intrathecal or subarachnoid spread observed. Permanent images saved into the patient's record.  Antibiotic Prophylaxis:   Anti-infectives (From admission, onward)    None      Indication(s): None identified  Post-operative Assessment:  Post-procedure Vital Signs:  Pulse/HCG Rate: 8472 Temp: (!) 97.2 F (36.2 C) Resp: 15 BP: 112/77 SpO2: 99  %  EBL: None  Complications: No immediate post-treatment complications observed by team, or reported by patient.  Note: The patient tolerated the entire procedure well. A repeat set of vitals were taken after the procedure and the patient was kept under observation following institutional policy, for this type of procedure. Post-procedural neurological assessment was performed, showing return to baseline, prior to discharge. The patient was provided with post-procedure discharge instructions, including a section on how to identify potential problems. Should any problems arise concerning this procedure, the patient was given instructions to immediately contact us, at any time, without hesitation. In any case, we plan to  contact the patient by telephone for a follow-up status report regarding this interventional procedure.  Comments:  No additional relevant information.  Plan of Care (POC)  Orders:  Orders Placed This Encounter  Procedures   DG PAIN CLINIC C-ARM 1-60 MIN NO REPORT    Intraoperative interpretation by procedural physician at [facility].    Standing Status:   Standing    Number of Occurrences:   1    Order Specific Question:   Reason for exam:    Answer:   Assistance in needle guidance and placement for procedures requiring needle placement in or near specific anatomical locations not easily accessible without such assistance.    Medications ordered for procedure: Meds ordered this encounter  Medications   iohexol (OMNIPAQUE) 180 MG/ML injection 10 mL    Must be Myelogram-compatible. If not available, you may substitute with a water-soluble, non-ionic, hypoallergenic, myelogram-compatible radiological contrast medium.   lidocaine (XYLOCAINE) 2 % (with pres) injection 400 mg   diazepam (VALIUM) tablet 5 mg    Make sure Flumazenil is available in the pyxis when using this medication. If oversedation occurs, administer 0.2 mg IV over 15 sec. If after 45 sec no  response, administer 0.2 mg again over 1 min; may repeat at 1 min intervals; not to exceed 4 doses (1 mg)   sodium chloride flush (NS) 0.9 % injection 2 mL   ropivacaine (PF) 2 mg/mL (0.2%) (NAROPIN) injection 2 mL   dexamethasone (DECADRON) injection 10 mg   Medications administered: We administered iohexol, lidocaine, diazepam, sodium chloride flush, ropivacaine (PF) 2 mg/mL (0.2%), and dexamethasone.  See the medical record for exact dosing, route, and time of administration.  Follow-up plan:   Return in about 6 weeks (around 07/01/2023) for Post Procedure Evaluation, in person.       C7/T1 ESI 10/16/21 (3 cc), 12/18/21 (4 cc); B/L L2,3,4,5, MBNB, Right L3/4 ESI 05/20/23      Recent Visits Date Type Provider Dept  04/25/23 Office Visit Edward Jolly, MD [facility]  03/25/23 Office Visit Edward Jolly, MD [facility]  02/25/23 Procedure visit Edward Jolly, MD [facility]  Showing recent visits within past 90 days and meeting all other requirements Today's Visits Date Type Provider Dept  05/20/23 Procedure visit Edward Jolly, MD [facility]  Showing today's visits and meeting all other requirements Future Appointments Date Type Provider Dept  06/26/23 Appointment Edward Jolly, MD [facility]  Showing future appointments within next 90 days and meeting all other requirements  Disposition: Discharge home  Discharge (Date  Time): 05/20/2023; 1133 hrs.   Primary Care Physician: Enid Baas, MD Location: [name] Note by: Edward Jolly, MD (TTS technology used. I apologize for any typographical errors that were not detected and corrected.) Date: 05/20/2023; Time: 11:54 AM  Disclaimer:  Medicine is not an Visual merchandiser. The only guarantee in medicine is that nothing is guaranteed. It is important to note that the decision to proceed with this intervention was based on the information collected from  the patient. The Data and conclusions were drawn from the patient's questionnaire, the interview, and the physical examination. Because the information was provided in large part by the patient, it cannot be guaranteed that it has not been purposely or unconsciously manipulated. Every effort has been made to obtain as much relevant data as possible for this evaluation. It is important to note that the conclusions that lead to this procedure are derived in large  part from the available data. Always take into account that the treatment will also be dependent on availability of resources and existing treatment guidelines, considered by other Pain Management Practitioners as being common knowledge and practice, at the time of the intervention. For Medico-Legal purposes, it is also important to point out that variation in procedural techniques and pharmacological choices are the acceptable norm. The indications, contraindications, technique, and results of the above procedure should only be interpreted and judged by a Board-Certified Interventional Pain Specialist with extensive familiarity and expertise in the same exact procedure and technique.

## 2023-05-20 NOTE — Progress Notes (Signed)
Safety precautions to be maintained throughout the outpatient stay will include: orient to surroundings, keep bed in low position, maintain call bell within reach at all times, provide assistance with transfer out of bed and ambulation.  

## 2023-05-20 NOTE — Patient Instructions (Signed)

## 2023-05-21 ENCOUNTER — Telehealth: Payer: Self-pay | Admitting: Student in an Organized Health Care Education/Training Program

## 2023-05-21 ENCOUNTER — Telehealth: Payer: Self-pay | Admitting: *Deleted

## 2023-05-21 NOTE — Telephone Encounter (Signed)
Spoke with patient, reports she has insatiable hunger, extreme emotions. I informed her that this may be effects of steroids, possibly more extreme that usual, that symptoms may last up to 2 weeks. Patient was reassured, will document all symptoms on her post procedure diary.

## 2023-05-21 NOTE — Telephone Encounter (Signed)
PT states that since the procedure on yesterday. PT states that the pain started back once she left yesterday and hasn't gotta better. Pain is worst than before. Please give patient a call. TY

## 2023-05-21 NOTE — Telephone Encounter (Signed)
Attempted to call for post procedure follow-up. Message left. 

## 2023-05-21 NOTE — Telephone Encounter (Signed)
Attempted to call patient, message left. 

## 2023-05-29 ENCOUNTER — Ambulatory Visit: Payer: BC Managed Care – PPO | Admitting: Dermatology

## 2023-06-05 ENCOUNTER — Encounter: Payer: Self-pay | Admitting: Student in an Organized Health Care Education/Training Program

## 2023-06-10 ENCOUNTER — Telehealth: Payer: Self-pay

## 2023-06-10 NOTE — Telephone Encounter (Signed)
Spoke with patient and she states she can't take Lyrica or Gabapentin because it makes her sick.  States she can't take a steroid pack because she just took one for fire ant bites.  Statesw she is going to [facility] today to get gel shots in her knees. Just FYI.

## 2023-06-11 ENCOUNTER — Telehealth: Payer: Self-pay | Admitting: Student in an Organized Health Care Education/Training Program

## 2023-06-11 NOTE — Telephone Encounter (Signed)
PT called stated that she went to [facility] on yesterday got two injections done. Last night patient stated that she fell in the floor. PT stated that once she got up her lower back and shoulder hurts. PT stated that she fell flat down, and is really in pain and wants to let doctor know. Please give patient a call. TY

## 2023-06-11 NOTE — Telephone Encounter (Signed)
Patient advised.

## 2023-06-17 ENCOUNTER — Ambulatory Visit: Payer: BC Managed Care – PPO | Admitting: Dermatology

## 2023-06-25 DIAGNOSIS — I2510 Atherosclerotic heart disease of native coronary artery without angina pectoris: Secondary | ICD-10-CM

## 2023-06-25 HISTORY — DX: Atherosclerotic heart disease of native coronary artery without angina pectoris: I25.10

## 2023-06-26 ENCOUNTER — Other Ambulatory Visit: Payer: Self-pay | Admitting: Student in an Organized Health Care Education/Training Program

## 2023-06-26 ENCOUNTER — Encounter: Payer: Self-pay | Admitting: Student in an Organized Health Care Education/Training Program

## 2023-06-26 ENCOUNTER — Ambulatory Visit (HOSPITAL_BASED_OUTPATIENT_CLINIC_OR_DEPARTMENT_OTHER): Payer: BC Managed Care – PPO | Admitting: Student in an Organized Health Care Education/Training Program

## 2023-06-26 ENCOUNTER — Ambulatory Visit
Admission: RE | Admit: 2023-06-26 | Discharge: 2023-06-26 | Disposition: A | Discharge status: Home / Self Care | Payer: BC Managed Care – PPO | Source: Ambulatory Visit | Attending: Student in an Organized Health Care Education/Training Program | Admitting: Student in an Organized Health Care Education/Training Program

## 2023-06-26 ENCOUNTER — Ambulatory Visit: Admission: RE | Admit: 2023-06-26 | Payer: BC Managed Care – PPO | Source: Ambulatory Visit | Admitting: *Deleted

## 2023-06-26 VITALS — BP 132/73 | HR 97 | Temp 97.1°F | Resp 16 | Ht 62.0 in | Wt 180.0 lb

## 2023-06-26 DIAGNOSIS — M25551 Pain in right hip: Secondary | ICD-10-CM

## 2023-06-26 DIAGNOSIS — M47818 Spondylosis without myelopathy or radiculopathy, sacral and sacrococcygeal region: Secondary | ICD-10-CM | POA: Insufficient documentation

## 2023-06-26 DIAGNOSIS — M25552 Pain in left hip: Secondary | ICD-10-CM | POA: Insufficient documentation

## 2023-06-26 DIAGNOSIS — M533 Sacrococcygeal disorders, not elsewhere classified: Secondary | ICD-10-CM

## 2023-06-26 DIAGNOSIS — G5703 Lesion of sciatic nerve, bilateral lower limbs: Secondary | ICD-10-CM

## 2023-06-26 DIAGNOSIS — M461 Sacroiliitis, not elsewhere classified: Secondary | ICD-10-CM

## 2023-06-26 NOTE — Progress Notes (Signed)
PROVIDER NOTE: Information contained herein reflects review and annotations entered in association with encounter. Interpretation of such information and data should be left to medically-trained personnel. Information provided to patient can be located elsewhere in the medical record under "Patient Instructions". Document created using STT-dictation technology, any transcriptional errors that may result from process are unintentional.    Patient: Kristine [name]  Service Category: E/M  Provider: Edward Jolly, MD  DOB: 1970-12-09  DOS: 06/26/2023  Referring Provider: Enid Baas, MD  MRN: [id number]  Specialty: Interventional Pain Management  PCP: Enid Baas, MD  Type: Established Patient  Setting: Ambulatory outpatient    Location: Office  Delivery: Face-to-face     HPI  Kristine [name], a 52 y.o. year old female, is here today because of her Sacroiliac joint pain [M53.3]. Ms. Delasancha primary complain today is Back Pain  Pain Assessment: Severity of Chronic pain is reported as a 8 /10. Location: Back Lower/down legs bilat to toes. Onset: More than a month ago. Quality: Aching, Burning, Throbbing. Timing: Constant. Modifying factor(s): heat, rest, meds, ice. Vitals:  height is 5\' 2"  (1.575 m) and weight is 180 lb (81.6 kg). Her temperature is 97.1 F (36.2 C) (abnormal). Her blood pressure is 132/73 and her pulse is 97. Her respiration is 16 and oxygen saturation is 96%.  BMI: Estimated body mass index is 32.92 kg/m as calculated from the following:   Height as of this encounter: 5\' 2"  (1.575 m).   Weight as of this encounter: 180 lb (81.6 kg). Last encounter: 04/25/2023. Last procedure: 05/20/2023.  Reason for encounter: patient-requested evaluation.   Patient presents today with increased lower back pain, bilateral SI joint pain and bilateral piriformis pain. She states that occasionally she will also have leg pain.  She states that she is doing much better after her right  knee surgery and has improved right knee range of motion.   ROS  Constitutional: Denies any fever or chills Gastrointestinal: No reported hemesis, hematochezia, vomiting, or acute GI distress Musculoskeletal:  Bilateral buttock, SI joint pain Neurological: No reported episodes of acute onset apraxia, aphasia, dysarthria, agnosia, amnesia, paralysis, loss of coordination, or loss of consciousness  Medication Review  ALPRAZolam, Cranberry, EPINEPHrine, Guselkumab, Multiple Vitamin, Semaglutide (2 MG/DOSE), Ubrogepant, albuterol, ascorbic acid, azelastine, cetirizine, cyanocobalamin, desvenlafaxine, divalproex, hydrochlorothiazide, levocetirizine, levothyroxine, lurasidone, montelukast, nitrofurantoin (macrocrystal-monohydrate), omalizumab, ondansetron, and polyethylene glycol  History Review  Allergy: Kristine [name] is allergic to bee pollen, bee venom, egg-derived products, keflex [cephalexin], milk (cow), milk-related compounds, penicillins, red dye #40 (allura red), shellfish allergy, soy allergy, tilactase, topamax [topiramate], wheat, yellow dyes (non-tartrazine), ferrous gluconate, gabapentin, hydrocodone, iron, butalbital-apap-caffeine, codeine, iodine, latex, meclizine, and robaxin [methocarbamol]. Drug: Kristine [name]  reports no history of drug use. Alcohol:  reports no history of alcohol use. Tobacco:  reports that she quit smoking about 16 years ago. Her smoking use included cigarettes. She has never used smokeless tobacco. Social: Kristine [name]  reports that she quit smoking about 16 years ago. Her smoking use included cigarettes. She has never used smokeless tobacco. She reports that she does not drink alcohol and does not use drugs. Medical:  has a past medical history of Abnormal weight gain (08/05/2013), Absence of interventricular septum (08/05/2013), Allergic rhinitis (04/17/2016), Allergic urticaria due to ingested food (02/09/2016), Anxiety, Asthma, Cancer (HCC), Clinical depression  (09/30/2013), Complication of anesthesia, Coronary artery disease (06/25/2023), Depression, Dizziness, Excess weight (09/30/2013), GERD (gastroesophageal reflux disease), Headache, migraine (09/30/2013), Heart murmur, Heavy drinker (04/11/2014), History of methicillin resistant staphylococcus  aureus (MRSA) (11/2015), Hypothyroidism, Infection of urinary tract (09/30/2013), Irregular bleeding (09/30/2013), Multiple allergies, Pneumonia, Pre-diabetes, and VSD (ventricular septal defect and aortic arch hypoplasia. Surgical: Kristine [name]  has a past surgical history that includes Nasal sinus surgery; Colonoscopy (2006); Cystoscopy with hydrodistension and biopsy; Cesarean section; Laparoscopic assisted vaginal hysterectomy (N/A, 08/20/2016); Laparoscopic bilateral salpingectomy (Bilateral, 08/20/2016); Coronary angioplasty (2013); Cystoscopy (N/A, 10/23/2016); Botox injection (N/A, 10/23/2016); Colonoscopy with propofol (N/A, 07/04/2020); Breast biopsy (Left, 11/13/2013); Cosmetic surgery (06/09/2021); Tubal ligation (2003); Blepharoplasty (2019); Combined reduction mammaplasty w/ abdominoplasty (Bilateral, 05/2021); Esophagogastroduodenoscopy; Tonsillectomy; Anterior cervical decomp/discectomy fusion (N/A, 02/09/2022); Reduction mammaplasty (Bilateral, 06/11/2021); Cervical fusion (2023); and Knee surgery (Right, 03/20/2023). Family: family history includes Alcohol abuse in her mother; Aneurysm in her mother; Anxiety disorder in her father and mother; Bipolar disorder in her paternal aunt; Breast cancer in her maternal aunt, maternal grandmother, paternal aunt, paternal aunt, and paternal grandmother; Cancer in her father; Depression in her father and mother; Drug abuse in her mother; Kidney cancer in her maternal uncle; Kidney disease in an other family member; Liver disease in her mother; Prostate cancer in her paternal grandfather.  Laboratory Chemistry Profile   Renal Lab Results  Component Value Date   BUN  17 09/19/2017   CREATININE 0.85 09/19/2017   BCR 27 (H) 04/17/2016   GFRAA >60 09/19/2017   GFRNONAA >60 09/19/2017    Hepatic Lab Results  Component Value Date   AST 27 09/04/2016   ALT 32 09/04/2016   ALBUMIN 4.1 09/04/2016   ALKPHOS 44 09/04/2016    Electrolytes Lab Results  Component Value Date   NA 137 09/19/2017   K 3.6 02/05/2022   CL 96 (L) 09/19/2017   CALCIUM 9.1 09/19/2017   MG 2.1 04/09/2017    Bone No results found for: "VD25OH", "VD125OH2TOT", "ZO1096EA5", "WU9811BJ4", "25OHVITD1", "25OHVITD2", "25OHVITD3", "TESTOFREE", "TESTOSTERONE"  Inflammation (CRP: Acute Phase) (ESR: Chronic Phase) Lab Results  Component Value Date   ESRSEDRATE 11 09/04/2016         Note: Above Lab results reviewed.   Physical Exam  General appearance: Well nourished, well developed, and well hydrated. In no apparent acute distress Mental status: Alert, oriented x 3 (person, place, & time)       Respiratory: No evidence of acute respiratory distress Eyes: PERLA Vitals: BP 132/73   Pulse 97   Temp (!) 97.1 F (36.2 C)   Resp 16   Ht 5\' 2"  (1.575 m)   Wt 180 lb (81.6 kg)   LMP 07/17/2016 (Approximate)   SpO2 96%   BMI 32.92 kg/m  BMI: Estimated body mass index is 32.92 kg/m as calculated from the following:   Height as of this encounter: 5\' 2"  (1.575 m).   Weight as of this encounter: 180 lb (81.6 kg). Ideal: Ideal body weight: 50.1 kg (110 lb 7.2 oz) Adjusted ideal body weight: 62.7 kg (138 lb 4.3 oz)  Lumbar Spine Area Exam  Skin & Axial Inspection: No masses, redness, or swelling Alignment: Symmetrical Functional ROM: Pain restricted ROM affecting both sides Stability: No instability detected Muscle Tone/Strength: Functionally intact. No obvious neuro-muscular anomalies detected. Sensory (Neurological): Referred pain pattern from SI-J and piriformis Palpation: No palpable anomalies       Provocative Tests: Hyperextension/rotation test: deferred today       Lumbar  quadrant test (Kemp's test): deferred today       Lateral bending test: deferred today       Patrick's Maneuver: (+) for bilateral S-I arthralgia FABER* test: (+) for  bilateral S-I arthralgia S-I anterior distraction/compression test: (+) for bilateral S-I arthralgia S-I lateral compression test: (+) for bilateral S-I arthralgia S-I Thigh-thrust test: (+) for bilateral S-I arthralgia S-I Gaenslen's test: (+) for bilateral S-I arthralgia *(Flexion, ABduction and External Rotation) Gait & Posture Assessment  Ambulation: Unassisted Gait: Relatively normal for age and body habitus Posture: WNL  Lower Extremity Exam    Side: Right lower extremity  Side: Left lower extremity  Stability: No instability observed          Stability: No instability observed          Skin & Extremity Inspection: Evidence of prior arthroplastic surgery  Skin & Extremity Inspection: Skin color, temperature, and hair growth are WNL. No peripheral edema or cyanosis. No masses, redness, swelling, asymmetry, or associated skin lesions. No contractures.  Functional ROM: Unrestricted ROM                  Functional ROM: Unrestricted ROM                  Muscle Tone/Strength: Functionally intact. No obvious neuro-muscular anomalies detected.  Muscle Tone/Strength: Functionally intact. No obvious neuro-muscular anomalies detected.  Sensory (Neurological): Unimpaired        Sensory (Neurological): Unimpaired        DTR: Patellar: deferred today Achilles: deferred today Plantar: deferred today  DTR: Patellar: deferred today Achilles: deferred today Plantar: deferred today  Palpation: No palpable anomalies  Palpation: No palpable anomalies    Assessment   Diagnosis Status  1. Sacroiliac joint pain   2. SI joint arthritis   3. Piriformis syndrome of both sides   4. Bilateral hip pain    Having a Flare-up Having a Flare-up Having a Flare-up   Updated Problems: Problem  Sacroiliac Joint Pain  Si Joint Arthritis   Piriformis Syndrome of Both Sides    Plan of Care   Orders:  Orders Placed This Encounter  Procedures   SACROILIAC JOINT INJECTION    Standing Status:   Future    Standing Expiration Date:   09/26/2023    Scheduling Instructions:     Side: Bilateral     Sedation: IV Versed     Timeframe: ASAP    Order Specific Question:   Where will this procedure be performed?    Answer:   [facility]   TRIGGER POINT INJECTION    Area: Buttocks region (gluteal area) Indications: Piriformis muscle pain; Bilateral (G57.03) piriformis-syndrome; piriformis muscle spasms (E45.409). CPT code: 81191    Scheduling Instructions:     Type: Myoneural block (TPI) of piriformis muscle.     Side:  B/L     Sedation: Patient's choice.     Timeframe:1.5 weeks    Order Specific Question:   Where will this procedure be performed?    Answer:   [facility]   DG Si Joints    Standing Status:   Future    Number of Occurrences:   1    Standing Expiration Date:   07/27/2023    Scheduling Instructions:     Please make sure that the patient understands that this needs to be done as soon as possible. Never have the patient do the imaging "just before the next appointment". Inform patient that having the imaging done within the [facility] will expedite the availability of the results and will provide      imaging availability to the requesting physician. In addition inform the patient that the imaging order  has an expiration date and will not be renewed if not done within the active period.    Order Specific Question:   Reason for Exam (SYMPTOM  OR DIAGNOSIS REQUIRED)    Answer:   Sacroiliac joint pain    Order Specific Question:   Is the patient pregnant?    Answer:   No    Order Specific Question:   Preferred imaging location?    Answer:   [facility]    Order Specific Question:   Call Results- Best Contact Number?    Answer:   [phone] [facility])    Order Specific Question:    Release to patient    Answer:   Immediate   DG HIP UNILAT W OR W/O PELVIS 2-3 VIEWS LEFT    Please describe any evidence of DJD, such as joint narrowing, asymmetry, cysts, or any anomalies in bone density, production, or erosion.    Standing Status:   Future    Number of Occurrences:   1    Standing Expiration Date:   07/27/2023    Scheduling Instructions:     Please make sure that the patient understands that this needs to be done as soon as possible. Never have the patient do the imaging "just before the next appointment". Inform patient that having the imaging done within the [facility] will expedite the availability of the results and will provide      imaging availability to the requesting physician. In addition inform the patient that the imaging order has an expiration date and will not be renewed if not done within the active period.    Order Specific Question:   Reason for Exam (SYMPTOM  OR DIAGNOSIS REQUIRED)    Answer:   Sacroiliac joint pain    Order Specific Question:   Is the patient pregnant?    Answer:   No    Order Specific Question:   Preferred imaging location?    Answer:   [facility]    Order Specific Question:   Call Results- Best Contact Number?    Answer:   [phone] [facility])    Order Specific Question:   Release to patient    Answer:   Immediate   Follow-up plan:   Return in about 12 days (around 07/08/2023) for B/L SI-J and Piriformis, in clinic IV Versed.      C7/T1 ESI 10/16/21 (3 cc), 12/18/21 (4 cc); B/L L2,3,4,5, MBNB, Right L3/4 ESI 05/20/23       Recent Visits Date Type Provider Dept  05/20/23 Procedure visit Edward Jolly, MD [facility]  04/25/23 Office Visit Edward Jolly, MD [facility]  Showing recent visits within past 90 days and meeting all other requirements Today's Visits Date Type Provider Dept  06/26/23 Office Visit Edward Jolly, MD [facility]  Showing today's visits and meeting all  other requirements Future Appointments No visits were found meeting these conditions. Showing future appointments within next 90 days and meeting all other requirements  I discussed the assessment and treatment plan with the patient. The patient was provided an opportunity to ask questions and all were answered. The patient agreed with the plan and demonstrated an understanding of the instructions.  Patient advised to call back or seek an in-person evaluation if the symptoms or condition worsens.  Duration of encounter: .  Total time on encounter, as per AMA guidelines included both the face-to-face and non-face-to-face time personally spent by the physician and/or other qualified health  care professional(s) on the day of the encounter (includes time in activities that require the physician or other qualified health care professional and does not include time in activities normally performed by clinical staff). Physician's time may include the following activities when performed: Preparing to see the patient (e.g., pre-charting review of records, searching for previously ordered imaging, lab work, and nerve conduction tests) Review of prior analgesic pharmacotherapies. Reviewing PMP Interpreting ordered tests (e.g., lab work, imaging, nerve conduction tests) Performing post-procedure evaluations, including interpretation of diagnostic procedures Obtaining and/or reviewing separately obtained history Performing a medically appropriate examination and/or evaluation Counseling and educating the patient/family/caregiver Ordering medications, tests, or procedures Referring and communicating with other health care professionals (when not separately reported) Documenting clinical information in the electronic or other health record Independently interpreting results (not separately reported) and communicating results to the patient/ family/caregiver Care coordination (not separately  reported)  Note by: Edward Jolly, MD Date: 06/26/2023; Time: 3:22 PM

## 2023-06-26 NOTE — Patient Instructions (Signed)
____________________________________________________________________________________________  General Risks and Possible Complications  Patient Responsibilities: It is important that you read this as it is part of your informed consent. It is our duty to inform you of the risks and possible complications associated with treatments offered to you. It is your responsibility as a patient to read this and to ask questions about anything that is not clear or that you believe was not covered in this document.  Patient's Rights: You have the right to refuse treatment. You also have the right to change your mind, even after initially having agreed to have the treatment done. However, under this last option, if you wait until the last second to change your mind, you may be charged for the materials used up to that point.  Introduction: Medicine is not an Visual merchandiser. Everything in Medicine, including the lack of treatment(s), carries the potential for danger, harm, or loss (which is by definition: Risk). In Medicine, a complication is a secondary problem, condition, or disease that can aggravate an already existing one. All treatments carry the risk of possible complications. The fact that a side effects or complications occurs, does not imply that the treatment was conducted incorrectly. It must be clearly understood that these can happen even when everything is done following the highest safety standards.  No treatment: You can choose not to proceed with the proposed treatment alternative. The "PRO(s)" would include: avoiding the risk of complications associated with the therapy. The "CON(s)" would include: not getting any of the treatment benefits. These benefits fall under one of three categories: diagnostic; therapeutic; and/or palliative. Diagnostic benefits include: getting information which can ultimately lead to improvement of the disease or symptom(s). Therapeutic benefits are those associated with  the successful treatment of the disease. Finally, palliative benefits are those related to the decrease of the primary symptoms, without necessarily curing the condition (example: decreasing the pain from a flare-up of a chronic condition, such as incurable terminal cancer).  General Risks and Complications: These are associated to most interventional treatments. They can occur alone, or in combination. They fall under one of the following six (6) categories: no benefit or worsening of symptoms; bleeding; infection; nerve damage; allergic reactions; and/or death. No benefits or worsening of symptoms: In Medicine there are no guarantees, only probabilities. No healthcare provider can ever guarantee that a medical treatment will work, they can only state the probability that it may. Furthermore, there is always the possibility that the condition may worsen, either directly, or indirectly, as a consequence of the treatment. Bleeding: This is more common if the patient is taking a blood thinner, either prescription or over the counter (example: Goody Powders, Fish oil, Aspirin, Garlic, etc.), or if suffering a condition associated with impaired coagulation (example: Hemophilia, cirrhosis of the liver, low platelet counts, etc.). However, even if you do not have one on these, it can still happen. If you have any of these conditions, or take one of these drugs, make sure to notify your treating physician. Infection: This is more common in patients with a compromised immune system, either due to disease (example: diabetes, cancer, human immunodeficiency virus [HIV], etc.), or due to medications or treatments (example: therapies used to treat cancer and rheumatological diseases). However, even if you do not have one on these, it can still happen. If you have any of these conditions, or take one of these drugs, make sure to notify your treating physician. Nerve Damage: This is more common when the treatment is an  invasive one, but it can also happen with the use of medications, such as those used in the treatment of cancer. The damage can occur to small secondary nerves, or to large primary ones, such as those in the spinal cord and brain. This damage may be temporary or permanent and it may lead to impairments that can range from temporary numbness to permanent paralysis and/or brain death. Allergic Reactions: Any time a substance or material comes in contact with our body, there is the possibility of an allergic reaction. These can range from a mild skin rash (contact dermatitis) to a severe systemic reaction (anaphylactic reaction), which can result in death. Death: In general, any medical intervention can result in death, most of the time due to an unforeseen complication. ____________________________________________________________________________________________    ______________________________________________________________________  Preparing for your procedure  Appointments: If you think you may not be able to keep your appointment, call 24-48 hours in advance to cancel. We need time to make it available to others.  During your procedure appointment there will be: No Prescription Refills. No disability issues to discussed. No medication changes or discussions.  Instructions: Food intake: Avoid eating anything solid for at least 8 hours prior to your procedure. Clear liquid intake: You may take clear liquids such as water up to 2 hours prior to your procedure. (No carbonated drinks. No soda.) Transportation: Unless otherwise stated by your physician, bring a driver. Morning Medicines: Except for blood thinners, take all of your other morning medications with a sip of water. Make sure to take your heart and blood pressure medicines. If your blood pressure's lower number is above 100, the case will be rescheduled. Blood thinners: Make sure to stop your blood thinners as instructed.  If you take  a blood thinner, but were not instructed to stop it, call our office [phone] and ask to talk to a nurse. Not stopping a blood thinner prior to certain procedures could lead to serious complications. Diabetics on insulin: Notify the staff so that you can be scheduled 1st case in the morning. If your diabetes requires high dose insulin, take only  of your normal insulin dose the morning of the procedure and notify the staff that you have done so. Preventing infections: Shower with an antibacterial soap the morning of your procedure.  Build-up your immune system: Take 1000 mg of Vitamin C with every meal (3 times a day) the day prior to your procedure. Antibiotics: Inform the nursing staff if you are taking any antibiotics or if you have any conditions that may require antibiotics prior to procedures. (Example: recent joint implants)   Pregnancy: If you are pregnant make sure to notify the nursing staff. Not doing so may result in injury to the fetus, including death.  Sickness: If you have a cold, fever, or any active infections, call and cancel or reschedule your procedure. Receiving steroids while having an infection may result in complications. Arrival: You must be in the facility at least 30 minutes prior to your scheduled procedure. Tardiness: Your scheduled time is also the cutoff time. If you do not arrive at least 15 minutes prior to your procedure, you will be rescheduled.  Children: Do not bring any children with you. Make arrangements to keep them home. Dress appropriately: There is always a possibility that your clothing may get soiled. Avoid long dresses. Valuables: Do not bring any jewelry or valuables.  Reasons to call and reschedule or cancel your procedure: (Following these recommendations will minimize the risk  of a serious complication.) Surgeries: Avoid having procedures within 2 weeks of any surgery. (Avoid for 2 weeks before or after any surgery). Flu Shots: Avoid having  procedures within 2 weeks of a flu shots or . (Avoid for 2 weeks before or after immunizations). Barium: Avoid having a procedure within 7-10 days after having had a radiological study involving the use of radiological contrast. (Myelograms, Barium swallow or enema study). Heart attacks: Avoid any elective procedures or surgeries for the initial 6 months after a "Myocardial Infarction" (Heart Attack). Blood thinners: It is imperative that you stop these medications before procedures. Let us know if you if you take any blood thinner.  Infection: Avoid procedures during or within two weeks of an infection (including chest colds or gastrointestinal problems). Symptoms associated with infections include: Localized redness, fever, chills, night sweats or profuse sweating, burning sensation when voiding, cough, congestion, stuffiness, runny nose, sore throat, diarrhea, nausea, vomiting, cold or Flu symptoms, recent or current infections. It is specially important if the infection is over the area that we intend to treat. Heart and lung problems: Symptoms that may suggest an active cardiopulmonary problem include: cough, chest pain, breathing difficulties or shortness of breath, dizziness, ankle swelling, uncontrolled high or unusually low blood pressure, and/or palpitations. If you are experiencing any of these symptoms, cancel your procedure and contact your primary care physician for an evaluation.  Remember:  Regular Business hours are:  Monday to Thursday 8:00 AM to 4:00 PM  Provider's Schedule: Delano Metz, MD:  Procedure days: Tuesday and Thursday 7:30 AM to 4:00 PM  Edward Jolly, MD:  Procedure days: Monday and Wednesday 7:30 AM to 4:00 PM  ______________________________________________________________________   Trigger Point Injection Trigger points are areas where you have pain. A trigger point injection is a shot given in the trigger point to help relieve pain for a few days to a few  months. Common places for trigger points include the neck, shoulders, upper back, or lower back. A trigger point injection will not cure long-term (chronic) pain permanently. These injections do not always work for every person. For some people, they can help to relieve pain for a few days to a few months. Tell a health care provider about: Any allergies you have. All medicines you are taking, including vitamins, herbs, eye drops, creams, and over-the-counter medicines. Any problems you or family members have had with anesthetic medicines. Any bleeding problems you have. Any surgeries you have had. Any medical conditions you have. Whether you are pregnant or may be pregnant. What are the risks? Generally, this is a safe procedure. However, problems may occur, including: Infection. Bleeding or bruising. Allergic reaction to the injected medicine. Irritation of the skin around the injection site. What happens before the procedure? Ask your health care provider about: Changing or stopping your regular medicines. This is especially important if you are taking diabetes medicines or blood thinners. Taking medicines such as aspirin and ibuprofen. These medicines can thin your blood. Do not take these medicines unless your health care provider tells you to take them. Taking over-the-counter medicines, vitamins, herbs, and supplements. What happens during the procedure?  Your health care provider will feel for trigger points. A marker may be used to circle the area for the injection. The skin over the trigger point will be washed with a germ-killing soap. You may be given a medicine to help you relax (sedative). A thin needle is used for the injection. You may feel pain or a twitching feeling  when the needle enters your skin. A numbing solution may be injected into the trigger point. Sometimes a medicine to keep down inflammation is also injected. Your health care provider may move the needle  around the area where the trigger point is located until the tightness and twitching goes away. After the injection, your health care provider may put gentle pressure over the injection site. The injection site will be covered with a bandage (dressing). The procedure may vary among health care providers and hospitals. What can I expect after treatment? After treatment, you may have soreness and stiffness for 1-2 days. Follow these instructions at home: Injection site care Remove your dressing in a few hours, or as told by your health care provider. Check your injection site every day for signs of infection. Check for: Redness, swelling, or pain. Fluid or blood. Warmth. Pus or a bad smell. Managing pain, stiffness, and swelling If directed, put ice on the affected area. To do this: Put ice in a plastic bag. Place a towel between your skin and the bag. Leave the ice on for 20 minutes, 2-3 times a day. Remove the ice if your skin turns bright red. This is very important. If you cannot feel pain, heat, or cold, you have a greater risk of damage to the area. Activity If you were given a sedative during the procedure, it can affect you for several hours. Do not drive or operate machinery until your health care provider says that it is safe. Do not take baths, swim, or use a hot tub until your health care provider approves. Return to your normal activities as told by your health care provider. Ask your health care provider what activities are safe for you. General instructions If you were asked to stop your regular medicines, ask your health care provider when you may start taking them again. You may be asked to see an occupational or physical therapist for exercises that reduce muscle strain and stretch the area of the trigger point. Keep all follow-up visits. This is important. Contact a health care provider if: Your pain comes back, and it is worse than before the injection. You may need  more injections. You have chills or a fever. The injection site becomes more painful, red, swollen, or warm to the touch. Summary A trigger point injection is a shot given in the trigger point to help relieve pain. Common places for trigger point injections are the neck, shoulders, upper back, and lower back. These injections do not always work for every person, but for some people, the injections can help to relieve pain for a few days to a few months. Contact a health care provider if symptoms come back or if they are worse than before treatment. Also, get help if the injection site becomes more painful, red, swollen, or warm to the touch. This information is not intended to replace advice given to you by your health care provider. Make sure you discuss any questions you have with your health care provider. Document Revised: 02/07/2021 Document Reviewed: 02/07/2021 [employer] Patient Education  2024 [employer].

## 2023-07-02 ENCOUNTER — Ambulatory Visit: Payer: BC Managed Care – PPO | Admitting: Orthopedic Surgery

## 2023-07-08 ENCOUNTER — Ambulatory Visit
Admission: RE | Admit: 2023-07-08 | Discharge: 2023-07-08 | Disposition: A | Discharge status: Home / Self Care | Payer: BC Managed Care – PPO | Source: Ambulatory Visit | Attending: Student in an Organized Health Care Education/Training Program | Admitting: Student in an Organized Health Care Education/Training Program

## 2023-07-08 ENCOUNTER — Encounter: Payer: Self-pay | Admitting: Student in an Organized Health Care Education/Training Program

## 2023-07-08 ENCOUNTER — Ambulatory Visit: Payer: BC Managed Care – PPO | Admitting: Student in an Organized Health Care Education/Training Program

## 2023-07-08 DIAGNOSIS — M47818 Spondylosis without myelopathy or radiculopathy, sacral and sacrococcygeal region: Secondary | ICD-10-CM | POA: Diagnosis not present

## 2023-07-08 DIAGNOSIS — G5700 Lesion of sciatic nerve, unspecified lower limb: Secondary | ICD-10-CM | POA: Insufficient documentation

## 2023-07-08 DIAGNOSIS — M461 Sacroiliitis, not elsewhere classified: Secondary | ICD-10-CM

## 2023-07-08 DIAGNOSIS — M533 Sacrococcygeal disorders, not elsewhere classified: Secondary | ICD-10-CM | POA: Diagnosis present

## 2023-07-08 DIAGNOSIS — G5703 Lesion of sciatic nerve, bilateral lower limbs: Secondary | ICD-10-CM | POA: Diagnosis not present

## 2023-07-08 MED ORDER — MIDAZOLAM HCL 2 MG/2ML IJ SOLN
0.5000 mg | Freq: Once | INTRAMUSCULAR | Status: AC
  Administered 2023-07-08: 2 mg via INTRAVENOUS

## 2023-07-08 MED ORDER — LIDOCAINE HCL 2 % IJ SOLN
INTRAMUSCULAR | Status: AC
  Filled 2023-07-08: qty 20

## 2023-07-08 MED ORDER — IOHEXOL 180 MG/ML  SOLN
10.0000 mL | Freq: Once | INTRAMUSCULAR | Status: AC
  Administered 2023-07-08: 10 mL via INTRA_ARTICULAR

## 2023-07-08 MED ORDER — ROPIVACAINE HCL 2 MG/ML IJ SOLN
9.0000 mL | Freq: Once | INTRAMUSCULAR | Status: AC
  Administered 2023-07-08: 9 mL via INTRA_ARTICULAR

## 2023-07-08 MED ORDER — DEXAMETHASONE SODIUM PHOSPHATE 10 MG/ML IJ SOLN
10.0000 mg | Freq: Once | INTRAMUSCULAR | Status: AC
  Administered 2023-07-08: 10 mg

## 2023-07-08 MED ORDER — LIDOCAINE HCL 2 % IJ SOLN
20.0000 mL | Freq: Once | INTRAMUSCULAR | Status: AC
  Administered 2023-07-08: 400 mg

## 2023-07-08 MED ORDER — METHYLPREDNISOLONE ACETATE 80 MG/ML IJ SUSP
80.0000 mg | Freq: Once | INTRAMUSCULAR | Status: AC
  Administered 2023-07-08: 80 mg via INTRA_ARTICULAR

## 2023-07-08 MED ORDER — ROPIVACAINE HCL 2 MG/ML IJ SOLN
INTRAMUSCULAR | Status: AC
  Filled 2023-07-08: qty 20

## 2023-07-08 MED ORDER — MIDAZOLAM HCL 2 MG/2ML IJ SOLN
INTRAMUSCULAR | Status: AC
  Filled 2023-07-08: qty 2

## 2023-07-08 MED ORDER — METHYLPREDNISOLONE ACETATE 80 MG/ML IJ SUSP
INTRAMUSCULAR | Status: AC
  Filled 2023-07-08: qty 1

## 2023-07-08 MED ORDER — DEXAMETHASONE SODIUM PHOSPHATE 10 MG/ML IJ SOLN
INTRAMUSCULAR | Status: AC
  Filled 2023-07-08: qty 1

## 2023-07-08 MED ORDER — ROPIVACAINE HCL 2 MG/ML IJ SOLN
9.0000 mL | Freq: Once | INTRAMUSCULAR | Status: AC
  Administered 2023-07-08: 9 mL

## 2023-07-08 MED ORDER — LACTATED RINGERS IV SOLN: Freq: Once | INTRAVENOUS | Status: AC

## 2023-07-08 NOTE — Progress Notes (Signed)
Safety precautions to be maintained throughout the outpatient stay will include: orient to surroundings, keep bed in low position, maintain call bell within reach at all times, provide assistance with transfer out of bed and ambulation.  

## 2023-07-08 NOTE — Patient Instructions (Signed)

## 2023-07-08 NOTE — Progress Notes (Signed)
PROVIDER NOTE: Interpretation of information contained herein should be left to medically-trained personnel. Specific patient instructions are provided elsewhere under "Patient Instructions" section of medical record. This document was created in part using STT-dictation technology, any transcriptional errors that may result from this process are unintentional.  Patient: Kristine [name] Type: Established DOB: Apr 04, 1971 MRN: [id number] PCP: Enid Baas, MD  Service: Procedure DOS: 07/08/2023 Setting: Ambulatory Location: Ambulatory outpatient facility Delivery: Face-to-face Provider: Edward Jolly, MD Specialty: Interventional Pain Management Specialty designation: 09 Location: Outpatient facility Ref. Prov.: Edward Jolly, MD       Interventional Therapy   Procedure: Sacroiliac Joint Steroid Injection #1   and Bilateral Piriformis TPI Laterality: Bilateral     Level: PIIS (Posterior Inferior  Iliac Spine)  Imaging: Fluoroscopic guidance Anesthesia: Local anesthesia (1-2% Lidocaine) Anxiolysis: IV Versed 2 mg DOS: 07/08/2023  Performed by: Edward Jolly, MD  Purpose: Diagnostic/Therapeutic Indications: Sacroiliac joint pain in the lower back and hip area severe enough to impact quality of life or function. Rationale (medical necessity): procedure needed and proper for the diagnosis and/or treatment of Kristine [name]'s medical symptoms and needs. 1. Sacroiliac joint pain   2. SI joint arthritis   3. Piriformis syndrome of both sides    NAS-11 Pain score:   Pre-procedure: 6 /10   Post-procedure: 6  (states she fills no relieft at this time)/10     Target: Interarticular sacroiliac joint. Location: Medial to the postero-medial edge of iliac spine. Region: Lumbosacral-sacrococcygeal. Approach: Inferior postero-medial percutaneous approach. Type of procedure: Percutaneous joint injection.  Position / Prep / Materials:  Position: Prone  Prep solution: DuraPrep (Iodine Povacrylex  [0.7% available iodine] and Isopropyl Alcohol, 74% w/w) Prep Area: Entire posterior lumbosacral area  Materials:  Tray: Block Needle(s):  Type: Spinal  Gauge (G): 25  Length: 3.5-in Qty: 2  H&P (Pre-op Assessment):  Kristine [name] is a 52 y.o. (year old), female patient, seen today for interventional treatment. She  has a past surgical history that includes Nasal sinus surgery; Colonoscopy (2006); Cystoscopy with hydrodistension and biopsy; Cesarean section; Laparoscopic assisted vaginal hysterectomy (N/A, 08/20/2016); Laparoscopic bilateral salpingectomy (Bilateral, 08/20/2016); Coronary angioplasty (2013); Cystoscopy (N/A, 10/23/2016); Botox injection (N/A, 10/23/2016); Colonoscopy with propofol (N/A, 07/04/2020); Breast biopsy (Left, 11/13/2013); Cosmetic surgery (06/09/2021); Tubal ligation (2003); Blepharoplasty (2019); Combined reduction mammaplasty w/ abdominoplasty (Bilateral, 05/2021); Esophagogastroduodenoscopy; Tonsillectomy; Anterior cervical decomp/discectomy fusion (N/A, 02/09/2022); Reduction mammaplasty (Bilateral, 06/11/2021); Cervical fusion (2023); and Knee surgery (Right, 03/20/2023). Kristine [name] has a current medication list which includes the following prescription(s): albuterol, alprazolam, azelastine, cetirizine, cranberry, desvenlafaxine, divalproex, epinephrine, guselkumab, hydrochlorothiazide, levocetirizine, levothyroxine, lurasidone, montelukast, multiple vitamin, nitrofurantoin (macrocrystal-monohydrate), omalizumab, ondansetron, ubrelvy, cyanocobalamin, ascorbic acid, polyethylene glycol, and ozempic (2 mg/dose), and the following Facility-Administered Medications: lactated ringers. Her primarily concern today is the Back Pain  Initial Vital Signs:  Pulse/HCG Rate: 73ECG Heart Rate: 74 Temp: (!) 97.2 F (36.2 C) Resp: 18 BP: 131/71 SpO2: 98 %  BMI: Estimated body mass index is 33.29 kg/m as calculated from the following:   Height as of this encounter: 5\' 2"  (1.575  m).   Weight as of this encounter: 182 lb (82.6 kg).  Risk Assessment: Allergies: Reviewed. She is allergic to bee pollen, bee venom, egg-derived products, keflex [cephalexin], milk (cow), milk-related compounds, penicillins, red dye #40 (allura red), shellfish allergy, soy allergy, tilactase, topamax [topiramate], wheat, yellow dyes (non-tartrazine), ferrous gluconate, gabapentin, hydrocodone, iron, butalbital-apap-caffeine, codeine, iodine, latex, meclizine, and robaxin [methocarbamol].  Allergy Precautions: None required Coagulopathies: Reviewed. None identified.  Blood-thinner therapy: None  at this time Active Infection(s): Reviewed. None identified. Kristine [name] is afebrile  Site Confirmation: Kristine [name] was asked to confirm the procedure and laterality before marking the site Procedure checklist: Completed Consent: Before the procedure and under the influence of no sedative(s), amnesic(s), or anxiolytics, the patient was informed of the treatment options, risks and possible complications. To fulfill our ethical and legal obligations, as recommended by the American Medical Association's Code of Ethics, I have informed the patient of my clinical impression; the nature and purpose of the treatment or procedure; the risks, benefits, and possible complications of the intervention; the alternatives, including doing nothing; the risk(s) and benefit(s) of the alternative treatment(s) or procedure(s); and the risk(s) and benefit(s) of doing nothing. The patient was provided information about the general risks and possible complications associated with the procedure. These may include, but are not limited to: failure to achieve desired goals, infection, bleeding, organ or nerve damage, allergic reactions, paralysis, and death. In addition, the patient was informed of those risks and complications associated to the procedure, such as failure to decrease pain; infection; bleeding; organ or nerve damage with  subsequent damage to sensory, motor, and/or autonomic systems, resulting in permanent pain, numbness, and/or weakness of one or several areas of the body; allergic reactions; (i.e.: anaphylactic reaction); and/or death. Furthermore, the patient was informed of those risks and complications associated with the medications. These include, but are not limited to: allergic reactions (i.e.: anaphylactic or anaphylactoid reaction(s)); adrenal axis suppression; blood sugar elevation that in diabetics may result in ketoacidosis or comma; water retention that in patients with history of congestive heart failure may result in shortness of breath, pulmonary edema, and decompensation with resultant heart failure; weight gain; swelling or edema; medication-induced neural toxicity; particulate matter embolism and blood vessel occlusion with resultant organ, and/or nervous system infarction; and/or aseptic necrosis of one or more joints. Finally, the patient was informed that Medicine is not an exact science; therefore, there is also the possibility of unforeseen or unpredictable risks and/or possible complications that may result in a catastrophic outcome. The patient indicated having understood very clearly. We have given the patient no guarantees and we have made no promises. Enough time was given to the patient to ask questions, all of which were answered to the patient's satisfaction. Ms. Council has indicated that she wanted to continue with the procedure. Attestation: I, the ordering provider, attest that I have discussed with the patient the benefits, risks, side-effects, alternatives, likelihood of achieving goals, and potential problems during recovery for the procedure that I have provided informed consent. Date  Time: 07/08/2023  1:00 PM  Pre-Procedure Preparation:  Monitoring: As per clinic protocol. Respiration, ETCO2, SpO2, BP, heart rate and rhythm monitor placed and checked for adequate function Safety  Precautions: Patient was assessed for positional comfort and pressure points before starting the procedure. Time-out: I initiated and conducted the "Time-out" before starting the procedure, as per protocol. The patient was asked to participate by confirming the accuracy of the "Time Out" information. Verification of the correct person, site, and procedure were performed and confirmed by me, the nursing staff, and the patient. "Time-out" conducted as per Joint Commission's Universal Protocol (UP.01.01.01). Time: 1333 Start Time: 1333 hrs.  Description/Narrative of Procedure:          Start Time: 1333 hrs.  Rationale (medical necessity): procedure needed and proper for the diagnosis and/or treatment of the patient's medical symptoms and needs. Procedural Technique Safety Precautions: Aspiration looking for blood return was  conducted prior to all injections. At no point did we inject any substances, as a needle was being advanced. No attempts were made at seeking any paresthesias. Safe injection practices and needle disposal techniques used. Medications properly checked for expiration dates. SDV (single dose vial) medications used. Description of the Procedure: Protocol guidelines were followed. The patient was assisted into a comfortable position. The target area was identified and the area prepped in the usual manner. Skin & deeper tissues infiltrated with local anesthetic. Appropriate amount of time allowed to pass for local anesthetics to take effect. The procedure needles were then advanced to the target area. Proper needle placement secured. Negative aspiration confirmed. Solution injected in intermittent fashion, asking for systemic symptoms every 0.5cc of injectate. The needles were then removed and the area cleansed, making sure to leave some of the prepping solution back to take advantage of its long term bactericidal properties.  Technical description of procedure:  Fluoroscopy using a posterior  anterior 45 degree angle from the midline aiming at the anterolateral aspect of the patient was used to find a direct path into the sacroiliac joint, the superior medial to posterior superior iliac spine.  The skin was marked where the desired target and the skin infiltrated with local anesthetics.  The procedure needle was then advanced until the joint was entered.  Once inside of the joint, we then proceeded to inject the desired solution.  10 cc solution made of 9 cc of 0.2% ropivacaine, 1 cc of methylprednisolone, 80 mg/cc.  5 cc injected into the left SI joint, 5 cc injected into the right SI joint.   Afterwards a right & left piriformis trigger point injection was done 1 cm inferior, 1 cm deep, 1 cm lateral to the inferior fissure of the SI joint.  Contrast was injected to confirm piriformis muscle striation.  While injecting, patient did not complain of any pain radiating down his leg.  10 cc solution consisting of 9 cc of 0.2% lidocaine, 1 cc of Decadron 10 mg/cc.  5 cc injected into the left piriformis, 5 cc injected into the right piriformis.     Vitals:   07/08/23 1330 07/08/23 1335 07/08/23 1340 07/08/23 1345  BP: (!) 126/99 (!) 126/99 128/72 114/65  Pulse:      Resp: 18 16 18 16   Temp:    (!) 97.4 F (36.3 C)  TempSrc:      SpO2: 97% 100% 99% 99%  Weight:      Height:         End Time: 1340 hrs.  Imaging Guidance (Non-Spinal):          Type of Imaging Technique: Fluoroscopy Guidance (Non-Spinal) Indication(s): Assistance in needle guidance and placement for procedures requiring needle placement in or near specific anatomical locations not easily accessible without such assistance. Exposure Time: Please see nurses notes. Contrast: Before injecting any contrast, we confirmed that the patient did not have an allergy to iodine, shellfish, or radiological contrast. Once satisfactory needle placement was completed at the desired level, radiological contrast was injected. Contrast  injected under live fluoroscopy. No contrast complications. See chart for type and volume of contrast used. Fluoroscopic Guidance: I was personally present during the use of fluoroscopy. "Tunnel Vision Technique" used to obtain the best possible view of the target area. Parallax error corrected before commencing the procedure. "Direction-depth-direction" technique used to introduce the needle under continuous pulsed fluoroscopy. Once target was reached, antero-posterior, oblique, and lateral fluoroscopic projection used confirm needle placement in all  planes. Images permanently stored in EMR. Interpretation: I personally interpreted the imaging intraoperatively. Adequate needle placement confirmed in multiple planes. Appropriate spread of contrast into desired area was observed. No evidence of afferent or efferent intravascular uptake. Permanent images saved into the patient's record.  Post-operative Assessment:  Post-procedure Vital Signs:  Pulse/HCG Rate: 7370 Temp: (!) 97.4 F (36.3 C) Resp: 16 BP: 114/65 SpO2: 99 %  EBL: None  Complications: No immediate post-treatment complications observed by team, or reported by patient.  Note: The patient tolerated the entire procedure well. A repeat set of vitals were taken after the procedure and the patient was kept under observation following institutional policy, for this type of procedure. Post-procedural neurological assessment was performed, showing return to baseline, prior to discharge. The patient was provided with post-procedure discharge instructions, including a section on how to identify potential problems. Should any problems arise concerning this procedure, the patient was given instructions to immediately contact us, at any time, without hesitation. In any case, we plan to contact the patient by telephone for a follow-up status report regarding this interventional procedure.  Comments:  No additional relevant information.  Plan of Care  (POC)  Orders:  Orders Placed This Encounter  Procedures   DG PAIN CLINIC C-ARM 1-60 MIN NO REPORT    Intraoperative interpretation by procedural physician at [facility].    Standing Status:   Standing    Number of Occurrences:   1    Order Specific Question:   Reason for exam:    Answer:   Assistance in needle guidance and placement for procedures requiring needle placement in or near specific anatomical locations not easily accessible without such assistance.    Medications ordered for procedure: Meds ordered this encounter  Medications   iohexol (OMNIPAQUE) 180 MG/ML injection 10 mL    Must be Myelogram-compatible. If not available, you may substitute with a water-soluble, non-ionic, hypoallergenic, myelogram-compatible radiological contrast medium.   lidocaine (XYLOCAINE) 2 % (with pres) injection 400 mg   lactated ringers infusion   midazolam (VERSED) injection 0.5-2 mg    Make sure Flumazenil is available in the pyxis when using this medication. If oversedation occurs, administer 0.2 mg IV over 15 sec. If after 45 sec no response, administer 0.2 mg again over 1 min; may repeat at 1 min intervals; not to exceed 4 doses (1 mg)   methylPREDNISolone acetate (DEPO-MEDROL) injection 80 mg   dexamethasone (DECADRON) injection 10 mg   ropivacaine (PF) 2 mg/mL (0.2%) (NAROPIN) injection 9 mL   ropivacaine (PF) 2 mg/mL (0.2%) (NAROPIN) injection 9 mL   Medications administered: We administered iohexol, lidocaine, lactated ringers, midazolam, methylPREDNISolone acetate, dexamethasone, ropivacaine (PF) 2 mg/mL (0.2%), and ropivacaine (PF) 2 mg/mL (0.2%).  See the medical record for exact dosing, route, and time of administration.  Follow-up plan:   Return in about 5 weeks (around 08/12/2023), or VV PPE.       C7/T1 ESI 10/16/21 (3 cc), 12/18/21 (4 cc); B/L L2,3,4,5, MBNB, Right L3/4 ESI 05/20/23. B/L SI-J and Piriformis 07/08/23        Recent Visits Date Type Provider Dept   06/26/23 Office Visit Edward Jolly, MD [facility]  05/20/23 Procedure visit Edward Jolly, MD [facility]  04/25/23 Office Visit Edward Jolly, MD [facility]  Showing recent visits within past 90 days and meeting all other requirements Today's Visits Date Type Provider Dept  07/08/23 Procedure visit Edward Jolly, MD [facility]  Showing today's visits and  meeting all other requirements Future Appointments Date Type Provider Dept  08/12/23 Appointment Edward Jolly, MD [facility]  Showing future appointments within next 90 days and meeting all other requirements  Disposition: Discharge home  Discharge (Date  Time): 07/08/2023; 1351 hrs.   Primary Care Physician: Enid Baas, MD Location: [facility] Note by: Edward Jolly, MD (TTS technology used. I apologize for any typographical errors that were not detected and corrected.) Date: 07/08/2023; Time: 1:54 PM  Disclaimer:  Medicine is not an Visual merchandiser. The only guarantee in medicine is that nothing is guaranteed. It is important to note that the decision to proceed with this intervention was based on the information collected from the patient. The Data and conclusions were drawn from the patient's questionnaire, the interview, and the physical examination. Because the information was provided in large part by the patient, it cannot be guaranteed that it has not been purposely or unconsciously manipulated. Every effort has been made to obtain as much relevant data as possible for this evaluation. It is important to note that the conclusions that lead to this procedure are derived in large part from the available data. Always take into account that the treatment will also be dependent on availability of resources and existing treatment guidelines, considered by other Pain Management Practitioners as being common knowledge and practice, at the time of the  intervention. For Medico-Legal purposes, it is also important to point out that variation in procedural techniques and pharmacological choices are the acceptable norm. The indications, contraindications, technique, and results of the above procedure should only be interpreted and judged by a Board-Certified Interventional Pain Specialist with extensive familiarity and expertise in the same exact procedure and technique.

## 2023-07-09 ENCOUNTER — Telehealth: Payer: Self-pay | Admitting: *Deleted

## 2023-07-09 ENCOUNTER — Telehealth: Payer: Self-pay | Admitting: Student in an Organized Health Care Education/Training Program

## 2023-07-09 NOTE — Telephone Encounter (Signed)
Attempted to call patient back.  Left message for her to call us at the office.

## 2023-07-09 NOTE — Telephone Encounter (Signed)
Patient called back and stated that she did not have any relief from the injection. She did state that she went to sleep until 1630 yesterday. Instructed her to give the steroid time to work and to call back as needed. Also told to use heat today.

## 2023-07-09 NOTE — Telephone Encounter (Signed)
Attempted to call for post procedure follow-up. Message left. 

## 2023-07-09 NOTE — Telephone Encounter (Signed)
PT called back states that she was in pain all night. PT states that he pain has been between 7 and 8. PT states that she only got about 3 hours of good rest. PT states that she did use an heat pad last night because ice wasn't working. Please give patient a call. TY

## 2023-07-11 ENCOUNTER — Telehealth: Payer: Self-pay

## 2023-07-11 ENCOUNTER — Telehealth: Payer: Self-pay | Admitting: Student in an Organized Health Care Education/Training Program

## 2023-07-11 NOTE — Telephone Encounter (Signed)
Left message for patient to call office.  

## 2023-07-11 NOTE — Telephone Encounter (Signed)
Patient notified and states that she will follow up with her Neurosurgeon.

## 2023-07-11 NOTE — Telephone Encounter (Signed)
PT states that since the procedure she has been in so much pain. PT states that she can't walk. PT states that her pain is a 12. PT states that her hands and feet hurts as well, but pain in her back is the worst.Pain in her back feels like cramps and feels like she can feel her heart beat in her back. PT states that the pain is worst than before she came in to have procedure done. PT states that she will have to be push in a wheelchair tonight she is going out. Please give patient a call. TY

## 2023-07-23 ENCOUNTER — Telehealth: Payer: Self-pay | Admitting: Orthopedic Surgery

## 2023-07-23 NOTE — Telephone Encounter (Signed)
Patient states she is having true incontinence. She states she does not have weakness in the legs and can feel when she wipes. She says the bathroom is just right beside the bed and when she wakes up and goes to get up it just runs out. She has to wear pads to catch it and will sometimes have to change clothes. She does not have incontinence as much during the day unless she drinks a lot. Pain management said there is nothing left that they can do.

## 2023-07-23 NOTE — Telephone Encounter (Signed)
If she is having true incontinence then I recommend that she go to the ED for evaluation.

## 2023-07-23 NOTE — Telephone Encounter (Addendum)
Patient notified and she voiced understanding. She states she cannot go to the ED. Her insurance will not cover it unless it's a true emergency. I informed her this is our recommendation. She has an already scheduled appointment with Kennyth Arnold in a few days, and she states she will just come that appointment and discuss it further.

## 2023-07-23 NOTE — Telephone Encounter (Signed)
I have not seen her since March. At that time she had chronic frequency of her bowels and bladder (had urge to go but could not always get to bathroom in time).   Is this still the case or is she having true incontinence? Can she feel when she wipes? Any numbness or weakness in her legs?   Please let me know.

## 2023-07-23 NOTE — Telephone Encounter (Signed)
Patient has an appt with Stacy on 07/29/2023. She is still having excruciating back pain. She feels that the urinary incontinence is getting worse. She has been having more accidents. As soon as she gets up from bed in the  morning she will have an accident. She is asking for a telephone visit before 07/29/2023.

## 2023-07-26 NOTE — Progress Notes (Unsigned)
Referring Physician:  No referring provider defined for this encounter.  Primary Physician:  Enid Baas, MD  History of Present Illness:  DOS: 02/09/22 C5-6 ACDF for cervical myelopathy  She has a history of psoriatic arthritis, migraines, cervical CA, and chronic pain syndrome.   She has known facet hypertrophy and lumbar spondylosis. This is likely her pain generator. She has been following with Dr. Cherylann Ratel.   She called on 9/`0/24 stating she was having urinary incontinence. No leg weakness or perineal numbness. She was advised to go to the ED and she declined. No relief with injections with pain management.   She is here for follow up.          Last seen by Dr. Myer Haff on 11/13/22 and she was doing well. He advised her to follow up with Dr. Sherryll Burger for her migraines.   She has known neuropathy in her legs and both feet are numb. Previous lumbar MRI showed some facet hypertrophy and lumbar spondylosis, however I thought most of her pain was from her SI joint. We discussed PT and she declined.   She is here for follow up.   3 months of more constant LBP with intermittent posterior leg pain to her feet bilaterally. She has been limping a lot due to right knee pain. She has been told she needs a right knee replacement. As above, she has known neuropathy in both legs.   She notes chronic frequency of her bowels/bladder and sometimes does not make it to the bathroom in time. No perineal numbness.   Her rheumatologist has called in prednisone for her.    Conservative measures:  Physical therapy: has not helped in the past.  Multimodal medical therapy including regular antiinflammatories: none  Injections:  Bilateral SI joint injections and bilateral piriformis TPI  07/08/23 L3-L4 IL ESI 05/20/23 Bilateral MBB L2, L3, L4, L5  on 02/25/23  Past Surgery: ACDF C5-C6 02/09/22.    Review of Systems:  A 10 point review of systems is negative, except for the pertinent  positives and negatives detailed in the HPI.  Past Medical History: Past Medical History:  Diagnosis Date   Abnormal weight gain 08/05/2013   Absence of interventricular septum 08/05/2013   Allergic rhinitis 04/17/2016   Allergic urticaria due to ingested food 02/09/2016   Anxiety    Asthma    Cancer (HCC)    melanoma-left eye and right hand   Clinical depression 09/30/2013   Complication of anesthesia    PT HAD TROUBLE BREATHING AFTER HYSTERECTOMY IN OCTOBER   Coronary artery disease 06/25/2023   Depression    Dizziness    Excess weight 09/30/2013   GERD (gastroesophageal reflux disease)    Headache, migraine 09/30/2013   Heart murmur    Heavy drinker 04/11/2014   Overview:  Patient reports drinking 2-3 bottles of wine each weekend.     History of methicillin resistant staphylococcus aureus (MRSA) 11/2015   Hypothyroidism    Infection of urinary tract 09/30/2013   Irregular bleeding 09/30/2013   Multiple allergies    Pneumonia    Pre-diabetes    VSD (ventricular septal defect and aortic arch hypoplasia     Past Surgical History: Past Surgical History:  Procedure Laterality Date   ANTERIOR CERVICAL DECOMP/DISCECTOMY FUSION N/A 02/09/2022   Procedure: C5-6 ANTERIOR CERVICAL DISCECTOMY AND FUSION (GLOBUS HEDRON);  Surgeon: Venetia Night, MD;  Location: [facility];  Service: Neurosurgery;  Laterality: N/A;   BLEPHAROPLASTY  2019   BOTOX INJECTION N/A 10/23/2016  Procedure: BOTOX INJECTION;  Surgeon: Orson Ape, MD;  Location: [facility];  Service: Urology;  Laterality: N/A;   BREAST BIOPSY Left 11/13/2013   benign   CERVICAL FUSION  2023   CESAREAN SECTION     x2   COLONOSCOPY  2006   COLONOSCOPY WITH PROPOFOL N/A 07/04/2020   Procedure: COLONOSCOPY WITH PROPOFOL;  Surgeon: Regis Bill, MD;  Location: [facility];  Service: Endoscopy;  Laterality: N/A;   COMBINED REDUCTION MAMMAPLASTY W/ ABDOMINOPLASTY Bilateral 05/2021   CORONARY ANGIOPLASTY  2013    COSMETIC SURGERY  06/09/2021   tummy tuck   CYSTOSCOPY N/A 10/23/2016   Procedure: CYSTOSCOPY;  Surgeon: Orson Ape, MD;  Location: [facility];  Service: Urology;  Laterality: N/A;   CYSTOSCOPY WITH HYDRODISTENSION AND BIOPSY     ESOPHAGOGASTRODUODENOSCOPY     KNEE SURGERY Right 03/20/2023   [facility], [place], [place]   LAPAROSCOPIC ASSISTED VAGINAL HYSTERECTOMY N/A 08/20/2016   Procedure: LAPAROSCOPIC ASSISTED VAGINAL HYSTERECTOMY;  Surgeon: Suzy Bouchard, MD;  Location: [facility];  Service: Gynecology;  Laterality: N/A;   LAPAROSCOPIC BILATERAL SALPINGECTOMY Bilateral 08/20/2016   Procedure: LAPAROSCOPIC BILATERAL SALPINGECTOMY;  Surgeon: Suzy Bouchard, MD;  Location: [facility];  Service: Gynecology;  Laterality: Bilateral;   NASAL SINUS SURGERY     x12   REDUCTION MAMMAPLASTY Bilateral 06/11/2021   TONSILLECTOMY     TUBAL LIGATION  2003    Allergies: Allergies as of 07/29/2023 - Review Complete 07/08/2023  Allergen Reaction Noted   Bee pollen Anaphylaxis 04/17/2016   Bee venom Anaphylaxis 04/17/2016   Egg-derived products Anaphylaxis 08/06/2015   Keflex [cephalexin] Anaphylaxis 08/06/2015   Milk (cow) Anaphylaxis    Milk-related compounds Anaphylaxis 08/06/2015   Penicillins Anaphylaxis 08/06/2015   Red dye #40 (allura red) Shortness Of Breath 06/17/2018   Shellfish allergy Anaphylaxis and Other (See Comments) 04/17/2016   Soy allergy Anaphylaxis 08/06/2015   Tilactase Anaphylaxis and Other (See Comments) 04/17/2016   Topamax [topiramate]  08/29/2020   Wheat Anaphylaxis 04/30/2016   Yellow dyes (non-tartrazine) Shortness Of Breath 01/30/2022   Ferrous gluconate Diarrhea and Nausea And Vomiting 08/05/2013   Gabapentin Other (See Comments) 11/03/2019   Hydrocodone Hives 04/17/2016   Iron Diarrhea 04/17/2016   Butalbital-apap-caffeine Other (See Comments) 02/04/2019   Codeine Rash 04/17/2016   Iodine Rash 08/20/2016   Latex Itching and Rash  04/17/2016   Meclizine Other (See Comments) 08/29/2020   Robaxin [methocarbamol] Other (See Comments) 02/09/2022    Medications: Outpatient Encounter Medications as of 07/29/2023  Medication Sig   albuterol (PROVENTIL HFA;VENTOLIN HFA) 108 (90 Base) MCG/ACT inhaler Inhale 2 puffs into the lungs every 6 (six) hours as needed for wheezing or shortness of breath.   ALPRAZolam (XANAX) 0.5 MG tablet Take 1 tablet (0.5 mg total) by mouth as directed. Take it only once a day as needed for severe panic symptoms   azelastine (OPTIVAR) 0.05 % ophthalmic solution Place 1 drop into both eyes daily as needed (matted eyes in the morning).   cetirizine (ZYRTEC) 10 MG tablet Take 20 mg by mouth 2 (two) times daily.   CRANBERRY PO Take 1 capsule by mouth at bedtime.   desvenlafaxine (PRISTIQ) 100 MG 24 hr tablet Take 1 tablet (100 mg total) by mouth daily.   divalproex (DEPAKOTE) 250 MG DR tablet Take 250 mg by mouth 2 (two) times daily.   EPINEPHrine 0.3 mg/0.3 mL IJ SOAJ injection Inject 0.3 mLs (0.3 mg total) into the muscle once.   Guselkumab (TREMFYA [place])  Inject into the skin.   hydrochlorothiazide (HYDRODIURIL) 25 MG tablet Take 25 mg by mouth daily.   levocetirizine (XYZAL) 5 MG tablet Take 10 mg by mouth 2 (two) times daily.   levothyroxine (SYNTHROID, LEVOTHROID) 50 MCG tablet Take 50-100 mcg by mouth See admin instructions. Take 100 mcg by mouth every other day, alternating with 50 mcg on alternate days.   lurasidone (LATUDA) 40 MG TABS tablet Take 40 mg by mouth daily with breakfast.   montelukast (SINGULAIR) 10 MG tablet Take 10 mg by mouth at bedtime.    MULTIPLE VITAMIN PO Take 1 tablet by mouth daily.   nitrofurantoin, macrocrystal-monohydrate, (MACROBID) 100 MG capsule Take 100 mg by mouth daily as needed (after being in water).   omalizumab (XOLAIR) 150 MG injection Inject 300 mg into the skin every 21 ( twenty-one) days.   ondansetron (ZOFRAN-ODT) 4 MG disintegrating tablet Take 1 tablet (4  mg total) by mouth every 8 (eight) hours as needed for nausea or vomiting.   polyethylene glycol (MIRALAX) 17 g packet Take 17 g by mouth daily. (Patient not taking: Reported on 07/08/2023)   Semaglutide, 2 MG/DOSE, (OZEMPIC, 2 MG/DOSE,) 8 MG/3ML SOPN Inject 2 mg subcutaneously once a week (Patient not taking: Reported on 02/05/2023)   Ubrogepant (UBRELVY) 100 MG TABS Take 100 mg by mouth 2 (two) times daily.   vitamin B-12 (CYANOCOBALAMIN) 1000 MCG tablet Take 1,000 mcg by mouth daily.   vitamin C (ASCORBIC ACID) 500 MG tablet Take 500 mg by mouth at bedtime.   No facility-administered encounter medications on file as of 07/29/2023.    Social History: Social History   Tobacco Use   Smoking status: Former    Current packs/day: 0.00    Types: Cigarettes    Quit date: 11/12/2006    Years since quitting: 16.7   Smokeless tobacco: Never   Tobacco comments:    quit 2008  Vaping Use   Vaping status: Never Used  Substance Use Topics   Alcohol use: No    Alcohol/week: 0.0 standard drinks of alcohol   Drug use: No    Family Medical History: Family History  Problem Relation Age of Onset   Breast cancer Maternal Aunt    Breast cancer Paternal Aunt    Breast cancer Maternal Grandmother    Breast cancer Paternal Grandmother    Breast cancer Paternal Aunt    Bipolar disorder Paternal Aunt    Kidney cancer Maternal Uncle    Prostate cancer Paternal Grandfather    Kidney disease Other        father's side   Aneurysm Mother    Liver disease Mother    Drug abuse Mother    Alcohol abuse Mother    Anxiety disorder Mother    Depression Mother    Cancer Father    Anxiety disorder Father    Depression Father     Physical Examination: There were no vitals filed for this visit.  General: Patient is well developed, well nourished, calm, collected, and in no apparent distress. Attention to examination is appropriate.  Respiratory: Patient is breathing without any  difficulty.   NEUROLOGICAL:     Awake, alert, oriented to person, place, and time.  Speech is clear and fluent. Fund of knowledge is appropriate.   Cranial Nerves: Pupils equal round and reactive to light.  Facial tone is symmetric.    Limited ROM of lumbar spine with pain on flexion/extension.   Central lower lumbar tenderness.   No abnormal lesions on  exposed skin.   Strength: Side Biceps Triceps Deltoid Interossei Grip Wrist Ext. Wrist Flex.  R 5 5 4+ 5 4+ 5 5  L 5 5 5 5 5 5 5    Side Iliopsoas Quads Hamstring PF DF EHL  R 5 5 5 5 5 5   L 5 5 5 5 5 5    Reflexes are 2+ and symmetric at the biceps, triceps, brachioradialis, patella and achilles.   Hoffman's is absent.  Clonus is not present.   Bilateral upper and lower extremity sensation is diminished diffusely per patient. Worse in lower extremities.   Slight limp favoring right knee.   Medical Decision Making  Imaging: None   Assessment and Plan: Ms. Brisk is a pleasant 52 y.o. female with 3 months of more constant LBP with intermittent posterior leg pain to her feet bilaterally.   She has known neuropathy in her legs and both feet are numb.   She has known facet hypertrophy and lumbar spondylosis. This is likely her pain generator.   Above treatment options discussed with patient and following plan made:   - Agree with prednisone called in by rheumatology.  - Discussed PT for lumbar spine. She declines.  - Message sent to Dr. Allena Katz with rheumatology at her request.  - Referral to pain management [facility]) to consider lumbar injections.  - Follow up in 6 weeks and prn.   I spent a total of 20 minutes in face-to-face and non-face-to-face activities related to this patient's care today including review of outside records, review of imaging, review of symptoms, physical exam, discussion of differential diagnosis, discussion of treatment options, and documentation.   Drake Leach PA-C Dept. of Neurosurgery

## 2023-07-29 ENCOUNTER — Ambulatory Visit (INDEPENDENT_AMBULATORY_CARE_PROVIDER_SITE_OTHER): Payer: BC Managed Care – PPO | Admitting: Orthopedic Surgery

## 2023-07-29 ENCOUNTER — Encounter: Payer: Self-pay | Admitting: Orthopedic Surgery

## 2023-07-29 VITALS — BP 110/60 | Ht 62.0 in | Wt 182.0 lb

## 2023-07-29 DIAGNOSIS — G629 Polyneuropathy, unspecified: Secondary | ICD-10-CM | POA: Diagnosis not present

## 2023-07-29 DIAGNOSIS — M4726 Other spondylosis with radiculopathy, lumbar region: Secondary | ICD-10-CM

## 2023-07-29 DIAGNOSIS — M5416 Radiculopathy, lumbar region: Secondary | ICD-10-CM

## 2023-07-29 DIAGNOSIS — M47816 Spondylosis without myelopathy or radiculopathy, lumbar region: Secondary | ICD-10-CM

## 2023-07-29 NOTE — Patient Instructions (Signed)
It was so nice to see you today. Thank you so much for coming in.    I want to get an MRI of your lower back to look into things further. We will get this approved through your insurance and [facility] will call you to schedule the appointment.   If you don't hear anything about your MRI, you can call [facility] at [phone].   Call and let us know when the MRI has been done and we will work on getting the images and report.   Once I have the results, we will call you to schedule a follow up phone visit with me to review them.   I don't think the bowel/bladder issues are coming from your back. I would recommend following up with your PCP about this.   Please do not hesitate to call if you have any questions or concerns. You can also message me in MyChart.   Drake Leach PA-C [phone]

## 2023-07-31 ENCOUNTER — Ambulatory Visit: Payer: BC Managed Care – PPO | Admitting: Orthopedic Surgery

## 2023-08-05 ENCOUNTER — Ambulatory Visit: Payer: BC Managed Care – PPO | Admitting: Orthopedic Surgery

## 2023-08-08 ENCOUNTER — Telehealth: Payer: Self-pay | Admitting: Orthopedic Surgery

## 2023-08-08 ENCOUNTER — Telehealth: Payer: Self-pay

## 2023-08-08 NOTE — Telephone Encounter (Signed)
Patient is calling to inform Kennyth Arnold that she just left urgent care this morning. She went due to back pain. Stacy told that patient at her last appt that she believes the back pain and urine incontinence maybe caused by urological issues. The doctor she just saw told the patient that her urine is clear and she has no infection. Her issues are coming from her back. Patient is asking to speak to [name].

## 2023-08-08 NOTE — Telephone Encounter (Signed)
Would you like to schedule a telephone visit?

## 2023-08-08 NOTE — Telephone Encounter (Signed)
Patient called back stating that her MRI is scheduled for 08/15/2023. She states that she will be in town on 08/12/2023 for an appointment with her husband if she needs to come back in. Please advise.

## 2023-08-08 NOTE — Telephone Encounter (Signed)
error 

## 2023-08-08 NOTE — Telephone Encounter (Signed)
Spoke with patient and she is scheduled for a telephone visit. She states she will contact her PCP.

## 2023-08-08 NOTE — Telephone Encounter (Signed)
A urinary tract infection is not the only thing that would cause urinary issues. I recommend that she follow up with PCP for this or I can put in a referral for her to see urology.   Has her MRI been scheduled yet? We will have more information once I get this back, however I am happy to do a phone visit.

## 2023-08-08 NOTE — Telephone Encounter (Signed)
Attempted to call patient for VV appt of 9/30. No answer, unable to leave a message.

## 2023-08-10 ENCOUNTER — Encounter: Payer: Self-pay | Admitting: Neurosurgery

## 2023-08-12 ENCOUNTER — Encounter: Payer: Self-pay | Admitting: Student in an Organized Health Care Education/Training Program

## 2023-08-12 ENCOUNTER — Ambulatory Visit
Payer: BC Managed Care – PPO | Attending: Student in an Organized Health Care Education/Training Program | Admitting: Student in an Organized Health Care Education/Training Program

## 2023-08-12 ENCOUNTER — Telehealth: Payer: Self-pay | Admitting: Orthopedic Surgery

## 2023-08-12 DIAGNOSIS — M461 Sacroiliitis, not elsewhere classified: Secondary | ICD-10-CM

## 2023-08-12 DIAGNOSIS — M546 Pain in thoracic spine: Secondary | ICD-10-CM

## 2023-08-12 DIAGNOSIS — G5703 Lesion of sciatic nerve, bilateral lower limbs: Secondary | ICD-10-CM | POA: Insufficient documentation

## 2023-08-12 DIAGNOSIS — M533 Sacrococcygeal disorders, not elsewhere classified: Secondary | ICD-10-CM | POA: Diagnosis not present

## 2023-08-12 DIAGNOSIS — M47818 Spondylosis without myelopathy or radiculopathy, sacral and sacrococcygeal region: Secondary | ICD-10-CM | POA: Insufficient documentation

## 2023-08-12 NOTE — Progress Notes (Signed)
Patient: Kristine [name]  Service Category: E/M  Provider: Edward Jolly, MD  DOB: 09/18/1971  DOS: 08/12/2023  Location: Office  MRN: [id number]  Setting: Ambulatory outpatient  Referring Provider: Enid Baas, MD  Type: Established Patient  Specialty: Interventional Pain Management  PCP: Enid Baas, MD  Location: Remote location  Delivery: TeleHealth     Virtual Encounter - Pain Management PROVIDER NOTE: Information contained herein reflects review and annotations entered in association with encounter. Interpretation of such information and data should be left to medically-trained personnel. Information provided to patient can be located elsewhere in the medical record under "Patient Instructions". Document created using STT-dictation technology, any transcriptional errors that may result from process are unintentional.    Contact & Pharmacy Preferred: [phone] Home: [phone] (home) Mobile: [phone] (mobile) E-mail: [email or web address]  [id number] - [place], [place] - [address] [place] [address] Phone: [phone] Fax: [phone]   Pre-screening  Kristine [name] offered "in-person" vs "virtual" encounter. She indicated preferring virtual for this encounter.   Reason COVID-19*  Social distancing based on CDC and AMA recommendations.   I contacted [name] on 08/12/2023 via telephone.      I clearly identified myself as Edward Jolly, MD. I verified that I was speaking with the correct person using two identifiers (Name: Kristine [name], and date of birth: 1971/10/03).  Consent I sought verbal advanced consent from Kristine [name] for virtual visit interactions. I informed Kristine [name] of possible security and privacy concerns, risks, and limitations associated with providing "not-in-person" medical evaluation and management services. I also informed [name] of the availability of "in-person" appointments. Finally, I informed her  that there would be a charge for the virtual visit and that she could be  personally, fully or partially, financially responsible for it. Kristine [name] expressed understanding and agreed to proceed.   Historic Elements   Kristine [name] is a 52 y.o. year old, female patient evaluated today after our last contact on 07/11/2023. Kristine [name]  has a past medical history of Abnormal weight gain (08/05/2013), Absence of interventricular septum (08/05/2013), Allergic rhinitis (04/17/2016), Allergic urticaria due to ingested food (02/09/2016), Anxiety, Asthma, Cancer (HCC), Clinical depression (09/30/2013), Complication of anesthesia, Coronary artery disease (06/25/2023), Depression, Dizziness, Excess weight (09/30/2013), GERD (gastroesophageal reflux disease), Headache, migraine (09/30/2013), Heart murmur, Heavy drinker (04/11/2014), History of methicillin resistant staphylococcus aureus (MRSA) (11/2015), Hypothyroidism, Infection of urinary tract (09/30/2013), Irregular bleeding (09/30/2013), Multiple allergies, Pneumonia, Pre-diabetes, and VSD (ventricular septal defect and aortic arch hypoplasia. She also  has a past surgical history that includes Nasal sinus surgery; Colonoscopy (2006); Cystoscopy with hydrodistension and biopsy; Cesarean section; Laparoscopic assisted vaginal hysterectomy (N/A, 08/20/2016); Laparoscopic bilateral salpingectomy (Bilateral, 08/20/2016); Coronary angioplasty (2013); Cystoscopy (N/A, 10/23/2016); Botox injection (N/A, 10/23/2016); Colonoscopy with propofol (N/A, 07/04/2020); Breast biopsy (Left, 11/13/2013); Cosmetic surgery (06/09/2021); Tubal ligation (2003); Blepharoplasty (2019); Combined reduction mammaplasty w/ abdominoplasty (Bilateral, 05/2021); Esophagogastroduodenoscopy; Tonsillectomy; Anterior cervical decomp/discectomy fusion (N/A, 02/09/2022); Reduction mammaplasty (Bilateral, 06/11/2021); Cervical fusion (2023); and Knee surgery (Right, 03/20/2023). Kristine [name] has a  current medication list which includes the following prescription(s): albuterol, alprazolam, azelastine, cetirizine, cranberry, desvenlafaxine, divalproex, epinephrine, guselkumab, hydrochlorothiazide, levocetirizine, levothyroxine, lurasidone, montelukast, multiple vitamin, nitrofurantoin (macrocrystal-monohydrate), omalizumab, ondansetron, polyethylene glycol, ubrelvy, cyanocobalamin, and ascorbic acid. She  reports that she quit smoking about 16 years ago. Her smoking use included cigarettes. She has never used smokeless tobacco. She reports that she does not drink alcohol and does not use drugs. Kristine [name] is allergic  to bee pollen, bee venom, egg-derived products, keflex [cephalexin], milk (cow), milk-related compounds, penicillins, red dye #40 (allura red), shellfish allergy, soy allergy, tilactase, topamax [topiramate], wheat, yellow dyes (non-tartrazine), ferrous gluconate, gabapentin, hydrocodone, iron, butalbital-apap-caffeine, codeine, iodine, latex, meclizine, and robaxin [methocarbamol].  BMI: Estimated body mass index is 33.29 kg/m as calculated from the following:   Height as of 07/29/23: 5\' 2"  (1.575 m).   Weight as of 07/29/23: 182 lb (82.6 kg). Last encounter: 06/26/2023. Last procedure: 07/08/2023.  HPI  Today, she is being contacted for a post-procedure assessment.   Post-procedure evaluation   Procedure: Sacroiliac Joint Steroid Injection #1   and Bilateral Piriformis TPI Laterality: Bilateral     Level: PIIS (Posterior Inferior  Iliac Spine)  Imaging: Fluoroscopic guidance Anesthesia: Local anesthesia (1-2% Lidocaine) Anxiolysis: IV Versed 2 mg DOS: 07/08/2023  Performed by: Edward Jolly, MD  Purpose: Diagnostic/Therapeutic Indications: Sacroiliac joint pain in the lower back and hip area severe enough to impact quality of life or function. Rationale (medical necessity): procedure needed and proper for the diagnosis and/or treatment of [name]'s medical symptoms and  needs. 1. Sacroiliac joint pain   2. SI joint arthritis   3. Piriformis syndrome of both sides    NAS-11 Pain score:   Pre-procedure: 6 /10   Post-procedure: 6  (states she fills no relieft at this time)/10      Effectiveness:  Initial hour after procedure: 100 %  Subsequent 4-6 hours post-procedure: 100 %  Analgesia past initial 6 hours: 0 %  Ongoing improvement:  Analgesic:  0%    Laboratory Chemistry Profile   Renal Lab Results  Component Value Date   BUN 17 09/19/2017   CREATININE 0.85 09/19/2017   BCR 27 (H) 04/17/2016   GFRAA >60 09/19/2017   GFRNONAA >60 09/19/2017    Hepatic Lab Results  Component Value Date   AST 27 09/04/2016   ALT 32 09/04/2016   ALBUMIN 4.1 09/04/2016   ALKPHOS 44 09/04/2016    Electrolytes Lab Results  Component Value Date   NA 137 09/19/2017   K 3.6 02/05/2022   CL 96 (L) 09/19/2017   CALCIUM 9.1 09/19/2017   MG 2.1 04/09/2017    Bone No results found for: "VD25OH", "VD125OH2TOT", "LO7564PP2", "RJ1884ZY6", "25OHVITD1", "25OHVITD2", "25OHVITD3", "TESTOFREE", "TESTOSTERONE"  Inflammation (CRP: Acute Phase) (ESR: Chronic Phase) Lab Results  Component Value Date   ESRSEDRATE 11 09/04/2016         Note: Above Lab results reviewed.    Assessment  The primary encounter diagnosis was Sacroiliac joint pain. Diagnoses of SI joint arthritis and Piriformis syndrome of both sides were also pertinent to this visit.  Plan of Care  Unfortunately no benefit with previous SI joint and piriformis injection.  She states that the steroid medications are causing her to swell and contributing to weight gain and she does not find them effective.  Do not recommend any additional steroid injections as her last couple have resulted in side effects without any analgesic or functional benefit.  At this point, I do not have any additional options for her.  We have discussed strategies for chronic pain management in the past at length including physical  therapy, psychological counseling for pain coping skills, alternative and complementary pain management techniques such as acupuncture and massage.  Patient endorses understanding.    Follow-up plan:   No follow-ups on file.      C7/T1 ESI 10/16/21 (3 cc), 12/18/21 (4 cc); B/L L2,3,4,5, MBNB, Right L3/4 ESI 05/20/23. B/L  SI-J and Piriformis 07/08/23         Recent Visits Date Type Provider Dept  07/08/23 Procedure visit Edward Jolly, MD [facility]  06/26/23 Office Visit Edward Jolly, MD [facility]  05/20/23 Procedure visit Edward Jolly, MD [facility]  Showing recent visits within past 90 days and meeting all other requirements Today's Visits Date Type Provider Dept  08/12/23 Office Visit Edward Jolly, MD [facility]  Showing today's visits and meeting all other requirements Future Appointments No visits were found meeting these conditions. Showing future appointments within next 90 days and meeting all other requirements  I discussed the assessment and treatment plan with the patient. The patient was provided an opportunity to ask questions and all were answered. The patient agreed with the plan and demonstrated an understanding of the instructions.  Patient advised to call back or seek an in-person evaluation if the symptoms or condition worsens.  Duration of encounter: 15 minutes.  Note by: Edward Jolly, MD Date: 08/12/2023; Time: 11:36 AM

## 2023-08-12 NOTE — Telephone Encounter (Signed)
We can try adding a thoracic MRI, but not sure it will be approved.   I would still get lumbar MRI tomorrow and they do thoracic MRI another day if it's not been approved.   Let her know I got her message about bowel issues. As we discussed at last visit, she should follow up with her PCP about this. If she is really concerned, then she should go the ED.

## 2023-08-12 NOTE — Telephone Encounter (Addendum)
I also reviewed patient again with Dr. Katrinka Blazing regarding bowel and bladder issues. These have been going on for 6 months. He recommends getting lumbar MRI as scheduled on Wednesday. If things get worse, she should go to ED. This was discussed with her previously.

## 2023-08-12 NOTE — Telephone Encounter (Signed)
[employer] called and states that the patient called in and asked about adding the thoracic to her authorization. The person from [employer] is calling to see about getting the code for the thoracic.   Ph. [phone]

## 2023-08-12 NOTE — Telephone Encounter (Signed)
Patient is calling to see about having a thoracic MRI done when she goes for her Lumbar MRI on Wednesday. She states that the pain is higher than it was and that she is unable to bend, turn, and that any activity makes her pain worse. Her imaging is scheduled at [facility] in [place]. Please advise.

## 2023-08-12 NOTE — Telephone Encounter (Signed)
I spoke with the patient and notified her of your response. I will work on the authorization and fax the order to [employer].   She stated that she just saw her pain management doctor today, he also gives her injections which are no longer helping her pain and he has ran out of options. The pain management doctor stated that if there is something surgical that we may be able to offer her, that may be the next step. I told her that once we got the MRI results we would have a better answer to what is going on.  She stated that she has already spoke with her PCP about the bowel issues.

## 2023-08-12 NOTE — Telephone Encounter (Signed)
Addressed in another message.

## 2023-08-12 NOTE — Telephone Encounter (Signed)
I will obtain authorization and fax the order over.

## 2023-08-15 ENCOUNTER — Encounter: Payer: Self-pay | Admitting: Orthopedic Surgery

## 2023-08-15 NOTE — Progress Notes (Signed)
MRI of lumbar spine dated 08/14/23:  FINDINGS:   Alignment is normal. Vertebral body heights are maintained. L4 and L5 vertebral body hemangiomas. Otherwise, bone marrow signal is normal.   The conus medullaris terminates at L1-L2. Normal appearance of the cauda equina nerve roots.   Intervertebral disc spaces are maintained. Mild lower lumbar facet arthropathy with mild left neuroforaminal stenosis at L3-L4. Evaluation of individual levels demonstrates no central canal stenosis.   Paraspinal soft tissues are unremarkable.     IMPRESSION:   LOWER LUMBAR FACET ARTHROPATHY WITH MILD LEFT NEUROFORAMINAL STENOSIS AT L3-L4. NO SPINAL CANAL STENOSIS.   Electronically Signed by: Norina Buzzard on 08/14/2023 11:33 AM    We have asked [facility] for images to be sent via [employer], but I don't see anything on the report that would cause her bowel/bladder issues. Will review images once I have them back.

## 2023-08-16 ENCOUNTER — Other Ambulatory Visit: Payer: Self-pay

## 2023-08-16 ENCOUNTER — Inpatient Hospital Stay
Admission: RE | Admit: 2023-08-16 | Discharge: 2023-08-16 | Disposition: A | Discharge status: Home / Self Care | Payer: Self-pay | Source: Ambulatory Visit | Attending: Orthopedic Surgery | Admitting: Orthopedic Surgery

## 2023-08-16 DIAGNOSIS — Z049 Encounter for examination and observation for unspecified reason: Secondary | ICD-10-CM

## 2023-08-20 NOTE — Progress Notes (Deleted)
Telephone Visit- Progress Note: Referring Physician:  Enid Baas, MD [address] [place],  [place] [address]  Primary Physician:  Enid Baas, MD  This visit was performed via telephone.  Patient location: home Provider location: office  I spent a total of *** minutes non-face-to-face activities for this visit on the date of this encounter including review of current clinical condition and response to treatment.    Patient has given verbal consent to this telephone visits and we reviewed the limitations of a telephone visit. Patient wishes to proceed.    Chief Complaint:  ***  History of Present Illness: JENINA MOENING is a 52 y.o. female has a history of history of psoriatic arthritis, migraines, cervical CA, and chronic pain syndrome.   She is s/p ACDF C5-C6 on 02/09/22 for cervical myelopathy.    She has known facet hypertrophy and lumbar spondylosis. She has known neuropathy in her legs and both feet are numb. She is scheduled for left TKA on 09/04/23.   Phone visit to review her thoracic and lumbar MRI scan.           She called on 07/23/23 stating she was having urinary incontinence. No leg weakness or perineal numbness. She was advised to go to the ED and she declined. No relief with injections with pain management.    She is here for follow up.    She has constant LBP with constant bilateral posterior leg pain to her feet. She is scheduled for left TKA on 09/04/23. She has known neuropathy in her legs and both feet are numb. She thinks she has weakness in both legs. The pain in her back is her worst pain.    She donated blood today and had syncope episode. Was advised to follow up with PCP.    She has chronic frequency of her bowels/bladder for over 6 months. She feels urge to void and when she gets up, she notes some leaking. She does not always make it to the bathroom in time. No perineal numbness.    She has chronic weakness in left  arm since her cervical surgery.    Conservative measures:  Physical therapy: has not helped in the past.  Multimodal medical therapy including regular antiinflammatories: none  Injections:  Bilateral SI joint injections and bilateral piriformis TPI  07/08/23 L3-L4 IL ESI 05/20/23 Bilateral MBB L2, L3, L4, L5  on 02/25/23   Past Surgery: ACDF C5-C6 02/09/22.        Exam: No exam done as this was a telephone encounter.     Imaging: MRI lumbar spine dated 08/14/23:  FINDINGS:   Alignment is normal. Vertebral body heights are maintained. L4 and L5 vertebral body hemangiomas. Otherwise, bone marrow signal is normal.   The conus medullaris terminates at L1-L2. Normal appearance of the cauda equina nerve roots.   Intervertebral disc spaces are maintained. Mild lower lumbar facet arthropathy with mild left neuroforaminal stenosis at L3-L4. Evaluation of individual levels demonstrates no central canal stenosis.   Paraspinal soft tissues are unremarkable.     IMPRESSION:   LOWER LUMBAR FACET ARTHROPATHY WITH MILD LEFT NEUROFORAMINAL STENOSIS AT L3-L4. NO SPINAL CANAL STENOSIS.   Electronically Signed by: Norina Buzzard on 08/14/2023 11:33 AM    Thoracic MRI dated 08/21/23:  ***  I have personally reviewed the images and agree with the above interpretation.  Assessment and Plan: Ms. Coglianese is a pleasant 52 y.o. female with constant LBP with constant bilateral posterior leg pain to her  feet. She has known neuropathy in her legs and both feet are numb. She thinks she has weakness in both legs. The pain in her back is her worst pain.    She has known facet hypertrophy and lumbar spondylosis.    She has known neuropathy in her legs and both feet are numb. She is scheduled for left TKA on 09/04/23.    She notes chronic frequency of her bowels/bladder for over 6 months. She feels urge to void and when she gets up, she notes some leaking. She does not always make it to the bathroom in time. No  perineal numbness. No true incontinence.    Above treatment options discussed with patient and following plan made:    - MRI of lumbar spine to further evaluate worsening LBP. She request this be done at [facility]. No relief with injections, time, or medications.  - She will let us know when MRI has been completed so we can work on getting images and report.  - Once I have her MRI results, we will schedule a phone visit to review them.  - Bowel/bladder urgency reviewed with Dr. Katrinka Blazing. He agrees that this is not likely spine mediated. She should f/u with PCP.   Treatment options discussed with patient and following plan made:   - Order for physical therapy for *** spine ***. Patient to call to schedule appointment. *** - Continue current medications including ***. Reviewed dosing and side effects.  - Prescription for ***. Reviewed dosing and side effects. Take with food.  - Prescription for *** to take prn muscle spasms. Reviewed dosing and side effects. Discussed this can cause drowsiness.  - MRI of *** to further evaluate *** radiculopathy. No improvement time or medications (***).  - Referral to PMR at [facility] to discuss possible *** injections.  - Will schedule phone visit to review MRI results once I get them back.   Drake Leach PA-C Neurosurgery

## 2023-08-23 ENCOUNTER — Telehealth: Payer: BC Managed Care – PPO | Admitting: Orthopedic Surgery

## 2023-08-28 NOTE — Progress Notes (Unsigned)
Telephone Visit- Progress Note: Referring Physician:  Enid Baas, MD [address] [place],  [place] [address]  Primary Physician:  Kristine Baas, MD  This visit was performed via telephone.  Patient location: home Provider location: office  I spent a total of 20 minutes non-face-to-face activities for this visit on the date of this encounter including review of current clinical condition and response to treatment.    Patient has given verbal consent to this telephone visits and we reviewed the limitations of a telephone visit. Patient wishes to proceed.    Chief Complaint:  f/u thoracic and lumbar MRI scans  History of Present Illness: Kristine [name] is a 52 y.o. female has a history of history of psoriatic arthritis, migraines, cervical CA, and chronic pain syndrome.   She is s/p ACDF C5-C6 on 02/09/22 for cervical myelopathy.    She has known facet hypertrophy and lumbar spondylosis. She has known neuropathy in her legs and both feet are numb. She is scheduled for left TKA on 09/04/23.   Phone visit to review her thoracic and lumbar MRI scan.   She is scheduled for left TKA tomorrow (09/04/23).   She has constant LBP with constant bilateral posterior leg pain to her feet. She has known neuropathy in her legs and both feet are numb. She thinks she has weakness in both legs.   She does have some intermittent pain in her mid back as well and this is worse with bending, twisting, and turning. Pain is worse at the end of the day. She has trouble standing up straight.    She has chronic frequency of her bowels/bladder for 6+ months. She feels like she is "pushing" all the time due to pain and sometimes she urinates or has a BM when she is not expecting it. She does not always make it to the bathroom in time. No perineal numbness.    She has chronic weakness in left arm since her cervical surgery.   Dr. Cherylann [name] did not recommend any further injections.     Conservative measures:  Physical therapy: has not helped in the past.  Multimodal medical therapy including regular antiinflammatories: none  Injections:  Bilateral SI joint injections and bilateral piriformis TPI  07/08/23 L3-L4 IL ESI 05/20/23 Bilateral MBB L2, L3, L4, L5  on 02/25/23   Past Surgery: ACDF C5-C6 02/09/22.      Exam: No exam done as this was a telephone encounter.     Imaging: MRI lumbar spine dated 08/14/23:  FINDINGS:   Alignment is normal. Vertebral body heights are maintained. L4 and L5 vertebral body hemangiomas. Otherwise, bone marrow signal is normal.   The conus medullaris terminates at L1-L2. Normal appearance of the cauda equina nerve roots.   Intervertebral disc spaces are maintained. Mild lower lumbar facet arthropathy with mild left neuroforaminal stenosis at L3-L4. Evaluation of individual levels demonstrates no central canal stenosis.   Paraspinal soft tissues are unremarkable.     IMPRESSION:   LOWER LUMBAR FACET ARTHROPATHY WITH MILD LEFT NEUROFORAMINAL STENOSIS AT L3-L4. NO SPINAL CANAL STENOSIS.   Electronically Signed by: Kristine [name] on 08/14/2023 11:33 AM    Thoracic MRI dated 08/30/23:  FINDINGS:   Alignment: Normal.  Spinal Cord: Normal in caliber and signal.  Bone marrow signal: No suspicious lesions.   Degenerative changes: Mild multilevel degenerative disc height loss. Mild symmetric disc bulge at T3-T4 without spinal canal or neuroforaminal stenosis.   Regional soft tissues: Unremarkable.     IMPRESSION:  Mild thoracic spondylosis without significant spinal canal or neuroforaminal stenosis.   Electronically Signed by: Kristine [name] on 08/30/2023 9:11 AM    I have personally reviewed the images and agree with the above interpretation.  Assessment and Plan: [name] is a pleasant 52 y.o. female with constant LBP with constant bilateral posterior leg pain to her feet. She has known neuropathy in her legs and both feet are  numb. She thinks she has weakness in both legs.   She has known facet hypertrophy and lumbar spondylosis with mild left foraminal stenosis L3-L4.   She does have some intermittent pain in her mid back as well and this is worse with bending, twisting, and turning. Pain is worse at the end of the day. She has trouble standing up straight.   Thoracic MRI shows mild thoracic spondylosis with no compression.    She has chronic frequency of her bowels/bladder for 6+ months. She feels like she is "pushing" all the time due to pain and sometimes she urinates or has a BM when she is not expecting it. She does not always make it to the bathroom in time. No perineal numbness.   Treatment options discussed with patient and following plan made:   - No improvement with previous PT, injections, or medications. Dr.Lateef did not recommend further injections for her.  - No spinal cause of bowel/bladder issues seen. Referral done to GI and urology. She requests to be seen by [facility] specialists in [facility].  - Referral to pain management for medical management done to [facility] in [facility] as well.  - She scheduled for left TKA tomorrow. Will message her in 4 weeks to check on her.   Kristine Leach PA-C Neurosurgery

## 2023-08-30 ENCOUNTER — Encounter: Payer: Self-pay | Admitting: Dermatology

## 2023-08-30 ENCOUNTER — Encounter: Payer: Self-pay | Admitting: Student in an Organized Health Care Education/Training Program

## 2023-08-30 ENCOUNTER — Encounter: Payer: Self-pay | Admitting: Neurosurgery

## 2023-08-30 NOTE — Telephone Encounter (Signed)
Duplicate message. 

## 2023-09-02 ENCOUNTER — Other Ambulatory Visit: Payer: Self-pay | Admitting: Family Medicine

## 2023-09-02 ENCOUNTER — Inpatient Hospital Stay
Admission: RE | Admit: 2023-09-02 | Discharge: 2023-09-02 | Disposition: A | Discharge status: Home / Self Care | Payer: Self-pay | Source: Ambulatory Visit | Attending: Orthopedic Surgery | Admitting: Orthopedic Surgery

## 2023-09-02 DIAGNOSIS — Z049 Encounter for examination and observation for unspecified reason: Secondary | ICD-10-CM

## 2023-09-03 ENCOUNTER — Ambulatory Visit (INDEPENDENT_AMBULATORY_CARE_PROVIDER_SITE_OTHER): Payer: BC Managed Care – PPO | Admitting: Orthopedic Surgery

## 2023-09-03 ENCOUNTER — Encounter: Payer: Self-pay | Admitting: Orthopedic Surgery

## 2023-09-03 DIAGNOSIS — M4726 Other spondylosis with radiculopathy, lumbar region: Secondary | ICD-10-CM

## 2023-09-03 DIAGNOSIS — M47814 Spondylosis without myelopathy or radiculopathy, thoracic region: Secondary | ICD-10-CM | POA: Diagnosis not present

## 2023-09-03 DIAGNOSIS — M546 Pain in thoracic spine: Secondary | ICD-10-CM

## 2023-09-03 DIAGNOSIS — M48061 Spinal stenosis, lumbar region without neurogenic claudication: Secondary | ICD-10-CM

## 2023-09-03 DIAGNOSIS — M5416 Radiculopathy, lumbar region: Secondary | ICD-10-CM

## 2023-09-03 DIAGNOSIS — M47816 Spondylosis without myelopathy or radiculopathy, lumbar region: Secondary | ICD-10-CM

## 2023-09-23 ENCOUNTER — Ambulatory Visit: Payer: BC Managed Care – PPO | Admitting: Dermatology

## 2023-09-23 DIAGNOSIS — L820 Inflamed seborrheic keratosis: Secondary | ICD-10-CM

## 2023-09-23 DIAGNOSIS — D1801 Hemangioma of skin and subcutaneous tissue: Secondary | ICD-10-CM

## 2023-09-23 DIAGNOSIS — L578 Other skin changes due to chronic exposure to nonionizing radiation: Secondary | ICD-10-CM | POA: Diagnosis not present

## 2023-09-23 DIAGNOSIS — L814 Other melanin hyperpigmentation: Secondary | ICD-10-CM

## 2023-09-23 DIAGNOSIS — L4050 Arthropathic psoriasis, unspecified: Secondary | ICD-10-CM

## 2023-09-23 DIAGNOSIS — B079 Viral wart, unspecified: Secondary | ICD-10-CM | POA: Diagnosis not present

## 2023-09-23 DIAGNOSIS — W908XXA Exposure to other nonionizing radiation, initial encounter: Secondary | ICD-10-CM | POA: Diagnosis not present

## 2023-09-23 DIAGNOSIS — L409 Psoriasis, unspecified: Secondary | ICD-10-CM

## 2023-09-23 DIAGNOSIS — Z85828 Personal history of other malignant neoplasm of skin: Secondary | ICD-10-CM

## 2023-09-23 DIAGNOSIS — Z1283 Encounter for screening for malignant neoplasm of skin: Secondary | ICD-10-CM | POA: Diagnosis not present

## 2023-09-23 DIAGNOSIS — L570 Actinic keratosis: Secondary | ICD-10-CM

## 2023-09-23 DIAGNOSIS — D229 Melanocytic nevi, unspecified: Secondary | ICD-10-CM

## 2023-09-23 DIAGNOSIS — Z7189 Other specified counseling: Secondary | ICD-10-CM

## 2023-09-23 DIAGNOSIS — L821 Other seborrheic keratosis: Secondary | ICD-10-CM

## 2023-09-23 NOTE — Patient Instructions (Signed)

## 2023-09-23 NOTE — Progress Notes (Signed)
Follow-Up Visit   Subjective  Kristine [name] is a 52 y.o. female who presents for the following: Skin Cancer Screening and Full Body Skin Exam  The patient presents for Total-Body Skin Exam (TBSE) for skin cancer screening and mole check. The patient has spots, moles and lesions to be evaluated, some may be new or changing and the patient may have concern these could be cancer.  Psoriasis with psoriatic currently using Taltz which is prescribed by her rheumatologist. Pt has been flaring due to being off for knee replacement.   The following portions of the chart were reviewed this encounter and updated as appropriate: medications, allergies, medical history  Review of Systems:  No other skin or systemic complaints except as noted in HPI or Assessment and Plan.  Objective  Well appearing patient in no apparent distress; mood and affect are within normal limits.  A full examination was performed including scalp, head, eyes, ears, nose, lips, neck, chest, axillae, abdomen, back, buttocks, bilateral upper extremities, bilateral lower extremities, hands, feet, fingers, toes, fingernails, and toenails. All findings within normal limits unless otherwise noted below.   Relevant physical exam findings are noted in the Assessment and Plan.  R chest x 1, R arm x 1, R pretibial x 1, braline x 2, L popliteal x 1 (6) Erythematous stuck-on, waxy papule or plaque  Nose x 1 Erythematous thin papules/macules with gritty scale.   L popliteal x 1 Verrucous papules -- Discussed viral etiology and contagion.     Assessment & Plan   SKIN CANCER SCREENING PERFORMED TODAY.  ACTINIC DAMAGE - Chronic condition, secondary to cumulative UV/sun exposure - diffuse scaly erythematous macules with underlying dyspigmentation - Recommend daily broad spectrum sunscreen SPF 30+ to sun-exposed areas, reapply every 2 hours as needed.  - Staying in the shade or wearing long sleeves, sun glasses (UVA+UVB  protection) and wide brim hats (4-inch brim around the entire circumference of the hat) are also recommended for sun protection.  - Call for new or changing lesions.  LENTIGINES, SEBORRHEIC KERATOSES, HEMANGIOMAS - Benign normal skin lesions - Benign-appearing - Call for any changes  MELANOCYTIC NEVI - Tan-brown and/or pink-flesh-colored symmetric macules and papules - Benign appearing on exam today - Observation - Call clinic for new or changing moles - Recommend daily use of broad spectrum spf 30+ sunscreen to sun-exposed areas.   PSORIASIS with PsA Exam: Well-demarcated erythematous papules/plaques with silvery scale, guttate pink scaly papules on the R elbow and post neck. 2% BSA.  Chronic and persistent condition with duration or expected duration over one year. Condition is bothersome/symptomatic for patient. Currently flared due to being off of Tremfya for knee replacement.   Pt c/o joint pain, currently being seen by rheumatologist.   Psoriasis is a chronic non-curable, but treatable genetic/hereditary disease that may have other systemic features affecting other organ systems such as joints (Psoriatic Arthritis). It is associated with an increased risk of inflammatory bowel disease, heart disease, non-alcoholic fatty liver disease, and depression.  Treatments include light and laser treatments; topical medications; and systemic medications including oral and injectables.  Treatment Plan: Continue Taltz as prescribed by rheumatologist.   Inflamed seborrheic keratosis (6) R chest x 1, R arm x 1, R pretibial x 1, braline x 2, L popliteal x 1  Symptomatic, irritating, patient would like treated.   Destruction of lesion - R chest x 1, R arm x 1, R pretibial x 1, braline x 2, L popliteal x 1 (6) Complexity: simple  Destruction method: cryotherapy   Informed consent: discussed and consent obtained   Timeout:  patient name, date of birth, surgical site, and procedure  verified Lesion destroyed using liquid nitrogen: Yes   Region frozen until ice ball extended beyond lesion: Yes   Outcome: patient tolerated procedure well with no complications   Post-procedure details: wound care instructions given    AK (actinic keratosis) Nose x 1  Actinic keratoses are precancerous spots that appear secondary to cumulative UV radiation exposure/sun exposure over time. They are chronic with expected duration over 1 year. A portion of actinic keratoses will progress to squamous cell carcinoma of the skin. It is not possible to reliably predict which spots will progress to skin cancer and so treatment is recommended to prevent development of skin cancer.  Recommend daily broad spectrum sunscreen SPF 30+ to sun-exposed areas, reapply every 2 hours as needed.  Recommend staying in the shade or wearing long sleeves, sun glasses (UVA+UVB protection) and wide brim hats (4-inch brim around the entire circumference of the hat). Call for new or changing lesions.   Destruction of lesion - Nose x 1 Complexity: simple   Destruction method: cryotherapy   Informed consent: discussed and consent obtained   Timeout:  patient name, date of birth, surgical site, and procedure verified Lesion destroyed using liquid nitrogen: Yes   Region frozen until ice ball extended beyond lesion: Yes   Outcome: patient tolerated procedure well with no complications   Post-procedure details: wound care instructions given    Viral warts, unspecified type L popliteal x 1  Viral Wart (HPV) Counseling vs ISK - Symptomatic, irritating, patient would like treated.  Discussed viral / HPV (Human Papilloma Virus) etiology and risk of spread /infectivity to other areas of body as well as to other people.  Multiple treatments and methods may be required to clear warts and it is possible treatment may not be successful.  Treatment risks include discoloration; scarring and there is still potential for wart  recurrence.  Destruction of lesion - L popliteal x 1 Complexity: simple   Destruction method: cryotherapy   Informed consent: discussed and consent obtained   Timeout:  patient name, date of birth, surgical site, and procedure verified Lesion destroyed using liquid nitrogen: Yes   Region frozen until ice ball extended beyond lesion: Yes   Outcome: patient tolerated procedure well with no complications   Post-procedure details: wound care instructions given    HISTORY OF SKIN CANCER - L med eyelid, R hand, unknown type, treated at Dr. Durene Cal office.  - Clear. Observe for recurrence.  - Call clinic for new or changing lesions.   - Recommend regular skin exams, daily broad-spectrum spf 30+ sunscreen use, and photoprotection.     Return in about 1 year (around 09/22/2024) for TBSE and psoriasis follow up.  Maylene Roes, CMA, am acting as scribe for Armida Sans, MD .  Documentation: I have reviewed the above documentation for accuracy and completeness, and I agree with the above.  Armida Sans, MD

## 2023-09-26 ENCOUNTER — Ambulatory Visit: Payer: BC Managed Care – PPO | Admitting: Dermatology

## 2023-09-30 ENCOUNTER — Encounter: Payer: Self-pay | Admitting: Dermatology

## 2023-09-30 NOTE — Telephone Encounter (Signed)
Please send urology referral to [facility] per patient request.

## 2023-10-28 ENCOUNTER — Other Ambulatory Visit: Payer: Self-pay | Admitting: Internal Medicine

## 2023-10-28 DIAGNOSIS — Z1231 Encounter for screening mammogram for malignant neoplasm of breast: Secondary | ICD-10-CM

## 2024-02-17 ENCOUNTER — Telehealth: Payer: Self-pay | Admitting: Orthopedic Surgery

## 2024-02-17 NOTE — Telephone Encounter (Signed)
 I cannot order an MRI when I have not seen her in 6 months. She would need to be seen for evaluation.   She can try following up with PCP if they are closer to her. They may be able to order an MRI.

## 2024-02-17 NOTE — Telephone Encounter (Signed)
 I spoke to [name].   I again advise she go to the emergency room if she has ongoing concerns. She refuses again with concerns of cost due to her financial situation.   I explained that we cannot order scans since we haven't seen her in greater then 6 months.   She will call her PCP to see if they will see her in the interim and order any scans.  She plans to see [place] next week for further evaluation.

## 2024-02-17 NOTE — Telephone Encounter (Signed)
 Patient is calling to let our office know that she is having fecal incontinence and has no idea that this is happening. She states that she went all day yesterday with this happening and had no indication she had even gone. She states that she has had urinary incontinence for a while when her back hurts. She states that she has been seeing urology and gastro for these issues and that her urologist wants to try Botox to potentially help with the urinary incontinence and her gastro provider wants to do a colonoscopy. She is concerned that these issues are coming from her back and she would like to know what she needs to do. Please advise.

## 2024-02-17 NOTE — Telephone Encounter (Signed)
 Previous imaging did not show any spinal cause of incontinence. If symptoms are getting worse, she can ago to ED for evaluation.

## 2024-02-17 NOTE — Telephone Encounter (Signed)
 Patient notes decrease in sensation in her genitals and increasing fecal incontinence noting that " She does not know when she is going on herself". She states she also had loss of sensation in the abdomen. She states that her genital numbness is intermittent in nature.   She has had issues with urinary incontinence and fecal incontinence for over a year but notes worsening symptoms.   She is having acute symptoms that may warrant ED visit for saddle anesthesia and new fecal incontinence symptoms.   I will check with Drake Leach PA if she advises immediate ED given the intermittent nature of her symptoms.

## 2024-02-17 NOTE — Telephone Encounter (Signed)
 She can come to see me today. I have some openings this afternoon. If she can't do that and is still concerned then she can go to ED.

## 2024-02-17 NOTE — Telephone Encounter (Signed)
 She asked her when it started. She states it worsened over 2-3 months since January.   Increasing back pain 10/10 in the last month.   Using tens unit and states her mobility has declined over time.   She has neuropathy in the feet and ankles which she reports numbness. She reports tingling in the legs which she has had with her neuropathic pain.   Inner thigh numbness, genitals and buttocks she noticed over the weekend. Unable to tell me if her inner thigh numbess is intermittent or constant.   Denies new weakness over the weekend.

## 2024-02-21 NOTE — Progress Notes (Addendum)
 Referring Physician:  No referring provider defined for this encounter.  Primary Physician:  Rex Castor, MD  History of Present Illness:  DOS: 02/09/22 C5-6 ACDF for cervical myelopathy  She has a history of psoriatic arthritis, migraines, cervical CA, and chronic pain syndrome.   Last seen by me in clinic on 07/29/23. She had phone visit with me on 09/03/23.   Lumbar MRI on 08/14/23 showed facet hypertrophy and lumbar spondylosis with mild left foraminal stenosis L3-L4. Thoracic MRI  on 08/30/23 showed mild thoracic spondylosis with no compression.   She has chronic frequency of her bowels/bladder for over a year now. She was referred to urology and GI.   She was also referred to pain management for medical management in [facility].   She called last week with increased severe LBP with intermittent numbness in her inner thighs. She was advised to go to the ED and went [place] to home.   [place]. She is scheduled for a SCS on 04/22/24.   She has constant LBP with constant bilateral medial leg pain to her feet. Her feet are numb from ankles into her feet- has known neuropathy in both feet. No weakness in her legs. She has intermittent numbness in her medial thighs.   She has chronic frequency of her bowels/bladder for over a year. She feels urge to void and when she gets up, she notes some leaking. She does not always make it to the bathroom in time. She has intermittent episodes of perineal numbness. She notes a few episodes of bowel incontinence- woke up with BM and didn't know she had to go.   She has chronic weakness in right arm since her cervical surgery. She notes increasing problems with hand dexterity and worsening balance. She is falling more frequently.   She does not smoke. Quit in 2008.   Conservative measures:  Physical therapy: has not helped in the past.  Multimodal medical therapy including regular  antiinflammatories: none  Injections:  Bilateral SI joint injections and bilateral piriformis TPI  07/08/23 L3-L4 IL ESI 05/20/23 Bilateral MBB L2, L3, L4, L5  on 02/25/23  Past Surgery: ACDF C5-C6 02/09/22.    Review of Systems:  A 10 point review of systems is negative, except for the pertinent positives and negatives detailed in the HPI.  Past Medical History: Past Medical History:  Diagnosis Date   Abnormal weight gain 08/05/2013   Absence of interventricular septum 08/05/2013   Allergic rhinitis 04/17/2016   Allergic urticaria due to ingested food 02/09/2016   Anxiety    Asthma    Cancer (HCC)    melanoma-left eye and right hand   Clinical depression 09/30/2013   Complication of anesthesia    PT HAD TROUBLE BREATHING AFTER HYSTERECTOMY IN OCTOBER   Coronary artery disease 06/25/2023   Depression    Dizziness    Excess weight 09/30/2013   GERD (gastroesophageal reflux disease)    Headache, migraine 09/30/2013   Heart murmur    Heavy drinker 04/11/2014   Overview:  Patient reports drinking 2-3 bottles of wine each weekend.     History of methicillin resistant staphylococcus aureus (MRSA) 11/2015   Hypothyroidism    Infection of urinary tract 09/30/2013   Irregular bleeding 09/30/2013   Multiple allergies    Pneumonia    Pre-diabetes    VSD (ventricular septal defect and aortic arch hypoplasia     Past Surgical History: Past Surgical History:  Procedure Laterality Date   ANTERIOR CERVICAL DECOMP/DISCECTOMY FUSION N/A 02/09/2022   Procedure: C5-6 ANTERIOR CERVICAL DISCECTOMY AND FUSION (GLOBUS HEDRON);  Surgeon: Jodeen Munch, MD;  Location: [facility] ORS;  Service: Neurosurgery;  Laterality: N/A;   BLEPHAROPLASTY  2019   BOTOX INJECTION N/A 10/23/2016   Procedure: BOTOX INJECTION;  Surgeon: Rea Cambridge, MD;  Location: [facility] OR[facility];  Service: Urology;  Laterality: N/A;   BREAST BIOPSY Left 11/13/2013   benign   CERVICAL FUSION  2023   CESAREAN SECTION     x2    COLONOSCOPY  2006   COLONOSCOPY WITH PROPOFOL N/A 07/04/2020   Procedure: COLONOSCOPY WITH PROPOFOL;  Surgeon: Shane Darling, MD;  Location: [facility];  Service: Endoscopy;  Laterality: N/A;   COMBINED REDUCTION MAMMAPLASTY W/ ABDOMINOPLASTY Bilateral 05/2021   CORONARY ANGIOPLASTY  2013   COSMETIC SURGERY  06/09/2021   tummy tuck   CYSTOSCOPY N/A 10/23/2016   Procedure: CYSTOSCOPY;  Surgeon: Rea Cambridge, MD;  Location: [facility] OR[facility];  Service: Urology;  Laterality: N/A;   CYSTOSCOPY WITH HYDRODISTENSION AND BIOPSY     ESOPHAGOGASTRODUODENOSCOPY     KNEE SURGERY Right 03/20/2023   [facility], [place], [place]   LAPAROSCOPIC ASSISTED VAGINAL HYSTERECTOMY N/A 08/20/2016   Procedure: LAPAROSCOPIC ASSISTED VAGINAL HYSTERECTOMY;  Surgeon: Carolynn Citrin, MD;  Location: [facility] OR[facility];  Service: Gynecology;  Laterality: N/A;   LAPAROSCOPIC BILATERAL SALPINGECTOMY Bilateral 08/20/2016   Procedure: LAPAROSCOPIC BILATERAL SALPINGECTOMY;  Surgeon: Carolynn Citrin, MD;  Location: [facility] OR[facility];  Service: Gynecology;  Laterality: Bilateral;   NASAL SINUS SURGERY     x12   REDUCTION MAMMAPLASTY Bilateral 06/11/2021   TONSILLECTOMY     TUBAL LIGATION  2003    Allergies: Allergies as of 02/26/2024 - Review Complete 09/30/2023  Allergen Reaction Noted   Bee pollen Anaphylaxis 04/17/2016   Bee venom Anaphylaxis 04/17/2016   Egg-derived products Anaphylaxis 08/06/2015   Keflex [cephalexin] Anaphylaxis 08/06/2015   Milk (cow) Anaphylaxis    Milk-related compounds Anaphylaxis 08/06/2015   Penicillins Anaphylaxis 08/06/2015   Red dye #40 (allura red) Shortness Of Breath 06/17/2018   Shellfish allergy Anaphylaxis and Other (See Comments) 04/17/2016   Soy allergy (obsolete) Anaphylaxis 08/06/2015   Tilactase Anaphylaxis and Other (See Comments) 04/17/2016   Topamax [topiramate]  08/29/2020   Wheat Anaphylaxis 04/30/2016   Yellow dyes (non-tartrazine) Shortness Of Breath 01/30/2022    Ferrous gluconate Diarrhea and Nausea And Vomiting 08/05/2013   Gabapentin Other (See Comments) 11/03/2019   Hydrocodone Hives 04/17/2016   Iron Diarrhea 04/17/2016   Butalbital-apap-caffeine Other (See Comments) 02/04/2019   Codeine Rash 04/17/2016   Iodine Rash 08/20/2016   Latex Itching and Rash 04/17/2016   Meclizine Other (See Comments) 08/29/2020   Robaxin [methocarbamol] Other (See Comments) 02/09/2022    Medications: Outpatient Encounter Medications as of 02/26/2024  Medication Sig   albuterol (PROVENTIL HFA;VENTOLIN HFA) 108 (90 Base) MCG/ACT inhaler Inhale 2 puffs into the lungs every 6 (six) hours as needed for wheezing or shortness of breath.   ALPRAZolam (XANAX) 0.5 MG tablet Take 1 tablet (0.5 mg total) by mouth as directed. Take it only once a day as needed for severe panic symptoms   azelastine (OPTIVAR) 0.05 % ophthalmic solution Place 1 drop into both eyes daily as needed (matted eyes in the morning).   cetirizine (ZYRTEC) 10 MG tablet Take 20 mg by mouth 2 (two) times daily.   CRANBERRY PO Take 1 capsule by mouth at bedtime.   desvenlafaxine (PRISTIQ) 100 MG 24 hr  tablet Take 1 tablet (100 mg total) by mouth daily.   divalproex (DEPAKOTE) 250 MG DR tablet Take 250 mg by mouth 2 (two) times daily.   EPINEPHrine 0.3 mg/0.3 mL IJ SOAJ injection Inject 0.3 mLs (0.3 mg total) into the muscle once.   Guselkumab (TREMFYA [place]) Inject into the skin.   hydrochlorothiazide (HYDRODIURIL) 25 MG tablet Take 25 mg by mouth daily.   levocetirizine (XYZAL) 5 MG tablet Take 10 mg by mouth 2 (two) times daily.   levothyroxine (SYNTHROID, LEVOTHROID) 50 MCG tablet Take 50-100 mcg by mouth See admin instructions. Take 100 mcg by mouth every other day, alternating with 50 mcg on alternate days.   lurasidone (LATUDA) 40 MG TABS tablet Take 60 mg by mouth daily with breakfast.   montelukast (SINGULAIR) 10 MG tablet Take 10 mg by mouth at bedtime.    MULTIPLE VITAMIN PO Take 1 tablet by mouth  daily.   nitrofurantoin, macrocrystal-monohydrate, (MACROBID) 100 MG capsule Take 100 mg by mouth daily as needed (after being in water).   omalizumab (XOLAIR) 150 MG injection Inject 300 mg into the skin every 21 ( twenty-one) days.   ondansetron (ZOFRAN-ODT) 4 MG disintegrating tablet Take 1 tablet (4 mg total) by mouth every 8 (eight) hours as needed for nausea or vomiting.   polyethylene glycol (MIRALAX) 17 g packet Take 17 g by mouth daily.   Ubrogepant (UBRELVY) 100 MG TABS Take 100 mg by mouth 2 (two) times daily.   vitamin B-12 (CYANOCOBALAMIN) 1000 MCG tablet Take 1,000 mcg by mouth daily.   vitamin C (ASCORBIC ACID) 500 MG tablet Take 500 mg by mouth at bedtime.   No facility-administered encounter medications on file as of 02/26/2024.    Social History: Social History   Tobacco Use   Smoking status: Former    Current packs/day: 0.00    Types: Cigarettes    Quit date: 11/12/2006    Years since quitting: 17.2   Smokeless tobacco: Never   Tobacco comments:    quit 2008  Vaping Use   Vaping status: Never Used  Substance Use Topics   Alcohol use: No    Alcohol/week: 0.0 standard drinks of alcohol   Drug use: No    Family Medical History: Family History  Problem Relation Age of Onset   Breast cancer Maternal Aunt    Breast cancer Paternal Aunt    Breast cancer Maternal Grandmother    Breast cancer Paternal Grandmother    Breast cancer Paternal Aunt    Bipolar disorder Paternal Aunt    Kidney cancer Maternal Uncle    Prostate cancer Paternal Grandfather    Kidney disease Other        father's side   Aneurysm Mother    Liver disease Mother    Drug abuse Mother    Alcohol abuse Mother    Anxiety disorder Mother    Depression Mother    Cancer Father    Anxiety disorder Father    Depression Father     Physical Examination: There were no vitals filed for this visit.    Awake, alert, oriented to person, place, and time.  Speech is clear and fluent. Fund of  knowledge is appropriate.   Cranial Nerves: Pupils equal round and reactive to light.  Facial tone is symmetric.    No abnormal lesions on exposed skin.   Strength: Side Biceps Triceps Deltoid Interossei Grip Wrist Ext. Wrist Flex.  R 4+ 5 4+ 5 4+ 4 4  L 5 5  5 5 5 5 5    Side Iliopsoas Quads Hamstring PF DF EHL  R 5 5 5 5  4+ 4+  L 5 5 5 5  4+ 4+   Reflexes are 2+ and symmetric at the biceps, triceps, brachioradialis, patella and achilles.     She has difficulty heel standing on both legs.   Hoffman's is present on left, negative on right.  Clonus is not present.    Bilateral upper and lower extremity sensation is diminished diffusely per patient. Worse in lower extremities from knees down.   Slow gait.   Medical Decision Making  Imaging: MRI of lumbar spine dated 02/17/24:  IMPRESSION:  Overall mild degenerative change.  SIGNATURE:  Electronically signed By:  Maxie Spaniel On: 02/18/24   I have personally reviewed the images and agree with the above interpretation.  Above MRI report scanned into chart.   Assessment and Plan: [name] has constant LBP with constant bilateral medial leg pain to her feet. Her feet are numb from ankles into her feet- has known neuropathy in both feet. No weakness in her legs. She has intermittent numbness in her medial thighs.   She has known facet hypertrophy and lumbar spondylosis with mild bilateral foraminal stenosis L3-L4 and mild right foraminal stenosis L4-L5.   She has known neuropathy in her legs and both feet are numb. She is scheduled SCS trial on 04/22/24.   She continues with chronic bowel/bladder issues for over a year. She feels urge to void and when she gets up, she notes some leaking. She does not always make it to the bathroom in time. She has intermittent episodes of perineal numbness. She notes a few episodes of bowel incontinence- woke up with BM and didn't know she had to go.   She has chronic weakness in right arm since  her cervical surgery. She notes increasing problems with hand dexterity and worsening balance. She is falling more frequently.   She has new weakness of right arm on exam compared to last visit- weakness with grip, biceps, wrist ext/flex. She has positive hoffmans on left.   Treatment options discussed with patient and following plan made:   - She should continue to follow up with urology and GI regarding bowel/bladder issues.  - I agrees with SCS trial for her lumbar spine. She has some slight weakness in both feet with DF/EHL. May consider EMG/NCS of lower extremities. May consider EMG of bilateral lower extremities. Will send her a message.  - She has some new weakness in right arm since last visit. She also notes worsening dexterity and balance issues. She has been falling more frequently. Will get cervical MRI at [facility] in [place].  - Cervical xrays with flex/ext ordered at [facility] in [place].  - She will let me know when above imaging is done and we will schedule phone visit to review it.   I spent a total of 25 minutes in face-to-face and non-face-to-face activities related to this patient's care today including review of outside records, review of imaging, review of symptoms, physical exam, discussion of differential diagnosis, discussion of treatment options, and documentation.   Lucetta Russel PA-C Dept. of Neurosurgery

## 2024-02-26 ENCOUNTER — Inpatient Hospital Stay
Admission: RE | Admit: 2024-02-26 | Discharge: 2024-02-26 | Disposition: A | Discharge status: Home / Self Care | Payer: Self-pay | Source: Ambulatory Visit | Attending: Orthopedic Surgery | Admitting: Orthopedic Surgery

## 2024-02-26 ENCOUNTER — Ambulatory Visit: Payer: Self-pay | Admitting: Orthopedic Surgery

## 2024-02-26 ENCOUNTER — Encounter: Payer: Self-pay | Admitting: Orthopedic Surgery

## 2024-02-26 ENCOUNTER — Other Ambulatory Visit: Payer: Self-pay

## 2024-02-26 VITALS — BP 110/72 | Ht 62.0 in | Wt 182.0 lb

## 2024-02-26 DIAGNOSIS — R2689 Other abnormalities of gait and mobility: Secondary | ICD-10-CM

## 2024-02-26 DIAGNOSIS — M47816 Spondylosis without myelopathy or radiculopathy, lumbar region: Secondary | ICD-10-CM

## 2024-02-26 DIAGNOSIS — R29898 Other symptoms and signs involving the musculoskeletal system: Secondary | ICD-10-CM

## 2024-02-26 DIAGNOSIS — M48061 Spinal stenosis, lumbar region without neurogenic claudication: Secondary | ICD-10-CM

## 2024-02-26 DIAGNOSIS — M4726 Other spondylosis with radiculopathy, lumbar region: Secondary | ICD-10-CM | POA: Diagnosis not present

## 2024-02-26 DIAGNOSIS — Z981 Arthrodesis status: Secondary | ICD-10-CM | POA: Diagnosis not present

## 2024-02-26 DIAGNOSIS — G629 Polyneuropathy, unspecified: Secondary | ICD-10-CM

## 2024-02-26 DIAGNOSIS — N3946 Mixed incontinence: Secondary | ICD-10-CM

## 2024-02-26 DIAGNOSIS — Z049 Encounter for examination and observation for unspecified reason: Secondary | ICD-10-CM

## 2024-02-26 DIAGNOSIS — M5416 Radiculopathy, lumbar region: Secondary | ICD-10-CM

## 2024-02-26 NOTE — Patient Instructions (Addendum)
 It was so nice to see you today. Thank you so much for coming in.    Your lumbar MRI shows some wear and tear. I agree with spinal cord stimulator trial. Follow up with urology and GI for your bowel/bladder issues. The urologist may think you have a neurogenic bladder.   You have some new weakness in your right arm since your last visit. I want to get an MRI of your neck to look into things further. We will get this approved through your insurance and [facility] in [place] will call you to schedule the appointment. Ask about your patient responsibility. You do not need to pay this prior to getting MRI, they can bill you.   I want to get updated cervical xrays when you get the MRI. Let them know so they can do these as well. Will send to [facility] in [place].   Call or message me when cervical MRI is done so I can get the results. Can set up phone visit to review them.   Please do not hesitate to call if you have any questions or concerns. You can also message me in MyChart.   Lucetta Russel PA-C [phone]     The physicians and staff at [facility] at [facility] are committed to providing excellent care. You may receive a survey asking for feedback about your experience at our office. We value you your feedback and appreciate you taking the time to to fill it out. The [facility] leadership team is also available to discuss your experience in person, feel free to contact us  [phone].

## 2024-02-27 ENCOUNTER — Telehealth: Payer: Self-pay | Admitting: Orthopedic Surgery

## 2024-02-27 NOTE — Addendum Note (Signed)
 Addended byLucetta Russel on: 02/27/2024 04:53 PM   Modules accepted: Orders

## 2024-02-27 NOTE — Telephone Encounter (Signed)
 Patient calling that the MRI order should have gone to [facility] fax [phone].

## 2024-02-27 NOTE — Telephone Encounter (Signed)
 Patient will given them a call now.

## 2024-02-27 NOTE — Telephone Encounter (Signed)
 I put in order for EMG/NCS of bilateral lower extremities. She wants this done at [facility] [place]   I think [facility] in [place] may work. Please call her to find out and then send orders. Please let patient know where we are sending orders and give her their phone number.   If orders need signed- see if Diego Foy or Ruthann Cover will do them since I will be out.   Thanks.

## 2024-03-02 NOTE — Telephone Encounter (Signed)
 EMG referral faxed to [facility] in [place] per patient's request.  Fax: [phone]

## 2024-03-06 NOTE — Telephone Encounter (Signed)
 Her cervical xray and MRI are in from [place]. Can you request the images please.

## 2024-03-11 ENCOUNTER — Inpatient Hospital Stay
Admission: RE | Admit: 2024-03-11 | Discharge: 2024-03-11 | Disposition: A | Discharge status: Home / Self Care | Payer: Self-pay | Source: Ambulatory Visit | Attending: Orthopedic Surgery | Admitting: Orthopedic Surgery

## 2024-03-11 ENCOUNTER — Other Ambulatory Visit: Payer: Self-pay | Admitting: Family Medicine

## 2024-03-11 DIAGNOSIS — Z049 Encounter for examination and observation for unspecified reason: Secondary | ICD-10-CM

## 2024-03-11 NOTE — Telephone Encounter (Signed)
 Please schedule phone visit with me to review her cervical MRI and xrays.   Saw she as neurology appt on 8/26- is that the earliest she could get the EMG?

## 2024-03-11 NOTE — Telephone Encounter (Signed)
 Patient scheduled for Monday 5.5.25 at 8:00AM.  Patient states she is having her EMG on that date, I offered to find another office she declined the offer

## 2024-03-13 NOTE — Progress Notes (Unsigned)
 Telephone Visit- Progress Note: Referring Physician:  Rex Castor, MD [address] [place],  [place] [address]  Primary Physician:  Rex Castor, MD  This visit was performed via telephone.  Patient location: home Provider location: office  I spent a total of 15 minutes non-face-to-face activities for this visit on the date of this encounter including review of current clinical condition and response to treatment.    Patient has given verbal consent to this telephone visits and we reviewed the limitations of a telephone visit. Patient wishes to proceed.    Chief Complaint:  review imaging  History of Present Illness: Kristine [name] is a 53 y.o. female has a history of  history of psoriatic arthritis, migraines, cervical CA, and chronic pain syndrome.   She is s/p ACDF C5-C6 for cervical myelopathy on 02/10/24.   She is about the same since her last visit. She has known facet hypertrophy and lumbar spondylosis with mild bilateral foraminal stenosis L3-L4 and mild right foraminal stenosis L4-L5.    She has known neuropathy in her legs and both feet are numb. She is scheduled SCS trial on 04/22/24.   EMG of lower extremities ordered to evaluate new weakness in both feet. This is scheduled for 07/07/24.   She had colonoscopy done. She had botox  in her bladder on Friday- no change. She saw ortho spine surgeon at [facility] and he agreed with SCS.   She has constant neck and bilateral shoulder pain. No radicular arm pain. She has chronic right arm weakness since her surgery, but it was worse at her last visit. She also noted increased issues with hand dexterity and balance.   Phone visit scheduled to review cervical imaging.   She does not smoke. Quit in 2008.    Conservative measures:  Physical therapy: has not helped in the past.  Multimodal medical therapy including regular antiinflammatories: none  Injections:  Bilateral SI joint injections and bilateral  piriformis TPI  07/08/23 L3-L4 IL ESI 05/20/23 Bilateral MBB L2, L3, L4, L5  on 02/25/23   Past Surgery: ACDF C5-C6 02/09/22.      Exam: No exam done as this was a telephone encounter.     Imaging: Cervical xrays dated 03/06/24:  FINDINGS:   Prior ACDF at C5-6. Normal alignment. No hardware complication   No dynamic instability.   Mild degenerative disc disease C3-4, C4-5 and C6-7   Prevertebral soft tissues are normal    IMPRESSION:   PRIOR ACDF AT C5-6. NO HARDWARE COMPLICATION   NORMAL ALIGNMENT. NO DYNAMIC INSTABILITY   Electronically Signed by: Lacinda Pica. Fisher on 03/06/2024 8:32 AM    Cervical MRI dated 03/06/24:  FINDINGS:   The imaged posterior fossa is normal.   Cervical cord is normal.   No acute fractures, subluxations or destructive osseous lesions are present.   C2-3, C3-4 and C4-5: Normal.   C5-6: Interval ACDF with anterior plate, screws and interbody spacer. No spinal stenosis. Mild to moderate bilateral neural foraminal stenosis is redemonstrated.   C6-7 and C7-T1: Normal.   Imaged upper thoracic spine is normal.    IMPRESSION:   C5-6 ACDF WITHOUT COMPLICATIONS. NO SPINAL STENOSIS. MILD TO MODERATE BILATERAL NEURAL FORAMINAL STENOSIS. REMAINDER OF THE CERVICAL SPINE IS NORMAL.   Electronically Signed by: Sandip J. Patel on 03/06/2024 8:29 AM   I have personally reviewed the images and agree with the above interpretation.   Above cervical MRI reviewed with Dr. Mont Antis prior to her visit.   Assessment and Plan:  [name] has constant neck and bilateral shoulder pain. No radicular arm pain. She has chronic right arm weakness since her surgery, but it was worse at her last visit. She also noted increased issues with hand dexterity and balance.   She is s/p ACDF C5-C6 for cervical myelopathy on 02/10/24.   Above cervical imaging looks good with no significant spinal stenosis.   At last visit, she had new weakness of right arm on exam compared  to previous visit- weakness with grip, biceps, wrist ext/flex. She had positive hoffmans on left.    Treatment options discussed with patient and following plan made:   - Do not see cervical cause of right arm weakness or dexterity/balance issues.  - She is scheduled for lower extremity EMG with [facility] in [place] on 07/07/24.  - Will add orders for bilateral upper extremity EMG and also formal consultation to neurology.  - She is scheduled for SCS trial on 04/22/24.  - Will follow up with her after evaluation with neurology. Will check her chart in September and then likely schedule a follow up.   Lucetta Russel PA-C Neurosurgery

## 2024-03-16 ENCOUNTER — Ambulatory Visit (INDEPENDENT_AMBULATORY_CARE_PROVIDER_SITE_OTHER): Admitting: Orthopedic Surgery

## 2024-03-16 ENCOUNTER — Encounter: Payer: Self-pay | Admitting: Orthopedic Surgery

## 2024-03-16 DIAGNOSIS — R2689 Other abnormalities of gait and mobility: Secondary | ICD-10-CM | POA: Diagnosis not present

## 2024-03-16 DIAGNOSIS — M25511 Pain in right shoulder: Secondary | ICD-10-CM

## 2024-03-16 DIAGNOSIS — M542 Cervicalgia: Secondary | ICD-10-CM

## 2024-03-16 DIAGNOSIS — R29898 Other symptoms and signs involving the musculoskeletal system: Secondary | ICD-10-CM | POA: Diagnosis not present

## 2024-03-16 DIAGNOSIS — Z981 Arthrodesis status: Secondary | ICD-10-CM

## 2024-03-16 DIAGNOSIS — M25512 Pain in left shoulder: Secondary | ICD-10-CM | POA: Diagnosis not present

## 2024-08-16 ENCOUNTER — Encounter: Payer: Self-pay | Admitting: Orthopedic Surgery

## 2024-09-23 ENCOUNTER — Ambulatory Visit: Payer: BC Managed Care – PPO | Admitting: Dermatology

## 2024-12-11 NOTE — Telephone Encounter (Signed)
 Please schedule phone or MyChart visit with me to regroup with her and discuss her EMG results.

## 2024-12-18 ENCOUNTER — Telehealth: Admitting: Orthopedic Surgery

## 2024-12-25 ENCOUNTER — Telehealth: Admitting: Orthopedic Surgery
